# Patient Record
Sex: Female | Born: 1965 | Hispanic: No | Marital: Married | Smoking: Never smoker
Health system: Southern US, Community
[De-identification: ages and names within clinical notes are randomized; demographics above are authoritative.]

## PROBLEM LIST (undated history)

## (undated) DIAGNOSIS — B019 Varicella without complication: Secondary | ICD-10-CM

## (undated) DIAGNOSIS — T7840XA Allergy, unspecified, initial encounter: Secondary | ICD-10-CM

## (undated) DIAGNOSIS — G43909 Migraine, unspecified, not intractable, without status migrainosus: Secondary | ICD-10-CM

## (undated) DIAGNOSIS — R7303 Prediabetes: Secondary | ICD-10-CM

## (undated) DIAGNOSIS — Z9289 Personal history of other medical treatment: Secondary | ICD-10-CM

## (undated) DIAGNOSIS — E785 Hyperlipidemia, unspecified: Secondary | ICD-10-CM

## (undated) DIAGNOSIS — E039 Hypothyroidism, unspecified: Secondary | ICD-10-CM

## (undated) DIAGNOSIS — I10 Essential (primary) hypertension: Secondary | ICD-10-CM

## (undated) DIAGNOSIS — D649 Anemia, unspecified: Secondary | ICD-10-CM

## (undated) DIAGNOSIS — E079 Disorder of thyroid, unspecified: Secondary | ICD-10-CM

## (undated) HISTORY — DX: Migraine, unspecified, not intractable, without status migrainosus: G43.909

## (undated) HISTORY — DX: Hyperlipidemia, unspecified: E78.5

## (undated) HISTORY — DX: Allergy, unspecified, initial encounter: T78.40XA

## (undated) HISTORY — PX: TUBAL LIGATION: SHX77

## (undated) HISTORY — DX: Personal history of other medical treatment: Z92.89

## (undated) HISTORY — DX: Varicella without complication: B01.9

## (undated) HISTORY — PX: PORT-A-CATH REMOVAL: SHX5289

## (undated) HISTORY — DX: Disorder of thyroid, unspecified: E07.9

## (undated) HISTORY — PX: HERNIA REPAIR: SHX51

---

## 2002-01-11 ENCOUNTER — Other Ambulatory Visit: Admission: RE | Admit: 2002-01-11 | Discharge: 2002-01-11 | Payer: Self-pay | Admitting: Internal Medicine

## 2004-12-22 ENCOUNTER — Other Ambulatory Visit: Admission: RE | Admit: 2004-12-22 | Discharge: 2004-12-22 | Payer: Self-pay | Admitting: Internal Medicine

## 2010-10-01 HISTORY — PX: OTHER SURGICAL HISTORY: SHX169

## 2010-10-01 HISTORY — PX: ABDOMINOPLASTY: SUR9

## 2010-11-03 DIAGNOSIS — K429 Umbilical hernia without obstruction or gangrene: Secondary | ICD-10-CM

## 2010-11-13 ENCOUNTER — Encounter (INDEPENDENT_AMBULATORY_CARE_PROVIDER_SITE_OTHER): Payer: Self-pay | Admitting: Surgery

## 2010-11-17 ENCOUNTER — Encounter (INDEPENDENT_AMBULATORY_CARE_PROVIDER_SITE_OTHER): Payer: 59 | Admitting: Surgery

## 2010-11-19 ENCOUNTER — Ambulatory Visit (INDEPENDENT_AMBULATORY_CARE_PROVIDER_SITE_OTHER): Payer: 59 | Admitting: Surgery

## 2010-11-19 ENCOUNTER — Encounter (INDEPENDENT_AMBULATORY_CARE_PROVIDER_SITE_OTHER): Payer: Self-pay | Admitting: Surgery

## 2010-11-19 VITALS — BP 128/80 | HR 68

## 2010-11-19 DIAGNOSIS — K429 Umbilical hernia without obstruction or gangrene: Secondary | ICD-10-CM

## 2010-11-19 NOTE — Progress Notes (Signed)
Visit Diagnoses: 1. Umbilical hernia     HISTORY: Patient returns for followup having undergone primary repair of umbilical hernia at the time of abdominoplasty   EXAM: Surgical wounds appear to be healing without complication. There is mild soft tissue edema. There is no sign of cellulitis. With Valsalva in a standing and the recumbent position there is no sign of recurrent hernia.   IMPRESSION: Status post primary repair of umbilical hernia concurrent with abdominoplasty   PLAN: Patient will remain out of work for one additional week. She has a followup appointment scheduled with her plastic surgeon on November 25, 2010. At that time he will determine the date of return to work.  Patient will return to see me as needed   Velora Heckler, MD, FACS General & Endocrine Surgery [facility]

## 2010-11-19 NOTE — Patient Instructions (Signed)
Remain out of work until re-assessed by Dr. Benna Dunks on September 18th.  TMG

## 2011-01-01 ENCOUNTER — Encounter (INDEPENDENT_AMBULATORY_CARE_PROVIDER_SITE_OTHER): Payer: Self-pay

## 2011-01-01 ENCOUNTER — Telehealth (INDEPENDENT_AMBULATORY_CARE_PROVIDER_SITE_OTHER): Payer: Self-pay

## 2011-01-01 NOTE — Telephone Encounter (Signed)
Patient given RTW letter with no restrictions. Patient stated her  Plastic Surgeon has also approved RTW with  no restrictions.

## 2011-11-05 ENCOUNTER — Ambulatory Visit (INDEPENDENT_AMBULATORY_CARE_PROVIDER_SITE_OTHER): Payer: 59 | Admitting: Family Medicine

## 2011-11-05 ENCOUNTER — Encounter: Payer: Self-pay | Admitting: Family Medicine

## 2011-11-05 VITALS — BP 120/82 | HR 81 | Temp 98.1°F | Ht 59.5 in | Wt 109.2 lb

## 2011-11-05 DIAGNOSIS — F419 Anxiety disorder, unspecified: Secondary | ICD-10-CM | POA: Insufficient documentation

## 2011-11-05 DIAGNOSIS — F411 Generalized anxiety disorder: Secondary | ICD-10-CM

## 2011-11-05 DIAGNOSIS — R000 Tachycardia, unspecified: Secondary | ICD-10-CM | POA: Insufficient documentation

## 2011-11-05 DIAGNOSIS — G4700 Insomnia, unspecified: Secondary | ICD-10-CM

## 2011-11-05 DIAGNOSIS — E785 Hyperlipidemia, unspecified: Secondary | ICD-10-CM

## 2011-11-05 MED ORDER — SERTRALINE HCL 25 MG PO TABS: 25.0000 mg | ORAL_TABLET | Freq: Every day | ORAL | Status: DC

## 2011-11-05 NOTE — Progress Notes (Signed)
  Subjective:    Patient ID: Dana Kramer, female    DOB: December 23, 1965, 46 y.o.   MRN: [id number]  HPI New to establish.  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  Tachycardia- chronic problem, had normal cardiac w/u, normal lab work.  No obvious cause.  Was started on Metoprolol w/ good results.  Hyperlipidemia- pt w/ hx of elevated cholesterol readings.  Is controlling w/ diet and exercise, fish oil.  Now exercising 4x/week for 2 hrs at a time.  Insomnia- father passed away 1 yr ago from esophageal cancer.  Pt admits to increased stress levels.  When she lies down at night thoughts will start race.  'unable to turn off my brain'.  Previously took [place] but was unable to sleep soundly through the night.  Took Ambien but fears dependency on med.   Review of Systems For ROS see HPI     Objective:   Physical Exam  Vitals reviewed. Constitutional: She is oriented to person, place, and time. She appears well-developed and well-nourished. No distress.  HENT:  Head: Normocephalic and atraumatic.  Eyes: Conjunctivae and EOM are normal. Pupils are equal, round, and reactive to light.  Neck: Normal range of motion. Neck supple. No thyromegaly present.  Cardiovascular: Normal rate, regular rhythm, normal heart sounds and intact distal pulses.   No murmur heard. Pulmonary/Chest: Effort normal and breath sounds normal. No respiratory distress.  Abdominal: Soft. She exhibits no distension. There is no tenderness.  Musculoskeletal: She exhibits no edema.  Lymphadenopathy:    She has no cervical adenopathy.  Neurological: She is alert and oriented to person, place, and time.  Skin: Skin is warm and dry.  Psychiatric: She has a normal mood and affect. Her behavior is normal.          Assessment & Plan:

## 2011-11-05 NOTE — Assessment & Plan Note (Signed)
New to provider.  Pt is controlling w/ healthy diet, regular exercise, and fish oil.  Will draw labs at upcoming CPE.

## 2011-11-05 NOTE — Assessment & Plan Note (Signed)
New to provider, ongoing for pt.  Well controlled on metoprolol.  Currently asymptomatic.  No changes.

## 2011-11-05 NOTE — Assessment & Plan Note (Signed)
New.  Pt reports this is ongoing issue for her.  Fearful of taking meds but realizes she needs something due to insomnia.  Will start low dose Zoloft and follow closely.  Pt expressed understanding and is in agreement w/ plan.

## 2011-11-05 NOTE — Assessment & Plan Note (Signed)
New.  Ongoing problem for pt.  Pt did well on Ambien but fears possibility of dependence.  Will start Zoloft to tx anxiety and ideally this will improve insomnia.  Will follow closely and adjust meds prn.

## 2011-11-05 NOTE — Patient Instructions (Addendum)
Schedule your complete physical in 4-6 weeks Start the Zoloft daily at dinner Call with any questions or concerns Think of [place] as your home base Welcome!  We're glad to have you!!!

## 2011-11-25 ENCOUNTER — Telehealth: Payer: Self-pay | Admitting: Family Medicine

## 2011-11-25 NOTE — Telephone Encounter (Signed)
refills mail order [facility] x 2 --- last ov 8.29.13 to [facility]  1-Sertraline HCl (Tab) ZOLOFT 25 MG Take 1 tablet (25 mg total) by mouth daily.  2-Metoprolol Tartrate (Tab) LOPRESSOR 12.5 mg Take by mouth daily.

## 2011-11-26 MED ORDER — METOPROLOL TARTRATE 12.5 MG HALF TABLET: 12.5000 mg | ORAL_TABLET | Freq: Every day | ORAL | Status: DC

## 2011-11-26 MED ORDER — SERTRALINE HCL 25 MG PO TABS: 25.0000 mg | ORAL_TABLET | Freq: Every day | ORAL | Status: DC

## 2011-11-26 NOTE — Telephone Encounter (Signed)
rx sent to pharmacy by e-script  

## 2011-12-01 ENCOUNTER — Telehealth: Payer: Self-pay | Admitting: Family Medicine

## 2011-12-01 NOTE — Telephone Encounter (Signed)
Pt states the pharmacy told her there is some confusion about one of her prescriptions and they were unable to contact us. Pt states we should [phone] and give verification # [id number].

## 2011-12-01 NOTE — Telephone Encounter (Signed)
Called pt to advise that a form has been received and that MD Beverely Low has been out of the office today, however the form is waiting on her desk to be completed and will be faxed when completed, pt understood

## 2011-12-02 NOTE — Telephone Encounter (Signed)
Pt noted she is taking 12.5mg  of metoprolol DAILY and also needs refills on her zoloft [facility], form given back to MD Tabori with verified information, will await MD Tabori notation

## 2011-12-04 NOTE — Telephone Encounter (Signed)
Pt states she needs her rx for metoprolol to be for 25mg  1/2 tablet daily. The pharmacy will not accept it written as 12.5mg .

## 2011-12-04 NOTE — Telephone Encounter (Signed)
Called pt to advise the corrected amount has been faxed to her pharmacy for the 25mg  to be given half tab, pt understood and noted that the zoloft has already been mailed according to mail order

## 2011-12-08 ENCOUNTER — Other Ambulatory Visit (HOSPITAL_COMMUNITY)
Admission: RE | Admit: 2011-12-08 | Discharge: 2011-12-08 | Disposition: A | Discharge status: Home / Self Care | Payer: 59 | Source: Ambulatory Visit | Attending: Family Medicine | Admitting: Family Medicine

## 2011-12-08 ENCOUNTER — Ambulatory Visit (INDEPENDENT_AMBULATORY_CARE_PROVIDER_SITE_OTHER): Payer: 59 | Admitting: Family Medicine

## 2011-12-08 ENCOUNTER — Encounter: Payer: Self-pay | Admitting: Family Medicine

## 2011-12-08 VITALS — BP 104/78 | HR 75 | Temp 97.8°F | Ht 59.5 in | Wt 109.0 lb

## 2011-12-08 DIAGNOSIS — Z01419 Encounter for gynecological examination (general) (routine) without abnormal findings: Secondary | ICD-10-CM | POA: Insufficient documentation

## 2011-12-08 DIAGNOSIS — E785 Hyperlipidemia, unspecified: Secondary | ICD-10-CM

## 2011-12-08 DIAGNOSIS — Z124 Encounter for screening for malignant neoplasm of cervix: Secondary | ICD-10-CM | POA: Insufficient documentation

## 2011-12-08 LAB — CBC WITH DIFFERENTIAL/PLATELET
Basophils Relative: 0.5 % (ref 0.0–3.0)
Eosinophils Relative: 0.9 % (ref 0.0–5.0)
Hemoglobin: 12.5 g/dL (ref 12.0–15.0)
Lymphocytes Relative: 39 % (ref 12.0–46.0)
MCHC: 33.1 g/dL (ref 30.0–36.0)
MCV: 87.8 fl (ref 78.0–100.0)
Neutro Abs: 3.1 10*3/uL (ref 1.4–7.7)
Neutrophils Relative %: 56.1 % (ref 43.0–77.0)
RBC: 4.3 Mil/uL (ref 3.87–5.11)
WBC: 5.5 10*3/uL (ref 4.5–10.5)

## 2011-12-08 LAB — BASIC METABOLIC PANEL
BUN: 16 mg/dL (ref 6–23)
Chloride: 102 mEq/L (ref 96–112)
Creatinine, Ser: 0.7 mg/dL (ref 0.4–1.2)
Glucose, Bld: 74 mg/dL (ref 70–99)
Potassium: 3.7 mEq/L (ref 3.5–5.1)

## 2011-12-08 LAB — HEPATIC FUNCTION PANEL
AST: 23 U/L (ref 0–37)
Alkaline Phosphatase: 52 U/L (ref 39–117)
Bilirubin, Direct: 0.1 mg/dL (ref 0.0–0.3)
Total Bilirubin: 0.7 mg/dL (ref 0.3–1.2)

## 2011-12-08 LAB — LIPID PANEL
HDL: 51.6 mg/dL (ref >39.00)
VLDL: 15 mg/dL (ref 0.0–40.0)

## 2011-12-08 LAB — TSH: TSH: 2.91 u[IU]/mL (ref 0.35–5.50)

## 2011-12-08 NOTE — Assessment & Plan Note (Signed)
Pt's PE WNL.  UTD on mammo.  Check labs.  Anticipatory guidance provided.  

## 2011-12-08 NOTE — Assessment & Plan Note (Signed)
Check labs.  Adjust meds prn  

## 2011-12-08 NOTE — Patient Instructions (Addendum)
Follow up in 6 months to recheck cholesterol We'll notify you of your lab results and make any changes if needed Keep up the good work!  You look great! Call with any questions or concerns Happy Belated Birthday! 

## 2011-12-08 NOTE — Progress Notes (Signed)
  Subjective:    Patient ID: Dana Kramer, female    DOB: October 09, 1965, 46 y.o.   MRN: [id number]  HPI CPE- no concerns today.   Review of Systems Patient reports no vision/ hearing changes, adenopathy,fever, weight change,  persistant/recurrent hoarseness , swallowing issues, chest pain, palpitations, edema, persistant/recurrent cough, hemoptysis, dyspnea (rest/exertional/paroxysmal nocturnal), gastrointestinal bleeding (melena, rectal bleeding), abdominal pain, significant heartburn, bowel changes, GU symptoms (dysuria, hematuria, incontinence), Gyn symptoms (abnormal  bleeding, pain),  syncope, focal weakness, memory loss, numbness & tingling, skin/hair/nail changes, abnormal bruising or bleeding.  Anxiety/depression- pt admits to sleeping better, feels mood has improved.     Objective:   Physical Exam  General Appearance:    Alert, cooperative, no distress, appears stated age  Head:    Normocephalic, without obvious abnormality, atraumatic  Eyes:    PERRL, conjunctiva/corneas clear, EOM's intact, fundi    benign, both eyes  Ears:    Normal TM's and external ear canals, both ears  Nose:   Nares normal, septum midline, mucosa normal, no drainage    or sinus tenderness  Throat:   Lips, mucosa, and tongue normal; teeth and gums normal  Neck:   Supple, symmetrical, trachea midline, no adenopathy;    Thyroid: no enlargement/tenderness/nodules  Back:     Symmetric, no curvature, ROM normal, no CVA tenderness  Lungs:     Clear to auscultation bilaterally, respirations unlabored  Chest Wall:    No tenderness or deformity   Heart:    Regular rate and rhythm, S1 and S2 normal, no murmur, rub   or gallop  Breast Exam:    No tenderness, masses, or nipple abnormality  Abdomen:     Soft, non-tender, bowel sounds active all four quadrants,    no masses, no organomegaly  Genitalia:    External genitalia normal, cervix normal in appearance, no CMT, uterus in normal size and position, adnexa w/out mass  or tenderness, mucosa pink and moist, no lesions or discharge present  Rectal:    Normal external appearance  Extremities:   Extremities normal, atraumatic, no cyanosis or edema  Pulses:   2+ and symmetric all extremities  Skin:   Skin color, texture, turgor normal, no rashes or lesions  Lymph nodes:   Cervical, supraclavicular, and axillary nodes normal  Neurologic:   CNII-XII intact, normal strength, sensation and reflexes    throughout          Assessment & Plan:

## 2011-12-08 NOTE — Assessment & Plan Note (Signed)
Pap collected. 

## 2011-12-12 LAB — VITAMIN D 1,25 DIHYDROXY: Vitamin D 1, 25 (OH)2 Total: 109 pg/mL — ABNORMAL HIGH (ref 18–72)

## 2012-01-21 ENCOUNTER — Ambulatory Visit (INDEPENDENT_AMBULATORY_CARE_PROVIDER_SITE_OTHER): Payer: 59 | Admitting: Family Medicine

## 2012-01-21 VITALS — BP 120/76 | HR 81 | Temp 98.2°F | Wt 113.0 lb

## 2012-01-21 DIAGNOSIS — H10029 Other mucopurulent conjunctivitis, unspecified eye: Secondary | ICD-10-CM

## 2012-01-21 DIAGNOSIS — J209 Acute bronchitis, unspecified: Secondary | ICD-10-CM | POA: Insufficient documentation

## 2012-01-21 MED ORDER — GUAIFENESIN-CODEINE 100-10 MG/5ML PO SYRP: 10.0000 mL | ORAL_SOLUTION | Freq: Three times a day (TID) | ORAL | Status: DC | PRN

## 2012-01-21 MED ORDER — AZITHROMYCIN 250 MG PO TABS: ORAL_TABLET | ORAL | Status: DC

## 2012-01-21 MED ORDER — POLYMYXIN B-TRIMETHOPRIM 10000-0.1 UNIT/ML-% OP SOLN: OPHTHALMIC | Status: DC

## 2012-01-21 NOTE — Patient Instructions (Addendum)
This is a bronchitis and pink eye Start the Zpack as directed Use the eye drops for 1 week Cough syrup as needed Call with any questions or concerns Hang in there!

## 2012-01-21 NOTE — Progress Notes (Signed)
  Subjective:    Patient ID: Dana Kramer, female    DOB: 10/08/1965, 46 y.o.   MRN: [id number]  HPI Pink eye- R eye very red since yesterday.  Not itchy.  + AM drainage w/ crusting.  No eye pain, pressure, or gritty feeling.  No eye drainage during the day.  + coughing- coughing fits resulting in post-tussive emesis.  No fever.  + nasal congestion, R sinus pain and HA.   Review of Systems For ROS see HPI     Objective:   Physical Exam  Constitutional: She appears well-developed and well-nourished. No distress.  HENT:  Head: Normocephalic and atraumatic.       TMs normal bilaterally Mild nasal congestion Throat w/out erythema, edema, or exudate  Eyes: EOM are normal. Pupils are equal, round, and reactive to light.       R eye injected, tearing w/out evidence of foreign body.  No TTP over globe  Neck: Normal range of motion. Neck supple.  Cardiovascular: Normal rate, regular rhythm, normal heart sounds and intact distal pulses.   No murmur heard. Pulmonary/Chest: Effort normal and breath sounds normal. No respiratory distress. She has no wheezes.       + hacking cough  Lymphadenopathy:    She has no cervical adenopathy.          Assessment & Plan:

## 2012-01-31 NOTE — Assessment & Plan Note (Signed)
New.  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  Cough meds prn.  Reviewed supportive care and red flags that should prompt return.  Pt expressed understanding and is in agreement w/ plan.

## 2012-01-31 NOTE — Assessment & Plan Note (Signed)
New.  Start abx eye drops so that pt can return to work.  Note provided.  Reviewed supportive care and red flags that should prompt return.  Pt expressed understanding and is in agreement w/ plan.

## 2012-03-24 ENCOUNTER — Encounter: Payer: Self-pay | Admitting: Family Medicine

## 2012-04-23 ENCOUNTER — Other Ambulatory Visit: Payer: Self-pay

## 2012-06-07 ENCOUNTER — Encounter: Payer: Self-pay | Admitting: Family Medicine

## 2012-06-07 ENCOUNTER — Ambulatory Visit (INDEPENDENT_AMBULATORY_CARE_PROVIDER_SITE_OTHER): Payer: 59 | Admitting: Family Medicine

## 2012-06-07 VITALS — BP 110/80 | HR 65 | Temp 97.6°F | Ht 60.25 in | Wt 115.8 lb

## 2012-06-07 DIAGNOSIS — E785 Hyperlipidemia, unspecified: Secondary | ICD-10-CM

## 2012-06-07 LAB — BASIC METABOLIC PANEL
CO2: 26 mEq/L (ref 19–32)
Calcium: 8.8 mg/dL (ref 8.4–10.5)
Glucose, Bld: 79 mg/dL (ref 70–99)
Potassium: 4.2 mEq/L (ref 3.5–5.1)
Sodium: 136 mEq/L (ref 135–145)

## 2012-06-07 LAB — LIPID PANEL: Triglycerides: 149 mg/dL (ref 0.0–149.0)

## 2012-06-07 LAB — HEPATIC FUNCTION PANEL
ALT: 12 U/L (ref 0–35)
Bilirubin, Direct: 0.1 mg/dL (ref 0.0–0.3)
Total Protein: 7.1 g/dL (ref 6.0–8.3)

## 2012-06-07 NOTE — Assessment & Plan Note (Signed)
Chronic problem.  Pt has been able to control w/ diet and exercise.  Labs were fantastic at last check.  Repeat today.

## 2012-06-07 NOTE — Progress Notes (Signed)
  Subjective:    Patient ID: Dana Kramer, female    DOB: [birth year], 47 y.o.   MRN: [id number]  HPI Hyperlipidemia- reported problem at [facility], last labs showed excellent control.  Working out regularly.  On Fish oil daily.  No N/V/D, abdominal pain.  Myalgias.  No CP, SOB, HAs, visual changes.   Review of Systems For ROS see HPI     Objective:   Physical Exam  Vitals reviewed. Constitutional: She is oriented to person, place, and time. She appears well-developed and well-nourished. No distress.  HENT:  Head: Normocephalic and atraumatic.  Eyes: Conjunctivae and EOM are normal. Pupils are equal, round, and reactive to light.  Neck: Normal range of motion. Neck supple. No thyromegaly present.  Cardiovascular: Normal rate, regular rhythm, normal heart sounds and intact distal pulses.   No murmur heard. Pulmonary/Chest: Effort normal and breath sounds normal. No respiratory distress.  Abdominal: Soft. She exhibits no distension. There is no tenderness.  Musculoskeletal: She exhibits no edema.  Lymphadenopathy:    She has no cervical adenopathy.  Neurological: She is alert and oriented to person, place, and time.  Skin: Skin is warm and dry.  Psychiatric: She has a normal mood and affect. Her behavior is normal.          Assessment & Plan:

## 2012-06-07 NOTE — Patient Instructions (Addendum)
Schedule your complete physical for October You look great!  Keep up the good work! We'll notify you of your lab results Call with any questions or concerns Happy Spring!!!

## 2012-06-08 ENCOUNTER — Encounter: Payer: Self-pay | Admitting: Family Medicine

## 2012-06-09 MED ORDER — SIMVASTATIN 20 MG PO TABS: 20.0000 mg | ORAL_TABLET | Freq: Every day | ORAL | Status: DC

## 2012-06-09 NOTE — Telephone Encounter (Signed)
Please advise.//AB/CMA 

## 2012-07-30 ENCOUNTER — Other Ambulatory Visit: Payer: Self-pay | Admitting: Family Medicine

## 2012-08-02 NOTE — Telephone Encounter (Signed)
Med filled.  

## 2012-12-09 ENCOUNTER — Telehealth: Payer: Self-pay

## 2012-12-09 NOTE — Telephone Encounter (Signed)
LM for CB  HM UTD WE flu and T-dap vaccines [facility]?

## 2012-12-12 ENCOUNTER — Encounter: Payer: Self-pay | Admitting: Family Medicine

## 2012-12-12 ENCOUNTER — Ambulatory Visit (INDEPENDENT_AMBULATORY_CARE_PROVIDER_SITE_OTHER): Payer: 59 | Admitting: Family Medicine

## 2012-12-12 VITALS — BP 130/80 | HR 85 | Temp 98.2°F | Resp 16 | Ht 60.0 in | Wt 121.0 lb

## 2012-12-12 DIAGNOSIS — Z01419 Encounter for gynecological examination (general) (routine) without abnormal findings: Secondary | ICD-10-CM

## 2012-12-12 DIAGNOSIS — Z0000 Encounter for general adult medical examination without abnormal findings: Secondary | ICD-10-CM

## 2012-12-12 DIAGNOSIS — D235 Other benign neoplasm of skin of trunk: Secondary | ICD-10-CM

## 2012-12-12 DIAGNOSIS — Z23 Encounter for immunization: Secondary | ICD-10-CM

## 2012-12-12 DIAGNOSIS — D225 Melanocytic nevi of trunk: Secondary | ICD-10-CM

## 2012-12-12 DIAGNOSIS — Z1331 Encounter for screening for depression: Secondary | ICD-10-CM

## 2012-12-12 LAB — HEPATIC FUNCTION PANEL
AST: 21 U/L (ref 0–37)
Albumin: 4.1 g/dL (ref 3.5–5.2)
Total Bilirubin: 0.6 mg/dL (ref 0.3–1.2)

## 2012-12-12 LAB — LIPID PANEL
Cholesterol: 152 mg/dL (ref 0–200)
HDL: 43.3 mg/dL (ref >39.00)
Triglycerides: 119 mg/dL (ref 0.0–149.0)

## 2012-12-12 LAB — BASIC METABOLIC PANEL
BUN: 14 mg/dL (ref 6–23)
Calcium: 8.8 mg/dL (ref 8.4–10.5)
Creatinine, Ser: 0.7 mg/dL (ref 0.4–1.2)
GFR: 99.43 mL/min (ref >60.00)
Glucose, Bld: 90 mg/dL (ref 70–99)

## 2012-12-12 LAB — CBC WITH DIFFERENTIAL/PLATELET
Basophils Relative: 0.5 % (ref 0.0–3.0)
Eosinophils Absolute: 0.1 10*3/uL (ref 0.0–0.7)
Eosinophils Relative: 1.3 % (ref 0.0–5.0)
HCT: 37.1 % (ref 36.0–46.0)
Lymphs Abs: 1.8 10*3/uL (ref 0.7–4.0)
MCHC: 34.3 g/dL (ref 30.0–36.0)
MCV: 84.6 fl (ref 78.0–100.0)
Monocytes Absolute: 0.2 10*3/uL (ref 0.1–1.0)
Neutro Abs: 3 10*3/uL (ref 1.4–7.7)
RBC: 4.38 Mil/uL (ref 3.87–5.11)
WBC: 5.2 10*3/uL (ref 4.5–10.5)

## 2012-12-12 LAB — TSH: TSH: 2.4 u[IU]/mL (ref 0.35–5.50)

## 2012-12-12 NOTE — Assessment & Plan Note (Signed)
Pt's PE WNL w/ exception of atypical nevus on abdominal wall.  Referral to derm placed.  Check labs.  Encouraged pt to schedule mammo at her convenience.  Anticipatory guidance provided.

## 2012-12-12 NOTE — Patient Instructions (Addendum)
Follow up in 6 months to recheck cholesterol We'll notify you of your lab results and make any changes if needed Call and schedule your mammogram! We'll call you with your Dermatology appt Call with any questions or concerns Happy Belated Birthday!!!

## 2012-12-12 NOTE — Telephone Encounter (Signed)
Unable to reach prior to visit  

## 2012-12-12 NOTE — Progress Notes (Signed)
  Subjective:    Patient ID: Dana Kramer, female    DOB: 1965-04-30, 47 y.o.   MRN: [id number]  HPI CPE- UTD on pap, due for mammo.  Wants Tdap and flu today.   Review of Systems Patient reports no vision/ hearing changes, adenopathy,fever, weight change,  persistant/recurrent hoarseness , swallowing issues, chest pain, palpitations, edema, persistant/recurrent cough, hemoptysis, dyspnea (rest/exertional/paroxysmal nocturnal), gastrointestinal bleeding (melena, rectal bleeding), abdominal pain, significant heartburn, bowel changes, GU symptoms (dysuria, hematuria, incontinence), Gyn symptoms (abnormal  bleeding, pain),  syncope, focal weakness, memory loss, numbness & tingling, skin/hair/nail changes, abnormal bruising or bleeding, anxiety, or depression.     Objective:   Physical Exam General Appearance:    Alert, cooperative, no distress, appears stated age  Head:    Normocephalic, without obvious abnormality, atraumatic  Eyes:    PERRL, conjunctiva/corneas clear, EOM's intact, fundi    benign, both eyes  Ears:    Normal TM's and external ear canals, both ears  Nose:   Nares normal, septum midline, mucosa normal, no drainage    or sinus tenderness  Throat:   Lips, mucosa, and tongue normal; teeth and gums normal  Neck:   Supple, symmetrical, trachea midline, no adenopathy;    Thyroid: no enlargement/tenderness/nodules  Back:     Symmetric, no curvature, ROM normal, no CVA tenderness  Lungs:     Clear to auscultation bilaterally, respirations unlabored  Chest Wall:    No tenderness or deformity   Heart:    Regular rate and rhythm, S1 and S2 normal, no murmur, rub   or gallop  Breast Exam:    Deferred to mammo  Abdomen:     Soft, non-tender, bowel sounds active all four quadrants,    no masses, no organomegaly  Genitalia:    Deferred  Rectal:    Extremities:   Extremities normal, atraumatic, no cyanosis or edema  Pulses:   2+ and symmetric all extremities  Skin:   Skin color, texture,  turgor normal, no rashes or lesions.  1 cm mole on abdominal wall w/ central hyperpigmentation  Lymph nodes:   Cervical, supraclavicular, and axillary nodes normal  Neurologic:   CNII-XII intact, normal strength, sensation and reflexes    throughout          Assessment & Plan:

## 2012-12-14 LAB — VITAMIN D 1,25 DIHYDROXY: Vitamin D 1, 25 (OH)2 Total: 111 pg/mL — ABNORMAL HIGH (ref 18–72)

## 2013-01-09 ENCOUNTER — Telehealth: Payer: Self-pay | Admitting: Family Medicine

## 2013-01-09 MED ORDER — SERTRALINE HCL 25 MG PO TABS: ORAL_TABLET | ORAL | Status: DC

## 2013-01-09 MED ORDER — METOPROLOL TARTRATE 12.5 MG HALF TABLET: 12.5000 mg | ORAL_TABLET | Freq: Every day | ORAL | Status: DC

## 2013-01-09 MED ORDER — SIMVASTATIN 20 MG PO TABS: 20.0000 mg | ORAL_TABLET | Freq: Every day | ORAL | Status: DC

## 2013-01-09 NOTE — Telephone Encounter (Signed)
Med filled.  

## 2013-01-09 NOTE — Telephone Encounter (Signed)
Pt last seen on 10/6.  Zoloft filled 07-30-12 #90 with 1 Simvastatin filled  06-09-12 390 with 3 Metoprolol filled 11-26-11 #90 with 2

## 2013-01-09 NOTE — Telephone Encounter (Signed)
Ok to refill all for 90 day supply w/ 3 refills

## 2013-01-09 NOTE — Telephone Encounter (Signed)
Patient called and stated that on her last visit with dr Beverely Low in September her medications were [facility].   sertraline (ZOLOFT) 25 MG tablet  simvastatin (ZOCOR) 20 MG tablet  And metoprolol tartrate (LOPRESSOR) 12.5 mg TABS

## 2013-01-11 ENCOUNTER — Other Ambulatory Visit: Payer: Self-pay | Admitting: General Practice

## 2013-01-11 MED ORDER — METOPROLOL SUCCINATE ER 25 MG PO TB24: 12.5000 mg | ORAL_TABLET | Freq: Every day | ORAL | Status: DC

## 2013-01-14 ENCOUNTER — Other Ambulatory Visit: Payer: Self-pay | Admitting: Family Medicine

## 2013-01-16 NOTE — Telephone Encounter (Signed)
Med filled.  

## 2013-06-06 ENCOUNTER — Telehealth: Payer: Self-pay | Admitting: Family Medicine

## 2013-06-06 MED ORDER — SERTRALINE HCL 25 MG PO TABS: ORAL_TABLET | ORAL | Status: DC

## 2013-06-06 NOTE — Telephone Encounter (Signed)
Last OV 12/2012 Med filled 01/09/13 #90 with 1

## 2013-06-06 NOTE — Telephone Encounter (Signed)
Patient called requesting zoloft 25 mg 90 day supply be sent to [facility].

## 2013-06-06 NOTE — Telephone Encounter (Signed)
[place] for #90 w/ 1

## 2013-06-06 NOTE — Telephone Encounter (Signed)
Med filled.  

## 2013-12-09 ENCOUNTER — Other Ambulatory Visit: Payer: Self-pay | Admitting: Family Medicine

## 2013-12-11 NOTE — Telephone Encounter (Signed)
Med filled and letter mailed to pt to schedule appt.

## 2013-12-26 ENCOUNTER — Other Ambulatory Visit: Payer: Self-pay | Admitting: Family Medicine

## 2013-12-26 NOTE — Telephone Encounter (Signed)
Med filled.  

## 2014-01-11 ENCOUNTER — Ambulatory Visit (INDEPENDENT_AMBULATORY_CARE_PROVIDER_SITE_OTHER): Payer: 59 | Admitting: Family Medicine

## 2014-01-11 ENCOUNTER — Encounter: Payer: Self-pay | Admitting: Family Medicine

## 2014-01-11 VITALS — BP 122/78 | HR 86 | Temp 98.2°F | Resp 16 | Ht 60.5 in | Wt 117.4 lb

## 2014-01-11 DIAGNOSIS — Z01419 Encounter for gynecological examination (general) (routine) without abnormal findings: Secondary | ICD-10-CM

## 2014-01-11 DIAGNOSIS — Z1231 Encounter for screening mammogram for malignant neoplasm of breast: Secondary | ICD-10-CM

## 2014-01-11 MED ORDER — SIMVASTATIN 20 MG PO TABS: ORAL_TABLET | ORAL | Status: DC

## 2014-01-11 MED ORDER — METOPROLOL SUCCINATE ER 25 MG PO TB24: 12.5000 mg | ORAL_TABLET | Freq: Every day | ORAL | Status: DC

## 2014-01-11 MED ORDER — SERTRALINE HCL 25 MG PO TABS: ORAL_TABLET | ORAL | Status: DC

## 2014-01-11 NOTE — Patient Instructions (Signed)
Follow up in 6 months to recheck cholesterol We'll notify you of your lab results and make any changes if needed Keep up the good work on healthy diet and regular exercise Call with any questions or concerns Happy Holidays!

## 2014-01-11 NOTE — Progress Notes (Signed)
   Subjective:    Patient ID: Dana Kramer, female    DOB: [birth year], 48 y.o.   MRN: [id number]  HPI CPE- UTD on pap, due for mammo [facility]).  Too young for colonoscopy.  No concerns   Review of Systems Patient reports no vision/ hearing changes, adenopathy,fever, weight change,  persistant/recurrent hoarseness , swallowing issues, chest pain, palpitations, edema, persistant/recurrent cough, hemoptysis, dyspnea (rest/exertional/paroxysmal nocturnal), gastrointestinal bleeding (melena, rectal bleeding), abdominal pain, significant heartburn, bowel changes, GU symptoms (dysuria, hematuria, incontinence), Gyn symptoms (abnormal  bleeding, pain),  syncope, focal weakness, memory loss, numbness & tingling, skin/hair/nail changes, abnormal bruising or bleeding, anxiety, or depression.     Objective:   Physical Exam General Appearance:    Alert, cooperative, no distress, appears stated age  Head:    Normocephalic, without obvious abnormality, atraumatic  Eyes:    PERRL, conjunctiva/corneas clear, EOM's intact, fundi    benign, both eyes  Ears:    Normal TM's and external ear canals, both ears  Nose:   Nares normal, septum midline, mucosa normal, no drainage    or sinus tenderness  Throat:   Lips, mucosa, and tongue normal; teeth and gums normal  Neck:   Supple, symmetrical, trachea midline, no adenopathy;    Thyroid: no enlargement/tenderness/nodules  Back:     Symmetric, no curvature, ROM normal, no CVA tenderness  Lungs:     Clear to auscultation bilaterally, respirations unlabored  Chest Wall:    No tenderness or deformity   Heart:    Regular rate and rhythm, S1 and S2 normal, no murmur, rub   or gallop  Breast Exam:    Deferred to mammo  Abdomen:     Soft, non-tender, bowel sounds active all four quadrants,    no masses, no organomegaly  Genitalia:    Deferred  Rectal:    Extremities:   Extremities normal, atraumatic, no cyanosis or edema  Pulses:   2+ and symmetric all extremities  Skin:    Skin color, texture, turgor normal, no rashes or lesions  Lymph nodes:   Cervical, supraclavicular, and axillary nodes normal  Neurologic:   CNII-XII intact, normal strength, sensation and reflexes    throughout          Assessment & Plan:

## 2014-01-11 NOTE — Assessment & Plan Note (Addendum)
Pt's PE WNL.  Due for mammo- order entered.  UTD on pap.  Too young for colonoscopy.  Check labs.  Anticipatory guidance provided.

## 2014-01-11 NOTE — Progress Notes (Signed)
Pre visit review using our clinic review tool, if applicable. No additional management support is needed unless otherwise documented below in the visit note. 

## 2014-01-12 LAB — CBC WITH DIFFERENTIAL/PLATELET
BASOS ABS: 0 10*3/uL (ref 0.0–0.1)
BASOS PCT: 0.4 % (ref 0.0–3.0)
EOS ABS: 0.2 10*3/uL (ref 0.0–0.7)
Eosinophils Relative: 1.8 % (ref 0.0–5.0)
HEMATOCRIT: 42 % (ref 36.0–46.0)
Hemoglobin: 13.7 g/dL (ref 12.0–15.0)
LYMPHS ABS: 2.7 10*3/uL (ref 0.7–4.0)
Lymphocytes Relative: 32 % (ref 12.0–46.0)
MCHC: 32.6 g/dL (ref 30.0–36.0)
MCV: 86.3 fl (ref 78.0–100.0)
Monocytes Absolute: 0.3 10*3/uL (ref 0.1–1.0)
Monocytes Relative: 3.6 % (ref 3.0–12.0)
Neutro Abs: 5.2 10*3/uL (ref 1.4–7.7)
Neutrophils Relative %: 62.2 % (ref 43.0–77.0)
Platelets: 212 10*3/uL (ref 150.0–400.0)
RBC: 4.87 Mil/uL (ref 3.87–5.11)
RDW: 12.7 % (ref 11.5–15.5)
WBC: 8.4 10*3/uL (ref 4.0–10.5)

## 2014-01-12 LAB — HEPATIC FUNCTION PANEL
ALK PHOS: 48 U/L (ref 39–117)
ALT: 13 U/L (ref 0–35)
AST: 25 U/L (ref 0–37)
Albumin: 4 g/dL (ref 3.5–5.2)
BILIRUBIN DIRECT: 0.1 mg/dL (ref 0.0–0.3)
BILIRUBIN TOTAL: 0.7 mg/dL (ref 0.2–1.2)
Total Protein: 7.7 g/dL (ref 6.0–8.3)

## 2014-01-12 LAB — LIPID PANEL
CHOL/HDL RATIO: 3
Cholesterol: 136 mg/dL (ref 0–200)
HDL: 39.6 mg/dL (ref >39.00)
LDL CALC: 78 mg/dL (ref 0–99)
NonHDL: 96.4
TRIGLYCERIDES: 94 mg/dL (ref 0.0–149.0)
VLDL: 18.8 mg/dL (ref 0.0–40.0)

## 2014-01-12 LAB — BASIC METABOLIC PANEL
BUN: 13 mg/dL (ref 6–23)
CALCIUM: 9.3 mg/dL (ref 8.4–10.5)
CO2: 26 mEq/L (ref 19–32)
Chloride: 104 mEq/L (ref 96–112)
Creatinine, Ser: 0.8 mg/dL (ref 0.4–1.2)
GFR: 85.73 mL/min (ref >60.00)
GLUCOSE: 61 mg/dL — AB (ref 70–99)
Potassium: 4 mEq/L (ref 3.5–5.1)
Sodium: 137 mEq/L (ref 135–145)

## 2014-01-12 LAB — TSH: TSH: 2.1 u[IU]/mL (ref 0.35–4.50)

## 2014-01-12 LAB — VITAMIN D 25 HYDROXY (VIT D DEFICIENCY, FRACTURES): VITD: 24.75 ng/mL — ABNORMAL LOW (ref 30.00–100.00)

## 2014-01-16 ENCOUNTER — Encounter: Payer: 59 | Admitting: Family Medicine

## 2014-02-06 LAB — HM MAMMOGRAPHY

## 2014-02-13 ENCOUNTER — Encounter: Payer: Self-pay | Admitting: General Practice

## 2014-07-11 ENCOUNTER — Ambulatory Visit (INDEPENDENT_AMBULATORY_CARE_PROVIDER_SITE_OTHER): Payer: 59 | Admitting: Family Medicine

## 2014-07-11 ENCOUNTER — Encounter: Payer: Self-pay | Admitting: Family Medicine

## 2014-07-11 VITALS — BP 120/80 | HR 70 | Temp 97.9°F | Resp 16 | Wt 120.0 lb

## 2014-07-11 DIAGNOSIS — E785 Hyperlipidemia, unspecified: Secondary | ICD-10-CM | POA: Diagnosis not present

## 2014-07-11 LAB — LIPID PANEL
Cholesterol: 130 mg/dL (ref 0–200)
HDL: 42.1 mg/dL (ref >39.00)
LDL Cholesterol: 71 mg/dL (ref 0–99)
NONHDL: 87.9
Total CHOL/HDL Ratio: 3
Triglycerides: 83 mg/dL (ref 0.0–149.0)
VLDL: 16.6 mg/dL (ref 0.0–40.0)

## 2014-07-11 LAB — BASIC METABOLIC PANEL
BUN: 15 mg/dL (ref 6–23)
CHLORIDE: 102 meq/L (ref 96–112)
CO2: 31 mEq/L (ref 19–32)
CREATININE: 0.77 mg/dL (ref 0.40–1.20)
Calcium: 9.2 mg/dL (ref 8.4–10.5)
GFR: 85.55 mL/min (ref >60.00)
Glucose, Bld: 86 mg/dL (ref 70–99)
Potassium: 4.1 mEq/L (ref 3.5–5.1)
Sodium: 136 mEq/L (ref 135–145)

## 2014-07-11 LAB — HEPATIC FUNCTION PANEL
ALBUMIN: 4.2 g/dL (ref 3.5–5.2)
ALT: 8 U/L (ref 0–35)
AST: 20 U/L (ref 0–37)
Alkaline Phosphatase: 40 U/L (ref 39–117)
BILIRUBIN TOTAL: 0.7 mg/dL (ref 0.2–1.2)
Bilirubin, Direct: 0.1 mg/dL (ref 0.0–0.3)
Total Protein: 7.1 g/dL (ref 6.0–8.3)

## 2014-07-11 NOTE — Progress Notes (Signed)
   Subjective:    Patient ID: Dana Kramer, female    DOB: 05/14/1965, 49 y.o.   MRN: [id number]  HPI Hyperlipidemia- chronic problem, on Simvastatin.  No CP, SOB, HAs, visual changes, abd pain, N/V, edema.  Exercising regularly.  Eating healthy diet.  Very compliant w/ medication.  No family hx that pt is aware of.   Review of Systems For ROS see HPI     Objective:   Physical Exam  Constitutional: She is oriented to person, place, and time. She appears well-developed and well-nourished. No distress.  HENT:  Head: Normocephalic and atraumatic.  Eyes: Conjunctivae and EOM are normal. Pupils are equal, round, and reactive to light.  Neck: Normal range of motion. Neck supple. No thyromegaly present.  Cardiovascular: Normal rate, regular rhythm, normal heart sounds and intact distal pulses.   No murmur heard. Pulmonary/Chest: Effort normal and breath sounds normal. No respiratory distress.  Abdominal: Soft. She exhibits no distension. There is no tenderness.  Musculoskeletal: She exhibits no edema.  Lymphadenopathy:    She has no cervical adenopathy.  Neurological: She is alert and oriented to person, place, and time.  Skin: Skin is warm and dry.  Psychiatric: She has a normal mood and affect. Her behavior is normal.  Vitals reviewed.         Assessment & Plan:

## 2014-07-11 NOTE — Progress Notes (Signed)
Pre visit review using our clinic review tool, if applicable. No additional management support is needed unless otherwise documented below in the visit note. 

## 2014-07-11 NOTE — Patient Instructions (Signed)
Schedule your complete physical in 6 months We'll notify you of your lab results and make any changes if needed Keep up the good work!  You look great! Call with any questions or concerns Happy Mother's Day!!!

## 2014-07-11 NOTE — Assessment & Plan Note (Signed)
Chronic problem.  Tolerating statin w/o difficulty.  Applauded healthy food choices and regular exercise, as well as med compliance.  Check labs.  Adjust meds prn

## 2015-01-17 ENCOUNTER — Telehealth: Payer: Self-pay | Admitting: Behavioral Health

## 2015-01-17 ENCOUNTER — Encounter: Payer: Self-pay | Admitting: Behavioral Health

## 2015-01-17 NOTE — Telephone Encounter (Signed)
Pre-Visit Call completed with patient and chart updated.   Pre-Visit Info documented in Specialty Comments under [employer].    

## 2015-01-18 ENCOUNTER — Encounter: Payer: Self-pay | Admitting: Family Medicine

## 2015-01-18 ENCOUNTER — Ambulatory Visit (INDEPENDENT_AMBULATORY_CARE_PROVIDER_SITE_OTHER): Payer: 59 | Admitting: Family Medicine

## 2015-01-18 VITALS — BP 118/80 | HR 81 | Temp 98.0°F | Resp 16 | Ht 61.0 in | Wt 119.5 lb

## 2015-01-18 DIAGNOSIS — Z0000 Encounter for general adult medical examination without abnormal findings: Secondary | ICD-10-CM

## 2015-01-18 DIAGNOSIS — Z01419 Encounter for gynecological examination (general) (routine) without abnormal findings: Secondary | ICD-10-CM

## 2015-01-18 DIAGNOSIS — G43009 Migraine without aura, not intractable, without status migrainosus: Secondary | ICD-10-CM

## 2015-01-18 DIAGNOSIS — G43909 Migraine, unspecified, not intractable, without status migrainosus: Secondary | ICD-10-CM | POA: Insufficient documentation

## 2015-01-18 LAB — BASIC METABOLIC PANEL
BUN: 13 mg/dL (ref 6–23)
CO2: 31 mEq/L (ref 19–32)
CREATININE: 0.76 mg/dL (ref 0.40–1.20)
Calcium: 9.8 mg/dL (ref 8.4–10.5)
Chloride: 102 mEq/L (ref 96–112)
GFR: 86.65 mL/min (ref >60.00)
Glucose, Bld: 78 mg/dL (ref 70–99)
POTASSIUM: 4.3 meq/L (ref 3.5–5.1)
Sodium: 139 mEq/L (ref 135–145)

## 2015-01-18 LAB — CBC WITH DIFFERENTIAL/PLATELET
Basophils Absolute: 0 10*3/uL (ref 0.0–0.1)
Basophils Relative: 0.6 % (ref 0.0–3.0)
EOS PCT: 2 % (ref 0.0–5.0)
Eosinophils Absolute: 0.1 10*3/uL (ref 0.0–0.7)
HEMATOCRIT: 38.7 % (ref 36.0–46.0)
HEMOGLOBIN: 12.9 g/dL (ref 12.0–15.0)
LYMPHS PCT: 32.8 % (ref 12.0–46.0)
Lymphs Abs: 2.1 10*3/uL (ref 0.7–4.0)
MCHC: 33.2 g/dL (ref 30.0–36.0)
MCV: 85.9 fl (ref 78.0–100.0)
MONOS PCT: 5.5 % (ref 3.0–12.0)
Monocytes Absolute: 0.4 10*3/uL (ref 0.1–1.0)
Neutro Abs: 3.8 10*3/uL (ref 1.4–7.7)
Neutrophils Relative %: 59.1 % (ref 43.0–77.0)
Platelets: 212 10*3/uL (ref 150.0–400.0)
RBC: 4.5 Mil/uL (ref 3.87–5.11)
RDW: 12.5 % (ref 11.5–15.5)
WBC: 6.5 10*3/uL (ref 4.0–10.5)

## 2015-01-18 LAB — LIPID PANEL
CHOL/HDL RATIO: 3
Cholesterol: 136 mg/dL (ref 0–200)
HDL: 40.4 mg/dL (ref >39.00)
LDL CALC: 71 mg/dL (ref 0–99)
NONHDL: 95.14
TRIGLYCERIDES: 122 mg/dL (ref 0.0–149.0)
VLDL: 24.4 mg/dL (ref 0.0–40.0)

## 2015-01-18 LAB — VITAMIN D 25 HYDROXY (VIT D DEFICIENCY, FRACTURES): VITD: 60.22 ng/mL (ref 30.00–100.00)

## 2015-01-18 LAB — TSH: TSH: 2.48 u[IU]/mL (ref 0.35–4.50)

## 2015-01-18 LAB — HEPATIC FUNCTION PANEL
ALBUMIN: 4.3 g/dL (ref 3.5–5.2)
ALT: 8 U/L (ref 0–35)
AST: 19 U/L (ref 0–37)
Alkaline Phosphatase: 47 U/L (ref 39–117)
BILIRUBIN TOTAL: 0.6 mg/dL (ref 0.2–1.2)
Bilirubin, Direct: 0.1 mg/dL (ref 0.0–0.3)
Total Protein: 6.9 g/dL (ref 6.0–8.3)

## 2015-01-18 MED ORDER — SUMATRIPTAN SUCCINATE 50 MG PO TABS: 50.0000 mg | ORAL_TABLET | Freq: Once | ORAL | Status: DC

## 2015-01-18 NOTE — Progress Notes (Signed)
   Subjective:    Patient ID: Dana Kramer, female    DOB: 11-23-1965, 49 y.o.   MRN: [id number]  HPI CPE- UTD on mammo, due for pap but pt declines today.  No concerns today.   Review of Systems Patient reports no vision/ hearing changes, adenopathy,fever, weight change,  persistant/recurrent hoarseness , swallowing issues, chest pain, palpitations, edema, persistant/recurrent cough, hemoptysis, dyspnea (rest/exertional/paroxysmal nocturnal), gastrointestinal bleeding (melena, rectal bleeding), abdominal pain, significant heartburn, bowel changes, GU symptoms (dysuria, hematuria, incontinence), Gyn symptoms (abnormal  bleeding, pain),  syncope, focal weakness, memory loss, numbness & tingling, skin/hair/nail changes, abnormal bruising or bleeding, anxiety, or depression.   + migraines- pt has never tried Imitrex.  Tends to occur w/ seasonal pressure change.  HAs are unilateral, + photophobia, phonophobia.    Objective:   Physical Exam General Appearance:    Alert, cooperative, no distress, appears stated age  Head:    Normocephalic, without obvious abnormality, atraumatic  Eyes:    PERRL, conjunctiva/corneas clear, EOM's intact, fundi    benign, both eyes  Ears:    Normal TM's and external ear canals, both ears  Nose:   Nares normal, septum midline, mucosa normal, no drainage    or sinus tenderness  Throat:   Lips, mucosa, and tongue normal; teeth and gums normal  Neck:   Supple, symmetrical, trachea midline, no adenopathy;    Thyroid: no enlargement/tenderness/nodules  Back:     Symmetric, no curvature, ROM normal, no CVA tenderness  Lungs:     Clear to auscultation bilaterally, respirations unlabored  Chest Wall:    No tenderness or deformity   Heart:    Regular rate and rhythm, S1 and S2 normal, no murmur, rub   or gallop  Breast Exam:    Deferred to mammo  Abdomen:     Soft, non-tender, bowel sounds active all four quadrants,    no masses, no organomegaly  Genitalia:    Deferred at  pt's request  Rectal:    Extremities:   Extremities normal, atraumatic, no cyanosis or edema  Pulses:   2+ and symmetric all extremities  Skin:   Skin color, texture, turgor normal, no rashes or lesions  Lymph nodes:   Cervical, supraclavicular, and axillary nodes normal  Neurologic:   CNII-XII intact, normal strength, sensation and reflexes    throughout          Assessment & Plan:

## 2015-01-18 NOTE — Progress Notes (Signed)
Pre visit review using our clinic review tool, if applicable. No additional management support is needed unless otherwise documented below in the visit note. 

## 2015-01-18 NOTE — Assessment & Plan Note (Signed)
New.  Pt reports unilateral, throbbing HAs w/ photo and phonophobia.  Discussed use of Excedrin Migraine.  If no improvement, pt to use Imitrex.  Will also start daily Claritin/Zyrtec for allergy component.  Reviewed supportive care and red flags that should prompt return.  Pt expressed understanding and is in agreement w/ plan.

## 2015-01-18 NOTE — Patient Instructions (Addendum)
Follow up in 6 months to recheck cholesterol We'll notify you of your lab results and make any changes if needed Keep up the good work on healthy diet and regular exercise- you look great!!! Schedule your mammogram for next month (December) At the first sign of headache, take 2 excedrin migraine.  If no improvement in 20-30 minutes, take the Imitrex. Start Claritin or Zyrtec daily for the allergy component Call with any questions or concerns If you want to join [place] at the new [place] office, any scheduled appointments will automatically transfer and we will see you at [address] [place] [address] [place], [place] [address]  Happy Holidays!!!

## 2015-01-18 NOTE — Assessment & Plan Note (Signed)
Pt's PE WNL.  UTD on mammo, due for pap- pt declines today.  UTD on flu.  Check labs.  Anticipatory guidance provided.

## 2015-01-28 ENCOUNTER — Telehealth: Payer: Self-pay | Admitting: Family Medicine

## 2015-01-28 MED ORDER — SIMVASTATIN 20 MG PO TABS: ORAL_TABLET | ORAL | Status: DC

## 2015-01-28 MED ORDER — SERTRALINE HCL 25 MG PO TABS: ORAL_TABLET | ORAL | Status: DC

## 2015-01-28 MED ORDER — METOPROLOL SUCCINATE ER 25 MG PO TB24: 12.5000 mg | ORAL_TABLET | Freq: Every day | ORAL | Status: DC

## 2015-01-28 NOTE — Telephone Encounter (Signed)
Pharmacy: [facility]  Reason for call: Pt needing zoloft, zocor, and metoprolol. They are all expired. She said that we need to send only 1 90 day supply as her pharmacy provider is supposed to change in January and she'll have to order thru a different company.

## 2015-01-28 NOTE — Telephone Encounter (Signed)
Medication filled to pharmacy as requested.   

## 2015-01-31 ENCOUNTER — Other Ambulatory Visit: Payer: Self-pay | Admitting: Family Medicine

## 2015-02-04 MED ORDER — SIMVASTATIN 20 MG PO TABS: ORAL_TABLET | ORAL | Status: DC

## 2015-02-04 MED ORDER — METOPROLOL SUCCINATE ER 25 MG PO TB24: 12.5000 mg | ORAL_TABLET | Freq: Every day | ORAL | Status: DC

## 2015-02-04 MED ORDER — SERTRALINE HCL 25 MG PO TABS: ORAL_TABLET | ORAL | Status: DC

## 2015-02-04 NOTE — Telephone Encounter (Signed)
Medication filled to pharmacy as requested.   

## 2015-04-27 ENCOUNTER — Other Ambulatory Visit: Payer: Self-pay | Admitting: Family Medicine

## 2015-04-29 NOTE — Telephone Encounter (Signed)
Medication filled to pharmacy as requested.   

## 2015-05-04 ENCOUNTER — Other Ambulatory Visit: Payer: Self-pay | Admitting: Family Medicine

## 2015-05-06 NOTE — Telephone Encounter (Signed)
Medication filled to pharmacy as requested.   

## 2015-06-27 ENCOUNTER — Other Ambulatory Visit: Payer: Self-pay | Admitting: Family Medicine

## 2015-06-28 ENCOUNTER — Telehealth: Payer: Self-pay | Admitting: Family Medicine

## 2015-06-28 ENCOUNTER — Encounter: Payer: Self-pay | Admitting: Family Medicine

## 2015-06-28 MED ORDER — SIMVASTATIN 20 MG PO TABS: 20.0000 mg | ORAL_TABLET | Freq: Every day | ORAL | Status: DC

## 2015-06-28 MED ORDER — SERTRALINE HCL 25 MG PO TABS: 25.0000 mg | ORAL_TABLET | Freq: Every day | ORAL | Status: DC

## 2015-06-28 MED ORDER — METOPROLOL SUCCINATE ER 25 MG PO TB24: 12.5000 mg | ORAL_TABLET | Freq: Every day | ORAL | Status: DC

## 2015-06-28 NOTE — Telephone Encounter (Signed)
Medication filled to pharmacy as requested.   

## 2015-06-28 NOTE — Telephone Encounter (Signed)
Pt needs refills on metoprolol, zoloft, and zocor, pt states that she now uses [facility].

## 2015-12-18 ENCOUNTER — Encounter: Payer: Self-pay | Admitting: Family Medicine

## 2015-12-18 ENCOUNTER — Ambulatory Visit (INDEPENDENT_AMBULATORY_CARE_PROVIDER_SITE_OTHER): Payer: BLUE CROSS/BLUE SHIELD | Admitting: Family Medicine

## 2015-12-18 VITALS — BP 150/100 | HR 67 | Temp 98.5°F | Ht 60.0 in | Wt 124.0 lb

## 2015-12-18 DIAGNOSIS — Z23 Encounter for immunization: Secondary | ICD-10-CM

## 2015-12-18 DIAGNOSIS — Z87898 Personal history of other specified conditions: Secondary | ICD-10-CM | POA: Diagnosis not present

## 2015-12-18 DIAGNOSIS — E785 Hyperlipidemia, unspecified: Secondary | ICD-10-CM | POA: Diagnosis not present

## 2015-12-18 DIAGNOSIS — R030 Elevated blood-pressure reading, without diagnosis of hypertension: Secondary | ICD-10-CM

## 2015-12-18 DIAGNOSIS — F411 Generalized anxiety disorder: Secondary | ICD-10-CM

## 2015-12-18 MED ORDER — SERTRALINE HCL 50 MG PO TABS: 50.0000 mg | ORAL_TABLET | Freq: Every day | ORAL | 1 refills | Status: DC

## 2015-12-18 MED ORDER — SIMVASTATIN 20 MG PO TABS: 20.0000 mg | ORAL_TABLET | Freq: Every day | ORAL | 3 refills | Status: DC

## 2015-12-18 MED ORDER — SERTRALINE HCL 50 MG PO TABS: 50.0000 mg | ORAL_TABLET | Freq: Every day | ORAL | 3 refills | Status: DC

## 2015-12-18 NOTE — Progress Notes (Signed)
[place] at [facility] [address] [place], [place] [address] 336 [id number] [phone]  Date:  12/18/2015   Name:  Dana Kramer   DOB:  May 22, 1965   MRN:  [id number]  PCP:  Lamar Blinks, MD    Chief Complaint: Establish Care (Former pt of Dr. Birdie Riddle. Pt here to est care. Would like flu vaccine today. Will need med refills. )   History of Present Illness:  Dana Kramer is a 50 y.o. very pleasant female patient who presents with the following:  Lasts visit with Dr. Birdie Riddle about one year ago. Here today to establish care with me as Dr. Hassie Bruce as moved, and have flu shot History of hyperlipidemia.  She takes metoprolol for tachycardia.  She did have elevated BP in the past but never really dx with HTN per her report She will get heart palp with too much coffee, otherwise no CP, SOB or heart palpitation   She stopped her zoloft about 2 weeks ago because she ran out- she would like to go back on this medication. She has been taking it for several years. She notes that her "mind will race"at night and that she may have a hard time getting to sleep. She is otherwise not feeling depressed.  She had noted some increased sx even while on the zoloft and would like to try increasing her dose Last labs a year ago  Pap 2013- negative but HPV not done,  Due for pap this year  She has been feeling well overall and did not suspect that her BP would be up.  However she has noted some symptoms from stopping zoloft suddenly and wonders if this may be why her BP is high today.   She is not having any CP or SOB.  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 Migraine 01/18/2015  . Bronchitis, acute 01/21/2012  . Pink eye 01/21/2012  . Screening for malignant neoplasm of the cervix 12/08/2011  . Encounter for routine gynecological examination 12/08/2011  . Insomnia 11/05/2011  . Anxiety 11/05/2011  . Hyperlipidemia 11/05/2011  . Tachycardia 11/05/2011  . Umbilical hernia 0000000    Past Medical History:  Diagnosis Date  . Chicken pox   . History of transfusion of whole blood   . Hyperlipidemia   . Migraine     Past Surgical History:  Procedure Laterality Date  . ABDOMINOPLASTY  10/01/2010  . CESAREAN SECTION     twice  . umblica hernia repair  [id number]    Social History  Substance Use Topics  . Smoking status: Never Smoker  . Smokeless tobacco: Not on file  . Alcohol use No    Family History  Problem Relation Age of Onset  . Hypertension Mother   . Cancer Father     Allergies  Allergen Reactions  . Penicillins Rash    Mainly upper body only.    Medication list has been reviewed and updated.  Current Outpatient Prescriptions on File Prior to Visit  Medication Sig Dispense Refill  . metoprolol succinate (TOPROL-XL) 25 MG  24 hr tablet Take 0.5 tablets (12.5 mg total) by mouth daily. 90 tablet 1  . sertraline (ZOLOFT) 25 MG tablet Take 1 tablet (25 mg total) by mouth daily. 90 tablet 1  . simvastatin (ZOCOR) 20 MG tablet Take 1 tablet (20 mg total) by mouth at bedtime. 90 tablet 1   No current facility-administered medications on file prior to visit.     Review of Systems:  As per HPI- otherwise negative.   Physical Examination: Vitals:   12/18/15 1327 12/18/15 1330  BP: (!) 158/99 (!) 149/104  Pulse: 67   Temp: 98.5 F (36.9 C)    Vitals:   12/18/15 1327  Weight: 124 lb (56.2 kg)  Height: 5' (1.524 m)   Body mass index is 24.22 kg/m. Ideal Body Weight: Weight in (lb) to have BMI = 25: 127.7  GEN: WDWN, NAD, Non-toxic, A & O x 3, petite build, looks  well HEENT: Atraumatic, Normocephalic. Neck supple. No masses, No LAD. Ears and Nose: No external deformity. CV: RRR, No M/G/R. No JVD. No thrill. No extra heart sounds. PULM: CTA B, no wheezes, crackles, rhonchi. No retractions. No resp. distress. No accessory muscle use. ABD: S, NT, ND. No rebound. No HSM. EXTR: No c/c/e NEURO Normal gait.  PSYCH: Normally interactive. Conversant. Not depressed or anxious appearing.  Calm demeanor.    Assessment and Plan: Elevated blood pressure reading  Encounter for immunization - Plan: Flu Vaccine QUAD 36+ mos IM, simvastatin (ZOCOR) 20 MG tablet, sertraline (ZOLOFT) 50 MG tablet  History of tachycardia  Dyslipidemia - Plan: simvastatin (ZOCOR) 20 MG tablet  GAD (generalized anxiety disorder) - Plan: sertraline (ZOLOFT) 50 MG tablet  Immunization due - Plan: Flu Vaccine QUAD 36+ mos IM   Here today to establish care Her BP is high today- this may be due to stopping zoloft suddenly or other factors.  She will monitor her BP and will let me know if it remains high.  May increase her metoprolol to 25 mg/d while she gets back on the zoloft. Will increase her zoloft to 50 mg due to mild breakthrough sx of anxiety, more so at night.   Flu shot today Refilled her zocor Plan for a CPE/ labs/ pap in the next couple of months   Signed Lamar Blinks, MD

## 2015-12-18 NOTE — Patient Instructions (Addendum)
Start back on your zoloft- I think this may decrease your blood pressure.  We will increase your zoloft to 50 mg in hopes of better controlling your nighttime symptoms!    Please keep an eye on your blood pressure over the next several days.  If you continue to get readings higher than 140/90 please let me know.  In that case we can increase your metoprolol.  If you would like to increase your metoprolol to 25 mg for a few days while you get back ok the zoloft that would be ok

## 2015-12-18 NOTE — Progress Notes (Signed)
Pre visit review using our clinic review tool, if applicable. No additional management support is needed unless otherwise documented below in the visit note. 

## 2015-12-25 ENCOUNTER — Other Ambulatory Visit: Payer: Self-pay | Admitting: Family Medicine

## 2015-12-25 ENCOUNTER — Other Ambulatory Visit: Payer: Self-pay | Admitting: Emergency Medicine

## 2015-12-25 DIAGNOSIS — F411 Generalized anxiety disorder: Secondary | ICD-10-CM

## 2015-12-25 DIAGNOSIS — E785 Hyperlipidemia, unspecified: Secondary | ICD-10-CM

## 2015-12-25 NOTE — Telephone Encounter (Signed)
Relation to [id number] Call back [phone] Pharmacy: [place], [place] to [facility]istered [facility]  Reason for call:  Patient states mail order never received sertraline (ZOLOFT) 50 MG tablet and simvastatin (ZOCOR) 20 MG tablet prescription

## 2015-12-26 ENCOUNTER — Other Ambulatory Visit: Payer: Self-pay | Admitting: Emergency Medicine

## 2015-12-26 NOTE — Telephone Encounter (Signed)
Called pt to inform her that rx was sent on 10/11. Per pt the pharmacy told her they never received the rx. Pt provided me with a number to give a verbal order for her refills. Called [phone], gave verbal order to the pharmacist for sertraline (Zoloft) 50 MG tablet and simvastatin (Zocor) 20 MG tablet.

## 2016-01-02 ENCOUNTER — Telehealth: Payer: Self-pay | Admitting: Family Medicine

## 2016-01-02 NOTE — Telephone Encounter (Signed)
Error

## 2016-01-02 NOTE — Telephone Encounter (Signed)
Pt says that she need a 90 day supply on medication refill.

## 2016-01-03 ENCOUNTER — Encounter: Payer: Self-pay | Admitting: Family Medicine

## 2016-01-03 ENCOUNTER — Other Ambulatory Visit: Payer: Self-pay | Admitting: Family Medicine

## 2016-01-03 DIAGNOSIS — F411 Generalized anxiety disorder: Secondary | ICD-10-CM

## 2016-01-03 MED ORDER — SIMVASTATIN 20 MG PO TABS: 20.0000 mg | ORAL_TABLET | Freq: Every day | ORAL | 3 refills | Status: DC

## 2016-01-03 MED ORDER — SERTRALINE HCL 50 MG PO TABS: 50.0000 mg | ORAL_TABLET | Freq: Every day | ORAL | 0 refills | Status: DC

## 2016-01-03 NOTE — Addendum Note (Signed)
Addended by: Dorrene German on: 01/03/2016 11:00 AM   Modules accepted: Orders

## 2016-01-03 NOTE — Telephone Encounter (Addendum)
Medication resent to pharmacy as requested.  

## 2016-01-06 ENCOUNTER — Other Ambulatory Visit: Payer: Self-pay | Admitting: Family Medicine

## 2016-01-06 DIAGNOSIS — F411 Generalized anxiety disorder: Secondary | ICD-10-CM

## 2016-01-06 MED ORDER — SERTRALINE HCL 50 MG PO TABS: 50.0000 mg | ORAL_TABLET | Freq: Every day | ORAL | 3 refills | Status: DC

## 2016-02-25 ENCOUNTER — Encounter: Payer: Self-pay | Admitting: Family Medicine

## 2016-02-26 MED ORDER — METOPROLOL SUCCINATE ER 25 MG PO TB24: 12.5000 mg | ORAL_TABLET | Freq: Every day | ORAL | 1 refills | Status: DC

## 2016-04-13 ENCOUNTER — Other Ambulatory Visit (HOSPITAL_COMMUNITY)
Admission: RE | Admit: 2016-04-13 | Discharge: 2016-04-13 | Disposition: A | Discharge status: Home / Self Care | Payer: BLUE CROSS/BLUE SHIELD | Source: Ambulatory Visit | Attending: Family Medicine | Admitting: Family Medicine

## 2016-04-13 ENCOUNTER — Ambulatory Visit (INDEPENDENT_AMBULATORY_CARE_PROVIDER_SITE_OTHER): Payer: BLUE CROSS/BLUE SHIELD | Admitting: Family Medicine

## 2016-04-13 ENCOUNTER — Other Ambulatory Visit: Payer: Self-pay | Admitting: Family Medicine

## 2016-04-13 ENCOUNTER — Encounter: Payer: Self-pay | Admitting: Family Medicine

## 2016-04-13 VITALS — BP 135/92 | HR 85 | Temp 98.1°F | Ht 60.0 in | Wt 120.8 lb

## 2016-04-13 DIAGNOSIS — E782 Mixed hyperlipidemia: Secondary | ICD-10-CM

## 2016-04-13 DIAGNOSIS — Z1151 Encounter for screening for human papillomavirus (HPV): Secondary | ICD-10-CM | POA: Diagnosis present

## 2016-04-13 DIAGNOSIS — Z0000 Encounter for general adult medical examination without abnormal findings: Secondary | ICD-10-CM | POA: Diagnosis not present

## 2016-04-13 DIAGNOSIS — Z131 Encounter for screening for diabetes mellitus: Secondary | ICD-10-CM | POA: Diagnosis not present

## 2016-04-13 DIAGNOSIS — Z130 Encounter for screening for diseases of the blood and blood-forming organs and certain disorders involving the immune mechanism: Secondary | ICD-10-CM

## 2016-04-13 DIAGNOSIS — Z01419 Encounter for gynecological examination (general) (routine) without abnormal findings: Secondary | ICD-10-CM | POA: Insufficient documentation

## 2016-04-13 DIAGNOSIS — Z124 Encounter for screening for malignant neoplasm of cervix: Secondary | ICD-10-CM

## 2016-04-13 DIAGNOSIS — Z1231 Encounter for screening mammogram for malignant neoplasm of breast: Secondary | ICD-10-CM

## 2016-04-13 DIAGNOSIS — Z1329 Encounter for screening for other suspected endocrine disorder: Secondary | ICD-10-CM | POA: Diagnosis not present

## 2016-04-13 LAB — LIPID PANEL
CHOL/HDL RATIO: 3
Cholesterol: 156 mg/dL (ref 0–200)
HDL: 45.9 mg/dL (ref >39.00)
LDL CALC: 87 mg/dL (ref 0–99)
NONHDL: 109.6
Triglycerides: 112 mg/dL (ref 0.0–149.0)
VLDL: 22.4 mg/dL (ref 0.0–40.0)

## 2016-04-13 LAB — CBC
HEMATOCRIT: 38.8 % (ref 36.0–46.0)
HEMOGLOBIN: 13.2 g/dL (ref 12.0–15.0)
MCHC: 34.1 g/dL (ref 30.0–36.0)
MCV: 86 fl (ref 78.0–100.0)
Platelets: 224 10*3/uL (ref 150.0–400.0)
RBC: 4.51 Mil/uL (ref 3.87–5.11)
RDW: 12.1 % (ref 11.5–15.5)
WBC: 9.3 10*3/uL (ref 4.0–10.5)

## 2016-04-13 LAB — COMPREHENSIVE METABOLIC PANEL
ALT: 8 U/L (ref 0–35)
AST: 18 U/L (ref 0–37)
Albumin: 4.5 g/dL (ref 3.5–5.2)
Alkaline Phosphatase: 49 U/L (ref 39–117)
BUN: 11 mg/dL (ref 6–23)
CHLORIDE: 102 meq/L (ref 96–112)
CO2: 29 meq/L (ref 19–32)
Calcium: 9.2 mg/dL (ref 8.4–10.5)
Creatinine, Ser: 0.7 mg/dL (ref 0.40–1.20)
GFR: 94.78 mL/min (ref >60.00)
GLUCOSE: 76 mg/dL (ref 70–99)
POTASSIUM: 3.9 meq/L (ref 3.5–5.1)
Sodium: 136 mEq/L (ref 135–145)
Total Bilirubin: 0.7 mg/dL (ref 0.2–1.2)
Total Protein: 7.3 g/dL (ref 6.0–8.3)

## 2016-04-13 LAB — HEMOGLOBIN A1C: Hgb A1c MFr Bld: 5.7 % (ref 4.6–6.5)

## 2016-04-13 LAB — TSH: TSH: 4.8 u[IU]/mL — ABNORMAL HIGH (ref 0.35–4.50)

## 2016-04-13 NOTE — Progress Notes (Signed)
[place] at [facility] [address], [place], [place] [address] [id number] [phone]  Date:  04/13/2016   Name:  Dana Kramer   DOB:  14-Apr-1965   MRN:  [id number]  PCP:  Lamar Blinks, MD    Chief Complaint: Annual Exam (Pt here for CPE. Last PAP: 2013. Pt is fasting for labs. Flu vaccine done 11/2015.)   History of Present Illness:  Dana Kramer is a 51 y.o. very pleasant female patient who presents with the following:  Here today for a CPE History of anxiety, hyperlipidemia, insomnia, migraine HA Last seen by myself in October with the following HPI:  Lasts visit with Dr. Birdie Riddle about one year ago. Here today to establish care with me as Dr. Hassie Bruce as moved, and have flu shot History of hyperlipidemia.  She takes metoprolol for tachycardia.  She did have elevated BP in the past but never really dx with HTN per her report She will get heart palp with too much coffee, otherwise no CP, SOB or heart palpitation   She stopped her zoloft about 2 weeks ago because she ran out- she would like to go back on this medication. She has been taking it for several years. She notes that her "mind will race"at night and that she may have a hard time getting to sleep. She is otherwise not feeling depressed.  She had noted some increased sx even while on the zoloft and would like to try increasing her dose Last labs a year ago  Pap 2013- negative but HPV not done,  Due for pap this year  She has been feeling well overall and did not suspect that her BP would be up.  However she has noted some symptoms from stopping zoloft suddenly and wonders if this may be why her BP is high today.   She is not having any CP or SOB.  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  Has been on nights for the last 2 years.  Here from [place] (18 years)  Has a slight headache right now, thinks maybe from not having her usual coffee coffee.  Zoloft is doing good for her.  She doesn't have any worries, feels that her mood is good. Her migraines are stable and not current bothersome  Her menses are normal No CP, SOB, weight change, rash    Patient Active Problem List   Diagnosis Date Noted  . Migraine 01/18/2015  . Insomnia 11/05/2011  . Anxiety 11/05/2011  . Hyperlipidemia 11/05/2011  . Tachycardia 11/05/2011  . Umbilical hernia 0000000    Past Medical History:  Diagnosis Date  . Chicken pox   . History of transfusion of whole blood   . Hyperlipidemia   . Migraine     Past Surgical History:  Procedure Laterality Date  . ABDOMINOPLASTY  10/01/2010  . CESAREAN SECTION     twice  . umblica hernia repair  [id number]    Social History  Substance Use Topics  . Smoking status: Never Smoker  . Smokeless tobacco: Never Used  .  Alcohol use No    Family History  Problem Relation Age of Onset  . Hypertension Mother   . Cancer Father     Allergies  Allergen Reactions  . Penicillins Rash    Mainly upper body only.    Medication list has been reviewed and updated.  Current Outpatient Prescriptions on File Prior to Visit  Medication Sig Dispense Refill  . metoprolol succinate (TOPROL-XL) 25 MG 24 hr tablet Take 0.5 tablets (12.5 mg total) by mouth daily. 90 tablet 1  . sertraline (ZOLOFT) 50 MG tablet Take 1 tablet (50 mg total) by mouth daily. 90 tablet 3  . simvastatin (ZOCOR) 20 MG tablet Take 1 tablet (20 mg total) by mouth at bedtime. 90 tablet 3   No current facility-administered medications on file prior to visit.     Review of Systems:  As per HPI- otherwise negative.   Physical  Examination: Vitals:   04/13/16 1049  BP: (!) 135/92  Pulse: 85  Temp: 98.1 F (36.7 C)   Vitals:   04/13/16 1049  Weight: 120 lb 12.8 oz (54.8 kg)  Height: 5' (1.524 m)   Body mass index is 23.59 kg/m. Ideal Body Weight: Weight in (lb) to have BMI = 25: 127.7  GEN: WDWN, NAD, Non-toxic, A & O x 3, normal weight, looks well HEENT: Atraumatic, Normocephalic. Neck supple. No masses, No LAD.  Bilateral TM wnl, oropharynx normal.  PEERL,EOMI.   Ears and Nose: No external deformity. CV: RRR, No M/G/R. No JVD. No thrill. No extra heart sounds. PULM: CTA B, no wheezes, crackles, rhonchi. No retractions. No resp. distress. No accessory muscle use. ABD: S, NT, ND. No rebound. No HSM. EXTR: No c/c/e NEURO Normal gait.  PSYCH: Normally interactive. Conversant. Not depressed or anxious appearing.  Calm demeanor.  Breast: normal exam, no masses/ dimpling/ discharge Pelvic: normal, no vaginal lesions or discharge. Uterus normal, no CMT, no adnexal tendereness or masses   Assessment and Plan: Physical exam  Screening for cervical cancer - Plan: Cytology - PAP  Mixed hyperlipidemia - Plan: Lipid panel  Screening for diabetes mellitus - Plan: Comprehensive metabolic panel, Hemoglobin A1c  Screening for thyroid disorder - Plan: TSH  Screening for deficiency anemia - Plan: CBC  Here today for a CPE Pap and labs pending as above Continue current medication BP slightly high today-she will monitor at work and let me know if this persists Will plan further follow- up pending labs.   Signed Lamar Blinks, MD

## 2016-04-13 NOTE — Patient Instructions (Signed)
It was a pleasure to see you today- we will check your labs and I will be in touch with your results asap Your pap (assuming it is negative) will be good for 5 years!  Please do get a mammogram asap- this can be done at the imaging dept here at the [facility] if you like  Take care!

## 2016-04-14 ENCOUNTER — Other Ambulatory Visit: Payer: Self-pay | Admitting: Family Medicine

## 2016-04-14 ENCOUNTER — Encounter: Payer: Self-pay | Admitting: Family Medicine

## 2016-04-14 DIAGNOSIS — R7989 Other specified abnormal findings of blood chemistry: Secondary | ICD-10-CM

## 2016-04-14 LAB — CYTOLOGY - PAP
Adequacy: ABSENT
DIAGNOSIS: NEGATIVE
HPV: NOT DETECTED

## 2016-04-16 ENCOUNTER — Encounter: Payer: Self-pay | Admitting: Family Medicine

## 2016-04-19 ENCOUNTER — Encounter: Payer: Self-pay | Admitting: Family Medicine

## 2016-04-20 ENCOUNTER — Ambulatory Visit (HOSPITAL_BASED_OUTPATIENT_CLINIC_OR_DEPARTMENT_OTHER)
Admission: RE | Admit: 2016-04-20 | Discharge: 2016-04-20 | Disposition: A | Discharge status: Home / Self Care | Payer: BLUE CROSS/BLUE SHIELD | Source: Ambulatory Visit | Attending: Family Medicine | Admitting: Family Medicine

## 2016-04-20 ENCOUNTER — Other Ambulatory Visit (INDEPENDENT_AMBULATORY_CARE_PROVIDER_SITE_OTHER): Payer: BLUE CROSS/BLUE SHIELD

## 2016-04-20 ENCOUNTER — Encounter (HOSPITAL_BASED_OUTPATIENT_CLINIC_OR_DEPARTMENT_OTHER): Payer: Self-pay

## 2016-04-20 DIAGNOSIS — R946 Abnormal results of thyroid function studies: Secondary | ICD-10-CM | POA: Diagnosis not present

## 2016-04-20 DIAGNOSIS — R7989 Other specified abnormal findings of blood chemistry: Secondary | ICD-10-CM

## 2016-04-20 DIAGNOSIS — Z1231 Encounter for screening mammogram for malignant neoplasm of breast: Secondary | ICD-10-CM | POA: Diagnosis present

## 2016-04-20 LAB — T3: T3, Total: 100 ng/dL (ref 76–181)

## 2016-04-20 LAB — TSH: TSH: 3.84 u[IU]/mL (ref 0.35–4.50)

## 2016-04-20 NOTE — Addendum Note (Signed)
Addended by: Emi Holes on: 04/20/2016 08:22 AM   Modules accepted: Orders

## 2016-04-21 LAB — T4: T4, Total: 7.8 ug/dL (ref 4.5–12.0)

## 2016-08-24 ENCOUNTER — Telehealth: Payer: Self-pay | Admitting: Family Medicine

## 2016-08-24 NOTE — Telephone Encounter (Signed)
Caller name:Gwenetta Pall Relationship to patient: Can be reached:[phone] Pharmacy:  Reason for call:Needs refill on simvastain, zoloft and metoprolol. Please send to new pharmacy, [place] [phone]. My call first time.

## 2016-08-25 ENCOUNTER — Encounter: Payer: Self-pay | Admitting: Family Medicine

## 2016-08-25 ENCOUNTER — Other Ambulatory Visit: Payer: Self-pay

## 2016-08-25 DIAGNOSIS — E785 Hyperlipidemia, unspecified: Secondary | ICD-10-CM

## 2016-08-25 DIAGNOSIS — F411 Generalized anxiety disorder: Secondary | ICD-10-CM

## 2016-08-25 MED ORDER — METOPROLOL SUCCINATE ER 25 MG PO TB24: 12.5000 mg | ORAL_TABLET | Freq: Every day | ORAL | 1 refills | Status: DC

## 2016-08-25 MED ORDER — SIMVASTATIN 20 MG PO TABS: 20.0000 mg | ORAL_TABLET | Freq: Every day | ORAL | 3 refills | Status: DC

## 2016-08-25 MED ORDER — SERTRALINE HCL 50 MG PO TABS: 50.0000 mg | ORAL_TABLET | Freq: Every day | ORAL | 3 refills | Status: DC

## 2016-08-26 ENCOUNTER — Other Ambulatory Visit: Payer: Self-pay | Admitting: Emergency Medicine

## 2016-08-26 DIAGNOSIS — F411 Generalized anxiety disorder: Secondary | ICD-10-CM

## 2016-08-26 DIAGNOSIS — E785 Hyperlipidemia, unspecified: Secondary | ICD-10-CM

## 2016-08-26 MED ORDER — METOPROLOL SUCCINATE ER 25 MG PO TB24: 12.5000 mg | ORAL_TABLET | Freq: Every day | ORAL | 1 refills | Status: DC

## 2016-08-26 MED ORDER — SERTRALINE HCL 50 MG PO TABS: 50.0000 mg | ORAL_TABLET | Freq: Every day | ORAL | 3 refills | Status: DC

## 2016-08-26 MED ORDER — SIMVASTATIN 20 MG PO TABS: 20.0000 mg | ORAL_TABLET | Freq: Every day | ORAL | 3 refills | Status: DC

## 2016-08-26 NOTE — Telephone Encounter (Signed)
Simvastatin, Zoloft, and Metoprolol sent to new pharmacy, [place] per pt request.

## 2017-02-03 ENCOUNTER — Other Ambulatory Visit: Payer: Self-pay | Admitting: Emergency Medicine

## 2017-02-03 ENCOUNTER — Encounter: Payer: Self-pay | Admitting: Family Medicine

## 2017-02-03 MED ORDER — METOPROLOL SUCCINATE ER 25 MG PO TB24: 12.5000 mg | ORAL_TABLET | Freq: Every day | ORAL | 1 refills | Status: DC

## 2017-07-21 ENCOUNTER — Encounter: Payer: Managed Care, Other (non HMO) | Admitting: Family Medicine

## 2017-08-01 NOTE — Progress Notes (Addendum)
[place] at [facility] [place], [place], [place] [address] [phone]  Date:  08/04/2017   Name:  Dana Kramer   DOB:  12-10-1965   MRN:  [id number]  PCP:  Darreld Mclean, MD    Chief Complaint: Annual Exam (needing lab work, tsh)   History of Present Illness:  Dana Kramer is a 52 y.o. very pleasant female patient who presents with the following:  Here today for a CPE Last visit with me 04/2016:  Here today for a CPE History of anxiety, hyperlipidemia, insomnia, migraine HA Last seen by myself in October with the following HPI: History of hyperlipidemia. She takes metoprolol for tachycardia. She did have elevated BP in the past but never really dx with HTN per her report She will get heart palp with too much coffee, otherwise no CP, SOB or heart palpitation   She stopped her zoloft about 2 weeks ago because she ran out- she would like to go back on this medication. She has been taking it for several years. She notes that her "mind will race"at night and that she may have a hard time getting to sleep. She is otherwise not feeling depressed. She had noted some increased sx even while on the zoloft and would like to try increasing her dose Pap 2013- negative but HPV not done, Due for pap this year She has been feeling well overall and did not suspect that her BP would be up. However she has noted some symptoms from stopping zoloft suddenly and wonders if this may be why her BP is high today.  She is not having any CP or SOB. 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  Has been on nights for the last 2 years.  Here from [place] (18 years) Has a slight headache right now, thinks maybe  from not having her usual coffee coffee.  Zoloft is doing good for her.  She doesn't have any worries, feels that her mood is good. Her migraines are stable and not current bothersome  Labs: due, will get today Mammo: 2/18, neg Pap: 2/18- neg, HPV neg  Immun: UTD Colon screening will be due this year. 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- this is much closer to her home so she spends less time driving  She feels like the zoloft is working well for her today, she is able to sleep well  She is on the same dose of toprol xl daily She participates in regular exercise, mostly at the gym She never has any CP or SOB No family history of breast cancer Overall she is feeling well and has no concerns today  Wt Readings from Last 3 Encounters:  08/04/17 121 lb (54.9 kg)  04/13/16 120 lb 12.8 oz (54.8 kg)  12/18/15 124 lb (56.2 kg)    Lab Results  Component Value Date   HGBA1C 5.7 04/13/2016    BP Readings from Last 3 Encounters:  08/04/17 118/80  04/13/16 (!) 135/92  12/18/15 (!) 150/100     Lab Results  Component Value Date  TSH 3.84 04/20/2016    Patient Active Problem List   Diagnosis Date Noted  . Migraine 01/18/2015  . Insomnia 11/05/2011  . Anxiety 11/05/2011  . Hyperlipidemia 11/05/2011  . Tachycardia 11/05/2011  . Umbilical hernia 78/46/9629    Past Medical History:  Diagnosis Date  . Chicken pox   . History of transfusion of whole blood   . Hyperlipidemia   . Migraine     Past Surgical History:  Procedure Laterality Date  . ABDOMINOPLASTY  10/01/2010  . CESAREAN SECTION     twice  . umblica hernia repair  08/04/4130    Social History   Tobacco Use  . Smoking status: Never Smoker  . Smokeless tobacco: Never Used  Substance Use Topics  . Alcohol use: No  . Drug use: No    Family History  Problem Relation Age of Onset  . Hypertension Mother   .  Cancer Father     Allergies  Allergen Reactions  . Penicillins Rash    Mainly upper body only.    Medication list has been reviewed and updated.  Current Outpatient Medications on File Prior to Visit  Medication Sig Dispense Refill  . Calcium 200 MG TABS Take 1 tablet by mouth daily.    . Cholecalciferol (VITAMIN D PO) Take 1 tablet by mouth daily.    . metoprolol succinate (TOPROL-XL) 25 MG 24 hr tablet Take 0.5 tablets (12.5 mg total) by mouth daily. 90 tablet 1  . Multiple Vitamins-Minerals (WOMENS MULTIVITAMIN PO) Take 1 tablet by mouth daily.    . pantoprazole (PROTONIX) 20 MG tablet Take 20 mg by mouth daily.    . sertraline (ZOLOFT) 50 MG tablet Take 1 tablet (50 mg total) by mouth daily. 90 tablet 3  . simvastatin (ZOCOR) 20 MG tablet Take 1 tablet (20 mg total) by mouth at bedtime. 90 tablet 3   No current facility-administered medications on file prior to visit.     Review of Systems:  As per HPI- otherwise negative. No fever or chills No nausea, vomiting or diarrhea   Physical Examination: Vitals:   08/04/17 0845  BP: 118/80  Pulse: 78  Resp: 16  SpO2: 98%   Vitals:   08/04/17 0845  Weight: 121 lb (54.9 kg)  Height: 5' (1.524 m)   Body mass index is 23.63 kg/m. Ideal Body Weight: Weight in (lb) to have BMI = 25: 127.7  GEN: WDWN, NAD, Non-toxic, A & O x 3, looks well, fit and healthy. Appears younger than age  16: Atraumatic, Normocephalic. Neck supple. No masses, No LAD.  Bilateral TM wnl, oropharynx normal.  PEERL,EOMI.   Ears and Nose: No external deformity. CV: RRR, No M/G/R. No JVD. No thrill. No extra heart sounds. PULM: CTA B, no wheezes, crackles, rhonchi. No retractions. No resp. distress. No accessory muscle use. ABD: S, NT, ND, +BS. No rebound. No HSM. EXTR: No c/c/e NEURO Normal gait.  PSYCH: Normally interactive. Conversant. Not depressed or anxious appearing.  Calm demeanor.    Assessment and Plan: Physical exam  Elevated TSH -  Plan: TSH  Mixed hyperlipidemia - Plan: Lipid panel  Screening for diabetes mellitus - Plan: Comprehensive metabolic panel, Hemoglobin A1c  Screening for deficiency anemia - Plan: CBC  GAD (generalized anxiety disorder) - Plan: sertraline (ZOLOFT) 50 MG tablet  Dyslipidemia - Plan: simvastatin (ZOCOR) 20 MG tablet  Tachycardia - Plan: metoprolol succinate (TOPROL-XL) 25 MG 24 hr tablet  CPE today Discussed colon cancer screening- she will contact me after  her 52[address] so I can order cologuard for her Will plan further follow- up pending labs. Refills today Reminded to have mammo at least every 24 months   Signed Lamar Blinks, MD  Received her labs  Results for orders placed or performed in visit on 08/04/17  CBC  Result Value Ref Range   WBC 4.8 4.0 - 10.5 K/uL   RBC 4.56 3.87 - 5.11 Mil/uL   Platelets 208.0 150.0 - 400.0 K/uL   Hemoglobin 13.3 12.0 - 15.0 g/dL   HCT 39.1 36.0 - 46.0 %   MCV 85.8 78.0 - 100.0 fl   MCHC 34.0 30.0 - 36.0 g/dL   RDW 12.9 11.5 - 15.5 %  Comprehensive metabolic panel  Result Value Ref Range   Sodium 138 135 - 145 mEq/L   Potassium 5.0 3.5 - 5.1 mEq/L   Chloride 103 96 - 112 mEq/L   CO2 29 19 - 32 mEq/L   Glucose, Bld 87 70 - 99 mg/dL   BUN 7 6 - 23 mg/dL   Creatinine, Ser 0.72 0.40 - 1.20 mg/dL   Total Bilirubin 0.6 0.2 - 1.2 mg/dL   Alkaline Phosphatase 50 39 - 117 U/L   AST 16 0 - 37 U/L   ALT 7 0 - 35 U/L   Total Protein 6.9 6.0 - 8.3 g/dL   Albumin 4.3 3.5 - 5.2 g/dL   Calcium 9.9 8.4 - 10.5 mg/dL   GFR 91.25 >60.00 mL/min  Hemoglobin A1c  Result Value Ref Range   Hgb A1c MFr Bld 5.7 4.6 - 6.5 %  Lipid panel  Result Value Ref Range   Cholesterol 174 0 - 200 mg/dL   Triglycerides 155.0 (H) 0.0 - 149.0 mg/dL   HDL 47.00 >39.00 mg/dL   VLDL 31.0 0.0 - 40.0 mg/dL   LDL Cholesterol 96 0 - 99 mg/dL   Total CHOL/HDL Ratio 4    NonHDL 126.98   TSH  Result Value Ref Range   TSH 4.90 (H) 0.35 - 4.50 uIU/mL   Message to pt    Your A1c is stable, just at the pre-diabetes cut off. Please don't read too much into this as you are obviously not overweight.   We will continue to monitor annually  Cholesterol looks good Blood counts are normal Metabolic profile is normal Your TSH is high again. I suspect that your thyroid is mildly interactive as this has now happened twice. Would you be ok with starting on a low dose of thyroid replacement medication?   Pt responded that she would like to go on thyroid med rx for 25 mcg, repeat TSH in one month

## 2017-08-04 ENCOUNTER — Encounter: Payer: Self-pay | Admitting: Family Medicine

## 2017-08-04 ENCOUNTER — Ambulatory Visit (INDEPENDENT_AMBULATORY_CARE_PROVIDER_SITE_OTHER): Payer: Managed Care, Other (non HMO) | Admitting: Family Medicine

## 2017-08-04 VITALS — BP 118/80 | HR 78 | Resp 16 | Ht 60.0 in | Wt 121.0 lb

## 2017-08-04 DIAGNOSIS — R000 Tachycardia, unspecified: Secondary | ICD-10-CM

## 2017-08-04 DIAGNOSIS — E782 Mixed hyperlipidemia: Secondary | ICD-10-CM | POA: Diagnosis not present

## 2017-08-04 DIAGNOSIS — Z0000 Encounter for general adult medical examination without abnormal findings: Secondary | ICD-10-CM

## 2017-08-04 DIAGNOSIS — R7989 Other specified abnormal findings of blood chemistry: Secondary | ICD-10-CM

## 2017-08-04 DIAGNOSIS — Z130 Encounter for screening for diseases of the blood and blood-forming organs and certain disorders involving the immune mechanism: Secondary | ICD-10-CM

## 2017-08-04 DIAGNOSIS — E785 Hyperlipidemia, unspecified: Secondary | ICD-10-CM | POA: Diagnosis not present

## 2017-08-04 DIAGNOSIS — F411 Generalized anxiety disorder: Secondary | ICD-10-CM | POA: Diagnosis not present

## 2017-08-04 DIAGNOSIS — Z131 Encounter for screening for diabetes mellitus: Secondary | ICD-10-CM

## 2017-08-04 LAB — CBC
HEMATOCRIT: 39.1 % (ref 36.0–46.0)
HEMOGLOBIN: 13.3 g/dL (ref 12.0–15.0)
MCHC: 34 g/dL (ref 30.0–36.0)
MCV: 85.8 fl (ref 78.0–100.0)
Platelets: 208 10*3/uL (ref 150.0–400.0)
RBC: 4.56 Mil/uL (ref 3.87–5.11)
RDW: 12.9 % (ref 11.5–15.5)
WBC: 4.8 10*3/uL (ref 4.0–10.5)

## 2017-08-04 LAB — COMPREHENSIVE METABOLIC PANEL
ALT: 7 U/L (ref 0–35)
AST: 16 U/L (ref 0–37)
Albumin: 4.3 g/dL (ref 3.5–5.2)
Alkaline Phosphatase: 50 U/L (ref 39–117)
BUN: 7 mg/dL (ref 6–23)
CHLORIDE: 103 meq/L (ref 96–112)
CO2: 29 mEq/L (ref 19–32)
Calcium: 9.9 mg/dL (ref 8.4–10.5)
Creatinine, Ser: 0.72 mg/dL (ref 0.40–1.20)
GFR: 91.25 mL/min (ref >60.00)
GLUCOSE: 87 mg/dL (ref 70–99)
POTASSIUM: 5 meq/L (ref 3.5–5.1)
Sodium: 138 mEq/L (ref 135–145)
Total Bilirubin: 0.6 mg/dL (ref 0.2–1.2)
Total Protein: 6.9 g/dL (ref 6.0–8.3)

## 2017-08-04 LAB — TSH: TSH: 4.9 u[IU]/mL — ABNORMAL HIGH (ref 0.35–4.50)

## 2017-08-04 LAB — HEMOGLOBIN A1C: Hgb A1c MFr Bld: 5.7 % (ref 4.6–6.5)

## 2017-08-04 LAB — LIPID PANEL
Cholesterol: 174 mg/dL (ref 0–200)
HDL: 47 mg/dL (ref >39.00)
LDL Cholesterol: 96 mg/dL (ref 0–99)
NONHDL: 126.98
TRIGLYCERIDES: 155 mg/dL — AB (ref 0.0–149.0)
Total CHOL/HDL Ratio: 4
VLDL: 31 mg/dL (ref 0.0–40.0)

## 2017-08-04 MED ORDER — METOPROLOL SUCCINATE ER 25 MG PO TB24: 12.5000 mg | ORAL_TABLET | Freq: Every day | ORAL | 1 refills | Status: DC

## 2017-08-04 MED ORDER — LEVOTHYROXINE SODIUM 25 MCG PO TABS: 25.0000 ug | ORAL_TABLET | Freq: Every day | ORAL | 6 refills | Status: DC

## 2017-08-04 MED ORDER — SIMVASTATIN 20 MG PO TABS: 20.0000 mg | ORAL_TABLET | Freq: Every day | ORAL | 3 refills | Status: DC

## 2017-08-04 MED ORDER — SERTRALINE HCL 50 MG PO TABS: 50.0000 mg | ORAL_TABLET | Freq: Every day | ORAL | 3 refills | Status: DC

## 2017-08-04 NOTE — Addendum Note (Signed)
Addended by: Lamar Blinks C on: 08/04/2017 07:47 PM   Modules accepted: Orders

## 2017-08-04 NOTE — Patient Instructions (Signed)

## 2017-09-10 ENCOUNTER — Other Ambulatory Visit (INDEPENDENT_AMBULATORY_CARE_PROVIDER_SITE_OTHER): Payer: Managed Care, Other (non HMO)

## 2017-09-10 DIAGNOSIS — R7989 Other specified abnormal findings of blood chemistry: Secondary | ICD-10-CM | POA: Diagnosis not present

## 2017-09-10 LAB — TSH: TSH: 3.52 u[IU]/mL (ref 0.35–4.50)

## 2017-09-11 ENCOUNTER — Encounter: Payer: Self-pay | Admitting: Family Medicine

## 2017-12-30 ENCOUNTER — Other Ambulatory Visit: Payer: Self-pay | Admitting: Family Medicine

## 2018-04-04 ENCOUNTER — Encounter: Payer: Self-pay | Admitting: Family Medicine

## 2018-04-14 DIAGNOSIS — E039 Hypothyroidism, unspecified: Secondary | ICD-10-CM | POA: Insufficient documentation

## 2018-04-14 NOTE — Progress Notes (Addendum)
[place] at [facility] [address], [place], [place] [address] [phone]  Date:  04/18/2018   Name:  Dana Kramer   DOB:  11-21-1965   MRN:  [id number]  PCP:  Darreld Mclean, MD    Chief Complaint: Hypothyroidism (tsh follow up)   History of Present Illness:  Dana Kramer is a 53 y.o. very pleasant female patient who presents with the following:  Here today for follow-up visit.  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  This was the second time her TSH is been elevated, so we started her on a low dose of thyroid medication 25 mcg Repeat TSH in July was back in normal range  She feels less tired since she got on the thyroid med BP Readings from Last 3 Encounters:  04/18/18 (!) 148/98  08/04/17 118/80  04/13/16 (!) 135/92   She notes that her home BP is generally DBP in the 90s She has been taking her toprol sometimes 25 mg and tolerates this well   She does not have a family history of colon cancer or any blood in her stool- would like to do cologuard  She will do her mammo this year  Patient Active Problem List   Diagnosis Date Noted  . Acquired hypothyroidism 04/14/2018  . Migraine 01/18/2015  . Insomnia 11/05/2011  . Anxiety 11/05/2011  . Hyperlipidemia 11/05/2011  . Tachycardia 11/05/2011  . Umbilical hernia 22/29/7989    Past Medical History:  Diagnosis Date  . Chicken pox   . History of transfusion of whole blood   . Hyperlipidemia   . Migraine     Past Surgical History:  Procedure Laterality Date  . ABDOMINOPLASTY  10/01/2010  . CESAREAN SECTION     twice  . umblica  hernia repair  10/01/2010    Social History   Tobacco Use  . Smoking status: Never Smoker  . Smokeless tobacco: Never Used  Substance Use Topics  . Alcohol use: No  . Drug use: No    Family History  Problem Relation Age of Onset  . Hypertension Mother   . Cancer Father     Allergies  Allergen Reactions  . Penicillins Rash    Mainly upper body only.    Medication list has been reviewed and updated.  Current Outpatient Medications on File Prior to Visit  Medication Sig Dispense Refill  . Calcium 200 MG TABS Take 1 tablet by mouth daily.    . Cholecalciferol (VITAMIN D PO) Take 1 tablet by mouth daily.    Marland Kitchen levothyroxine (SYNTHROID, LEVOTHROID) 25 MCG tablet Take 1 tablet (25 mcg total) by mouth daily before breakfast. 30 tablet 6  . metoprolol succinate (TOPROL-XL) 25 MG 24 hr tablet Take 0.5 tablets (12.5 mg total) by mouth daily. 90 tablet 1  . Multiple Vitamins-Minerals (WOMENS MULTIVITAMIN PO) Take 1 tablet by mouth daily.    . pantoprazole (PROTONIX) 20 MG tablet Take 20 mg by mouth daily.    . sertraline (ZOLOFT) 50 MG tablet Take 1 tablet (50 mg total) by mouth daily. 90 tablet 3  .  simvastatin (ZOCOR) 20 MG tablet Take 1 tablet (20 mg total) by mouth at bedtime. 90 tablet 3   No current facility-administered medications on file prior to visit.     Review of Systems:  As per HPI- otherwise negative.  Feeling well, no chest pain or shortness of breath Physical Examination: Vitals:   04/18/18 0810  BP: (!) 148/98  Pulse: 81  Resp: 16  Temp: 98.1 F (36.7 C)  SpO2: 98%   Vitals:   04/18/18 0810  Weight: 124 lb (56.2 kg)  Height: 5' (1.524 m)   Body mass index is 24.22 kg/m. Ideal Body Weight: Weight in (lb) to have BMI = 25: 127.7  GEN: WDWN, NAD, Non-toxic, A & O x 3 HEENT: Atraumatic, Normocephalic. Neck supple. No masses, No LAD. Ears and Nose: No external deformity. CV: RRR, No M/G/R. No JVD. No thrill. No extra heart sounds. PULM: CTA B, no  wheezes, crackles, rhonchi. No retractions. No resp. distress. No accessory muscle use. EXTR: No c/c/e NEURO Normal gait.  PSYCH: Normally interactive. Conversant. Not depressed or anxious appearing.  Calm demeanor.    Assessment and Plan: Acquired hypothyroidism - Plan: TSH  Colon cancer screening  Borderline high blood pressure  Rechecking on her thyroid today.  TSH is pending Ordered a Cologuard test for her today, she is now 66 and is an average risk person. She is on metoprolol XL 12.5 mg daily for tachycardia, but has been taking 25 mg Sundays.  She notes that her diastolic blood pressure sometimes in the 90s.  She plans to monitor this, and will increase her metoprolol to 25 mg.  If this does not bring her blood pressure in range, or if her pulse is dropping, we will plan to send in a different blood pressure medication for her.  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  She will keep me posted  Signed Lamar Blinks, MD  Received her thyroid test Results for orders placed or performed in visit on 04/18/18  TSH  Result Value Ref Range   TSH 3.55 0.35 - 4.50 uIU/mL   Message to patient

## 2018-04-18 ENCOUNTER — Encounter: Payer: Self-pay | Admitting: Family Medicine

## 2018-04-18 ENCOUNTER — Ambulatory Visit: Payer: Managed Care, Other (non HMO) | Admitting: Family Medicine

## 2018-04-18 VITALS — BP 138/92 | HR 81 | Temp 98.1°F | Resp 16 | Ht 60.0 in | Wt 124.0 lb

## 2018-04-18 DIAGNOSIS — R030 Elevated blood-pressure reading, without diagnosis of hypertension: Secondary | ICD-10-CM | POA: Diagnosis not present

## 2018-04-18 DIAGNOSIS — Z1211 Encounter for screening for malignant neoplasm of colon: Secondary | ICD-10-CM | POA: Diagnosis not present

## 2018-04-18 DIAGNOSIS — E039 Hypothyroidism, unspecified: Secondary | ICD-10-CM | POA: Diagnosis not present

## 2018-04-18 LAB — TSH: TSH: 3.55 u[IU]/mL (ref 0.35–4.50)

## 2018-04-18 NOTE — Patient Instructions (Signed)
I will be in touch with your TSH asap  Please continue to monitor your BP at home, you can increase metoprolol to 25 mg If your BP is still higher than 135/85, OR if pulse under 65 or so please let me know and I will set you up with a different BP med  I ordered cologuard for you as well

## 2018-05-12 ENCOUNTER — Telehealth: Payer: Self-pay | Admitting: *Deleted

## 2018-05-12 ENCOUNTER — Encounter: Payer: Self-pay | Admitting: Family Medicine

## 2018-05-12 LAB — COLOGUARD: Cologuard: NEGATIVE

## 2018-05-12 NOTE — Telephone Encounter (Signed)
Received Cologuard Results; forwarded to provider/SLS 03/05

## 2018-05-20 ENCOUNTER — Encounter: Payer: Self-pay | Admitting: Family Medicine

## 2018-06-07 ENCOUNTER — Encounter: Payer: Self-pay | Admitting: Family Medicine

## 2018-06-07 DIAGNOSIS — R000 Tachycardia, unspecified: Secondary | ICD-10-CM

## 2018-06-07 DIAGNOSIS — E039 Hypothyroidism, unspecified: Secondary | ICD-10-CM

## 2018-06-07 MED ORDER — LEVOTHYROXINE SODIUM 25 MCG PO TABS: 25.0000 ug | ORAL_TABLET | Freq: Every day | ORAL | 3 refills | Status: DC

## 2018-06-07 MED ORDER — METOPROLOL SUCCINATE ER 25 MG PO TB24: 25.0000 mg | ORAL_TABLET | Freq: Every day | ORAL | 3 refills | Status: DC

## 2018-06-07 NOTE — Telephone Encounter (Signed)
Patient just had appt on 04/18/2018

## 2018-08-08 ENCOUNTER — Encounter: Payer: Self-pay | Admitting: Family Medicine

## 2018-08-08 DIAGNOSIS — E785 Hyperlipidemia, unspecified: Secondary | ICD-10-CM

## 2018-08-08 DIAGNOSIS — F411 Generalized anxiety disorder: Secondary | ICD-10-CM

## 2018-08-08 MED ORDER — SERTRALINE HCL 50 MG PO TABS: 50.0000 mg | ORAL_TABLET | Freq: Every day | ORAL | 3 refills | Status: DC

## 2018-08-08 MED ORDER — SIMVASTATIN 20 MG PO TABS: 20.0000 mg | ORAL_TABLET | Freq: Every day | ORAL | 3 refills | Status: DC

## 2018-08-08 MED ORDER — SIMVASTATIN 20 MG PO TABS: 20.0000 mg | ORAL_TABLET | Freq: Every day | ORAL | 1 refills | Status: DC

## 2018-08-08 MED ORDER — SERTRALINE HCL 50 MG PO TABS: 50.0000 mg | ORAL_TABLET | Freq: Every day | ORAL | 1 refills | Status: DC

## 2018-08-08 NOTE — Telephone Encounter (Signed)
Dr Lorelei Pont -- refills have been sent for above medications. Pt was last seen 04/18/18 and has no future follow ups scheduled. When is she due for follow up?

## 2018-10-23 NOTE — Patient Instructions (Addendum)
It was very nice to see you again today I will be in touch with your labs as soon as possible Please have your flu shot this fall as usual You got your first shingles vaccine today- 2nd dose due in 2-6 months  Please schedule your mammogram Your BP was a little high today- please keep an eye on this at home/ work for me and let me know if you continue to run higher than 135/85- in that case we might need to adjust your medication  Take care and be safe!    Health Maintenance, Female Adopting a healthy lifestyle and getting preventive care are important in promoting health and wellness. Ask your health care provider about:  The right schedule for you to have regular tests and exams.  Things you can do on your own to prevent diseases and keep yourself healthy. What should I know about diet, weight, and exercise? Eat a healthy diet   Eat a diet that includes plenty of vegetables, fruits, low-fat dairy products, and lean protein.  Do not eat a lot of foods that are high in solid fats, added sugars, or sodium. Maintain a healthy weight Body mass index (BMI) is used to identify weight problems. It estimates body fat based on height and weight. Your health care provider can help determine your BMI and help you achieve or maintain a healthy weight. Get regular exercise Get regular exercise. This is one of the most important things you can do for your health. Most adults should:  Exercise for at least 150 minutes each week. The exercise should increase your heart rate and make you sweat (moderate-intensity exercise).  Do strengthening exercises at least twice a week. This is in addition to the moderate-intensity exercise.  Spend less time sitting. Even light physical activity can be beneficial. Watch cholesterol and blood lipids Have your blood tested for lipids and cholesterol at 51 years of age, then have this test every 5 years. Have your cholesterol levels checked more often  if:  Your lipid or cholesterol levels are high.  You are older than 51 years of age.  You are at high risk for heart disease. What should I know about cancer screening? Depending on your health history and family history, you may need to have cancer screening at various ages. This may include screening for:  Breast cancer.  Cervical cancer.  Colorectal cancer.  Skin cancer.  Lung cancer. What should I know about heart disease, diabetes, and high blood pressure? Blood pressure and heart disease  High blood pressure causes heart disease and increases the risk of stroke. This is more likely to develop in people who have high blood pressure readings, are of African descent, or are overweight.  Have your blood pressure checked: ? Every 3-5 years if you are 32-66 years of age. ? Every year if you are 66 years old or older. Diabetes Have regular diabetes screenings. This checks your fasting blood sugar level. Have the screening done:  Once every three years after age 67 if you are at a normal weight and have a low risk for diabetes.  More often and at a younger age if you are overweight or have a high risk for diabetes. What should I know about preventing infection? Hepatitis B If you have a higher risk for hepatitis B, you should be screened for this virus. Talk with your health care provider to find out if you are at risk for hepatitis B infection. Hepatitis C Testing is recommended  for:  Everyone born from [birth year] through 1963.  Anyone with known risk factors for hepatitis C. Sexually transmitted infections (STIs)  Get screened for STIs, including gonorrhea and chlamydia, if: ? You are sexually active and are younger than 51 years of age. ? You are older than 51 years of age and your health care provider tells you that you are at risk for this type of infection. ? Your sexual activity has changed since you were last screened, and you are at increased risk for chlamydia or  gonorrhea. Ask your health care provider if you are at risk.  Ask your health care provider about whether you are at high risk for HIV. Your health care provider may recommend a prescription medicine to help prevent HIV infection. If you choose to take medicine to prevent HIV, you should first get tested for HIV. You should then be tested every 3 months for as long as you are taking the medicine. Pregnancy  If you are about to stop having your period (premenopausal) and you may become pregnant, seek counseling before you get pregnant.  Take 400 to 800 micrograms (mcg) of folic acid every day if you become pregnant.  Ask for birth control (contraception) if you want to prevent pregnancy. Osteoporosis and menopause Osteoporosis is a disease in which the bones lose minerals and strength with aging. This can result in bone fractures. If you are 12 years old or older, or if you are at risk for osteoporosis and fractures, ask your health care provider if you should:  Be screened for bone loss.  Take a calcium or vitamin D supplement to lower your risk of fractures.  Be given hormone replacement therapy (HRT) to treat symptoms of menopause. Follow these instructions at home: Lifestyle  Do not use any products that contain nicotine or tobacco, such as cigarettes, e-cigarettes, and chewing tobacco. If you need help quitting, ask your health care provider.  Do not use street drugs.  Do not share needles.  Ask your health care provider for help if you need support or information about quitting drugs. Alcohol use  Do not drink alcohol if: ? Your health care provider tells you not to drink. ? You are pregnant, may be pregnant, or are planning to become pregnant.  If you drink alcohol: ? Limit how much you use to 0-1 drink a day. ? Limit intake if you are breastfeeding.  Be aware of how much alcohol is in your drink. In the [place] one drink equals one 12 oz bottle of beer (355 mL), one 5 oz  glass of wine (148 mL), or one 1 oz glass of hard liquor (44 mL). General instructions  Schedule regular health, dental, and eye exams.  Stay current with your vaccines.  Tell your health care provider if: ? You often feel depressed. ? You have ever been abused or do not feel safe at home. Summary  Adopting a healthy lifestyle and getting preventive care are important in promoting health and wellness.  Follow your health care provider's instructions about healthy diet, exercising, and getting tested or screened for diseases.  Follow your health care provider's instructions on monitoring your cholesterol and blood pressure. This information is not intended to replace advice given to you by your health care provider. Make sure you discuss any questions you have with your health care provider. Document Released: 09/08/2010 Document Revised: 02/16/2018 Document Reviewed: 02/16/2018 Elsevier Patient Education  2020 [employer].

## 2018-10-23 NOTE — Progress Notes (Addendum)
[place] at [facility] [place], [place], [place] [address] [phone]  Date:  10/26/2018   Name:  Dana Kramer   DOB:  [birth year]   MRN:  [id number]  PCP:  Darreld Mclean, MD    Chief Complaint: Annual Exam   History of Present Illness:  Dana Kramer is a 53 y.o. 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 Acquired hypothyroidism 04/14/2018  . Migraine 01/18/2015  . Insomnia 11/05/2011  . Anxiety 11/05/2011  . Hyperlipidemia 11/05/2011  . Tachycardia 11/05/2011  . Umbilical hernia 36/64/4034    Past Medical History:  Diagnosis Date  . Chicken pox   . History of transfusion of whole blood   . Hyperlipidemia   . Migraine     Past Surgical History:  Procedure Laterality Date  . ABDOMINOPLASTY  10/01/2010  . CESAREAN SECTION     twice  . umblica hernia repair  7/42/5956    Social History   Tobacco Use  . Smoking status: Never Smoker  . Smokeless tobacco: Never Used  Substance Use Topics  . Alcohol use: No  . Drug use: No    Family History  Problem Relation Age of Onset  . Hypertension Mother   . Cancer Father     Allergies  Allergen Reactions  . Penicillins Rash    Mainly upper body only.    Medication list has been reviewed and updated.  Current Outpatient  Medications on File Prior to Visit  Medication Sig Dispense Refill  . Calcium 200 MG TABS Take 1 tablet by mouth daily.    . Cholecalciferol (VITAMIN D PO) Take 1 tablet by mouth daily.    Marland Kitchen levothyroxine (SYNTHROID, LEVOTHROID) 25 MCG tablet Take 1 tablet (25 mcg total) by mouth daily before breakfast. 90 tablet 3  . metoprolol succinate (TOPROL-XL) 25 MG 24 hr tablet Take 1 tablet (25 mg total) by mouth daily. 90 tablet 3  . Multiple Vitamins-Minerals (WOMENS MULTIVITAMIN PO) Take 1 tablet by mouth daily.    . pantoprazole (PROTONIX) 20 MG tablet Take 20 mg by mouth daily.    . sertraline (ZOLOFT) 50 MG tablet Take 1 tablet (50 mg total) by mouth daily. 90 tablet 3  . simvastatin (ZOCOR) 20 MG tablet Take 1 tablet (20 mg total) by mouth at bedtime. 90 tablet 3   No current facility-administered medications on file prior to visit.     Review of Systems:  As per HPI- otherwise negative. No CP or SOB with exertion   Physical Examination: Vitals:   10/26/18 0805  BP: (!) 144/98  Pulse: 92  Resp: 16  Temp: (!) 97.2 F (36.2 C)  SpO2: 98%   Vitals:   10/26/18 0805  Weight: 123 lb (55.8 kg)  Height: 5' (1.524 m)  Body mass index is 24.02 kg/m. Ideal Body Weight: Weight in (lb) to have BMI = 25: 127.7  GEN: WDWN, NAD, Non-toxic, A & O x 3, normal weight, fit build, looks well  HEENT: Atraumatic, Normocephalic. Neck supple. No masses, No LAD.  TM wnl  Ears and Nose: No external deformity. CV: RRR, No M/G/R. No JVD. No thrill. No extra heart sounds. PULM: CTA B, no wheezes, crackles, rhonchi. No retractions. No resp. distress. No accessory muscle use. ABD: S, NT, ND. No rebound. No HSM. EXTR: No c/c/e NEURO Normal gait.  PSYCH: Normally interactive. Conversant. Not depressed or anxious appearing.  Calm demeanor.   BP Readings from Last 3 Encounters:  10/26/18 (!) 144/98  04/18/18 (!) 138/92  08/04/17 118/80    Assessment and Plan:   ICD-10-CM   1. Physical exam   Z00.00   2. GAD (generalized anxiety disorder)  F41.1   3. Dyslipidemia  E78.5 Lipid panel  4. Acquired hypothyroidism  E03.9 TSH  5. Mixed hyperlipidemia  E78.2   6. Screening for diabetes mellitus  Z13.1 Comprehensive metabolic panel    Hemoglobin A1c  7. Screening for HIV (human immunodeficiency virus)  Z11.4 HIV Antibody (routine testing w rflx)  8. Medication monitoring encounter  Z51.81 CBC  9. Immunization due  Z23 Varicella-zoster vaccine IM (Shingrix)   CPE today Labs pending as above shingrix #1 Flu shot later this fall BP is elevated but she was up all night working She will monitor and let me know if continues to run high   Follow-up: No follow-ups on file.  No orders of the defined types were placed in this encounter.  Orders Placed This Encounter  Procedures  . Varicella-zoster vaccine IM (Shingrix)  . CBC  . Comprehensive metabolic panel  . Hemoglobin A1c  . Lipid panel  . TSH  . HIV Antibody (routine testing w rflx)    @SIGN @   Signed Lamar Blinks, MD  Received her labs, message to pt  Results for orders placed or performed in visit on 10/26/18  CBC  Result Value Ref Range   WBC 6.1 4.0 - 10.5 K/uL   RBC 4.54 3.87 - 5.11 Mil/uL   Platelets 227.0 150.0 - 400.0 K/uL   Hemoglobin 13.2 12.0 - 15.0 g/dL   HCT 38.7 36.0 - 46.0 %   MCV 85.3 78.0 - 100.0 fl   MCHC 34.1 30.0 - 36.0 g/dL   RDW 12.8 11.5 - 15.5 %  Comprehensive metabolic panel  Result Value Ref Range   Sodium 139 135 - 145 mEq/L   Potassium 4.4 3.5 - 5.1 mEq/L   Chloride 101 96 - 112 mEq/L   CO2 28 19 - 32 mEq/L   Glucose, Bld 78 70 - 99 mg/dL   BUN 9 6 - 23 mg/dL   Creatinine, Ser 0.74 0.40 - 1.20 mg/dL   Total Bilirubin 0.7 0.2 - 1.2 mg/dL   Alkaline Phosphatase 53 39 - 117 U/L   AST 20 0 - 37 U/L   ALT 9 0 - 35 U/L   Total Protein 7.3 6.0 - 8.3 g/dL   Albumin 4.8 3.5 - 5.2 g/dL   Calcium 10.0 8.4 - 10.5 mg/dL   GFR 82.77 >60.00 mL/min  Hemoglobin A1c  Result Value Ref  Range   Hgb A1c MFr Bld 5.7 4.6 - 6.5 %  Lipid panel  Result Value Ref Range   Cholesterol 190 0 - 200 mg/dL   Triglycerides 154.0 (H) 0.0 - 149.0 mg/dL  HDL 51.40 >39.00 mg/dL   VLDL 30.8 0.0 - 40.0 mg/dL   LDL Cholesterol 108 (H) 0 - 99 mg/dL   Total CHOL/HDL Ratio 4    NonHDL 138.76   TSH  Result Value Ref Range   TSH 2.65 0.35 - 4.50 uIU/mL

## 2018-10-26 ENCOUNTER — Encounter: Payer: Self-pay | Admitting: Family Medicine

## 2018-10-26 ENCOUNTER — Other Ambulatory Visit: Payer: Self-pay

## 2018-10-26 ENCOUNTER — Ambulatory Visit (INDEPENDENT_AMBULATORY_CARE_PROVIDER_SITE_OTHER): Payer: Managed Care, Other (non HMO) | Admitting: Family Medicine

## 2018-10-26 VITALS — BP 140/90 | HR 92 | Temp 97.2°F | Resp 16 | Ht 60.0 in | Wt 123.0 lb

## 2018-10-26 DIAGNOSIS — E039 Hypothyroidism, unspecified: Secondary | ICD-10-CM

## 2018-10-26 DIAGNOSIS — F411 Generalized anxiety disorder: Secondary | ICD-10-CM

## 2018-10-26 DIAGNOSIS — Z114 Encounter for screening for human immunodeficiency virus [HIV]: Secondary | ICD-10-CM

## 2018-10-26 DIAGNOSIS — E785 Hyperlipidemia, unspecified: Secondary | ICD-10-CM | POA: Diagnosis not present

## 2018-10-26 DIAGNOSIS — Z131 Encounter for screening for diabetes mellitus: Secondary | ICD-10-CM | POA: Diagnosis not present

## 2018-10-26 DIAGNOSIS — Z5181 Encounter for therapeutic drug level monitoring: Secondary | ICD-10-CM

## 2018-10-26 DIAGNOSIS — E782 Mixed hyperlipidemia: Secondary | ICD-10-CM

## 2018-10-26 DIAGNOSIS — Z23 Encounter for immunization: Secondary | ICD-10-CM

## 2018-10-26 DIAGNOSIS — Z0000 Encounter for general adult medical examination without abnormal findings: Secondary | ICD-10-CM

## 2018-10-26 LAB — COMPREHENSIVE METABOLIC PANEL
ALT: 9 U/L (ref 0–35)
AST: 20 U/L (ref 0–37)
Albumin: 4.8 g/dL (ref 3.5–5.2)
Alkaline Phosphatase: 53 U/L (ref 39–117)
BUN: 9 mg/dL (ref 6–23)
CO2: 28 mEq/L (ref 19–32)
Calcium: 10 mg/dL (ref 8.4–10.5)
Chloride: 101 mEq/L (ref 96–112)
Creatinine, Ser: 0.74 mg/dL (ref 0.40–1.20)
GFR: 82.77 mL/min (ref >60.00)
Glucose, Bld: 78 mg/dL (ref 70–99)
Potassium: 4.4 mEq/L (ref 3.5–5.1)
Sodium: 139 mEq/L (ref 135–145)
Total Bilirubin: 0.7 mg/dL (ref 0.2–1.2)
Total Protein: 7.3 g/dL (ref 6.0–8.3)

## 2018-10-26 LAB — LIPID PANEL
Cholesterol: 190 mg/dL (ref 0–200)
HDL: 51.4 mg/dL (ref >39.00)
LDL Cholesterol: 108 mg/dL — ABNORMAL HIGH (ref 0–99)
NonHDL: 138.76
Total CHOL/HDL Ratio: 4
Triglycerides: 154 mg/dL — ABNORMAL HIGH (ref 0.0–149.0)
VLDL: 30.8 mg/dL (ref 0.0–40.0)

## 2018-10-26 LAB — CBC
HCT: 38.7 % (ref 36.0–46.0)
Hemoglobin: 13.2 g/dL (ref 12.0–15.0)
MCHC: 34.1 g/dL (ref 30.0–36.0)
MCV: 85.3 fl (ref 78.0–100.0)
Platelets: 227 10*3/uL (ref 150.0–400.0)
RBC: 4.54 Mil/uL (ref 3.87–5.11)
RDW: 12.8 % (ref 11.5–15.5)
WBC: 6.1 10*3/uL (ref 4.0–10.5)

## 2018-10-26 LAB — HEMOGLOBIN A1C: Hgb A1c MFr Bld: 5.7 % (ref 4.6–6.5)

## 2018-10-26 LAB — TSH: TSH: 2.65 u[IU]/mL (ref 0.35–4.50)

## 2018-10-27 LAB — HIV ANTIBODY (ROUTINE TESTING W REFLEX): HIV 1&2 Ab, 4th Generation: NONREACTIVE

## 2019-04-10 NOTE — Progress Notes (Addendum)
[place] at [facility] [place], [place], [place] [address] [phone]  Date:  04/12/2019   Name:  Dana Kramer   DOB:  1966/02/07   MRN:  [id number]  PCP:  Darreld Mclean, MD    Chief Complaint: Hypothyroidism and Shingrix vaccine (second vaccine covid-19 given on 04/05/2019)   History of Present Illness:  Dana Kramer is a 54 y.o. 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  Reviewed her latest recommendations, will have her wait at least 2 weeks prior to her Shingrix Mammogram also appears to be due-ordered for her today Pap smear- she prefers to do at her CPE this summer   Levothyroxine Toprol XL Sertraline Simvastatin ?  Protonix; still taking sometimes  Her last A1c was slightly elevated at 5.7, this has been stable for 3 years  Lab Results  Component Value Date   TSH 2.65 10/26/2018    Patient Active Problem List   Diagnosis Date Noted  . Acquired hypothyroidism 04/14/2018  . Migraine 01/18/2015  . Insomnia 11/05/2011  . Anxiety 11/05/2011  . Hyperlipidemia 11/05/2011  . Tachycardia 11/05/2011  . Umbilical hernia 0000000    Past Medical History:  Diagnosis Date  . Chicken pox   . History of transfusion of whole blood   . Hyperlipidemia   . Migraine     Past Surgical History:  Procedure Laterality Date  . ABDOMINOPLASTY  10/01/2010  . CESAREAN SECTION     twice  . umblica hernia repair  [id number]    Social History   Tobacco Use  . Smoking status: Never  Smoker  . Smokeless tobacco: Never Used  Substance Use Topics  . Alcohol use: No  . Drug use: No    Family History  Problem Relation Age of Onset  . Hypertension Mother   . Cancer Father     Allergies  Allergen Reactions  . Penicillins Rash    Mainly upper body only.    Medication list has been reviewed and updated.  Current Outpatient Medications on File Prior to Visit  Medication Sig Dispense Refill  . Calcium 200 MG TABS Take 1 tablet by mouth daily.    . Cholecalciferol (VITAMIN D PO) Take 1 tablet by mouth daily.    Marland Kitchen levothyroxine (SYNTHROID, LEVOTHROID) 25 MCG tablet Take 1 tablet (25 mcg total) by mouth daily before breakfast. 90 tablet 3  . metoprolol succinate (TOPROL-XL) 25 MG 24 hr tablet Take 1 tablet (25 mg total) by mouth daily. 90 tablet 3  . Multiple Vitamins-Minerals (WOMENS MULTIVITAMIN PO) Take 1 tablet by mouth daily.    . pantoprazole (PROTONIX) 20 MG tablet Take 20 mg by mouth daily.    . sertraline (ZOLOFT) 50 MG tablet Take 1 tablet (50 mg total) by mouth daily. 90 tablet 3  . simvastatin (ZOCOR) 20 MG tablet Take 1 tablet (20 mg total) by mouth at bedtime. 90 tablet 3   No current facility-administered medications on file prior to visit.    Review of Systems:  As per HPI-  otherwise negative.  No fever or chills, no chest pain or shortness of breath Physical Examination: Vitals:   04/12/19 0837  BP: 128/82  Pulse: 80  Resp: 16  Temp: (!) 96.6 F (35.9 C)  SpO2: 98%   Vitals:   04/12/19 0837  Weight: 123 lb (55.8 kg)  Height: 5' (1.524 m)   Body mass index is 24.02 kg/m. Ideal Body Weight: Weight in (lb) to have BMI = 25: 127.7  GEN: WDWN, NAD, Non-toxic, A & O x 3, petite build, looks healthy and well HEENT: Atraumatic, Normocephalic. Neck supple. No masses, No LAD.  TM wnl bilaterally  Ears and Nose: No external deformity. CV: RRR, No M/G/R. No JVD. No thrill. No extra heart sounds. PULM: CTA B, no wheezes, crackles, rhonchi. No  retractions. No resp. distress. No accessory muscle use. EXTR: No c/c/e NEURO Normal gait.  PSYCH: Normally interactive. Conversant. Not depressed or anxious appearing.  Calm demeanor.    Assessment and Plan: Acquired hypothyroidism - Plan: TSH  Immunization due  Screening for diabetes mellitus - Plan: Hemoglobin A1c  Encounter for screening mammogram for breast cancer - Plan: MM 3D SCREEN BREAST BILATERAL  Here today for follow-up visit.  TSH, A1c pending.  History of barely prediabetic A1c in the past Discussed her immunization plans, scheduled her for a nurse visit for Shingrix later this month Ordered mammogram, she plans to do ASAP She prefers to do Pap at her physical this summer Plan to follow-up for complete physical in August Moderate medical decision making today .further  This visit occurred during the SARS-CoV-2 public health emergency.  Safety protocols were in place, including screening questions prior to the visit, additional usage of staff PPE, and extensive cleaning of exam room while observing appropriate contact time as indicated for disinfecting solutions.    Signed Lamar Blinks, MD  Received her labs as below, message to patient  Results for orders placed or performed in visit on 04/12/19  TSH  Result Value Ref Range   TSH 2.71 0.35 - 4.50 uIU/mL  Hemoglobin A1c  Result Value Ref Range   Hgb A1c MFr Bld 6.0 4.6 - 6.5 %

## 2019-04-10 NOTE — Patient Instructions (Addendum)
Great to see you again today, I will be in touch with your thyroid level Mammogram ordered- please do this at your earliest convenience  Please see me for physical in August- we can do your pap at that time Let me know if any concerns in the meantime and be safe!

## 2019-04-11 ENCOUNTER — Other Ambulatory Visit: Payer: Self-pay

## 2019-04-12 ENCOUNTER — Ambulatory Visit (HOSPITAL_BASED_OUTPATIENT_CLINIC_OR_DEPARTMENT_OTHER)
Admission: RE | Admit: 2019-04-12 | Discharge: 2019-04-12 | Disposition: A | Discharge status: Home / Self Care | Payer: Managed Care, Other (non HMO) | Source: Ambulatory Visit | Attending: Family Medicine | Admitting: Family Medicine

## 2019-04-12 ENCOUNTER — Ambulatory Visit: Payer: Managed Care, Other (non HMO) | Admitting: Family Medicine

## 2019-04-12 ENCOUNTER — Encounter: Payer: Self-pay | Admitting: Family Medicine

## 2019-04-12 ENCOUNTER — Other Ambulatory Visit: Payer: Self-pay | Admitting: Family Medicine

## 2019-04-12 VITALS — BP 128/82 | HR 80 | Temp 96.6°F | Resp 16 | Ht 60.0 in | Wt 123.0 lb

## 2019-04-12 DIAGNOSIS — Z1231 Encounter for screening mammogram for malignant neoplasm of breast: Secondary | ICD-10-CM | POA: Diagnosis present

## 2019-04-12 DIAGNOSIS — Z131 Encounter for screening for diabetes mellitus: Secondary | ICD-10-CM

## 2019-04-12 DIAGNOSIS — Z23 Encounter for immunization: Secondary | ICD-10-CM

## 2019-04-12 DIAGNOSIS — E039 Hypothyroidism, unspecified: Secondary | ICD-10-CM | POA: Diagnosis not present

## 2019-04-12 DIAGNOSIS — R928 Other abnormal and inconclusive findings on diagnostic imaging of breast: Secondary | ICD-10-CM

## 2019-04-12 LAB — HEMOGLOBIN A1C: Hgb A1c MFr Bld: 6 % (ref 4.6–6.5)

## 2019-04-12 LAB — TSH: TSH: 2.71 u[IU]/mL (ref 0.35–4.50)

## 2019-04-19 ENCOUNTER — Ambulatory Visit
Admission: RE | Admit: 2019-04-19 | Discharge: 2019-04-19 | Disposition: A | Discharge status: Home / Self Care | Payer: Managed Care, Other (non HMO) | Source: Ambulatory Visit | Attending: Family Medicine | Admitting: Family Medicine

## 2019-04-19 ENCOUNTER — Other Ambulatory Visit: Payer: Self-pay

## 2019-04-19 ENCOUNTER — Ambulatory Visit (INDEPENDENT_AMBULATORY_CARE_PROVIDER_SITE_OTHER): Payer: Managed Care, Other (non HMO)

## 2019-04-19 DIAGNOSIS — R928 Other abnormal and inconclusive findings on diagnostic imaging of breast: Secondary | ICD-10-CM

## 2019-04-19 DIAGNOSIS — Z23 Encounter for immunization: Secondary | ICD-10-CM

## 2019-05-26 ENCOUNTER — Encounter: Payer: Self-pay | Admitting: Family Medicine

## 2019-05-26 DIAGNOSIS — E039 Hypothyroidism, unspecified: Secondary | ICD-10-CM

## 2019-05-26 MED ORDER — LEVOTHYROXINE SODIUM 25 MCG PO TABS: 25.0000 ug | ORAL_TABLET | Freq: Every day | ORAL | 3 refills | Status: DC

## 2019-06-29 ENCOUNTER — Encounter: Payer: Self-pay | Admitting: Family Medicine

## 2019-06-29 DIAGNOSIS — R000 Tachycardia, unspecified: Secondary | ICD-10-CM

## 2019-06-29 MED ORDER — METOPROLOL SUCCINATE ER 25 MG PO TB24: 25.0000 mg | ORAL_TABLET | Freq: Every day | ORAL | 3 refills | Status: DC

## 2019-09-20 ENCOUNTER — Other Ambulatory Visit: Payer: Self-pay

## 2019-09-20 DIAGNOSIS — E785 Hyperlipidemia, unspecified: Secondary | ICD-10-CM

## 2019-09-20 MED ORDER — SIMVASTATIN 20 MG PO TABS: 20.0000 mg | ORAL_TABLET | Freq: Every day | ORAL | 3 refills | Status: DC

## 2019-10-31 NOTE — Patient Instructions (Signed)
It was great to see you again today, I will be in touch with your labs as soon as possible Keep up the good work taking care of yourself Flu and covid boosters this fall     Health Maintenance, Female Adopting a healthy lifestyle and getting preventive care are important in promoting health and wellness. Ask your health care provider about:  The right schedule for you to have regular tests and exams.  Things you can do on your own to prevent diseases and keep yourself healthy. What should I know about diet, weight, and exercise? Eat a healthy diet   Eat a diet that includes plenty of vegetables, fruits, low-fat dairy products, and lean protein.  Do not eat a lot of foods that are high in solid fats, added sugars, or sodium. Maintain a healthy weight Body mass index (BMI) is used to identify weight problems. It estimates body fat based on height and weight. Your health care provider can help determine your BMI and help you achieve or maintain a healthy weight. Get regular exercise Get regular exercise. This is one of the most important things you can do for your health. Most adults should:  Exercise for at least 150 minutes each week. The exercise should increase your heart rate and make you sweat (moderate-intensity exercise).  Do strengthening exercises at least twice a week. This is in addition to the moderate-intensity exercise.  Spend less time sitting. Even light physical activity can be beneficial. Watch cholesterol and blood lipids Have your blood tested for lipids and cholesterol at 52 years of age, then have this test every 5 years. Have your cholesterol levels checked more often if:  Your lipid or cholesterol levels are high.  You are older than 52 years of age.  You are at high risk for heart disease. What should I know about cancer screening? Depending on your health history and family history, you may need to have cancer screening at various ages. This may include  screening for:  Breast cancer.  Cervical cancer.  Colorectal cancer.  Skin cancer.  Lung cancer. What should I know about heart disease, diabetes, and high blood pressure? Blood pressure and heart disease  High blood pressure causes heart disease and increases the risk of stroke. This is more likely to develop in people who have high blood pressure readings, are of African descent, or are overweight.  Have your blood pressure checked: ? Every 3-5 years if you are 62-43 years of age. ? Every year if you are 34 years old or older. Diabetes Have regular diabetes screenings. This checks your fasting blood sugar level. Have the screening done:  Once every three years after age 71 if you are at a normal weight and have a low risk for diabetes.  More often and at a younger age if you are overweight or have a high risk for diabetes. What should I know about preventing infection? Hepatitis B If you have a higher risk for hepatitis B, you should be screened for this virus. Talk with your health care provider to find out if you are at risk for hepatitis B infection. Hepatitis C Testing is recommended for:  Everyone born from [birth year] through 1963.  Anyone with known risk factors for hepatitis C. Sexually transmitted infections (STIs)  Get screened for STIs, including gonorrhea and chlamydia, if: ? You are sexually active and are younger than 52 years of age. ? You are older than 52 years of age and your health care provider  tells you that you are at risk for this type of infection. ? Your sexual activity has changed since you were last screened, and you are at increased risk for chlamydia or gonorrhea. Ask your health care provider if you are at risk.  Ask your health care provider about whether you are at high risk for HIV. Your health care provider may recommend a prescription medicine to help prevent HIV infection. If you choose to take medicine to prevent HIV, you should first get  tested for HIV. You should then be tested every 3 months for as long as you are taking the medicine. Pregnancy  If you are about to stop having your period (premenopausal) and you may become pregnant, seek counseling before you get pregnant.  Take 400 to 800 micrograms (mcg) of folic acid every day if you become pregnant.  Ask for birth control (contraception) if you want to prevent pregnancy. Osteoporosis and menopause Osteoporosis is a disease in which the bones lose minerals and strength with aging. This can result in bone fractures. If you are 27 years old or older, or if you are at risk for osteoporosis and fractures, ask your health care provider if you should:  Be screened for bone loss.  Take a calcium or vitamin D supplement to lower your risk of fractures.  Be given hormone replacement therapy (HRT) to treat symptoms of menopause. Follow these instructions at home: Lifestyle  Do not use any products that contain nicotine or tobacco, such as cigarettes, e-cigarettes, and chewing tobacco. If you need help quitting, ask your health care provider.  Do not use street drugs.  Do not share needles.  Ask your health care provider for help if you need support or information about quitting drugs. Alcohol use  Do not drink alcohol if: ? Your health care provider tells you not to drink. ? You are pregnant, may be pregnant, or are planning to become pregnant.  If you drink alcohol: ? Limit how much you use to 0-1 drink a day. ? Limit intake if you are breastfeeding.  Be aware of how much alcohol is in your drink. In the [place] one drink equals one 12 oz bottle of beer (355 mL), one 5 oz glass of wine (148 mL), or one 1 oz glass of hard liquor (44 mL). General instructions  Schedule regular health, dental, and eye exams.  Stay current with your vaccines.  Tell your health care provider if: ? You often feel depressed. ? You have ever been abused or do not feel safe at  home. Summary  Adopting a healthy lifestyle and getting preventive care are important in promoting health and wellness.  Follow your health care provider's instructions about healthy diet, exercising, and getting tested or screened for diseases.  Follow your health care provider's instructions on monitoring your cholesterol and blood pressure. This information is not intended to replace advice given to you by your health care provider. Make sure you discuss any questions you have with your health care provider. Document Revised: 02/16/2018 Document Reviewed: 02/16/2018 Elsevier Patient Education  2020 [employer].

## 2019-10-31 NOTE — Progress Notes (Addendum)
[facility] [place], [place], [place] [address] [phone]  Date:  11/02/2019   Name:  Dana Kramer   DOB:  [birth year]   MRN:  [id number]  PCP:  Darreld Mclean, MD    Chief Complaint: Annual Exam   History of Present Illness:  Dana Kramer is a 54 y.o. 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  She feels that she is handling the stress okay.  She does need a refill of her sertraline Enjoys exercise at [employer] gets regular exercise, no chest pain or shortness of breath Flu shot given at work  Pap smear- update today  Mammogram up-to-date Cologuard up-to-date Routine labs, hep C screening due today Patient Active Problem List   Diagnosis Date Noted  . Acquired hypothyroidism 04/14/2018  . Migraine 01/18/2015  . Insomnia 11/05/2011  . Anxiety 11/05/2011  . Hyperlipidemia 11/05/2011  . Tachycardia 11/05/2011  . Umbilical hernia 83/25/4982    Past Medical History:  Diagnosis Date  . Chicken pox   . History of transfusion of whole blood   . Hyperlipidemia   . Migraine     Past Surgical History:  Procedure Laterality Date  . ABDOMINOPLASTY  10/01/2010  . CESAREAN SECTION     twice  . umblica hernia repair  6/41/5830    Social History   Tobacco Use  . Smoking status: Never Smoker  . Smokeless tobacco: Never Used  Substance Use Topics  . Alcohol use: No  . Drug use: No    Family History  Problem Relation Age of Onset  . Hypertension Mother   . Cancer  Father     Allergies  Allergen Reactions  . Penicillins Rash    Mainly upper body only.    Medication list has been reviewed and updated.  Current Outpatient Medications on File Prior to Visit  Medication Sig Dispense Refill  . Calcium 200 MG TABS Take 1 tablet by mouth daily.    . Cholecalciferol (VITAMIN D PO) Take 1 tablet by mouth daily.    Marland Kitchen levothyroxine (SYNTHROID) 25 MCG tablet Take 1 tablet (25 mcg total) by mouth daily before breakfast. 90 tablet 3  . metoprolol succinate (TOPROL-XL) 25 MG 24 hr tablet Take 1 tablet (25 mg total) by mouth daily. 90 tablet 3  . Multiple Vitamins-Minerals (WOMENS MULTIVITAMIN PO) Take 1 tablet by mouth daily.    . pantoprazole (PROTONIX) 20 MG tablet Take 20 mg by mouth daily.    . simvastatin (ZOCOR) 20 MG tablet Take 1 tablet (20 mg total) by mouth at bedtime. 90 tablet 3   No current facility-administered medications on file prior to visit.    Review of Systems:  As per HPI- otherwise negative.   Physical Examination: Vitals:   11/02/19 1103  BP: 110/72  Pulse: 86  Resp: 16  SpO2: 98%   There were no vitals filed for this visit. There is no height or weight on file to calculate BMI. Ideal Body Weight:    GEN: no acute  distress.  Normal weight, fit build  Looks well HEENT: Atraumatic, Normocephalic.  Ears and Nose: No external deformity. CV: RRR, No M/G/R. No JVD. No thrill. No extra heart sounds. PULM: CTA B, no wheezes, crackles, rhonchi. No retractions. No resp. distress. No accessory muscle use. ABD: S, NT, ND, +BS. No rebound. No HSM. EXTR: No c/c/e PSYCH: Normally interactive. Conversant.    Assessment and Plan: Physical exam  Acquired hypothyroidism - Plan: TSH  Screening for diabetes mellitus - Plan: Comprehensive metabolic panel, Hemoglobin A1c  Dyslipidemia - Plan: Lipid panel  GAD (generalized anxiety disorder) - Plan: sertraline (ZOLOFT) 50 MG tablet  Encounter for hepatitis C screening test for low  risk patient - Plan: Hepatitis C antibody  Screening for deficiency anemia - Plan: CBC  Screening for cervical cancer - Plan: Cytology - PAP  Physical exam today BP under good control  Will plan further follow- up pending labs. Encouraged continued healthy diet and exercise routine  This visit occurred during the SARS-CoV-2 public health emergency.  Safety protocols were in place, including screening questions prior to the visit, additional usage of staff PPE, and extensive cleaning of exam room while observing appropriate contact time as indicated for disinfecting solutions.     Signed Lamar Blinks, MD  Received her labs as below, 8/27 Message to patient  Results for orders placed or performed in visit on 11/02/19  CBC  Result Value Ref Range   WBC 5.7 3.8 - 10.8 Thousand/uL   RBC 4.65 3.80 - 5.10 Million/uL   Hemoglobin 13.5 11.7 - 15.5 g/dL   HCT 41.1 35 - 45 %   MCV 88.4 80.0 - 100.0 fL   MCH 29.0 27.0 - 33.0 pg   MCHC 32.8 32.0 - 36.0 g/dL   RDW 12.2 11.0 - 15.0 %   Platelets 218 140 - 400 Thousand/uL   MPV 11.1 7.5 - 12.5 fL  Comprehensive metabolic panel  Result Value Ref Range   Glucose, Bld 91 65 - 99 mg/dL   BUN 13 7 - 25 mg/dL   Creat 0.83 0.50 - 1.05 mg/dL   BUN/Creatinine Ratio NOT APPLICABLE 6 - 22 (calc)   Sodium 139 135 - 146 mmol/L   Potassium 4.5 3.5 - 5.3 mmol/L   Chloride 102 98 - 110 mmol/L   CO2 25 20 - 32 mmol/L   Calcium 9.4 8.6 - 10.4 mg/dL   Total Protein 6.9 6.1 - 8.1 g/dL   Albumin 4.5 3.6 - 5.1 g/dL   Globulin 2.4 1.9 - 3.7 g/dL (calc)   AG Ratio 1.9 1.0 - 2.5 (calc)   Total Bilirubin 0.7 0.2 - 1.2 mg/dL   Alkaline phosphatase (APISO) 54 37 - 153 U/L   AST 19 10 - 35 U/L   ALT 8 6 - 29 U/L  Hemoglobin A1c  Result Value Ref Range   Hgb A1c MFr Bld 5.6 <5.7 % of total Hgb   Mean Plasma Glucose 114 (calc)   eAG (mmol/L) 6.3 (calc)  Lipid panel  Result Value Ref Range   Cholesterol 193 <200 mg/dL   HDL 54 > OR = 50 mg/dL    Triglycerides 157 (H) <150 mg/dL   LDL Cholesterol (Calc) 112 (H) mg/dL (calc)   Total CHOL/HDL Ratio 3.6 <5.0 (calc)   Non-HDL Cholesterol (Calc) 139 (H) <130 mg/dL (calc)  TSH  Result Value Ref Range   TSH 1.03 mIU/L

## 2019-11-02 ENCOUNTER — Other Ambulatory Visit: Payer: Self-pay

## 2019-11-02 ENCOUNTER — Ambulatory Visit (INDEPENDENT_AMBULATORY_CARE_PROVIDER_SITE_OTHER): Payer: Managed Care, Other (non HMO) | Admitting: Family Medicine

## 2019-11-02 ENCOUNTER — Encounter: Payer: Self-pay | Admitting: Family Medicine

## 2019-11-02 ENCOUNTER — Other Ambulatory Visit (HOSPITAL_COMMUNITY)
Admission: RE | Admit: 2019-11-02 | Discharge: 2019-11-02 | Disposition: A | Discharge status: Home / Self Care | Payer: Managed Care, Other (non HMO) | Source: Ambulatory Visit | Attending: Family Medicine | Admitting: Family Medicine

## 2019-11-02 VITALS — BP 110/72 | HR 86 | Resp 16

## 2019-11-02 DIAGNOSIS — E785 Hyperlipidemia, unspecified: Secondary | ICD-10-CM | POA: Diagnosis not present

## 2019-11-02 DIAGNOSIS — E039 Hypothyroidism, unspecified: Secondary | ICD-10-CM

## 2019-11-02 DIAGNOSIS — Z0000 Encounter for general adult medical examination without abnormal findings: Secondary | ICD-10-CM | POA: Diagnosis not present

## 2019-11-02 DIAGNOSIS — Z124 Encounter for screening for malignant neoplasm of cervix: Secondary | ICD-10-CM | POA: Diagnosis present

## 2019-11-02 DIAGNOSIS — Z131 Encounter for screening for diabetes mellitus: Secondary | ICD-10-CM

## 2019-11-02 DIAGNOSIS — Z130 Encounter for screening for diseases of the blood and blood-forming organs and certain disorders involving the immune mechanism: Secondary | ICD-10-CM

## 2019-11-02 DIAGNOSIS — Z1159 Encounter for screening for other viral diseases: Secondary | ICD-10-CM

## 2019-11-02 DIAGNOSIS — F411 Generalized anxiety disorder: Secondary | ICD-10-CM

## 2019-11-02 MED ORDER — SERTRALINE HCL 50 MG PO TABS: 50.0000 mg | ORAL_TABLET | Freq: Every day | ORAL | 3 refills | Status: DC

## 2019-11-03 ENCOUNTER — Encounter: Payer: Self-pay | Admitting: Family Medicine

## 2019-11-03 LAB — HEMOGLOBIN A1C
Hgb A1c MFr Bld: 5.6 % of total Hgb (ref <5.7)
Mean Plasma Glucose: 114 (calc)
eAG (mmol/L): 6.3 (calc)

## 2019-11-03 LAB — COMPREHENSIVE METABOLIC PANEL
AG Ratio: 1.9 (calc) (ref 1.0–2.5)
ALT: 8 U/L (ref 6–29)
AST: 19 U/L (ref 10–35)
Albumin: 4.5 g/dL (ref 3.6–5.1)
Alkaline phosphatase (APISO): 54 U/L (ref 37–153)
BUN: 13 mg/dL (ref 7–25)
CO2: 25 mmol/L (ref 20–32)
Calcium: 9.4 mg/dL (ref 8.6–10.4)
Chloride: 102 mmol/L (ref 98–110)
Creat: 0.83 mg/dL (ref 0.50–1.05)
Globulin: 2.4 g/dL (calc) (ref 1.9–3.7)
Glucose, Bld: 91 mg/dL (ref 65–99)
Potassium: 4.5 mmol/L (ref 3.5–5.3)
Sodium: 139 mmol/L (ref 135–146)
Total Bilirubin: 0.7 mg/dL (ref 0.2–1.2)
Total Protein: 6.9 g/dL (ref 6.1–8.1)

## 2019-11-03 LAB — CBC
HCT: 41.1 % (ref 35.0–45.0)
Hemoglobin: 13.5 g/dL (ref 11.7–15.5)
MCH: 29 pg (ref 27.0–33.0)
MCHC: 32.8 g/dL (ref 32.0–36.0)
MCV: 88.4 fL (ref 80.0–100.0)
MPV: 11.1 fL (ref 7.5–12.5)
Platelets: 218 10*3/uL (ref 140–400)
RBC: 4.65 10*6/uL (ref 3.80–5.10)
RDW: 12.2 % (ref 11.0–15.0)
WBC: 5.7 10*3/uL (ref 3.8–10.8)

## 2019-11-03 LAB — LIPID PANEL
Cholesterol: 193 mg/dL (ref <200)
HDL: 54 mg/dL (ref >=50)
LDL Cholesterol (Calc): 112 mg/dL (calc) — ABNORMAL HIGH
Non-HDL Cholesterol (Calc): 139 mg/dL (calc) — ABNORMAL HIGH (ref <130)
Total CHOL/HDL Ratio: 3.6 (calc) (ref <5.0)
Triglycerides: 157 mg/dL — ABNORMAL HIGH (ref <150)

## 2019-11-03 LAB — CYTOLOGY - PAP
Adequacy: ABSENT
Comment: NEGATIVE
Diagnosis: NEGATIVE
High risk HPV: NEGATIVE

## 2019-11-03 LAB — HEPATITIS C ANTIBODY
Hepatitis C Ab: NONREACTIVE
SIGNAL TO CUT-OFF: 0.01 (ref <1.00)

## 2019-11-03 LAB — TSH: TSH: 1.03 mIU/L

## 2020-04-19 ENCOUNTER — Encounter: Payer: Self-pay | Admitting: Family Medicine

## 2020-04-19 DIAGNOSIS — E039 Hypothyroidism, unspecified: Secondary | ICD-10-CM

## 2020-04-20 NOTE — Progress Notes (Deleted)
[facility] at [facility] [address] [place], [place] [address] [phone]  Date:  04/24/2020   Name:  Dana Kramer   DOB:  20-Jun-1965   MRN:  [id number]  PCP:  Darreld Mclean, MD    Chief Complaint: No chief complaint on file.   History of Present Illness:  Dana Kramer is a 55 y.o. 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 Enjoys exercise at [facility] gets regular exercise, no chest pain or shortness of breath  Most recent labs done in August   Lab Results  Component Value Date   TSH 1.03 11/02/2019    Patient Active Problem List   Diagnosis Date Noted  . Acquired hypothyroidism 04/14/2018  . Migraine 01/18/2015  . Insomnia 11/05/2011  . Anxiety 11/05/2011  . Hyperlipidemia 11/05/2011  . Tachycardia 11/05/2011  . Umbilical hernia 54/00/8676    Past Medical History:  Diagnosis Date  . Chicken pox   . History of transfusion of whole blood   . Hyperlipidemia   . Migraine     Past Surgical History:  Procedure Laterality Date  . ABDOMINOPLASTY  10/01/2010  . CESAREAN SECTION     twice  . umblica hernia repair  1/95/0932    Social History   Tobacco Use  . Smoking status: Never Smoker  . Smokeless tobacco: Never Used  Substance Use Topics  . Alcohol use: No  . Drug use: No    Family History  Problem Relation Age of Onset  . Hypertension Mother   . Cancer Father     Allergies  Allergen Reactions  . Penicillins Rash    Mainly upper body only.    Medication list has been reviewed and updated.  Current Outpatient Medications on File Prior to Visit  Medication Sig Dispense Refill  . Calcium 200 MG TABS Take 1 tablet by mouth  daily.    . Cholecalciferol (VITAMIN D PO) Take 1 tablet by mouth daily.    Marland Kitchen levothyroxine (SYNTHROID) 25 MCG tablet Take 1 tablet (25 mcg total) by mouth daily before breakfast. 90 tablet 3  . metoprolol succinate (TOPROL-XL) 25 MG 24 hr tablet Take 1 tablet (25 mg total) by mouth daily. 90 tablet 3  . Multiple Vitamins-Minerals (WOMENS MULTIVITAMIN PO) Take 1 tablet by mouth daily.    . pantoprazole (PROTONIX) 20 MG tablet Take 20 mg by mouth daily.    . sertraline (ZOLOFT) 50 MG tablet Take 1 tablet (50 mg total) by mouth daily. 90 tablet 3  . simvastatin (ZOCOR) 20 MG tablet Take 1 tablet (20 mg total) by mouth at bedtime. 90 tablet 3   No current facility-administered medications on file prior to visit.    Review of Systems:  As per HPI- otherwise negative.   Physical Examination: There were no vitals filed for this visit. There were no vitals filed for this visit. There is no height or weight on file to calculate BMI. Ideal Body Weight:    GEN: no acute distress. HEENT: Atraumatic, Normocephalic.  Ears and Nose: No external deformity. CV: RRR, No M/G/R. No JVD. No thrill. No extra heart sounds. PULM: CTA B, no wheezes, crackles, rhonchi. No retractions. No resp. distress. No accessory  muscle use. ABD: S, NT, ND, +BS. No rebound. No HSM. EXTR: No c/c/e PSYCH: Normally interactive. Conversant.    Assessment and Plan: *** This visit occurred during the SARS-CoV-2 public health emergency.  Safety protocols were in place, including screening questions prior to the visit, additional usage of staff PPE, and extensive cleaning of exam room while observing appropriate contact time as indicated for disinfecting solutions.    Signed Lamar Blinks, MD

## 2020-04-20 NOTE — Patient Instructions (Incomplete)
Good to see you again today!  Assuming all is well we can visit in about 6 months

## 2020-04-24 ENCOUNTER — Other Ambulatory Visit (INDEPENDENT_AMBULATORY_CARE_PROVIDER_SITE_OTHER): Payer: Managed Care, Other (non HMO)

## 2020-04-24 ENCOUNTER — Other Ambulatory Visit: Payer: Self-pay

## 2020-04-24 ENCOUNTER — Ambulatory Visit: Payer: Managed Care, Other (non HMO) | Admitting: Family Medicine

## 2020-04-24 ENCOUNTER — Encounter: Payer: Self-pay | Admitting: Family Medicine

## 2020-04-24 DIAGNOSIS — E039 Hypothyroidism, unspecified: Secondary | ICD-10-CM | POA: Diagnosis not present

## 2020-04-24 DIAGNOSIS — F411 Generalized anxiety disorder: Secondary | ICD-10-CM

## 2020-04-24 DIAGNOSIS — E785 Hyperlipidemia, unspecified: Secondary | ICD-10-CM

## 2020-04-24 DIAGNOSIS — Z5181 Encounter for therapeutic drug level monitoring: Secondary | ICD-10-CM

## 2020-04-24 LAB — TSH: TSH: 4.36 u[IU]/mL (ref 0.35–4.50)

## 2020-05-14 ENCOUNTER — Other Ambulatory Visit: Payer: Self-pay | Admitting: Family Medicine

## 2020-05-14 DIAGNOSIS — E039 Hypothyroidism, unspecified: Secondary | ICD-10-CM

## 2020-06-13 ENCOUNTER — Other Ambulatory Visit: Payer: Self-pay | Admitting: Family Medicine

## 2020-06-13 DIAGNOSIS — E039 Hypothyroidism, unspecified: Secondary | ICD-10-CM

## 2020-06-14 ENCOUNTER — Encounter: Payer: Self-pay | Admitting: Family Medicine

## 2020-06-14 DIAGNOSIS — E039 Hypothyroidism, unspecified: Secondary | ICD-10-CM

## 2020-06-14 DIAGNOSIS — R000 Tachycardia, unspecified: Secondary | ICD-10-CM

## 2020-06-14 MED ORDER — METOPROLOL SUCCINATE ER 25 MG PO TB24: 25.0000 mg | ORAL_TABLET | Freq: Every day | ORAL | 3 refills | Status: DC

## 2020-06-14 MED ORDER — LEVOTHYROXINE SODIUM 25 MCG PO TABS: ORAL_TABLET | ORAL | 2 refills | Status: DC

## 2020-06-16 ENCOUNTER — Other Ambulatory Visit: Payer: Self-pay | Admitting: Family Medicine

## 2020-06-16 DIAGNOSIS — R000 Tachycardia, unspecified: Secondary | ICD-10-CM

## 2020-09-12 ENCOUNTER — Other Ambulatory Visit: Payer: Self-pay | Admitting: Family Medicine

## 2020-09-12 DIAGNOSIS — E785 Hyperlipidemia, unspecified: Secondary | ICD-10-CM

## 2020-10-20 ENCOUNTER — Other Ambulatory Visit: Payer: Self-pay | Admitting: Family Medicine

## 2020-10-20 DIAGNOSIS — F411 Generalized anxiety disorder: Secondary | ICD-10-CM

## 2020-11-04 NOTE — Progress Notes (Addendum)
[facility] at [facility] [place], [place],  [id number] [phone] [id number] [phone]  Date:  11/06/2020   Name:  Dana Kramer   DOB:  [birth year]   MRN:  [id number]  PCP:  Darreld Mclean, MD    Chief Complaint: Annual Exam   History of Present Illness:  Dana Kramer is a 55 y.o. 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   She enjoys working out at the gym-she exercises on a regular basis  Mammogram-due, ordered today COVID-19 booster Can offer flu shot-will be done at her job Pap 1 year ago, normal  Levothyroxine 25 Toprol XL 25 Zoloft Simvastatin  No chest pain or shortness of breath with exercise She notes her menstrual periods are spacing out some over the last year or so  BP Readings from Last 3 Encounters:  11/06/20 112/80  11/02/19 110/72  04/12/19 128/82    Patient Active Problem List   Diagnosis Date Noted   Acquired hypothyroidism 04/14/2018   Migraine 01/18/2015   Insomnia 11/05/2011   Anxiety 11/05/2011   Hyperlipidemia 11/05/2011   Tachycardia [id number]   Umbilical hernia 0000000    Past Medical History:  Diagnosis Date   Chicken pox    History of transfusion of whole blood    Hyperlipidemia    Migraine     Past Surgical History:  Procedure Laterality Date   ABDOMINOPLASTY  10/01/2010   CESAREAN SECTION     twice   umblica hernia repair  [id number]    Social History   Tobacco Use   Smoking status: Never   Smokeless tobacco: Never  Substance Use Topics   Alcohol use: No   Drug use: No    Family History  Problem Relation Age of Onset   Hypertension Mother    Cancer Father     Allergies  Allergen Reactions   Penicillins Rash    Mainly upper body  only.    Medication list has been reviewed and updated.  Current Outpatient Medications on File Prior to Visit  Medication Sig Dispense Refill   Calcium 200 MG TABS Take 1 tablet by mouth daily.     Cholecalciferol (VITAMIN D PO) Take 1 tablet by mouth daily.     levothyroxine (SYNTHROID) 25 MCG tablet TAKE 1 TABLET(25 MCG) BY MOUTH DAILY BEFORE BREAKFAST 90 tablet 2   metoprolol succinate (TOPROL-XL) 25 MG 24 hr tablet Take 1 tablet (25 mg total) by mouth daily. Take with or immediately following a meal. 90 tablet 1   Multiple Vitamins-Minerals (WOMENS MULTIVITAMIN PO) Take 1 tablet by mouth daily.     pantoprazole (PROTONIX) 20 MG tablet Take 20 mg by mouth daily.     sertraline (ZOLOFT) 50 MG tablet TAKE 1 TABLET(50 MG) BY MOUTH DAILY 90 tablet 3   simvastatin (ZOCOR) 20 MG tablet TAKE 1 TABLET(20 MG) BY MOUTH AT BEDTIME 90 tablet 3   No current facility-administered medications on file prior to visit.    Review of Systems:  As per HPI- otherwise negative.   Physical Examination: Vitals:   11/06/20 0948  BP: (!) 142/92  Pulse: 77  Resp: 16  Temp: (!) 97.5 F (36.4 C)  SpO2: 99%   Vitals:   11/06/20 0948  Weight:  126 lb (57.2 kg)  Height: 5' (1.524 m)   Body mass index is 24.61 kg/m. Ideal Body Weight: Weight in (lb) to have BMI = 25: 127.7  GEN: no acute distress.  Normal weight, petite build.  Looks well Bilateral TM wnl, oropharynx normal.  PEERL,EOMI.   HEENT: Atraumatic, Normocephalic.  Ears and Nose: No external deformity. CV: RRR, No M/G/R. No JVD. No thrill. No extra heart sounds. PULM: CTA B, no wheezes, crackles, rhonchi. No retractions. No resp. distress. No accessory muscle use. ABD: S, NT, ND, +BS. No rebound. No HSM. EXTR: No c/c/e PSYCH: Normally interactive. Conversant.    Assessment and Plan: Physical exam  Acquired hypothyroidism - Plan: TSH, levothyroxine (SYNTHROID) 25 MCG tablet  Screening for diabetes mellitus - Plan: Comprehensive  metabolic panel, Hemoglobin A1c  Screening for deficiency anemia - Plan: CBC  Dyslipidemia - Plan: Lipid panel  Fatigue, unspecified type - Plan: TSH, VITAMIN D 25 Hydroxy (Vit-D Deficiency, Fractures)  Tachycardia - Plan: metoprolol succinate (TOPROL-XL) 25 MG 24 hr tablet  Encounter for screening mammogram for malignant neoplasm of breast - Plan: MM 3D SCREEN BREAST BILATERAL  Patient today for physical exam.  Encouraged healthy diet and exercise routine Will plan further follow- up pending labs.  This visit occurred during the SARS-CoV-2 public health emergency.  Safety protocols were in place, including screening questions prior to the visit, additional usage of staff PPE, and extensive cleaning of exam room while observing appropriate contact time as indicated for disinfecting solutions.   Signed Lamar Blinks, MD  Received labs as below, message to pt  Results for orders placed or performed in visit on 11/06/20  CBC  Result Value Ref Range   WBC 5.7 4.0 - 10.5 K/uL   RBC 4.32 3.87 - 5.11 Mil/uL   Platelets 201.0 150.0 - 400.0 K/uL   Hemoglobin 12.4 12.0 - 15.0 g/dL   HCT 36.8 36.0 - 46.0 %   MCV 85.0 78.0 - 100.0 fl   MCHC 33.7 30.0 - 36.0 g/dL   RDW 12.3 11.5 - 15.5 %  Comprehensive metabolic panel  Result Value Ref Range   Sodium 138 135 - 145 mEq/L   Potassium 4.4 3.5 - 5.1 mEq/L   Chloride 102 96 - 112 mEq/L   CO2 30 19 - 32 mEq/L   Glucose, Bld 77 70 - 99 mg/dL   BUN 7 6 - 23 mg/dL   Creatinine, Ser 0.77 0.40 - 1.20 mg/dL   Total Bilirubin 0.7 0.2 - 1.2 mg/dL   Alkaline Phosphatase 54 39 - 117 U/L   AST 18 0 - 37 U/L   ALT 9 0 - 35 U/L   Total Protein 6.9 6.0 - 8.3 g/dL   Albumin 4.4 3.5 - 5.2 g/dL   GFR 88.37 >60.00 mL/min   Calcium 9.7 8.4 - 10.5 mg/dL  Hemoglobin A1c  Result Value Ref Range   Hgb A1c MFr Bld 6.1 4.6 - 6.5 %  Lipid panel  Result Value Ref Range   Cholesterol 195 0 - 200 mg/dL   Triglycerides 215.0 (H) 0.0 - 149.0 mg/dL   HDL  50.90 >39.00 mg/dL   VLDL 43.0 (H) 0.0 - 40.0 mg/dL   Total CHOL/HDL Ratio 4    NonHDL 144.09   TSH  Result Value Ref Range   TSH 3.53 0.35 - 5.50 uIU/mL  VITAMIN D 25 Hydroxy (Vit-D Deficiency, Fractures)  Result Value Ref Range   VITD 52.99 30.00 - 100.00 ng/mL  LDL cholesterol, direct  Result  Value Ref Range   Direct LDL 116.0 mg/dL   The 10-year ASCVD risk score Mikey Bussing DC Jr., et al., 2013) is: 1.2%   Values used to calculate the score:     Age: 88 years     Sex: Female     Is Non-Hispanic African American: No     Diabetic: No     Tobacco smoker: No     Systolic Blood Pressure: [id number] mmHg     Is BP treated: No     HDL Cholesterol: 50.9 mg/dL     Total Cholesterol: 195 mg/dL

## 2020-11-04 NOTE — Patient Instructions (Addendum)
It was good to see you again today, I will be in touch with your labs as soon as possible If you like, you can stop at the ground floor imaging dept and schedule your mammogram

## 2020-11-06 ENCOUNTER — Ambulatory Visit (INDEPENDENT_AMBULATORY_CARE_PROVIDER_SITE_OTHER): Payer: Managed Care, Other (non HMO) | Admitting: Family Medicine

## 2020-11-06 ENCOUNTER — Encounter: Payer: Self-pay | Admitting: Family Medicine

## 2020-11-06 ENCOUNTER — Other Ambulatory Visit: Payer: Self-pay

## 2020-11-06 VITALS — BP 112/80 | HR 77 | Temp 97.5°F | Resp 16 | Ht 60.0 in | Wt 126.0 lb

## 2020-11-06 DIAGNOSIS — Z0000 Encounter for general adult medical examination without abnormal findings: Secondary | ICD-10-CM

## 2020-11-06 DIAGNOSIS — E039 Hypothyroidism, unspecified: Secondary | ICD-10-CM | POA: Diagnosis not present

## 2020-11-06 DIAGNOSIS — R000 Tachycardia, unspecified: Secondary | ICD-10-CM

## 2020-11-06 DIAGNOSIS — R7303 Prediabetes: Secondary | ICD-10-CM

## 2020-11-06 DIAGNOSIS — Z130 Encounter for screening for diseases of the blood and blood-forming organs and certain disorders involving the immune mechanism: Secondary | ICD-10-CM

## 2020-11-06 DIAGNOSIS — E785 Hyperlipidemia, unspecified: Secondary | ICD-10-CM

## 2020-11-06 DIAGNOSIS — R5383 Other fatigue: Secondary | ICD-10-CM | POA: Diagnosis not present

## 2020-11-06 DIAGNOSIS — Z131 Encounter for screening for diabetes mellitus: Secondary | ICD-10-CM

## 2020-11-06 DIAGNOSIS — Z1231 Encounter for screening mammogram for malignant neoplasm of breast: Secondary | ICD-10-CM

## 2020-11-06 LAB — LIPID PANEL
Cholesterol: 195 mg/dL (ref 0–200)
HDL: 50.9 mg/dL (ref >39.00)
NonHDL: 144.09
Total CHOL/HDL Ratio: 4
Triglycerides: 215 mg/dL — ABNORMAL HIGH (ref 0.0–149.0)
VLDL: 43 mg/dL — ABNORMAL HIGH (ref 0.0–40.0)

## 2020-11-06 LAB — COMPREHENSIVE METABOLIC PANEL
ALT: 9 U/L (ref 0–35)
AST: 18 U/L (ref 0–37)
Albumin: 4.4 g/dL (ref 3.5–5.2)
Alkaline Phosphatase: 54 U/L (ref 39–117)
BUN: 7 mg/dL (ref 6–23)
CO2: 30 mEq/L (ref 19–32)
Calcium: 9.7 mg/dL (ref 8.4–10.5)
Chloride: 102 mEq/L (ref 96–112)
Creatinine, Ser: 0.77 mg/dL (ref 0.40–1.20)
GFR: 88.37 mL/min (ref >60.00)
Glucose, Bld: 77 mg/dL (ref 70–99)
Potassium: 4.4 mEq/L (ref 3.5–5.1)
Sodium: 138 mEq/L (ref 135–145)
Total Bilirubin: 0.7 mg/dL (ref 0.2–1.2)
Total Protein: 6.9 g/dL (ref 6.0–8.3)

## 2020-11-06 LAB — LDL CHOLESTEROL, DIRECT: Direct LDL: 116 mg/dL

## 2020-11-06 LAB — CBC
HCT: 36.8 % (ref 36.0–46.0)
Hemoglobin: 12.4 g/dL (ref 12.0–15.0)
MCHC: 33.7 g/dL (ref 30.0–36.0)
MCV: 85 fl (ref 78.0–100.0)
Platelets: 201 10*3/uL (ref 150.0–400.0)
RBC: 4.32 Mil/uL (ref 3.87–5.11)
RDW: 12.3 % (ref 11.5–15.5)
WBC: 5.7 10*3/uL (ref 4.0–10.5)

## 2020-11-06 LAB — VITAMIN D 25 HYDROXY (VIT D DEFICIENCY, FRACTURES): VITD: 52.99 ng/mL (ref 30.00–100.00)

## 2020-11-06 LAB — TSH: TSH: 3.53 u[IU]/mL (ref 0.35–5.50)

## 2020-11-06 LAB — HEMOGLOBIN A1C: Hgb A1c MFr Bld: 6.1 % (ref 4.6–6.5)

## 2020-11-06 MED ORDER — LEVOTHYROXINE SODIUM 25 MCG PO TABS: ORAL_TABLET | ORAL | 3 refills | Status: DC

## 2020-11-06 MED ORDER — METOPROLOL SUCCINATE ER 25 MG PO TB24: 25.0000 mg | ORAL_TABLET | Freq: Every day | ORAL | 3 refills | Status: DC

## 2020-12-10 ENCOUNTER — Other Ambulatory Visit: Payer: Self-pay

## 2020-12-10 ENCOUNTER — Ambulatory Visit (HOSPITAL_BASED_OUTPATIENT_CLINIC_OR_DEPARTMENT_OTHER)
Admission: RE | Admit: 2020-12-10 | Discharge: 2020-12-10 | Disposition: A | Discharge status: Home / Self Care | Payer: Managed Care, Other (non HMO) | Source: Ambulatory Visit | Attending: Family Medicine | Admitting: Family Medicine

## 2020-12-10 ENCOUNTER — Encounter (HOSPITAL_BASED_OUTPATIENT_CLINIC_OR_DEPARTMENT_OTHER): Payer: Self-pay

## 2020-12-10 DIAGNOSIS — Z1231 Encounter for screening mammogram for malignant neoplasm of breast: Secondary | ICD-10-CM | POA: Diagnosis not present

## 2020-12-16 ENCOUNTER — Other Ambulatory Visit: Payer: Self-pay | Admitting: Family Medicine

## 2020-12-16 DIAGNOSIS — R928 Other abnormal and inconclusive findings on diagnostic imaging of breast: Secondary | ICD-10-CM

## 2021-01-02 ENCOUNTER — Other Ambulatory Visit: Payer: Self-pay | Admitting: Family Medicine

## 2021-01-02 ENCOUNTER — Ambulatory Visit
Admission: RE | Admit: 2021-01-02 | Discharge: 2021-01-02 | Disposition: A | Discharge status: Home / Self Care | Payer: Managed Care, Other (non HMO) | Source: Ambulatory Visit | Attending: Family Medicine | Admitting: Family Medicine

## 2021-01-02 ENCOUNTER — Other Ambulatory Visit: Payer: Self-pay

## 2021-01-02 DIAGNOSIS — R928 Other abnormal and inconclusive findings on diagnostic imaging of breast: Secondary | ICD-10-CM

## 2021-01-02 DIAGNOSIS — N6310 Unspecified lump in the right breast, unspecified quadrant: Secondary | ICD-10-CM

## 2021-01-08 ENCOUNTER — Other Ambulatory Visit: Payer: Self-pay | Admitting: Family Medicine

## 2021-01-08 ENCOUNTER — Other Ambulatory Visit: Payer: Self-pay

## 2021-01-08 ENCOUNTER — Ambulatory Visit
Admission: RE | Admit: 2021-01-08 | Discharge: 2021-01-08 | Disposition: A | Discharge status: Home / Self Care | Payer: Managed Care, Other (non HMO) | Source: Ambulatory Visit | Attending: Family Medicine | Admitting: Family Medicine

## 2021-01-08 DIAGNOSIS — N6310 Unspecified lump in the right breast, unspecified quadrant: Secondary | ICD-10-CM

## 2021-01-10 ENCOUNTER — Telehealth: Payer: Self-pay | Admitting: Hematology

## 2021-01-10 NOTE — Telephone Encounter (Signed)
Spoke to patient to confirm morning clinic appointment for 11/9, packet emailed to patient

## 2021-01-13 ENCOUNTER — Encounter: Payer: Self-pay | Admitting: *Deleted

## 2021-01-13 DIAGNOSIS — Z170 Estrogen receptor positive status [ER+]: Secondary | ICD-10-CM

## 2021-01-13 DIAGNOSIS — C50411 Malignant neoplasm of upper-outer quadrant of right female breast: Secondary | ICD-10-CM

## 2021-01-13 HISTORY — DX: Estrogen receptor positive status (ER+): Z17.0

## 2021-01-14 NOTE — Progress Notes (Signed)
Radiation Oncology         [phone] ________________________________  Initial Outpatient Consultation  Name: Dana Kramer MRN: [id number]  Date: 01/15/2021  DOB: 1966/01/08  GN:[id number], Gay Filler, MD  Erroll Luna, MD   REFERRING PHYSICIAN: Erroll Luna, MD  DIAGNOSIS:    ICD-10-CM   1. Malignant neoplasm of upper-outer quadrant of right breast in female, estrogen receptor positive ([place])  C50.411    Z17.0      Cancer Staging Malignant neoplasm of upper-outer quadrant of right breast in female, estrogen receptor positive ([place]) Staging form: Breast, AJCC 8th Edition - Clinical stage from 01/08/2021: Stage IA (cT1b, cN0, cM0, G2, ER+, PR+, HER2+) - Signed by Truitt Merle, MD on 01/15/2021 Stage prefix: Initial diagnosis Nuclear grade: G2 Histologic grading system: 3 grade system    CHIEF COMPLAINT: Here to discuss management of right breast cancer  HISTORY OF PRESENT ILLNESS::Dana Kramer is a 55 y.o. female who presented with breast right abnormality on the following imaging: bilateral screening mammogram on the date of 12/10/20.  No symptoms, if any, were reported at that time.   Diagnostic right breast mammogram and right breast ultrasound on 01/02/21 further revealed a highly suspicious upper outer right breast mass with associated suspicious calcifications extending 1.5 cm anteriorly. No abnormal appearing right axillary lymph nodes were appreciated.     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 No lymph nodes were examined.  ER status: 100% positive, PR status 70% positive, both with strong staining intensity; Her2 status pending; Grade 2.  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  She loves her work and would like to continue it through treatment.  She anticipates adjuvant chemotherapy.  She is here with her daughter today.   She is also spoken with her surgeon and would like to pursue bilateral mastectomies for peace of mind.  PREVIOUS RADIATION THERAPY: No  PAST MEDICAL HISTORY:  has a past medical history of Allergy (11/1998), Chicken pox, History of transfusion of whole blood, Hyperlipidemia, Malignant neoplasm of upper-outer quadrant of right breast in female, estrogen receptor positive ([place]) (01/13/2021), Migraine, and Thyroid disease.    PAST SURGICAL HISTORY: Past Surgical History:  Procedure Laterality Date   ABDOMINOPLASTY  10/01/2010   CESAREAN SECTION     twice   HERNIA REPAIR     TUBAL LIGATION     umblica hernia repair  88/91/6945    FAMILY HISTORY: family history includes Cancer (age of onset: 35) in her father; Colon cancer in her paternal grandfather; Hypertension in her mother; Leukemia in her maternal grandfather; Lung cancer in her cousin.  SOCIAL HISTORY:  reports that she has never smoked. She has never used smokeless tobacco. She reports that she does not drink alcohol and does not use drugs.  ALLERGIES: Penicillins  MEDICATIONS:  Current Outpatient Medications  Medication Sig Dispense Refill   Calcium 200 MG TABS Take 1 tablet by mouth daily.     Cholecalciferol (VITAMIN D PO) Take 1 tablet by mouth daily.     levothyroxine (SYNTHROID) 25 MCG tablet TAKE 1 TABLET(25 MCG) BY MOUTH DAILY BEFORE BREAKFAST 90 tablet 3   metoprolol succinate (TOPROL-XL) 25 MG 24 hr tablet Take 1 tablet (25 mg total) by mouth daily. Take with or immediately following a meal. 90 tablet 3   Multiple Vitamins-Minerals (WOMENS MULTIVITAMIN PO) Take 1 tablet by mouth  daily.     pantoprazole (PROTONIX) 20 MG tablet Take 20 mg by mouth daily.     sertraline (ZOLOFT) 50 MG tablet TAKE 1 TABLET(50 MG) BY MOUTH DAILY 90 tablet 3   simvastatin (ZOCOR) 20 MG tablet TAKE 1 TABLET(20 MG) BY MOUTH AT BEDTIME 90 tablet 3   No current facility-administered medications for this encounter.    REVIEW OF SYSTEMS: As  above in HPI.   PHYSICAL EXAM:  vitals were not taken for this visit.   General: Alert and oriented, in no acute distress HEENT: Head is normocephalic. Extraocular movements are intact. Oropharynx is clear. Neck: Neck is supple, no palpable cervical or supraclavicular lymphadenopathy. Heart: Regular in rate and rhythm with no murmurs, rubs, or gallops. Chest: Clear to auscultation bilaterally, with no rhonchi, wheezes, or rales. Extremities: No cyanosis or edema. Lymphatics: see Neck Exam Skin: No concerning lesions. Musculoskeletal: symmetric strength and muscle tone throughout. Neurologic: Cranial nerves II through XII are grossly intact. No obvious focalities. Speech is fluent. Coordination is intact. Psychiatric: Judgment and insight are intact. Affect is appropriate. Breasts: There is thickening at the 9:00 region of the right breast, consistent with recent biopsy. No other palpable masses appreciated in the breasts or axillae bilaterally.    ECOG = 0  0 - Asymptomatic (Fully active, able to carry on all predisease activities without restriction)  1 - Symptomatic but completely ambulatory (Restricted in physically strenuous activity but ambulatory and able to carry out work of a light or sedentary nature. For example, light housework, office work)  2 - Symptomatic, <50% in bed during the day (Ambulatory and capable of all self care but unable to carry out any work activities. Up and about more than 50% of waking hours)  3 - Symptomatic, >50% in bed, but not bedbound (Capable of only limited self-care, confined to bed or chair 50% or more of waking hours)  4 - Bedbound (Completely disabled. Cannot carry on any self-care. Totally confined to bed or chair)  5 - Death   Eustace Pen MM, Creech RH, Tormey DC, et al. [phone]). "Toxicity and response criteria of the [facility]". [place] Oncol. 5 (6): 649-55   LABORATORY DATA:  Lab Results  Component Value Date    WBC 5.8 01/15/2021   HGB 11.8 (L) 01/15/2021   HCT 36.4 01/15/2021   MCV 88.1 01/15/2021   PLT 194 01/15/2021   CMP     Component Value Date/Time   NA 142 01/15/2021 0757   K 3.5 01/15/2021 0757   CL 108 01/15/2021 0757   CO2 24 01/15/2021 0757   GLUCOSE 105 (H) 01/15/2021 0757   BUN 11 01/15/2021 0757   CREATININE 0.83 01/15/2021 0757   CREATININE 0.83 11/02/2019 1131   CALCIUM 9.2 01/15/2021 0757   PROT 7.3 01/15/2021 0757   ALBUMIN 4.1 01/15/2021 0757   AST 19 01/15/2021 0757   ALT 9 01/15/2021 0757   ALKPHOS 59 01/15/2021 0757   BILITOT 0.3 01/15/2021 0757   GFRNONAA >60 01/15/2021 0757         RADIOGRAPHY: US BREAST LTD UNI LEFT INC AXILLA  Result Date: 01/02/2021 CLINICAL DATA:  55 year old female for further evaluation of possible RIGHT breast mass with adjacent calcifications and possible LEFT breast mass. EXAM: DIGITAL DIAGNOSTIC BILATERAL MAMMOGRAM WITH TOMOSYNTHESIS AND CAD; ULTRASOUND LEFT BREAST LIMITED; ULTRASOUND RIGHT BREAST LIMITED TECHNIQUE: Bilateral digital diagnostic mammography and breast tomosynthesis was performed. The images were evaluated with computer-aided detection.; Targeted ultrasound examination of the  left breast was performed.; Targeted ultrasound examination of the right breast was performed COMPARISON:  Previous exam(s). ACR Breast Density Category c: The breast tissue is heterogeneously dense, which may obscure small masses. FINDINGS: Spot compression views of both breasts and full field and magnification views of the RIGHT breast are performed. RIGHT breast: A persistent irregular mass within the UPPER-OUTER RIGHT breast, middle depth, with suspicious calcifications extending 1.5 cm anterior from the mass. LEFT breast: A persistent circumscribed oval mass within the posterior LOWER LEFT breast is identified. Targeted ultrasound is performed, showing the following: RIGHT: A 0.7 x 0.7 x 0.7 cm irregular hypoechoic mass at the 11 o'clock position of  the RIGHT breast 5 cm from the nipple. No abnormal RIGHT axillary lymph nodes are noted. LEFT: A 1.1 x 0.7 x 0.9 cm benign simple cyst at the 6 o'clock position of the LEFT breast 3 cm from the nipple, corresponding the mammographic finding. IMPRESSION: 1. Highly suspicious 0.7 cm UPPER-OUTER RIGHT breast mass with associated suspicious calcifications extending 1.5 cm anteriorly. Tissue sampling is recommended. 2. No abnormal appearing RIGHT axillary lymph nodes. 3. Benign LOWER LEFT breast cyst corresponding to the LEFT breast screening study finding. RECOMMENDATION: 1. Ultrasound-guided biopsy of the UPPER-OUTER RIGHT breast mass. Associated calcifications extending anteriorly are considered the a part of the same process and can be bracketed if surgical excision is necessary. I have discussed the findings and recommendations with the patient. If applicable, a reminder letter will be sent to the patient regarding the next appointment. BI-RADS CATEGORY  5: Highly suggestive of malignancy. Electronically Signed   By: Margarette Canada M.D.   On: 01/02/2021 10:54  US BREAST LTD UNI RIGHT INC AXILLA  Result Date: 01/02/2021 CLINICAL DATA:  55 year old female for further evaluation of possible RIGHT breast mass with adjacent calcifications and possible LEFT breast mass. EXAM: DIGITAL DIAGNOSTIC BILATERAL MAMMOGRAM WITH TOMOSYNTHESIS AND CAD; ULTRASOUND LEFT BREAST LIMITED; ULTRASOUND RIGHT BREAST LIMITED TECHNIQUE: Bilateral digital diagnostic mammography and breast tomosynthesis was performed. The images were evaluated with computer-aided detection.; Targeted ultrasound examination of the left breast was performed.; Targeted ultrasound examination of the right breast was performed COMPARISON:  Previous exam(s). ACR Breast Density Category c: The breast tissue is heterogeneously dense, which may obscure small masses. FINDINGS: Spot compression views of both breasts and full field and magnification views of the RIGHT  breast are performed. RIGHT breast: A persistent irregular mass within the UPPER-OUTER RIGHT breast, middle depth, with suspicious calcifications extending 1.5 cm anterior from the mass. LEFT breast: A persistent circumscribed oval mass within the posterior LOWER LEFT breast is identified. Targeted ultrasound is performed, showing the following: RIGHT: A 0.7 x 0.7 x 0.7 cm irregular hypoechoic mass at the 11 o'clock position of the RIGHT breast 5 cm from the nipple. No abnormal RIGHT axillary lymph nodes are noted. LEFT: A 1.1 x 0.7 x 0.9 cm benign simple cyst at the 6 o'clock position of the LEFT breast 3 cm from the nipple, corresponding the mammographic finding. IMPRESSION: 1. Highly suspicious 0.7 cm UPPER-OUTER RIGHT breast mass with associated suspicious calcifications extending 1.5 cm anteriorly. Tissue sampling is recommended. 2. No abnormal appearing RIGHT axillary lymph nodes. 3. Benign LOWER LEFT breast cyst corresponding to the LEFT breast screening study finding. RECOMMENDATION: 1. Ultrasound-guided biopsy of the UPPER-OUTER RIGHT breast mass. Associated calcifications extending anteriorly are considered the a part of the same process and can be bracketed if surgical excision is necessary. I have discussed the findings and recommendations with the  patient. If applicable, a reminder letter will be sent to the patient regarding the next appointment. BI-RADS CATEGORY  5: Highly suggestive of malignancy. Electronically Signed   By: Margarette Canada M.D.   On: 01/02/2021 10:54  MM DIAG BREAST TOMO BILATERAL  Result Date: 01/02/2021 CLINICAL DATA:  55 year old female for further evaluation of possible RIGHT breast mass with adjacent calcifications and possible LEFT breast mass. EXAM: DIGITAL DIAGNOSTIC BILATERAL MAMMOGRAM WITH TOMOSYNTHESIS AND CAD; ULTRASOUND LEFT BREAST LIMITED; ULTRASOUND RIGHT BREAST LIMITED TECHNIQUE: Bilateral digital diagnostic mammography and breast tomosynthesis was performed. The  images were evaluated with computer-aided detection.; Targeted ultrasound examination of the left breast was performed.; Targeted ultrasound examination of the right breast was performed COMPARISON:  Previous exam(s). ACR Breast Density Category c: The breast tissue is heterogeneously dense, which may obscure small masses. FINDINGS: Spot compression views of both breasts and full field and magnification views of the RIGHT breast are performed. RIGHT breast: A persistent irregular mass within the UPPER-OUTER RIGHT breast, middle depth, with suspicious calcifications extending 1.5 cm anterior from the mass. LEFT breast: A persistent circumscribed oval mass within the posterior LOWER LEFT breast is identified. Targeted ultrasound is performed, showing the following: RIGHT: A 0.7 x 0.7 x 0.7 cm irregular hypoechoic mass at the 11 o'clock position of the RIGHT breast 5 cm from the nipple. No abnormal RIGHT axillary lymph nodes are noted. LEFT: A 1.1 x 0.7 x 0.9 cm benign simple cyst at the 6 o'clock position of the LEFT breast 3 cm from the nipple, corresponding the mammographic finding. IMPRESSION: 1. Highly suspicious 0.7 cm UPPER-OUTER RIGHT breast mass with associated suspicious calcifications extending 1.5 cm anteriorly. Tissue sampling is recommended. 2. No abnormal appearing RIGHT axillary lymph nodes. 3. Benign LOWER LEFT breast cyst corresponding to the LEFT breast screening study finding. RECOMMENDATION: 1. Ultrasound-guided biopsy of the UPPER-OUTER RIGHT breast mass. Associated calcifications extending anteriorly are considered the a part of the same process and can be bracketed if surgical excision is necessary. I have discussed the findings and recommendations with the patient. If applicable, a reminder letter will be sent to the patient regarding the next appointment. BI-RADS CATEGORY  5: Highly suggestive of malignancy. Electronically Signed   By: Margarette Canada M.D.   On: 01/02/2021 10:54  MM CLIP  PLACEMENT RIGHT  Result Date: 01/08/2021 CLINICAL DATA:  Confirmation of clip placement after ultrasound-guided core needle biopsy of a highly suspicious 7 mm mass in the UPPER OUTER QUADRANT of the RIGHT breast. EXAM: 2D and 3D DIAGNOSTIC RIGHT MAMMOGRAM POST ULTRASOUND BIOPSY COMPARISON:  Previous exam(s). FINDINGS: Tomosynthesis and synthesized full field CC and mediolateral images were obtained following ultrasound guided biopsy of a mass involving the UPPER OUTER QUADRANT of the RIGHT breast. The ribbon shaped tissue marking clip is appropriately positioned within the biopsied mass in the [place]. Suspicious calcifications extend approximately 1.6 cm anterior to the ribbon clip. Expected post biopsy changes are present without evidence of hematoma. IMPRESSION: Appropriate positioning of the ribbon shaped tissue marking clip within the biopsied mass in the [place] of the RIGHT breast. Suspicious calcifications extend approximately 1.6 cm anterior to the ribbon clip. Final Assessment: Post Procedure Mammograms for Marker Placement Electronically Signed   By: Evangeline Dakin M.D.   On: 01/08/2021 09:02  [place] RT BREAST BX W LOC DEV 1ST LESION IMG BX SPEC US GUIDE  Addendum Date: 01/10/2021   ADDENDUM REPORT: 01/10/2021 12:26 ADDENDUM: Pathology revealed GRADE II INVASIVE DUCTAL CARCINOMA, DUCTAL  CARCINOMA IN SITU WITH CALCIFICATIONS of the RIGHT breast, upper outer quadrant, 11 o'clock, 5cmfn, (ribbon clip). This was found to be concordant by Dr. Peggye Fothergill. Pathology results were discussed with the patient by telephone. The patient reported doing well after the biopsy with tenderness at the site. Post biopsy instructions and care were reviewed and questions were answered. The patient was encouraged to call [facility] for any additional concerns. My direct phone number was provided. The patient was referred to [facility] on January 15, 2021. NOTE: Associated calcifications extending anteriorly are considered the a part of the same process and can be bracketed if breast conservation is a consideration. Pathology results reported by Terie Purser, RN on 01/10/2021. Electronically Signed   By: Evangeline Dakin M.D.   On: 01/10/2021 12:26   Result Date: 01/10/2021 CLINICAL DATA:  55 year old with a screening detected highly suspicious 7 mm mass associated with architectural distortion and suspicious calcifications in the UPPER OUTER QUADRANT of the RIGHT breast at the 11 o'clock position 5 cm from nipple at middle to posterior depth. EXAM: ULTRASOUND GUIDED RIGHT BREAST CORE NEEDLE BIOPSY COMPARISON:  Previous exam(s). PROCEDURE: I met with the patient and we discussed the procedure of ultrasound-guided biopsy, including benefits and alternatives. We discussed the high likelihood of a successful procedure. We discussed the risks of the procedure, including infection, bleeding, tissue injury, clip migration, and inadequate sampling. Informed written consent was given. The usual time-out protocol was performed immediately prior to the procedure. Lesion quadrant: UPPER OUTER QUADRANT. Using sterile technique with chlorhexidine as skin antisepsis, 1% Lidocaine as local anesthetic, under direct ultrasound visualization, a 12 gauge Bard Marquee core needle device placed through an 11 gauge introducer needle was used to perform biopsy of the mass in the UPPER OUTER QUADRANT at 11 o'clock 5 cm from the nipple using a lateral approach. At the conclusion of the procedure a ribbon shaped tissue marker clip was deployed into the biopsy cavity. Follow up 2 view mammogram was performed and dictated separately. IMPRESSION: Ultrasound guided biopsy of a highly suspicious 7 mm mass involving the UPPER OUTER QUADRANT of the RIGHT breast. No apparent complications. Electronically Signed: By: Evangeline Dakin  M.D. 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  She understands that unless she is found to have a genetic mutation this would not improve her life expectancy.  Nevertheless she is heavily leaning towards undergoing bilateral mastectomies  We discussed the indications for postmastectomy radiation such as large tumor, positive margins, or positive lymph nodes.  It is unlikely that she will need postmastectomy radiation based on her clinical staging.  I will see her back on an as-needed basis.  She is pleased with this plan.  All questions were answered to her satisfaction and her daughter's satisfaction.   On date of service, in total, I spent 45 minutes on this encounter. Patient was seen in person.   __________________________________________   Eppie Gibson, MD  This document serves as a record of services personally performed by Eppie Gibson, MD. It was created on her behalf by Roney Mans, a trained medical scribe. The creation of this record is based on the scribe's personal observations and the provider's statements  to them. This document has been checked and approved by the attending provider.  

## 2021-01-15 ENCOUNTER — Other Ambulatory Visit: Payer: Self-pay

## 2021-01-15 ENCOUNTER — Ambulatory Visit (HOSPITAL_BASED_OUTPATIENT_CLINIC_OR_DEPARTMENT_OTHER): Payer: Managed Care, Other (non HMO) | Admitting: Genetic Counselor

## 2021-01-15 ENCOUNTER — Encounter: Payer: Self-pay | Admitting: Physical Therapy

## 2021-01-15 ENCOUNTER — Inpatient Hospital Stay (HOSPITAL_BASED_OUTPATIENT_CLINIC_OR_DEPARTMENT_OTHER): Payer: Managed Care, Other (non HMO) | Admitting: Hematology

## 2021-01-15 ENCOUNTER — Encounter: Payer: Self-pay | Admitting: Hematology

## 2021-01-15 ENCOUNTER — Other Ambulatory Visit: Payer: Self-pay | Admitting: *Deleted

## 2021-01-15 ENCOUNTER — Ambulatory Visit: Payer: Managed Care, Other (non HMO) | Attending: Surgery | Admitting: Physical Therapy

## 2021-01-15 ENCOUNTER — Ambulatory Visit: Payer: Self-pay | Admitting: Surgery

## 2021-01-15 ENCOUNTER — Encounter: Payer: Self-pay | Admitting: *Deleted

## 2021-01-15 ENCOUNTER — Encounter: Payer: Self-pay | Admitting: Radiation Oncology

## 2021-01-15 ENCOUNTER — Encounter: Payer: Self-pay | Admitting: Genetic Counselor

## 2021-01-15 ENCOUNTER — Inpatient Hospital Stay: Payer: Managed Care, Other (non HMO)

## 2021-01-15 ENCOUNTER — Ambulatory Visit
Admission: RE | Admit: 2021-01-15 | Discharge: 2021-01-15 | Disposition: A | Discharge status: Home / Self Care | Payer: Managed Care, Other (non HMO) | Source: Ambulatory Visit | Attending: Radiation Oncology | Admitting: Radiation Oncology

## 2021-01-15 VITALS — BP 170/99 | HR 85 | Temp 97.7°F | Resp 18 | Ht 60.0 in | Wt 128.3 lb

## 2021-01-15 DIAGNOSIS — Z800 Family history of malignant neoplasm of digestive organs: Secondary | ICD-10-CM | POA: Insufficient documentation

## 2021-01-15 DIAGNOSIS — Z170 Estrogen receptor positive status [ER+]: Secondary | ICD-10-CM

## 2021-01-15 DIAGNOSIS — Z801 Family history of malignant neoplasm of trachea, bronchus and lung: Secondary | ICD-10-CM | POA: Insufficient documentation

## 2021-01-15 DIAGNOSIS — E039 Hypothyroidism, unspecified: Secondary | ICD-10-CM

## 2021-01-15 DIAGNOSIS — R293 Abnormal posture: Secondary | ICD-10-CM | POA: Insufficient documentation

## 2021-01-15 DIAGNOSIS — Z79899 Other long term (current) drug therapy: Secondary | ICD-10-CM | POA: Insufficient documentation

## 2021-01-15 DIAGNOSIS — C50411 Malignant neoplasm of upper-outer quadrant of right female breast: Secondary | ICD-10-CM | POA: Insufficient documentation

## 2021-01-15 LAB — CBC WITH DIFFERENTIAL (CANCER CENTER ONLY)
Abs Immature Granulocytes: 0 10*3/uL (ref 0.00–0.07)
Basophils Absolute: 0 10*3/uL (ref 0.0–0.1)
Basophils Relative: 1 %
Eosinophils Absolute: 0.2 10*3/uL (ref 0.0–0.5)
Eosinophils Relative: 4 %
HCT: 36.4 % (ref 36.0–46.0)
Hemoglobin: 11.8 g/dL — ABNORMAL LOW (ref 12.0–15.0)
Immature Granulocytes: 0 %
Lymphocytes Relative: 39 %
Lymphs Abs: 2.3 10*3/uL (ref 0.7–4.0)
MCH: 28.6 pg (ref 26.0–34.0)
MCHC: 32.4 g/dL (ref 30.0–36.0)
MCV: 88.1 fL (ref 80.0–100.0)
Monocytes Absolute: 0.4 10*3/uL (ref 0.1–1.0)
Monocytes Relative: 7 %
Neutro Abs: 2.9 10*3/uL (ref 1.7–7.7)
Neutrophils Relative %: 49 %
Platelet Count: 194 10*3/uL (ref 150–400)
RBC: 4.13 MIL/uL (ref 3.87–5.11)
RDW: 12.1 % (ref 11.5–15.5)
WBC Count: 5.8 10*3/uL (ref 4.0–10.5)
nRBC: 0 % (ref 0.0–0.2)

## 2021-01-15 LAB — CMP (CANCER CENTER ONLY)
ALT: 9 U/L (ref 0–44)
AST: 19 U/L (ref 15–41)
Albumin: 4.1 g/dL (ref 3.5–5.0)
Alkaline Phosphatase: 59 U/L (ref 38–126)
Anion gap: 10 (ref 5–15)
BUN: 11 mg/dL (ref 6–20)
CO2: 24 mmol/L (ref 22–32)
Calcium: 9.2 mg/dL (ref 8.9–10.3)
Chloride: 108 mmol/L (ref 98–111)
Creatinine: 0.83 mg/dL (ref 0.44–1.00)
GFR, Estimated: >60 mL/min (ref >60)
Glucose, Bld: 105 mg/dL — ABNORMAL HIGH (ref 70–99)
Potassium: 3.5 mmol/L (ref 3.5–5.1)
Sodium: 142 mmol/L (ref 135–145)
Total Bilirubin: 0.3 mg/dL (ref 0.3–1.2)
Total Protein: 7.3 g/dL (ref 6.5–8.1)

## 2021-01-15 LAB — GENETIC SCREENING ORDER

## 2021-01-15 NOTE — Therapy (Signed)
[facility] [place], [place], [address] Phone: [phone]   Fax:  [phone]  Physical Therapy Evaluation  Patient Details  Name: Dana Kramer MRN: [id number] Date of Birth: March 27, 1965 Referring Provider (PT): Dr. Erroll Luna   Encounter Date: 01/15/2021   PT End of Session - 01/15/21 1107     Visit Number 1    Number of Visits 2    Date for PT Re-Evaluation 03/12/21    PT Start Time 1016    PT Stop Time 1048    PT Time Calculation (min) 32 min    Activity Tolerance Patient tolerated treatment well    Behavior During Therapy [name] for tasks assessed/performed             Past Medical History:  Diagnosis Date   Allergy 11/1998   Penicillin   Chicken pox    History of transfusion of whole blood    Hyperlipidemia    Malignant neoplasm of upper-outer quadrant of right breast in female, estrogen receptor positive ([place]) 01/13/2021   Migraine    Thyroid disease    Hypothyroidism    Past Surgical History:  Procedure Laterality Date   ABDOMINOPLASTY  10/01/2010   CESAREAN SECTION     twice   HERNIA REPAIR     TUBAL LIGATION     umblica hernia repair  74/02/8785    There were no vitals filed for this visit.    Subjective Assessment - 01/15/21 1059     Subjective Patient reports she is here today to be seen by her medical team for her newly diagnosed right breast cancer.    Patient is accompained by: Family member    Pertinent History Patient was diagnosed on 12/10/2020 with right grade II invasive ductal carcinoma breast cancer. It measures 1.5 cm and is located in the upper outer quadrant. It is ER/PR positive and HER2 negative with a Ki67 of 10%.    Patient Stated Goals Reduce lymphedema risk and learn post op HEP    Currently in Pain? No/denies                [facility] PT Assessment - 01/15/21 0001       Assessment   Medical Diagnosis Right breast cancer    Referring Provider (PT) Dr.  Marcello Moores Cornett    Onset Date/Surgical Date 12/10/20    Hand Dominance Right    Prior Therapy none      Precautions   Precautions Other (comment)    Precaution Comments active cancer      Restrictions   Weight Bearing Restrictions No      Balance Screen   Has the patient fallen in the past 6 months No    Has the patient had a decrease in activity level because of a fear of falling?  No    Is the patient reluctant to leave their home because of a fear of falling?  No      Home Environment   Living Environment Private residence    Living Arrangements Spouse/significant other;Children   Husband and 2 adult kids   Available Help at Discharge Family      Prior Function   Level of Independence Independent    Vocation Full time employment    Chief Technology Officer at [employer] She runs, walks, and does the Metallurgist 5x/week for 45 min      Cognition   Overall Cognitive Status Within Functional Limits  for tasks assessed      Posture/Postural Control   Posture/Postural Control Postural limitations    Postural Limitations Rounded Shoulders;Forward head      ROM / Strength   AROM / PROM / Strength AROM;Strength      AROM   Overall AROM Comments Cervical AROM is WNL    AROM Assessment Site Shoulder    Right/Left Shoulder Right;Left    Right Shoulder Extension 37 Degrees    Right Shoulder Flexion 161 Degrees    Right Shoulder ABduction 180 Degrees    Right Shoulder Internal Rotation 75 Degrees    Right Shoulder External Rotation 82 Degrees    Left Shoulder Extension 49 Degrees    Left Shoulder Flexion 155 Degrees    Left Shoulder ABduction 180 Degrees    Left Shoulder Internal Rotation 77 Degrees    Left Shoulder External Rotation 87 Degrees      Strength   Overall Strength Within functional limits for tasks performed               LYMPHEDEMA/ONCOLOGY QUESTIONNAIRE - 01/15/21 0001       Type   Cancer Type Right breast cancer       Lymphedema Assessments   Lymphedema Assessments Upper extremities      Right Upper Extremity Lymphedema   10 cm Proximal to Olecranon Process 26.8 cm    Olecranon Process 23 cm    10 cm Proximal to Ulnar Styloid Process 21.4 cm    Just Proximal to Ulnar Styloid Process 14.4 cm    Across Hand at [employer] 18.2 cm    At [place] of 2nd Digit 6 cm      Left Upper Extremity Lymphedema   10 cm Proximal to Olecranon Process 28.5 cm    Olecranon Process 24 cm    10 cm Proximal to Ulnar Styloid Process 22 cm    Just Proximal to Ulnar Styloid Process 14.4 cm    Across Hand at [employer] 18.2 cm    At [place] of 2nd Digit 5.9 cm             L-DEX FLOWSHEETS - 01/15/21 1100       L-DEX LYMPHEDEMA SCREENING   Measurement Type Unilateral    L-DEX MEASUREMENT EXTREMITY Upper Extremity    POSITION  Standing    DOMINANT SIDE Right    At Risk Side Right    BASELINE SCORE (UNILATERAL) -4.5             The patient was assessed using the L-Dex machine today to produce a lymphedema index baseline score. The patient will be reassessed on a regular basis (typically every 3 months) to obtain new L-Dex scores. If the score is > 6.5 points away from his/her baseline score indicating onset of subclinical lymphedema, it will be recommended to wear a compression garment for 4 weeks, 12 hours per day and then be reassessed. If the score continues to be > 6.5 points from baseline at reassessment, we will initiate lymphedema treatment. Assessing in this manner has a 95% rate of preventing clinically significant lymphedema.      [name] - 01/15/21 0001     Open a tight or new jar No difficulty    Do heavy household chores (wash walls, wash floors) No difficulty    Carry a shopping bag or briefcase No difficulty    Wash your back No difficulty    Use a knife to cut food No difficulty  Recreational activities in which you take some force or impact through your arm, shoulder, or hand (golf,  hammering, tennis) No difficulty    During the past week, to what extent has your arm, shoulder or hand problem interfered with your normal social activities with family, friends, neighbors, or groups? Not at all    During the past week, to what extent has your arm, shoulder or hand problem limited your work or other regular daily activities Not at all    Arm, shoulder, or hand pain. None    Tingling (pins and needles) in your arm, shoulder, or hand None    Difficulty Sleeping No difficulty    DASH Score 0 %              Objective measurements completed on examination: See above findings.         Patient was instructed today in a home exercise program today for post op shoulder range of motion. These included active assist shoulder flexion in sitting, scapular retraction, wall walking with shoulder abduction, and hands behind head external rotation.  She was encouraged to do these twice a day, holding 3 seconds and repeating 5 times when permitted by her physician.         PT Education - 01/15/21 1104     Education Details Lymphedema risk reduction and post op HEP    Person(s) Educated Patient;Child(ren)    Methods Explanation;Demonstration;Handout    Comprehension Returned demonstration;Verbalized understanding                 PT Long Term Goals - 01/15/21 1111       PT LONG TERM GOAL #1   Title Patient will demonstrate she has regained full shoulder ROM and function post operatively compared to baselines.    Time 8    Period Weeks    Status New    Target Date 03/12/21             Breast Clinic Goals - 01/15/21 1110       Patient will be able to verbalize understanding of pertinent lymphedema risk reduction practices relevant to her diagnosis specifically related to skin care.   Time 1    Period Days    Status Achieved      Patient will be able to return demonstrate and/or verbalize understanding of the post-op home exercise program related to  regaining shoulder range of motion.   Time 1    Period Days    Status Achieved      Patient will be able to verbalize understanding of the importance of attending the postoperative After Breast Cancer Class for further lymphedema risk reduction education and therapeutic exercise.   Time 1    Period Days    Status Achieved                   Plan - 01/15/21 1108     Clinical Impression Statement Patient was diagnosed on 12/10/2020 with right grade II invasive ductal carcinoma breast cancer. It measures 1.5 cm and is located in the upper outer quadrant. It is ER/PR positive and HER2 negative with a Ki67 of 10%. Her multidisciplinary medical team met prior to her assessments to determine a recommended treatment plan. She is planning to have a bilateral mastectomy and right sentinel node biopsy followed chemotherapy, radiation, and anti-estrogen therapy. She will benefit from a post op PT reassessment to determine needs and from L-Dex screens every 3 months for 2 years to detect  subclinical lymphedema.    Stability/Clinical Decision Making Stable/Uncomplicated    Clinical Decision Making Low    Rehab Potential Excellent    PT Frequency --   eval and 1 f/u visit   PT Treatment/Interventions ADLs/Self Care Home Management;Therapeutic exercise;Patient/family education    PT Next Visit Plan Will reassess 3-4 weeks post op    PT Home Exercise Plan Post op HEP    Consulted and Agree with Plan of Care Patient;Family member/caregiver    Family Member Consulted daughter             Patient will benefit from skilled therapeutic intervention in order to improve the following deficits and impairments:  Postural dysfunction, Decreased range of motion, Decreased knowledge of precautions, Impaired UE functional use, Pain  Visit Diagnosis: Malignant neoplasm of upper-outer quadrant of right breast in female, estrogen receptor positive ([place]) - Plan: PT plan of care cert/re-cert  Abnormal  posture - Plan: PT plan of care cert/re-cert  Patient will follow up at outpatient cancer rehab 3-4 weeks following surgery.  If the patient requires physical therapy at that time, a specific plan will be dictated and sent to the referring physician for approval. The patient was educated today on appropriate basic range of motion exercises to begin post operatively and the importance of attending the After Breast Cancer class following surgery.  Patient was educated today on lymphedema risk reduction practices as it pertains to recommendations that will benefit the patient immediately following surgery.  She verbalized good understanding.      Problem List Patient Active Problem List   Diagnosis Date Noted   Malignant neoplasm of upper-outer quadrant of right breast in female, estrogen receptor positive ([place]) 01/13/2021   Pre-diabetes 11/06/2020   Acquired hypothyroidism 04/14/2018   Migraine 01/18/2015   Insomnia 11/05/2011   Anxiety 11/05/2011   Hyperlipidemia 11/05/2011   Tachycardia 73/71/0626   Umbilical hernia 94/85/4627   Annia Friendly, PT 01/15/21 11:14 AM   [facility] @ [place], [place], [address] Phone: [phone]   Fax:  [phone]  Name: Dana Kramer MRN: [id number] Date of Birth: [birth year]

## 2021-01-15 NOTE — Patient Instructions (Signed)

## 2021-01-15 NOTE — Progress Notes (Signed)
REFERRING PROVIDER: Truitt Merle, MD [address] [place], [place] [address]  PRIMARY PROVIDER:  Darreld Mclean, MD  PRIMARY REASON FOR VISIT:  1. Malignant neoplasm of upper-outer quadrant of right breast in female, estrogen receptor positive ([place])   2. Family history of colon cancer     HISTORY OF PRESENT ILLNESS:   Dana Kramer, a 55 y.o. female, was seen for a [place] cancer genetics consultation during the breast multidisciplinary clinic at the request of Dr. Burr Medico due to a personal and family history of cancer.  Dana Kramer presents to clinic today to discuss the possibility of a hereditary predisposition to cancer, to discuss genetic testing, and to further clarify her future cancer risks, as well as potential cancer risks for family members.   In November 2022, at the age of 40, Dana Kramer was diagnosed with invasive ductal carcinoma of the right breast.  CANCER HISTORY:  Oncology History Overview Note  Cancer Staging Malignant neoplasm of upper-outer quadrant of right breast in female, estrogen receptor positive ([place]) Staging form: Breast, AJCC 8th Edition - Clinical stage from 01/08/2021: Stage IA (cT1b, cN0, cM0, G2, ER+, PR+, HER2+) - Signed by Truitt Merle, MD on 01/15/2021    Malignant neoplasm of upper-outer quadrant of right breast in female, estrogen receptor positive ([place])  01/02/2021 Mammogram   EXAM: DIGITAL DIAGNOSTIC BILATERAL MAMMOGRAM WITH TOMOSYNTHESIS AND CAD; ULTRASOUND LEFT BREAST LIMITED; ULTRASOUND RIGHT BREAST LIMITED  IMPRESSION: 1. Highly suspicious 0.7 cm UPPER-OUTER RIGHT breast mass with associated suspicious calcifications extending 1.5 cm anteriorly. Tissue sampling is recommended. 2. No abnormal appearing RIGHT axillary lymph nodes. 3. Benign LOWER LEFT breast cyst corresponding to the LEFT breast screening study finding.   01/08/2021 Cancer Staging   Staging form: Breast, AJCC 8th Edition - Clinical stage from 01/08/2021: Stage IA (cT1b, cN0,  cM0, G2, ER+, PR+, HER2+) - Signed by Truitt Merle, MD on 01/15/2021 Stage prefix: Initial diagnosis Nuclear grade: G2 Histologic grading system: 3 grade system    01/08/2021 Pathology Results   Diagnosis Breast, right, needle core biopsy, upper outer quadrant, 11 o'clock, 5cmfn, ribbon clip - INVASIVE DUCTAL CARCINOMA - DUCTAL CARCINOMA IN SITU WITH CALCIFICATIONS - SEE COMMENT Microscopic Comment Based on the biopsy, the carcinoma appears Nottingham grade 2 of 3 and measures 0.6 cm in greatest linear extent.  PROGNOSTIC INDICATORS Results: The tumor cells are 2+ for Her2 (EQUIVOCAL). Her2 by FISH will be performed and results reported separately. Estrogen Receptor: 100%, POSITIVE, STRONG STAINING INTENSITY Progesterone Receptor: 70%, POSITIVE, STRONG STAINING INTENSITY Proliferation Marker Ki67: 10%  FLUORESCENCE IN-SITU HYBRIDIZATION Results: GROUP 1: HER2 **POSITIVE**   01/13/2021 Initial Diagnosis   Malignant neoplasm of upper-outer quadrant of right breast in female, estrogen receptor positive ([place])     RISK FACTORS:  Menarche was at age 74.  First live birth at age 22.  OCP use for approximately 0 years.  Ovaries intact: yes.  Uterus intact: yes. HRT use: 0 years. Colonoscopy: no Mammogram within the last year: yes.  Past Medical History:  Diagnosis Date   Allergy 11/1998   Penicillin   Chicken pox    History of transfusion of whole blood    Hyperlipidemia    Malignant neoplasm of upper-outer quadrant of right breast in female, estrogen receptor positive ([place]) 01/13/2021   Migraine    Thyroid disease    Hypothyroidism    Past Surgical History:  Procedure Laterality Date   ABDOMINOPLASTY  10/01/2010   CESAREAN SECTION     twice  HERNIA REPAIR     TUBAL LIGATION     umblica hernia repair  63/14/9702    Social History   Socioeconomic History   Marital status: Married    Spouse name: Not on file   Number of children: 2   Years of education: Not on  file   Highest education level: Not on file  Occupational History   Not on file  Tobacco Use   Smoking status: Never   Smokeless tobacco: Never  Substance and Sexual Activity   Alcohol use: Never   Drug use: Never   Sexual activity: Not Currently  Other Topics Concern   Not on file  Social History Narrative   Not on file   Social Determinants of Health   Financial Resource Strain: Not on file  Food Insecurity: Not on file  Transportation Needs: Not on file  Physical Activity: Not on file  Stress: Not on file  Social Connections: Not on file     FAMILY HISTORY:  We obtained a detailed, 4-generation family history.  Significant diagnoses are listed below: Family History  Problem Relation Age of Onset   Cancer Father 20       esophageal cancer   Leukemia Maternal Grandfather        dx. >50   Colon cancer Paternal Grandfather        dx. >50   Lung cancer Cousin        dx. 51s, did not smoke     Dana Kramer maternal grandfather was diagnosed with leukemia and is deceased. Her father was diagnosed with esophageal cancer at age 59 and died due to cancer metastasis 6 months later. A paternal cousin was diagnosed with lung cancer in his 69s and he did not smoke, he is deceased. Her paternal grandfather was diagnosed with colon cancer at an unknown age (>50) and died due to colon cancer metastasis.  Dana Kramer is unaware of previous family history of genetic testing for hereditary cancer risks. There is no reported Ashkenazi Jewish ancestry.   GENETIC COUNSELING ASSESSMENT: Dana Kramer is a 55 y.o. female with a personal and family history of cancer which is somewhat suggestive of a hereditary cancer syndrome. Therefore, we discussed and recommended the following at today's visit.   DISCUSSION: We discussed that 5 - 10% of cancer is hereditary, with most cases of hereditary breast cancer associated with mutations in BRCA1/2.  There are other genes that can be associated with hereditary  breast cancer syndromes. Type of cancer risk and level of risk are gene-specific. We discussed that testing is beneficial for several reasons including knowing how to follow individuals after completing their treatment, identifying whether potential treatment options would be beneficial, and understanding if other family members could be at risk for cancer and allowing them to undergo genetic testing.   We reviewed the characteristics, features and inheritance patterns of hereditary cancer syndromes. We also discussed genetic testing, including the appropriate family members to test, the process of testing, insurance coverage and turn-around-time for results. We discussed the implications of a negative, positive and/or variant of uncertain significant result. In order to get genetic test results in a timely manner so that Dana Kramer can use these genetic test results for surgical decisions, we recommended Dana Kramer pursue genetic testing for the BRCAplus Panel. Once complete, we recommend Dana Kramer pursue reflex genetic testing to a more comprehensive gene panel.   Dana Kramer  was offered a common hereditary cancer panel (47 genes) and an  expanded pan-cancer panel (77 genes). Dana Kramer was informed of the benefits and limitations of each panel, including that expanded pan-cancer panels contain genes that do not have clear management guidelines at this point in time.  We also discussed that as the number of genes included on a panel increases, the chances of variants of uncertain significance increases.  After considering the benefits and limitations of each gene panel, Dana Kramer elected to have [facility] CustomNext Panel+RNA (47 genes).  The CustomNext-Cancer+RNAinsight panel offered by [facility] includes sequencing and rearrangement analysis for the following 47 genes:  APC, ATM, AXIN2, BARD1, BMPR1A, BRCA1, BRCA2, BRIP1, CDH1, CDK4, CDKN2A, CHEK2, DICER1, EPCAM, GREM1, HOXB13, MEN1, MLH1, MSH2, MSH3, MSH6, MUTYH, NBN,  NF1, NF2, NTHL1, PALB2, PMS2, POLD1, POLE, PTEN, RAD51C, RAD51D, RECQL, RET, SDHA, SDHAF2, SDHB, SDHC, SDHD, SMAD4, SMARCA4, STK11, TP53, TSC1, TSC2, and VHL.  RNA data is routinely analyzed for use in variant interpretation for all genes.   Based on Dana Kramer personal and family history of cancer, she does not meet medical criteria for genetic testing. Therefore, she may have an out of pocket cost. We discussed that if her out of pocket cost for testing is over $100, the laboratory should contact them to discuss self-pay prices, patient pay assistance programs, if applicable, and other billing options.   PLAN: After considering the risks, benefits, and limitations, Dana Kramer provided informed consent to pursue genetic testing and the blood sample was sent to [facility] for analysis of the CustomNext Panel+RNA (47 genes). Results should be available within approximately 1-2 weeks' time, at which point they will be disclosed by telephone to Dana Kramer, as will any additional recommendations warranted by these results. Dana Kramer will receive a summary of her genetic counseling visit and a copy of her results once available. This information will also be available in Epic.   Dana Kramer questions were answered to her satisfaction today. Our contact information was provided should additional questions or concerns arise. Thank you for the referral and allowing [place] to share in the care of your patient.   [name], MS, [facility] Genetic Counselor [email or web address] (P) [phone]  The patient was seen for a total of 20 minutes in face-to-face genetic counseling.  The patient brought her daughter, Royston Bake. Drs. Magrinat, Lindi Adie and/or Burr Medico were available to discuss this case as needed.  _______________________________________________________________________ For Office Staff:  Number of people involved in session: 2 Was an Intern/ student involved with case: no

## 2021-01-15 NOTE — Progress Notes (Addendum)
Dana Kramer   Telephone:([phone]) [phone] Fax:([phone]) [place] Note   Patient Care Team: Copland, Gay Filler, MD as PCP - General (Family Medicine) Erroll Luna, MD as Consulting Physician (General Surgery) Truitt Merle, MD as Consulting Physician (Hematology) Eppie Gibson, MD as Attending Physician (Radiation Oncology) Mauro Kaufmann, RN as Oncology Nurse Navigator [name], RN as Oncology Nurse Navigator  Date of Service:  01/15/2021   CHIEF COMPLAINTS/PURPOSE OF CONSULTATION:  Right Breast Cancer, triple positive  REFERRING PHYSICIAN:  [facility]   ASSESSMENT & PLAN:  Dana Kramer is a 55 y.o. perimenopausal female with  1. Malignant neoplasm of upper-outer quadrant of right breast, Stage IA, c(T1bN0M0), Triple positive, Grade 2 -found on screening mammogram. B/L diagnostic MM and [place] 01/02/21 showed 0.7 cm upper-outer right breast mass with associated calcifications extending 1.5 cm. No abnormal right lymph nodes or left malignancy. 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 --We discussed her imaging findings and the biopsy results in great details. -Given her young age and anxiety surrounding mammograms, she would like to proceed with bilateral mastectomies. She was seen by Dr. Brantley Stage today and likely will proceed with surgery soon.  -In light of her Her2+ disease, I recommend adjuvant chemotherapy with weekly Taxol for 12 weeks and trastuzumab for a year.  However if she has more advanced disease on surgical pathology, especially positive lymph nodes, I may recommend more intensive adjuvant chemotherapy.  I reviewed the benefit and the potential side effects with her, she is interested. -Giving the strong ER and PR expression in her perimenopausal status, I recommend adjuvant endocrine therapy with Tamoxifen for 10 years to reduce the risk of cancer recurrence. Potential benefits and side effects were discussed with patient and she  is interested. -we also discussed ovarian suppression and aromatase inhibitor.  I recommended her to consider due to her young age and HER2 positive disease.  She is interested.  -She was also seen by radiation oncologist Dr. Isidore Moos today. If her surgical sentinel lymph nodes are negative, she would not need post mastectomy radiation.  -We also discussed the breast cancer surveillance after her surgery. She will continue annual screening mammogram, self exam, and a routine office visit with lab and exam with [place]. -I encouraged her to have healthy diet and exercise regularly.     PLAN:  -she will proceed with b/l mastectomy with R SLN biopsy with Dr. Brantley Stage soon -she will return in 3 weeks after surgery to review chemotherapy plan -echo in next month    Oncology History Overview Note  Cancer Staging Malignant neoplasm of upper-outer quadrant of right breast in female, estrogen receptor positive ([place]) Staging form: Breast, AJCC 8th Edition - Clinical stage from 01/08/2021: Stage IA (cT1b, cN0, cM0, G2, ER+, PR+, HER2+) - Signed by Truitt Merle, MD on 01/15/2021    Malignant neoplasm of upper-outer quadrant of right breast in female, estrogen receptor positive ([place])  01/02/2021 Mammogram   EXAM: DIGITAL DIAGNOSTIC BILATERAL MAMMOGRAM WITH TOMOSYNTHESIS AND CAD; ULTRASOUND LEFT BREAST LIMITED; ULTRASOUND RIGHT BREAST LIMITED  IMPRESSION: 1. Highly suspicious 0.7 cm UPPER-OUTER RIGHT breast mass with associated suspicious calcifications extending 1.5 cm anteriorly. Tissue sampling is recommended. 2. No abnormal appearing RIGHT axillary lymph nodes. 3. Benign LOWER LEFT breast cyst corresponding to the LEFT breast screening study finding.   01/08/2021 Cancer Staging   Staging form: Breast, AJCC 8th Edition - Clinical stage from 01/08/2021: Stage IA (cT1b, cN0, cM0, G2, ER+, PR+,  HER2+) - Signed by Truitt Merle, MD on 01/15/2021 Stage prefix: Initial diagnosis Nuclear grade: G2 Histologic grading  system: 3 grade system    01/08/2021 Pathology Results   Diagnosis Breast, right, needle core biopsy, upper outer quadrant, 11 o'clock, 5cmfn, ribbon clip - INVASIVE DUCTAL CARCINOMA - DUCTAL CARCINOMA IN SITU WITH CALCIFICATIONS - SEE COMMENT Microscopic Comment Based on the biopsy, the carcinoma appears Nottingham grade 2 of 3 and measures 0.6 cm in greatest linear extent.  PROGNOSTIC INDICATORS Results: The tumor cells are 2+ for Her2 (EQUIVOCAL). Her2 by FISH will be performed and results reported separately. Estrogen Receptor: 100%, POSITIVE, STRONG STAINING INTENSITY Progesterone Receptor: 70%, POSITIVE, STRONG STAINING INTENSITY Proliferation Marker Ki67: 10%  FLUORESCENCE IN-SITU HYBRIDIZATION Results: GROUP 1: HER2 **POSITIVE**   01/13/2021 Initial Diagnosis   Malignant neoplasm of upper-outer quadrant of right breast in female, estrogen receptor positive ([place])      HISTORY OF PRESENTING ILLNESS:  Dana Kramer 55 y.o. female is a here because of breast cancer. The patient was [facility] by [facility]. The patient presents to the clinic today accompanied by her daughter.   She had routine screening mammography on 12/10/20 showing a possible abnormality in the bilateral breasts. She underwent bilateral diagnostic mammography and bilateral breast ultrasonography on 01/02/21 showing: 0.7 cm upper-outer right breast mass with associated calcifications extending 1.5 cm anteriorly. Right axilla and left breast were benign.  Biopsy on 01/08/21 showed: invasive ductal carcinoma, grade 2; DCIS with calcifications. Prognostic indicators significant for: estrogen receptor, 100% positive and progesterone receptor, 70% positive. Proliferation marker Ki67 at 10%. HER2 positive by FISH.   Today the patient notes they felt/feeling prior/after... -she denies any pain or current issues.   She has a PMHx of.... -hypothyroidism, on synthroid -reports she is on metoprolol to control her  heart rate.   Socially... -she is married with two children. 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 -father with esophageal cancer around age 10. She also notes colon cancer and leukemia in her extended family. She denies breast, pancreatic, or ovarian cancers.   GYN HISTORY  Menarchal: 55 years old LMP: 11/2020, previously regular Contraceptive: s/p tubal ligation HRT: n/a GP: 2, first at age 50   REVIEW OF SYSTEMS:    Constitutional: Denies fevers, chills or abnormal night sweats Eyes: Denies blurriness of vision, double vision or watery eyes Ears, nose, mouth, throat, and face: Denies mucositis or sore throat Respiratory: Denies cough, dyspnea or wheezes Cardiovascular: Denies palpitation, chest discomfort or lower extremity swelling Gastrointestinal:  Denies nausea, heartburn or change in bowel habits Skin: Denies abnormal skin rashes Lymphatics: Denies new lymphadenopathy or easy bruising Neurological:Denies numbness, tingling or new weaknesses Behavioral/Psych: Mood is stable, no new changes  All other systems were reviewed with the patient and are negative.   MEDICAL HISTORY:  Past Medical History:  Diagnosis Date   Allergy 11/1998   Penicillin   Chicken pox    History of transfusion of whole blood    Hyperlipidemia    Malignant neoplasm of upper-outer quadrant of right breast in female, estrogen receptor positive ([place]) 01/13/2021   Migraine    Thyroid disease    Hypothyroidism    SURGICAL HISTORY: Past Surgical History:  Procedure Laterality Date   ABDOMINOPLASTY  10/01/2010   CESAREAN SECTION     twice   HERNIA REPAIR     TUBAL LIGATION     umblica hernia repair  17/40/8144    SOCIAL HISTORY: Social History   Socioeconomic  History   Marital status: Married    Spouse name: Not on file   Number of children: 2   Years of education: Not on file   Highest education level: Not on file  Occupational History   Not on file  Tobacco Use   Smoking  status: Never   Smokeless tobacco: Never  Substance and Sexual Activity   Alcohol use: Never   Drug use: Never   Sexual activity: Not Currently  Other Topics Concern   Not on file  Social History Narrative   Not on file   Social Determinants of Health   Financial Resource Strain: Not on file  Food Insecurity: Not on file  Transportation Needs: Not on file  Physical Activity: Not on file  Stress: Not on file  Social Connections: Not on file  Intimate Partner Violence: Not on file    FAMILY HISTORY: Family History  Problem Relation Age of Onset   Hypertension Mother    Cancer Father 32       esophageal cancer   Leukemia Maternal Grandfather        dx. >50   Colon cancer Paternal Grandfather        dx. >50   Lung cancer Cousin        dx. 85s, did not smoke    ALLERGIES:  is allergic to penicillins.  MEDICATIONS:  Current Outpatient Medications  Medication Sig Dispense Refill   Calcium 200 MG TABS Take 1 tablet by mouth daily.     Cholecalciferol (VITAMIN D PO) Take 1 tablet by mouth daily.     levothyroxine (SYNTHROID) 25 MCG tablet TAKE 1 TABLET(25 MCG) BY MOUTH DAILY BEFORE BREAKFAST 90 tablet 3   metoprolol succinate (TOPROL-XL) 25 MG 24 hr tablet Take 1 tablet (25 mg total) by mouth daily. Take with or immediately following a meal. 90 tablet 3   Multiple Vitamins-Minerals (WOMENS MULTIVITAMIN PO) Take 1 tablet by mouth daily.     pantoprazole (PROTONIX) 20 MG tablet Take 20 mg by mouth daily.     sertraline (ZOLOFT) 50 MG tablet TAKE 1 TABLET(50 MG) BY MOUTH DAILY 90 tablet 3   simvastatin (ZOCOR) 20 MG tablet TAKE 1 TABLET(20 MG) BY MOUTH AT BEDTIME 90 tablet 3   No current facility-administered medications for this visit.    PHYSICAL EXAMINATION: ECOG PERFORMANCE STATUS: 0 - Asymptomatic  Vitals:   01/15/21 0824  BP: (!) 170/99  Pulse: 85  Resp: 18  Temp: 97.7 F (36.5 C)  SpO2: 98%   Filed Weights   01/15/21 0824  Weight: 128 lb 4.8 oz (58.2 kg)     GENERAL:alert, no distress and comfortable SKIN: skin color, texture, turgor are normal, no rashes or significant lesions EYES: normal, Conjunctiva are pink and non-injected, sclera clear  NECK: supple, thyroid normal size, non-tender, without nodularity LYMPH:  no palpable lymphadenopathy in the cervical, axillary  LUNGS: clear to auscultation and percussion with normal breathing effort HEART: regular rate & rhythm and no murmurs and no lower extremity edema ABDOMEN:abdomen soft, non-tender and normal bowel sounds Musculoskeletal:no cyanosis of digits and no clubbing  NEURO: alert & oriented x 3 with fluent speech, no focal motor/sensory deficits BREAST: mild fullness without mass, likely post-biopsy bleeding. No palpable mass, nodules or adenopathy bilaterally.   LABORATORY DATA:  I have reviewed the data as listed CBC Latest Ref Rng & Units 01/15/2021 11/06/2020 11/02/2019  WBC 4.0 - 10.5 K/uL 5.8 5.7 5.7  Hemoglobin 12.0 - 15.0 g/dL 11.8(L) 12.4 13.5  Hematocrit 36.0 - 46.0 % 36.4 36.8 41.1  Platelets 150 - 400 K/uL 194 201.0 218    CMP Latest Ref Rng & Units 01/15/2021 11/06/2020 11/02/2019  Glucose 70 - 99 mg/dL 105(H) 77 91  BUN 6 - 20 mg/dL 11 7 13   Creatinine 0.44 - 1.00 mg/dL 0.83 0.77 0.83  Sodium 135 - 145 mmol/L 142 138 139  Potassium 3.5 - 5.1 mmol/L 3.5 4.4 4.5  Chloride 98 - 111 mmol/L 108 102 102  CO2 22 - 32 mmol/L 24 30 25   Calcium 8.9 - 10.3 mg/dL 9.2 9.7 9.4  Total Protein 6.5 - 8.1 g/dL 7.3 6.9 6.9  Total Bilirubin 0.3 - 1.2 mg/dL 0.3 0.7 0.7  Alkaline Phos 38 - 126 U/L 59 54 -  AST 15 - 41 U/L 19 18 19   ALT 0 - 44 U/L 9 9 8      RADIOGRAPHIC STUDIES: I have personally reviewed the radiological images as listed and agreed with the findings in the report. US BREAST LTD UNI LEFT INC AXILLA  Result Date: 01/02/2021 CLINICAL DATA:  55 year old female for further evaluation of possible RIGHT breast mass with adjacent calcifications and possible LEFT breast  mass. EXAM: DIGITAL DIAGNOSTIC BILATERAL MAMMOGRAM WITH TOMOSYNTHESIS AND CAD; ULTRASOUND LEFT BREAST LIMITED; ULTRASOUND RIGHT BREAST LIMITED TECHNIQUE: Bilateral digital diagnostic mammography and breast tomosynthesis was performed. The images were evaluated with computer-aided detection.; Targeted ultrasound examination of the left breast was performed.; Targeted ultrasound examination of the right breast was performed COMPARISON:  Previous exam(s). ACR Breast Density Category c: The breast tissue is heterogeneously dense, which may obscure small masses. FINDINGS: Spot compression views of both breasts and full field and magnification views of the RIGHT breast are performed. RIGHT breast: A persistent irregular mass within the UPPER-OUTER RIGHT breast, middle depth, with suspicious calcifications extending 1.5 cm anterior from the mass. LEFT breast: A persistent circumscribed oval mass within the posterior LOWER LEFT breast is identified. Targeted ultrasound is performed, showing the following: RIGHT: A 0.7 x 0.7 x 0.7 cm irregular hypoechoic mass at the 11 o'clock position of the RIGHT breast 5 cm from the nipple. No abnormal RIGHT axillary lymph nodes are noted. LEFT: A 1.1 x 0.7 x 0.9 cm benign simple cyst at the 6 o'clock position of the LEFT breast 3 cm from the nipple, corresponding the mammographic finding. IMPRESSION: 1. Highly suspicious 0.7 cm UPPER-OUTER RIGHT breast mass with associated suspicious calcifications extending 1.5 cm anteriorly. Tissue sampling is recommended. 2. No abnormal appearing RIGHT axillary lymph nodes. 3. Benign LOWER LEFT breast cyst corresponding to the LEFT breast screening study finding. RECOMMENDATION: 1. Ultrasound-guided biopsy of the UPPER-OUTER RIGHT breast mass. Associated calcifications extending anteriorly are considered the a part of the same process and can be bracketed if surgical excision is necessary. I have discussed the findings and recommendations with the  patient. If applicable, a reminder letter will be sent to the patient regarding the next appointment. BI-RADS CATEGORY  5: Highly suggestive of malignancy. Electronically Signed   By: Margarette Canada M.D.   On: 01/02/2021 10:54  US BREAST LTD UNI RIGHT INC AXILLA  Result Date: 01/02/2021 CLINICAL DATA:  55 year old female for further evaluation of possible RIGHT breast mass with adjacent calcifications and possible LEFT breast mass. EXAM: DIGITAL DIAGNOSTIC BILATERAL MAMMOGRAM WITH TOMOSYNTHESIS AND CAD; ULTRASOUND LEFT BREAST LIMITED; ULTRASOUND RIGHT BREAST LIMITED TECHNIQUE: Bilateral digital diagnostic mammography and breast tomosynthesis was performed. The images were evaluated with computer-aided detection.; Targeted ultrasound examination of the left  breast was performed.; Targeted ultrasound examination of the right breast was performed COMPARISON:  Previous exam(s). ACR Breast Density Category c: The breast tissue is heterogeneously dense, which may obscure small masses. FINDINGS: Spot compression views of both breasts and full field and magnification views of the RIGHT breast are performed. RIGHT breast: A persistent irregular mass within the UPPER-OUTER RIGHT breast, middle depth, with suspicious calcifications extending 1.5 cm anterior from the mass. LEFT breast: A persistent circumscribed oval mass within the posterior LOWER LEFT breast is identified. Targeted ultrasound is performed, showing the following: RIGHT: A 0.7 x 0.7 x 0.7 cm irregular hypoechoic mass at the 11 o'clock position of the RIGHT breast 5 cm from the nipple. No abnormal RIGHT axillary lymph nodes are noted. LEFT: A 1.1 x 0.7 x 0.9 cm benign simple cyst at the 6 o'clock position of the LEFT breast 3 cm from the nipple, corresponding the mammographic finding. IMPRESSION: 1. Highly suspicious 0.7 cm UPPER-OUTER RIGHT breast mass with associated suspicious calcifications extending 1.5 cm anteriorly. Tissue sampling is recommended. 2. No  abnormal appearing RIGHT axillary lymph nodes. 3. Benign LOWER LEFT breast cyst corresponding to the LEFT breast screening study finding. RECOMMENDATION: 1. Ultrasound-guided biopsy of the UPPER-OUTER RIGHT breast mass. Associated calcifications extending anteriorly are considered the a part of the same process and can be bracketed if surgical excision is necessary. I have discussed the findings and recommendations with the patient. If applicable, a reminder letter will be sent to the patient regarding the next appointment. BI-RADS CATEGORY  5: Highly suggestive of malignancy. Electronically Signed   By: Margarette Canada M.D.   On: 01/02/2021 10:54  MM DIAG BREAST TOMO BILATERAL  Result Date: 01/02/2021 CLINICAL DATA:  55 year old female for further evaluation of possible RIGHT breast mass with adjacent calcifications and possible LEFT breast mass. EXAM: DIGITAL DIAGNOSTIC BILATERAL MAMMOGRAM WITH TOMOSYNTHESIS AND CAD; ULTRASOUND LEFT BREAST LIMITED; ULTRASOUND RIGHT BREAST LIMITED TECHNIQUE: Bilateral digital diagnostic mammography and breast tomosynthesis was performed. The images were evaluated with computer-aided detection.; Targeted ultrasound examination of the left breast was performed.; Targeted ultrasound examination of the right breast was performed COMPARISON:  Previous exam(s). ACR Breast Density Category c: The breast tissue is heterogeneously dense, which may obscure small masses. FINDINGS: Spot compression views of both breasts and full field and magnification views of the RIGHT breast are performed. RIGHT breast: A persistent irregular mass within the UPPER-OUTER RIGHT breast, middle depth, with suspicious calcifications extending 1.5 cm anterior from the mass. LEFT breast: A persistent circumscribed oval mass within the posterior LOWER LEFT breast is identified. Targeted ultrasound is performed, showing the following: RIGHT: A 0.7 x 0.7 x 0.7 cm irregular hypoechoic mass at the 11 o'clock position  of the RIGHT breast 5 cm from the nipple. No abnormal RIGHT axillary lymph nodes are noted. LEFT: A 1.1 x 0.7 x 0.9 cm benign simple cyst at the 6 o'clock position of the LEFT breast 3 cm from the nipple, corresponding the mammographic finding. IMPRESSION: 1. Highly suspicious 0.7 cm UPPER-OUTER RIGHT breast mass with associated suspicious calcifications extending 1.5 cm anteriorly. Tissue sampling is recommended. 2. No abnormal appearing RIGHT axillary lymph nodes. 3. Benign LOWER LEFT breast cyst corresponding to the LEFT breast screening study finding. RECOMMENDATION: 1. Ultrasound-guided biopsy of the UPPER-OUTER RIGHT breast mass. Associated calcifications extending anteriorly are considered the a part of the same process and can be bracketed if surgical excision is necessary. I have discussed the findings and recommendations with the patient. If applicable,  a reminder letter will be sent to the patient regarding the next appointment. BI-RADS CATEGORY  5: Highly suggestive of malignancy. Electronically Signed   By: Margarette Canada M.D.   On: 01/02/2021 10:54  MM CLIP PLACEMENT RIGHT  Result Date: 01/08/2021 CLINICAL DATA:  Confirmation of clip placement after ultrasound-guided core needle biopsy of a highly suspicious 7 mm mass in the UPPER OUTER QUADRANT of the RIGHT breast. EXAM: 2D and 3D DIAGNOSTIC RIGHT MAMMOGRAM POST ULTRASOUND BIOPSY COMPARISON:  Previous exam(s). FINDINGS: Tomosynthesis and synthesized full field CC and mediolateral images were obtained following ultrasound guided biopsy of a mass involving the UPPER OUTER QUADRANT of the RIGHT breast. The ribbon shaped tissue marking clip is appropriately positioned within the biopsied mass in the [place]. Suspicious calcifications extend approximately 1.6 cm anterior to the ribbon clip. Expected post biopsy changes are present without evidence of hematoma. IMPRESSION: Appropriate positioning of the ribbon shaped tissue marking clip within  the biopsied mass in the [place] of the RIGHT breast. Suspicious calcifications extend approximately 1.6 cm anterior to the ribbon clip. Final Assessment: Post Procedure Mammograms for Marker Placement Electronically Signed   By: Evangeline Dakin M.D.   On: 01/08/2021 09:02  [place] RT BREAST BX W LOC DEV 1ST LESION IMG BX SPEC US GUIDE  Addendum Date: 01/10/2021   ADDENDUM REPORT: 01/10/2021 12:26 ADDENDUM: Pathology revealed GRADE II INVASIVE DUCTAL CARCINOMA, DUCTAL CARCINOMA IN SITU WITH CALCIFICATIONS of the RIGHT breast, upper outer quadrant, 11 o'clock, 5cmfn, (ribbon clip). This was found to be concordant by Dr. Peggye Fothergill. Pathology results were discussed with the patient by telephone. The patient reported doing well after the biopsy with tenderness at the site. Post biopsy instructions and care were reviewed and questions were answered. The patient was encouraged to call [facility] for any additional concerns. My direct phone number was provided. The patient was referred to [facility] on January 15, 2021. NOTE: Associated calcifications extending anteriorly are considered the a part of the same process and can be bracketed if breast conservation is a consideration. Pathology results reported by Terie Purser, RN on 01/10/2021. Electronically Signed   By: Evangeline Dakin M.D.   On: 01/10/2021 12:26   Result Date: 01/10/2021 CLINICAL DATA:  55 year old with a screening detected highly suspicious 7 mm mass associated with architectural distortion and suspicious calcifications in the UPPER OUTER QUADRANT of the RIGHT breast at the 11 o'clock position 5 cm from nipple at middle to posterior depth. EXAM: ULTRASOUND GUIDED RIGHT BREAST CORE NEEDLE BIOPSY COMPARISON:  Previous exam(s). PROCEDURE: I met with the patient and we discussed the procedure of ultrasound-guided biopsy, including  benefits and alternatives. We discussed the high likelihood of a successful procedure. We discussed the risks of the procedure, including infection, bleeding, tissue injury, clip migration, and inadequate sampling. Informed written consent was given. The usual time-out protocol was performed immediately prior to the procedure. Lesion quadrant: UPPER OUTER QUADRANT. Using sterile technique with chlorhexidine as skin antisepsis, 1% Lidocaine as local anesthetic, under direct ultrasound visualization, a 12 gauge Bard Marquee core needle device placed through an 11 gauge introducer needle was used to perform biopsy of the mass in the UPPER OUTER QUADRANT at 11 o'clock 5 cm from the nipple using a lateral approach. At the conclusion of the procedure a ribbon shaped tissue marker clip was deployed into the biopsy cavity. Follow up 2 view mammogram  was performed and dictated separately. IMPRESSION: Ultrasound guided biopsy of a highly suspicious 7 mm mass involving the UPPER OUTER QUADRANT of the RIGHT breast. No apparent complications. Electronically Signed: By: Evangeline Dakin M.D. On: 01/08/2021 09:03    No orders of the defined types were placed in this encounter.   All questions were answered. The patient knows to call the clinic with any problems, questions or concerns. The total time spent in the appointment was 60 minutes.     Truitt Merle, MD 01/15/2021   I, Wilburn Mylar, am acting as scribe for Truitt Merle, MD.   I have reviewed the above documentation for accuracy and completeness, and I agree with the above.

## 2021-01-16 ENCOUNTER — Encounter: Payer: Self-pay | Admitting: Family Medicine

## 2021-01-17 ENCOUNTER — Telehealth: Payer: Self-pay | Admitting: Hematology

## 2021-01-17 ENCOUNTER — Encounter: Payer: Self-pay | Admitting: Hematology

## 2021-01-17 NOTE — Telephone Encounter (Signed)
Scheduled per sch msg. Called and left msg  

## 2021-01-20 ENCOUNTER — Telehealth: Payer: Self-pay | Admitting: Hematology

## 2021-01-20 NOTE — Telephone Encounter (Signed)
Spoke to patient to confirm upcoming echo appointment for tomorrow at [facility]

## 2021-01-21 ENCOUNTER — Encounter: Payer: Self-pay | Admitting: *Deleted

## 2021-01-21 ENCOUNTER — Telehealth: Payer: Self-pay | Admitting: *Deleted

## 2021-01-21 ENCOUNTER — Ambulatory Visit (HOSPITAL_COMMUNITY)
Admission: RE | Admit: 2021-01-21 | Discharge: 2021-01-21 | Disposition: A | Discharge status: Home / Self Care | Payer: Managed Care, Other (non HMO) | Source: Ambulatory Visit | Attending: Hematology | Admitting: Hematology

## 2021-01-21 ENCOUNTER — Other Ambulatory Visit: Payer: Self-pay

## 2021-01-21 DIAGNOSIS — Z170 Estrogen receptor positive status [ER+]: Secondary | ICD-10-CM | POA: Insufficient documentation

## 2021-01-21 DIAGNOSIS — Z01818 Encounter for other preprocedural examination: Secondary | ICD-10-CM | POA: Diagnosis present

## 2021-01-21 DIAGNOSIS — C50411 Malignant neoplasm of upper-outer quadrant of right female breast: Secondary | ICD-10-CM | POA: Insufficient documentation

## 2021-01-21 NOTE — Telephone Encounter (Signed)
Received call back from patient. She needs a letter for the embassy for her sister to come here from [place] for 2 months to help her after sx and the start of chemo.  She states her husband is out of town a lot on business trips. I will inform Dr. Burr Medico so she can send a letter regarding this.

## 2021-01-21 NOTE — Progress Notes (Signed)
  Echocardiogram 2D Echocardiogram has been performed.  Dana Kramer 01/21/2021, 9:46 AM

## 2021-01-21 NOTE — Telephone Encounter (Signed)
Left message for a return phone call to follow up from [facility] 11/9 and assess navigation needs.

## 2021-01-21 NOTE — Progress Notes (Addendum)
Surgical Instructions    Your procedure is scheduled on Thursday November 17th.  Report to [facility] Main Entrance "A" at 12:45 P.M., then check in with the Admitting office.  Call this number if you have problems the morning of surgery:  [phone]   If you have any questions prior to your surgery date call [phone]: Open Monday-Friday 8am-4pm    Remember:  Do not eat after midnight the night before your surgery  You may drink clear liquids until 11:45 the morning of your surgery.   Clear liquids allowed are: Water, Non-Citrus Juices (without pulp), Carbonated Beverages, Clear Tea, Black Coffee ONLY (NO MILK, CREAM OR POWDERED CREAMER of any kind), and Gatorade  Enhanced Recovery after Surgery  Enhanced Recovery after Surgery is a protocol used to improve the stress on your body and your recovery after surgery.  Patient Instructions  The day of surgery (if you do NOT have diabetes):  Drink ONE (1) Pre-Surgery Clear Ensure by __10:45___ am the morning of surgery   This drink was given to you during your hospital  pre-op appointment visit. Nothing else to drink after completing the  Pre-Surgery Clear Ensure.          If you have questions, please contact your surgeon's office.    Take these medicines the morning of surgery with A SIP OF WATER levothyroxine (SYNTHROID) 25 MCG tablet metoprolol succinate (TOPROL-XL) 25 MG 24 hr tablet omeprazole (PRILOSEC OTC) 20 MG tablet   As of today, STOP taking any Aspirin (unless otherwise instructed by your surgeon) Aleve, Naproxen, Ibuprofen, Motrin, Advil, Goody's, BC's, all herbal medications, fish oil, and all vitamins.   After your COVID test   You are not required to quarantine however you are required to wear a well-fitting mask when you are out and around people not in your household.  If your mask becomes wet or soiled, replace with a new one.  Wash your hands often with soap and water for 20 seconds or clean your  hands with an alcohol-based hand sanitizer that contains at least 60% alcohol.  Do not share personal items.  Notify your provider: if you are in close contact with someone who has COVID  or if you develop a fever of 100.4 or greater, sneezing, cough, sore throat, shortness of breath or body aches.             Do not wear jewelry or makeup Do not wear lotions, powders, perfumes, or deodorant. Do not shave 48 hours prior to surgery.  . Do not bring valuables to the hospital. DO Not wear nail polish, gel polish, artificial nails, or any other type of covering on natural nails including finger and toenails. If patients have artificial nails, gel coating, etc. that need to be removed by a nail salon, please have this removed prior to surgery or surgery may need to be canceled/delayed if the surgeon/ anesthesia feels like the patient is unable to be adequately monitored.             [facility] is not responsible for any belongings or valuables.  Do NOT Smoke (Tobacco/Vaping)  24 hours prior to your procedure  If you use a CPAP at night, you may bring your mask for your overnight stay.   Contacts, glasses, hearing aids, dentures or partials may not be worn into surgery, please bring cases for these belongings   For patients admitted to the hospital, discharge time will be determined by your treatment team.   Patients  discharged the day of surgery will not be allowed to drive home, and someone needs to stay with them for 24 hours.  NO VISITORS WILL BE ALLOWED IN PRE-OP WHERE PATIENTS ARE PREPPED FOR SURGERY.  ONLY 1 SUPPORT PERSON MAY BE PRESENT IN THE WAITING ROOM WHILE YOU ARE IN SURGERY.  IF YOU ARE TO BE ADMITTED, ONCE YOU ARE IN YOUR ROOM YOU WILL BE ALLOWED TWO (2) VISITORS. 1 (ONE) VISITOR MAY STAY OVERNIGHT BUT MUST ARRIVE TO THE ROOM BY 8pm.  Minor children may have two parents present. Special consideration for safety and communication needs will be reviewed on a case by case  basis.  Special instructions:    Oral Hygiene is also important to reduce your risk of infection.  Remember - BRUSH YOUR TEETH THE MORNING OF SURGERY WITH YOUR REGULAR TOOTHPASTE   [facility]- Preparing For Surgery  Before surgery, you can play an important role. Because skin is not sterile, your skin needs to be as free of germs as possible. You can reduce the number of germs on your skin by washing with CHG (chlorahexidine gluconate) Soap before surgery.  CHG is an antiseptic cleaner which kills germs and bonds with the skin to continue killing germs even after washing.     Please do not use if you have an allergy to CHG or antibacterial soaps. If your skin becomes reddened/irritated stop using the CHG.  Do not shave (including legs and underarms) for at least 48 hours prior to first CHG shower. It is OK to shave your face.  Please follow these instructions carefully.     Shower the NIGHT BEFORE SURGERY and the MORNING OF SURGERY with CHG Soap.   If you chose to wash your hair, wash your hair first as usual with your normal shampoo. After you shampoo, rinse your hair and body thoroughly to remove the shampoo.  Then [employer] and genitals (private parts) with your normal soap and rinse thoroughly to remove soap.  After that Use CHG Soap as you would any other liquid soap. You can apply CHG directly to the skin and wash gently with a scrungie or a clean washcloth.   Apply the CHG Soap to your body ONLY FROM THE NECK DOWN.  Do not use on open wounds or open sores. Avoid contact with your eyes, ears, mouth and genitals (private parts). Wash Face and genitals (private parts)  with your normal soap.   Wash thoroughly, paying special attention to the area where your surgery will be performed.  Thoroughly rinse your body with warm water from the neck down.  DO NOT shower/wash with your normal soap after using and rinsing off the CHG Soap.  Pat yourself dry with a CLEAN TOWEL.  Wear CLEAN  PAJAMAS to bed the night before surgery  Place CLEAN SHEETS on your bed the night before your surgery  DO NOT SLEEP WITH PETS.   Day of Surgery:  Take a shower with CHG soap. Wear Clean/Comfortable clothing the morning of surgery Do not apply any deodorants/lotions.   Remember to brush your teeth WITH YOUR REGULAR TOOTHPASTE.   Please read over the following fact sheets that you were given.

## 2021-01-21 NOTE — Progress Notes (Signed)
[facility] CSW Psychosocial Distress Screening  Social Work was referred by distress screening protocol.  The patient scored a 5 on the Psychosocial Distress Thermometer which indicates moderate distress. Social Work Theatre manager contacted patient by phone to assess for distress and other psychosocial needs.   Patient reports that she is doing ok and is working to accept her diagnosis since "there's nothing I can do about it, except pray". She denied any resource/ financial needs at this time.  Intern and patient discussed common feeling and emotions when being diagnosed with cancer, and the importance of support during treatment. Intern informed patient of the support team and support services at [facility]. Intern provided contact information and encouraged patient to call with any questions or concerns.   ONCBCN DISTRESS SCREENING 01/21/2021  Screening Type Initial Screening  Distress experienced in past week (1-10) 5  Family Problem type Other (comment)    Rosary Lively, Social Work Intern Supervised by Gwinda Maine, LCSW

## 2021-01-22 ENCOUNTER — Other Ambulatory Visit: Payer: Self-pay

## 2021-01-22 ENCOUNTER — Encounter (HOSPITAL_COMMUNITY): Payer: Self-pay

## 2021-01-22 ENCOUNTER — Encounter (HOSPITAL_COMMUNITY)
Admission: RE | Admit: 2021-01-22 | Discharge: 2021-01-22 | Disposition: A | Discharge status: Home / Self Care | Payer: Managed Care, Other (non HMO) | Source: Ambulatory Visit | Attending: Surgery | Admitting: Surgery

## 2021-01-22 VITALS — BP 140/103 | HR 80 | Temp 98.1°F | Resp 17 | Ht 60.0 in | Wt 125.0 lb

## 2021-01-22 DIAGNOSIS — Z20822 Contact with and (suspected) exposure to covid-19: Secondary | ICD-10-CM | POA: Insufficient documentation

## 2021-01-22 DIAGNOSIS — Z01818 Encounter for other preprocedural examination: Secondary | ICD-10-CM | POA: Insufficient documentation

## 2021-01-22 LAB — SARS CORONAVIRUS 2 (TAT 6-24 HRS): SARS Coronavirus 2: NEGATIVE

## 2021-01-22 NOTE — Progress Notes (Signed)
PCP - Dr. Silvestre Mesi Cardiologist - denies  PPM/ICD - n/a Device Orders - n/a Rep Notified - n/a  Chest x-ray - n/a EKG - n/a Stress Test - n/a ECHO - 01/21/21. Results pending. Cardiac Cath - denies  Sleep Study - denies CPAP - n/a  Fasting Blood Sugar - n/a Checks Blood Sugar _____ times a day- n/a  Blood Thinner Instructions: n/a Aspirin Instructions: n/a  ERAS Protcol - Yes PRE-SURGERY Ensure or G2- Ensure  COVID TEST- 01/22/21. Pending.    Anesthesia review: Yes. Spoke with Lorretta Harp, PA. Patient's BP was 140/103 upon arrival to PAT and 141/104 when rechecked prior to leaving. Patient states she does not have hypertension but takes Metoprolol for tachycardia. Patient confirms that she can climb two flights of stairs without shortness of breath. Per Lorretta Harp, PA he will review echocardiogram results and no additional testing is needed. Patient had a CBC and CMP done on 01/15/21.   Patient denies shortness of breath, fever, cough and chest pain at PAT appointment   All instructions explained to the patient, with a verbal understanding of the material. Patient agrees to go over the instructions while at home for a better understanding. Patient also instructed to self quarantine after being tested for COVID-19. The opportunity to ask questions was provided.

## 2021-01-23 ENCOUNTER — Encounter (HOSPITAL_COMMUNITY)
Admission: RE | Disposition: A | Discharge status: Home / Self Care | Payer: Self-pay | Source: Ambulatory Visit | Attending: Surgery

## 2021-01-23 ENCOUNTER — Observation Stay (HOSPITAL_COMMUNITY)
Admission: RE | Admit: 2021-01-23 | Discharge: 2021-01-24 | Disposition: A | Discharge status: Home / Self Care | Payer: Managed Care, Other (non HMO) | Source: Ambulatory Visit | Attending: Surgery | Admitting: Surgery

## 2021-01-23 ENCOUNTER — Ambulatory Visit (HOSPITAL_COMMUNITY): Payer: Managed Care, Other (non HMO)

## 2021-01-23 ENCOUNTER — Observation Stay (HOSPITAL_COMMUNITY): Payer: Managed Care, Other (non HMO)

## 2021-01-23 ENCOUNTER — Other Ambulatory Visit: Payer: Self-pay

## 2021-01-23 ENCOUNTER — Ambulatory Visit (HOSPITAL_COMMUNITY): Payer: Managed Care, Other (non HMO) | Admitting: Physician Assistant

## 2021-01-23 ENCOUNTER — Encounter (HOSPITAL_COMMUNITY): Payer: Self-pay | Admitting: Surgery

## 2021-01-23 ENCOUNTER — Telehealth: Payer: Self-pay | Admitting: *Deleted

## 2021-01-23 DIAGNOSIS — Z419 Encounter for procedure for purposes other than remedying health state, unspecified: Secondary | ICD-10-CM

## 2021-01-23 DIAGNOSIS — Z79899 Other long term (current) drug therapy: Secondary | ICD-10-CM | POA: Insufficient documentation

## 2021-01-23 DIAGNOSIS — Z170 Estrogen receptor positive status [ER+]: Secondary | ICD-10-CM | POA: Diagnosis not present

## 2021-01-23 DIAGNOSIS — C50411 Malignant neoplasm of upper-outer quadrant of right female breast: Principal | ICD-10-CM | POA: Insufficient documentation

## 2021-01-23 DIAGNOSIS — Z9013 Acquired absence of bilateral breasts and nipples: Secondary | ICD-10-CM

## 2021-01-23 DIAGNOSIS — R0902 Hypoxemia: Secondary | ICD-10-CM

## 2021-01-23 DIAGNOSIS — C50911 Malignant neoplasm of unspecified site of right female breast: Secondary | ICD-10-CM

## 2021-01-23 HISTORY — PX: SIMPLE MASTECTOMY WITH AXILLARY SENTINEL NODE BIOPSY: SHX6098

## 2021-01-23 HISTORY — PX: SENTINEL NODE BIOPSY: SHX6608

## 2021-01-23 HISTORY — PX: PORTACATH PLACEMENT: SHX2246

## 2021-01-23 SURGERY — SIMPLE MASTECTOMY
Anesthesia: General | Site: Chest | Laterality: Right

## 2021-01-23 MED ORDER — ONDANSETRON 4 MG PO TBDP: 4.0000 mg | ORAL_TABLET | Freq: Four times a day (QID) | ORAL | Status: DC | PRN

## 2021-01-23 MED ORDER — ACETAMINOPHEN 10 MG/ML IV SOLN
1000.0000 mg | Freq: Once | INTRAVENOUS | Status: DC | PRN
  Administered 2021-01-23: 21:00:00 1000 mg via INTRAVENOUS

## 2021-01-23 MED ORDER — ENOXAPARIN SODIUM 40 MG/0.4ML IJ SOSY
40.0000 mg | PREFILLED_SYRINGE | INTRAMUSCULAR | Status: DC
  Administered 2021-01-24: 40 mg via SUBCUTANEOUS
  Filled 2021-01-23: qty 0.4

## 2021-01-23 MED ORDER — HEPARIN 6000 UNIT IRRIGATION SOLUTION
Status: DC | PRN
  Administered 2021-01-23: 1

## 2021-01-23 MED ORDER — ONDANSETRON HCL 4 MG/2ML IJ SOLN: 4.0000 mg | Freq: Four times a day (QID) | INTRAMUSCULAR | Status: DC | PRN

## 2021-01-23 MED ORDER — CHLORHEXIDINE GLUCONATE CLOTH 2 % EX PADS: 6.0000 | MEDICATED_PAD | Freq: Once | CUTANEOUS | Status: DC

## 2021-01-23 MED ORDER — DIPHENHYDRAMINE HCL 50 MG/ML IJ SOLN: 25.0000 mg | Freq: Four times a day (QID) | INTRAMUSCULAR | Status: DC | PRN

## 2021-01-23 MED ORDER — LEVOTHYROXINE SODIUM 25 MCG PO TABS
25.0000 ug | ORAL_TABLET | Freq: Every day | ORAL | Status: DC
  Administered 2021-01-24: 25 ug via ORAL
  Filled 2021-01-23: qty 1

## 2021-01-23 MED ORDER — HYDROMORPHONE HCL 1 MG/ML IJ SOLN: 1.0000 mg | INTRAMUSCULAR | Status: DC | PRN

## 2021-01-23 MED ORDER — AMISULPRIDE (ANTIEMETIC) 5 MG/2ML IV SOLN: 10.0000 mg | Freq: Once | INTRAVENOUS | Status: DC | PRN

## 2021-01-23 MED ORDER — HEMOSTATIC AGENTS (NO CHARGE) OPTIME
TOPICAL | Status: DC | PRN
  Administered 2021-01-23 (×4): 1 via TOPICAL

## 2021-01-23 MED ORDER — TRAMADOL HCL 50 MG PO TABS
50.0000 mg | ORAL_TABLET | Freq: Four times a day (QID) | ORAL | Status: DC | PRN
  Administered 2021-01-23: 50 mg via ORAL
  Filled 2021-01-23: qty 1

## 2021-01-23 MED ORDER — HEPARIN SOD (PORK) LOCK FLUSH 100 UNIT/ML IV SOLN
INTRAVENOUS | Status: DC | PRN
  Administered 2021-01-23: 500 [IU] via INTRAVENOUS

## 2021-01-23 MED ORDER — BUPIVACAINE-EPINEPHRINE 0.25% -1:200000 IJ SOLN
INTRAMUSCULAR | Status: DC | PRN
  Administered 2021-01-23: 10 mL

## 2021-01-23 MED ORDER — BUPIVACAINE-EPINEPHRINE (PF) 0.25% -1:200000 IJ SOLN
INTRAMUSCULAR | Status: AC
  Filled 2021-01-23: qty 30

## 2021-01-23 MED ORDER — ACETAMINOPHEN 325 MG PO TABS: 650.0000 mg | ORAL_TABLET | Freq: Four times a day (QID) | ORAL | Status: DC | PRN

## 2021-01-23 MED ORDER — LIDOCAINE 2% (20 MG/ML) 5 ML SYRINGE
INTRAMUSCULAR | Status: DC | PRN
  Administered 2021-01-23: 100 mg via INTRAVENOUS

## 2021-01-23 MED ORDER — ROCURONIUM BROMIDE 10 MG/ML (PF) SYRINGE
PREFILLED_SYRINGE | INTRAVENOUS | Status: DC | PRN
  Administered 2021-01-23: 50 mg via INTRAVENOUS
  Administered 2021-01-23: 30 mg via INTRAVENOUS
  Administered 2021-01-23: 20 mg via INTRAVENOUS

## 2021-01-23 MED ORDER — PROPOFOL 10 MG/ML IV BOLUS
INTRAVENOUS | Status: DC | PRN
  Administered 2021-01-23: 140 mg via INTRAVENOUS

## 2021-01-23 MED ORDER — BUPIVACAINE LIPOSOME 1.3 % IJ SUSP
INTRAMUSCULAR | Status: DC | PRN
  Administered 2021-01-23 (×2): 10 mL

## 2021-01-23 MED ORDER — DIPHENHYDRAMINE HCL 25 MG PO CAPS: 25.0000 mg | ORAL_CAPSULE | Freq: Four times a day (QID) | ORAL | Status: DC | PRN

## 2021-01-23 MED ORDER — MIDAZOLAM HCL 2 MG/2ML IJ SOLN
INTRAMUSCULAR | Status: AC
  Filled 2021-01-23: qty 2

## 2021-01-23 MED ORDER — CHLORHEXIDINE GLUCONATE 0.12 % MT SOLN
15.0000 mL | Freq: Once | OROMUCOSAL | Status: AC
  Administered 2021-01-23: 14:00:00 15 mL via OROMUCOSAL
  Filled 2021-01-23: qty 15

## 2021-01-23 MED ORDER — 0.9 % SODIUM CHLORIDE (POUR BTL) OPTIME
TOPICAL | Status: DC | PRN
  Administered 2021-01-23 (×2): 1000 mL

## 2021-01-23 MED ORDER — ORAL CARE MOUTH RINSE: 15.0000 mL | Freq: Once | OROMUCOSAL | Status: AC

## 2021-01-23 MED ORDER — FENTANYL CITRATE (PF) 100 MCG/2ML IJ SOLN
INTRAMUSCULAR | Status: AC
  Administered 2021-01-23: 15:00:00 100 ug via INTRAVENOUS
  Filled 2021-01-23: qty 2

## 2021-01-23 MED ORDER — 0.9 % SODIUM CHLORIDE (POUR BTL) OPTIME
TOPICAL | Status: DC | PRN
  Administered 2021-01-23: 19:00:00 1000 mL

## 2021-01-23 MED ORDER — SUGAMMADEX SODIUM 200 MG/2ML IV SOLN
INTRAVENOUS | Status: DC | PRN
  Administered 2021-01-23: 200 mg via INTRAVENOUS

## 2021-01-23 MED ORDER — FENTANYL CITRATE (PF) 250 MCG/5ML IJ SOLN
INTRAMUSCULAR | Status: DC | PRN
  Administered 2021-01-23 (×3): 50 ug via INTRAVENOUS

## 2021-01-23 MED ORDER — MIDAZOLAM HCL 2 MG/2ML IJ SOLN
INTRAMUSCULAR | Status: DC | PRN
Start: 2021-01-23 — End: 2021-01-23
  Administered 2021-01-23: 2 mg via INTRAVENOUS

## 2021-01-23 MED ORDER — SIMVASTATIN 20 MG PO TABS
20.0000 mg | ORAL_TABLET | Freq: Every day | ORAL | Status: DC
  Administered 2021-01-23: 22:00:00 20 mg via ORAL
  Filled 2021-01-23: qty 1

## 2021-01-23 MED ORDER — ACETAMINOPHEN 650 MG RE SUPP
650.0000 mg | Freq: Four times a day (QID) | RECTAL | Status: DC | PRN
  Filled 2021-01-23: qty 1

## 2021-01-23 MED ORDER — FENTANYL CITRATE (PF) 100 MCG/2ML IJ SOLN: 100.0000 ug | Freq: Once | INTRAMUSCULAR | Status: AC

## 2021-01-23 MED ORDER — LACTATED RINGERS IV SOLN: INTRAVENOUS | Status: DC

## 2021-01-23 MED ORDER — ONDANSETRON HCL 4 MG/2ML IJ SOLN: 4.0000 mg | Freq: Once | INTRAMUSCULAR | Status: DC | PRN

## 2021-01-23 MED ORDER — ACETAMINOPHEN 10 MG/ML IV SOLN
INTRAVENOUS | Status: AC
  Filled 2021-01-23: qty 100

## 2021-01-23 MED ORDER — PANTOPRAZOLE SODIUM 40 MG PO TBEC
40.0000 mg | DELAYED_RELEASE_TABLET | Freq: Every day | ORAL | Status: DC
  Administered 2021-01-24: 40 mg via ORAL
  Filled 2021-01-23: qty 1

## 2021-01-23 MED ORDER — MIDAZOLAM HCL 2 MG/2ML IJ SOLN
INTRAMUSCULAR | Status: AC
  Administered 2021-01-23: 15:00:00 2 mg via INTRAVENOUS
  Filled 2021-01-23: qty 2

## 2021-01-23 MED ORDER — OXYCODONE HCL 5 MG/5ML PO SOLN: 5.0000 mg | Freq: Once | ORAL | Status: DC | PRN

## 2021-01-23 MED ORDER — FENTANYL CITRATE (PF) 250 MCG/5ML IJ SOLN
INTRAMUSCULAR | Status: AC
  Filled 2021-01-23: qty 5

## 2021-01-23 MED ORDER — BUPIVACAINE HCL (PF) 0.25 % IJ SOLN
INTRAMUSCULAR | Status: DC | PRN
  Administered 2021-01-23 (×2): 20 mL

## 2021-01-23 MED ORDER — METOPROLOL SUCCINATE ER 25 MG PO TB24
25.0000 mg | ORAL_TABLET | Freq: Every day | ORAL | Status: DC
  Administered 2021-01-24: 25 mg via ORAL
  Filled 2021-01-23: qty 1

## 2021-01-23 MED ORDER — KETOROLAC TROMETHAMINE 15 MG/ML IJ SOLN
15.0000 mg | Freq: Four times a day (QID) | INTRAMUSCULAR | Status: AC
  Administered 2021-01-23: 15 mg via INTRAVENOUS
  Filled 2021-01-23: qty 1

## 2021-01-23 MED ORDER — MELATONIN 5 MG PO TABS
10.0000 mg | ORAL_TABLET | Freq: Every day | ORAL | Status: DC
  Administered 2021-01-23: 10 mg via ORAL
  Filled 2021-01-23: qty 2

## 2021-01-23 MED ORDER — HEPARIN 6000 UNIT IRRIGATION SOLUTION
Status: AC
  Filled 2021-01-23: qty 500

## 2021-01-23 MED ORDER — MIDAZOLAM HCL 2 MG/2ML IJ SOLN: 2.0000 mg | Freq: Once | INTRAMUSCULAR | Status: AC

## 2021-01-23 MED ORDER — OXYCODONE HCL 5 MG PO TABS: 5.0000 mg | ORAL_TABLET | Freq: Once | ORAL | Status: DC | PRN

## 2021-01-23 MED ORDER — CLINDAMYCIN PHOSPHATE 900 MG/50ML IV SOLN
900.0000 mg | INTRAVENOUS | Status: AC
  Administered 2021-01-23: 17:00:00 900 mg via INTRAVENOUS
  Filled 2021-01-23: qty 50

## 2021-01-23 MED ORDER — ONDANSETRON HCL 4 MG/2ML IJ SOLN
INTRAMUSCULAR | Status: DC | PRN
  Administered 2021-01-23: 4 mg via INTRAVENOUS

## 2021-01-23 MED ORDER — OXYCODONE HCL 5 MG PO TABS: 5.0000 mg | ORAL_TABLET | ORAL | Status: DC | PRN

## 2021-01-23 MED ORDER — DEXAMETHASONE SODIUM PHOSPHATE 10 MG/ML IJ SOLN
INTRAMUSCULAR | Status: DC | PRN
  Administered 2021-01-23: 10 mg via INTRAVENOUS

## 2021-01-23 MED ORDER — PHENYLEPHRINE 40 MCG/ML (10ML) SYRINGE FOR IV PUSH (FOR BLOOD PRESSURE SUPPORT)
PREFILLED_SYRINGE | INTRAVENOUS | Status: DC | PRN
  Administered 2021-01-23: 160 ug via INTRAVENOUS
  Administered 2021-01-23: 80 ug via INTRAVENOUS

## 2021-01-23 MED ORDER — DEXTROSE-NACL 5-0.9 % IV SOLN: INTRAVENOUS | Status: DC

## 2021-01-23 MED ORDER — PHENYLEPHRINE HCL-NACL 20-0.9 MG/250ML-% IV SOLN
INTRAVENOUS | Status: DC | PRN
  Administered 2021-01-23: 25 ug/min via INTRAVENOUS

## 2021-01-23 MED ORDER — DEXMEDETOMIDINE (PRECEDEX) IN NS 20 MCG/5ML (4 MCG/ML) IV SYRINGE
PREFILLED_SYRINGE | INTRAVENOUS | Status: DC | PRN
  Administered 2021-01-23: 8 ug via INTRAVENOUS

## 2021-01-23 MED ORDER — KETOROLAC TROMETHAMINE 15 MG/ML IJ SOLN
15.0000 mg | Freq: Four times a day (QID) | INTRAMUSCULAR | Status: DC | PRN
  Administered 2021-01-24: 15 mg via INTRAVENOUS
  Filled 2021-01-23 (×2): qty 1

## 2021-01-23 MED ORDER — MAGTRACE LYMPHATIC TRACER
INTRAMUSCULAR | Status: DC | PRN
  Administered 2021-01-23: 19:00:00 2 mL via INTRAMUSCULAR

## 2021-01-23 MED ORDER — METHOCARBAMOL 500 MG PO TABS: 500.0000 mg | ORAL_TABLET | Freq: Four times a day (QID) | ORAL | Status: DC | PRN

## 2021-01-23 MED ORDER — HYDROMORPHONE HCL 1 MG/ML IJ SOLN: 0.2500 mg | INTRAMUSCULAR | Status: DC | PRN

## 2021-01-23 MED ORDER — HEPARIN SOD (PORK) LOCK FLUSH 100 UNIT/ML IV SOLN
INTRAVENOUS | Status: AC
  Filled 2021-01-23: qty 5

## 2021-01-23 SURGICAL SUPPLY — 61 items
APPLIER CLIP 13 LRG OPEN (CLIP) ×4
APPLIER CLIP 9.375 MED OPEN (MISCELLANEOUS) ×8
BAG COUNTER SPONGE SURGICOUNT (BAG) ×8 IMPLANT
BAG DECANTER FOR FLEXI CONT (MISCELLANEOUS) ×4 IMPLANT
BINDER BREAST LRG (GAUZE/BANDAGES/DRESSINGS) ×4 IMPLANT
BINDER BREAST XLRG (GAUZE/BANDAGES/DRESSINGS) IMPLANT
BIOPATCH RED 1 DISK 7.0 (GAUZE/BANDAGES/DRESSINGS) ×16 IMPLANT
CANISTER SUCT 3000ML PPV (MISCELLANEOUS) ×4 IMPLANT
CHLORAPREP W/TINT 26 (MISCELLANEOUS) ×8 IMPLANT
CLIP APPLIE 13 LRG OPEN (CLIP) ×3 IMPLANT
CLIP APPLIE 9.375 MED OPEN (MISCELLANEOUS) ×6 IMPLANT
CNTNR URN SCR LID CUP LEK RST (MISCELLANEOUS) ×3 IMPLANT
CONT SPEC 4OZ STRL OR WHT (MISCELLANEOUS) ×4
COVER SURGICAL LIGHT HANDLE (MISCELLANEOUS) ×8 IMPLANT
COVER TRANSDUCER ULTRASND GEL (DISPOSABLE) ×4 IMPLANT
DERMABOND ADVANCED (GAUZE/BANDAGES/DRESSINGS) ×4
DERMABOND ADVANCED .7 DNX12 (GAUZE/BANDAGES/DRESSINGS) ×12 IMPLANT
DRAIN CHANNEL 19F RND (DRAIN) ×8 IMPLANT
DRAPE C-ARM 42X120 X-RAY (DRAPES) ×4 IMPLANT
DRAPE LAPAROSCOPIC ABDOMINAL (DRAPES) ×4 IMPLANT
DRSG PAD ABDOMINAL 8X10 ST (GAUZE/BANDAGES/DRESSINGS) ×8 IMPLANT
DRSG TEGADERM 4X4.75 (GAUZE/BANDAGES/DRESSINGS) ×8 IMPLANT
ELECT CAUTERY BLADE 6.4 (BLADE) ×4 IMPLANT
ELECT REM PT RETURN 9FT ADLT (ELECTROSURGICAL) ×4
ELECTRODE REM PT RTRN 9FT ADLT (ELECTROSURGICAL) ×3 IMPLANT
EVACUATOR SILICONE 100CC (DRAIN) ×8 IMPLANT
GAUZE 4X4 16PLY ~~LOC~~+RFID DBL (SPONGE) ×4 IMPLANT
GAUZE SPONGE 4X4 12PLY STRL (GAUZE/BANDAGES/DRESSINGS) IMPLANT
GEL ULTRASOUND 20GR AQUASONIC (MISCELLANEOUS) ×4 IMPLANT
GLOVE SRG 8 PF TXTR STRL LF DI (GLOVE) ×3 IMPLANT
GLOVE SURG ENC MOIS LTX SZ8 (GLOVE) ×4 IMPLANT
GLOVE SURG UNDER POLY LF SZ8 (GLOVE) ×4
GOWN STRL REUS W/ TWL LRG LVL3 (GOWN DISPOSABLE) ×9 IMPLANT
GOWN STRL REUS W/ TWL XL LVL3 (GOWN DISPOSABLE) ×3 IMPLANT
GOWN STRL REUS W/TWL LRG LVL3 (GOWN DISPOSABLE) ×12
GOWN STRL REUS W/TWL XL LVL3 (GOWN DISPOSABLE) ×4
HEMOSTAT ARISTA ABSORB 3G PWDR (HEMOSTASIS) ×24 IMPLANT
KIT BASIN OR (CUSTOM PROCEDURE TRAY) ×4 IMPLANT
KIT PORT POWER 8FR ISP CVUE (Port) ×4 IMPLANT
KIT TURNOVER KIT B (KITS) ×4 IMPLANT
NS IRRIG 1000ML POUR BTL (IV SOLUTION) ×12 IMPLANT
PACK GENERAL/GYN (CUSTOM PROCEDURE TRAY) ×4 IMPLANT
PAD ARMBOARD 7.5X6 YLW CONV (MISCELLANEOUS) ×8 IMPLANT
PENCIL SMOKE EVACUATOR (MISCELLANEOUS) ×4 IMPLANT
POSITIONER HEAD DONUT 9IN (MISCELLANEOUS) ×4 IMPLANT
SET INTRODUCER 12FR PACEMAKER (INTRODUCER) IMPLANT
SET SHEATH INTRODUCER 10FR (MISCELLANEOUS) IMPLANT
SPECIMEN JAR X LARGE (MISCELLANEOUS) IMPLANT
SPONGE T-LAP 18X18 ~~LOC~~+RFID (SPONGE) ×12 IMPLANT
SUT ETHILON 3 0 FSL (SUTURE) IMPLANT
SUT MNCRL AB 4-0 PS2 18 (SUTURE) ×12 IMPLANT
SUT PROLENE 2 0 SH 30 (SUTURE) IMPLANT
SUT VIC AB 3-0 SH 18 (SUTURE) ×8 IMPLANT
SUT VIC AB 3-0 SH 27 (SUTURE)
SUT VIC AB 3-0 SH 27X BRD (SUTURE) IMPLANT
SUT VIC AB 3-0 SH 8-18 (SUTURE) ×16 IMPLANT
TOWEL GREEN STERILE (TOWEL DISPOSABLE) ×4 IMPLANT
TOWEL GREEN STERILE FF (TOWEL DISPOSABLE) ×4 IMPLANT
TRACER MAGTRACE VIAL (MISCELLANEOUS) ×4 IMPLANT
TRAY LAPAROSCOPIC MC (CUSTOM PROCEDURE TRAY) ×4 IMPLANT
TUBE CONNECTING 12X1/4 (SUCTIONS) ×4 IMPLANT

## 2021-01-23 NOTE — Discharge Instructions (Signed)
[facility] [phone]  MASTECTOMY: POST OP INSTRUCTIONS  Always review your discharge instruction sheet given to you by the facility where your surgery was performed. IF YOU HAVE DISABILITY OR FAMILY LEAVE FORMS, YOU MUST BRING THEM TO THE OFFICE FOR PROCESSING.   DO NOT GIVE THEM TO YOUR DOCTOR. A prescription for pain medication may be given to you upon discharge.  Take your pain medication as prescribed, if needed.  If narcotic pain medicine is not needed, then you may take acetaminophen (Tylenol) or ibuprofen (Advil) as needed. Take your usually prescribed medications unless otherwise directed. If you need a refill on your pain medication, please contact your pharmacy.  They will contact our office to request authorization.  Prescriptions will not be filled after 5pm or on week-ends. You should follow a light diet the first few days after arrival home, such as soup and crackers, etc.  Resume your normal diet the day after surgery. Most patients will experience some swelling and bruising on the chest and underarm.  Ice packs will help.  Swelling and bruising can take several days to resolve.  It is common to experience some constipation if taking pain medication after surgery.  Increasing fluid intake and taking a stool softener (such as Colace) will usually help or prevent this problem from occurring.  A mild laxative (Milk of Magnesia or Miralax) should be taken according to package instructions if there are no bowel movements after 48 hours. Unless discharge instructions indicate otherwise, leave your bandage dry and in place until your next appointment in 3-5 days.  You may take a limited sponge bath.  No tube baths or showers until the drains are removed.  You may have steri-strips (small skin tapes) in place directly over the incision.  These strips should be left on the skin for 7-10 days.  If your surgeon used skin glue on the incision, you may shower in 24 hours.   The glue will flake off over the next 2-3 weeks.  Any sutures or staples will be removed at the office during your follow-up visit. DRAINS:  If you have drains in place, it is important to keep a list of the amount of drainage produced each day in your drains.  Before leaving the hospital, you should be instructed on drain care.  Call our office if you have any questions about your drains. ACTIVITIES:  You may resume regular (light) daily activities beginning the next day--such as daily self-care, walking, climbing stairs--gradually increasing activities as tolerated.  You may have sexual intercourse when it is comfortable.  Refrain from any heavy lifting or straining until approved by your doctor. You may drive when you are no longer taking prescription pain medication, you can comfortably wear a seatbelt, and you can safely maneuver your car and apply brakes. RETURN TO WORK:  __________________________________________________________ You should see your doctor in the office for a follow-up appointment approximately 3-5 days after your surgery.  Your doctor's nurse will typically make your follow-up appointment when she calls you with your pathology report.  Expect your pathology report 2-3 business days after your surgery.  You may call to check if you do not hear from us after three days.   OTHER INSTRUCTIONS: ______________________________________________________________________________________________ ____________________________________________________________________________________________ WHEN TO CALL YOUR DOCTOR: Fever over 101.0 Nausea and/or vomiting Extreme swelling or bruising Continued bleeding from incision. Increased pain, redness, or drainage from the incision. The clinic staff is available to answer your questions during regular business hours.  Please don't hesitate   to call and ask to speak to one of the nurses for clinical concerns.  If you have a medical emergency, go to the  nearest emergency room or call 911.  A surgeon from [facility] is always on call at the hospital. [address], [place], [place], [place]  [address] ? [address], [place], [place]   [address] [phone] ? [phone] ? FAX [phone] Web site: [email or web address]        PORT-A-CATH: [place]  Always review your discharge instruction sheet given to you by the facility where your surgery was performed.   A prescription for pain medication may be given to you upon discharge. Take your pain medication as prescribed, if needed. If narcotic pain medicine is not needed, then you make take acetaminophen (Tylenol) or ibuprofen (Advil) as needed.  Take your usually prescribed medications unless otherwise directed. If you need a refill on your pain medication, please contact our office. All narcotic pain medicine now requires a paper prescription.  Phoned in and fax refills are no longer allowed by law.  Prescriptions will not be filled after 5 pm or on weekends.  You should follow a light diet for the remainder of the day after your procedure. Most patients will experience some mild swelling and/or bruising in the area of the incision. It may take several days to resolve. It is common to experience some constipation if taking pain medication after surgery. Increasing fluid intake and taking a stool softener (such as Colace) will usually help or prevent this problem from occurring. A mild laxative (Milk of Magnesia or Miralax) should be taken according to package directions if there are no bowel movements after 48 hours.  Unless discharge instructions indicate otherwise, you may remove your bandages 48 hours after surgery, and you may shower at that time. You may have steri-strips (small white skin tapes) in place directly over the incision.  These strips should be left on the skin for 7-10 days.  If your surgeon used Dermabond (skin glue) on the incision, you may shower in  24 hours.  The glue will flake off over the next 2-3 weeks.  If your port is left accessed at the end of surgery (needle left in port), the dressing cannot get wet and should only by changed by a healthcare professional. When the port is no longer accessed (when the needle has been removed), follow step 7.   ACTIVITIES:  Limit activity involving your arms for the next 72 hours. Do no strenuous exercise or activity for 1 week. You may drive when you are no longer taking prescription pain medication, you can comfortably wear a seatbelt, and you can maneuver your car. 10.You may need to see your doctor in the office for a follow-up appointment.  Please       check with your doctor.  11.When you receive a new Port-a-Cath, you will get a product guide and        ID card.  Please keep them in case you need them.  WHEN TO CALL YOUR DOCTOR [phone]): Fever over 101.0 Chills Continued bleeding from incision Increased redness and tenderness at the site Shortness of breath, difficulty breathing   The clinic staff is available to answer your questions during regular business hours. Please don't hesitate to call and ask to speak to one of the nurses or medical assistants for clinical concerns. If you have a medical emergency, go to the nearest emergency room or call 911.  A surgeon from [facility] is always on call at the hospital.     For further information, please visit [email or web address]

## 2021-01-23 NOTE — Anesthesia Preprocedure Evaluation (Addendum)
Anesthesia Evaluation  Patient identified by MRN, date of birth, ID band Patient awake    Reviewed: Allergy & Precautions, NPO status , Patient's Chart, lab work & pertinent test results  Airway Mallampati: II  TM Distance: >3 FB Neck ROM: Full    Dental no notable dental hx. (+) Teeth Intact, Dental Advisory Given   Pulmonary    Pulmonary exam normal breath sounds clear to auscultation       Cardiovascular Exercise Tolerance: Good negative cardio ROS Normal cardiovascular exam Rhythm:Regular Rate:Normal     Neuro/Psych  Headaches, Anxiety    GI/Hepatic negative GI ROS, Neg liver ROS,   Endo/Other  Hypothyroidism   Renal/GU negative Renal ROSLab Results      Component                Value               Date                      CREATININE               0.83                01/15/2021                BUN                      11                  01/15/2021                NA                       142                 01/15/2021                K                        3.5                 01/15/2021                CL                       108                 01/15/2021                CO2                      24                  01/15/2021                Musculoskeletal negative musculoskeletal ROS (+)   Abdominal   Peds  Hematology Lab Results      Component                Value               Date                      WBC  5.8                 01/15/2021                HGB                      11.8 (L)            01/15/2021                HCT                      36.4                01/15/2021                MCV                      88.1                01/15/2021                PLT                      194                 01/15/2021              Anesthesia Other Findings PCN  Reproductive/Obstetrics negative OB ROS                            Anesthesia Physical Anesthesia  Plan  ASA: 3  Anesthesia Plan: General   Post-op Pain Management:    Induction: Intravenous  PONV Risk Score and Plan: Treatment may vary due to age or medical condition, Ondansetron, Dexamethasone and Midazolam  Airway Management Planned: Oral ETT  Additional Equipment: None  Intra-op Plan:   Post-operative Plan: Extubation in OR  Informed Consent: I have reviewed the patients History and Physical, chart, labs and discussed the procedure including the risks, benefits and alternatives for the proposed anesthesia with the patient or authorized representative who has indicated his/her understanding and acceptance.     Dental advisory given  Plan Discussed with: CRNA and Anesthesiologist  Anesthesia Plan Comments: (ga  W BILAT pec)       Anesthesia Quick Evaluation

## 2021-01-23 NOTE — H&P (Signed)
History of Present Illness: Dana Kramer is a 55 y.o. female who is seen today as an office consultation at the request of Dr. Burr Medico for evaluation of Breast Cancer .   Patient presents in consultation for for right breast cancer. She is seen today in the multidisciplinary clinic. Patient denies any history of breast pain, nipple discharge issues with either breast. This was picked up on screening mammogram. She was noted to have a 7 mm mass right breast upper outer quadrant with 1.6 cm area of calcifications around it. 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 There is no family history of breast cancer. She has no other complaints except for some mild soreness at the biopsy site.  Review of Systems: A complete review of systems was obtained from the patient. I have reviewed this information and discussed as appropriate with the patient. See HPI as well for other ROS.    Medical History: Past Medical History:  Diagnosis Date   Anxiety   Hyperlipidemia   Thyroid disease   Patient Active Problem List  Diagnosis   Malignant neoplasm of upper-outer quadrant of right breast in female, estrogen receptor positive (CMS-HCC)   Past Surgical History:  Procedure Laterality Date   ABDOMINOPLASTY N/A 10/01/2010   CESAREAN SECTION N/A  Date Unknown   UMBILICAL HERNIA REPAIR N/A 10/01/2010    Allergies  Allergen Reactions   Penicillins Rash and Other (See Comments)  Mainly upper body only.   Current Outpatient Medications on File Prior to Visit  Medication Sig Dispense Refill   levothyroxine (SYNTHROID) 25 MCG tablet   metoprolol succinate (TOPROL-XL) 25 MG XL tablet   simvastatin (ZOCOR) 20 MG tablet   No current facility-administered medications on file prior to visit.   Family History  Problem Relation Age of Onset   High blood pressure (Hypertension) Mother    Social History   Tobacco Use  Smoking Status Never  Smokeless  Tobacco Never    Social History   Socioeconomic History   Marital status: Unknown  Tobacco Use   Smoking status: Never   Smokeless tobacco: Never  Substance and Sexual Activity   Alcohol use: Not Currently   Drug use: Not Currently   Objective:  There were no vitals filed for this visit.  There is no height or weight on file to calculate BMI.  Physical Exam Constitutional:  Appearance: Normal appearance.  HENT:  Head: Normocephalic.  Eyes:  General: No scleral icterus. Pupils: Pupils are equal, round, and reactive to light.  Cardiovascular:  Rate and Rhythm: Normal rate.  Pulmonary:  Effort: Pulmonary effort is normal.  Breath sounds: No stridor.  Chest:  Breasts: Right: Swelling and tenderness present. No mass or nipple discharge.  Left: Normal. No swelling, mass, nipple discharge or tenderness.  Musculoskeletal:  General: Normal range of motion.  Cervical back: Normal range of motion and neck supple.  Lymphadenopathy:  Upper Body:  Right upper body: No supraclavicular or axillary adenopathy.  Left upper body: No supraclavicular or axillary adenopathy.  Skin: General: Skin is warm and dry.  Neurological:  General: No focal deficit present.  Mental Status: She is alert and oriented to person, place, and time.  Psychiatric:  Mood and Affect: Mood normal.  Behavior: Behavior normal.     Labs, Imaging and Diagnostic Testing: Mammogram and ultrasound reviewed of both breast. On the right there is a 1.6 cm area of calcification distortion upper outer quadrant with a 7 mm mass. Pathology  shows invasive ductal carcinoma ER positive PR positive HER2/neu negative.  Assessment and Plan:  Diagnoses and all orders for this visit:  Malignant neoplasm of upper-outer quadrant of right breast in female, estrogen receptor positive (CMS-HCC)    Discussed breast conserving surgery with lumpectomy and sentinel lymph node mapping. Discussed mastectomy as well and the pros  and cons of each approach as well as long-term survival, quality of life issues, local regional recurrence and other complications of each. She desires bilateral mastectomy with the left side being risk reducing. Discussed the increased complication rate and the fact that the data does not show a survival advantage although there may be an advantage by reducing risks of new cancers since her risk is 0.5 %/year she desires bilateral simple mastectomy without reconstruction. Recommend sentinel lymph node mapping on the right. She will require chemotherapy due to her HER2/neu positivity. Lan on placing Port-A-Cath as well. Risk of port placement also discussed today which include but not exclusive of bleeding, infection, collapsed lung, bleeding around the lung, death, DVT, catheter migration, catheter malfunction, the need for catheter replacement and the need for other treatment center procedures. Risks mastectomy include bleeding, infection, flap necrosis, cosmetic deformity, chronic pain, stiffness, decreased range of motion, and the need further treatments and/or procedures. She agrees to proceed.  No follow-ups on file.  Kennieth Francois, MD

## 2021-01-23 NOTE — Anesthesia Procedure Notes (Addendum)
Anesthesia Regional Block: Pectoralis block   Pre-Anesthetic Checklist: , timeout performed,  Correct Patient, Correct Site, Correct Laterality,  Correct Procedure, Correct Position, site marked,  Risks and benefits discussed,  Surgical consent,  Pre-op evaluation,  At surgeon's request and post-op pain management  Laterality: Upper and Right  Prep: chloraprep       Needles:  Injection technique: Single-shot  Needle Type: Echogenic Needle     Needle Length: 9cm  Needle Gauge: 21     Additional Needles:   Procedures:,,,, ultrasound used (permanent image in chart),,    Narrative:  Start time: 01/23/2021 2:51 PM End time: 01/23/2021 2:57 PM Injection made incrementally with aspirations every 5 mL.  Performed by: Personally  Anesthesiologist: Barnet Glasgow, MD  Additional Notes: Block assessed. Patient tolerated procedure well.

## 2021-01-23 NOTE — Anesthesia Procedure Notes (Signed)
Procedure Name: Intubation Date/Time: 01/23/2021 4:40 PM Performed by: Annamary Carolin, CRNA Pre-anesthesia Checklist: Patient identified, Emergency Drugs available, Suction available and Patient being monitored Patient Re-evaluated:Patient Re-evaluated prior to induction Oxygen Delivery Method: Circle System Utilized Preoxygenation: Pre-oxygenation with 100% oxygen Induction Type: IV induction Ventilation: Mask ventilation without difficulty Laryngoscope Size: Mac and 3 Grade View: Grade III Tube type: Oral Number of attempts: 1 Airway Equipment and Method: Stylet Placement Confirmation: ETT inserted through vocal cords under direct vision, positive ETCO2 and breath sounds checked- equal and bilateral Secured at: 20 cm Tube secured with: Tape Dental Injury: Teeth and Oropharynx as per pre-operative assessment

## 2021-01-23 NOTE — Anesthesia Procedure Notes (Signed)
Anesthesia Regional Block: Pectoralis block   Pre-Anesthetic Checklist: , timeout performed,  Correct Patient, Correct Site, Correct Laterality,  Correct Procedure, Correct Position, site marked,  Risks and benefits discussed,  Surgical consent,  Pre-op evaluation,  At surgeon's request and post-op pain management  Laterality: Left and Upper  Prep: chloraprep       Needles:  Injection technique: Single-shot  Needle Type: Echogenic Needle     Needle Length: 9cm  Needle Gauge: 21     Additional Needles:   Procedures:,,,, ultrasound used (permanent image in chart),,    Narrative:  Start time: 01/23/2021 2:43 PM End time: 01/23/2021 2:50 PM Injection made incrementally with aspirations every 5 mL.  Performed by: Personally  Anesthesiologist: Barnet Glasgow, MD  Additional Notes: Block assessed. Patient tolerated procedure well.

## 2021-01-23 NOTE — Anesthesia Postprocedure Evaluation (Signed)
Anesthesia Post Note  Patient: Dana Kramer  Procedure(s) Performed: Right simple mastectomy ;  Left risk reducing simple mastectomy (Bilateral: Breast) Right axillary sentinel lymph node mapping using Mag Trace (Right: Breast) Right internal jugular 8 [place] PowerPort with C arm and ultrasound guidance (Right: Chest)     Patient location during evaluation: PACU Anesthesia Type: General Level of consciousness: awake and alert Pain management: pain level controlled Vital Signs Assessment: post-procedure vital signs reviewed and stable Respiratory status: spontaneous breathing, nonlabored ventilation, respiratory function stable and patient connected to nasal cannula oxygen Cardiovascular status: blood pressure returned to baseline and stable Postop Assessment: no apparent nausea or vomiting Anesthetic complications: no   No notable events documented.  Last Vitals:  Vitals:   01/23/21 2100 01/23/21 2115  BP: 137/88 138/90  Pulse: 87 92  Resp: 16 17  Temp:  36.6 C  SpO2: 100% 100%    Last Pain:  Vitals:   01/23/21 2143  TempSrc:   PainSc: 4                  Tillie Viverette P Jenella Craigie

## 2021-01-23 NOTE — Op Note (Signed)
Preoperative diagnosis: Stage I right breast cancer upper outer quadrant  Postoperative diagnosis: Same  Procedure: Right simple mastectomy with right axillary sentinel lymph node mapping using Mag Trace and placement of right internal jugular 8 [place] PowerPort with C arm and ultrasound guidance and left risk reducing simple mastectomy  Surgeon: Erroll Luna, MD  Anesthesia: General with bilateral pectoral blocks  EBL: 150 cc  Drains: 19 round drain 1 to each side  Specimen: Right breast to pathology, left breast pathology, for right axillary sentinel nodes to pathology  IV fluids: Per anesthesia record  Indications for procedure: The patient is a 55 year old female seen for stage I right breast cancer in the multidisciplinary clinic.  Options of breast conservation versus mastectomy with reconstruction were discussed as well as postoperative chemotherapy need given her tumor subtype.  After discussion of her options she desired strongly bilateral mastectomy with left being risk reducing.  We had a long conversation with the pros and cons of this approach.  We discussed potential long-term survival with breast conserving surgery versus mastectomy being essentially the same and local regional recurrence being slightly higher with breast conserving surgery but not considered significant.  We discussed lymph node surgery and the risk of lymphedema and pain.  We discussed chronic pain after mastectomy.  We discussed there is no survival advantage to bilateral mastectomies in her circumstances more likely.  After discussion of all the above she was still very adamant about undergoing bilateral mastectomies understanding the above.  We discussed port placement.  She will need postoperative chemotherapy.  We discussed the risk of port placement to include but exclusive of bleeding, infection, pneumothorax, hemothorax, injury to major vascular structure, cardiac injury, death, DVT, catheter migration,  catheter fragmentation and the need further treatments and/or procedures.The surgical and non surgical options have been discussed with the patient.  Risks of surgery include bleeding,  Infection,  Flap necrosis,  Tissue loss,  Chronic pain, death, Numbness,  And the need for additional procedures.  Reconstruction options also have been discussed with the patient as well.  The patient agrees to proceed. Sentinel lymph node mapping and dissection has been discussed with the patient.  Risk of bleeding,  Infection,  Seroma formation,  Additional procedures,,  Shoulder weakness ,  Shoulder stiffness,  Nerve and blood vessel injury and reaction to the mapping dyes have been discussed.  Alternatives to surgery have been discussed with the patient.  The patient agrees to proceed.       Description of procedure: The patient was met in the holding area.  She underwent pectoral block per anesthesia protocol.  All questions were answered.  She was then taken back to the operative room.  She was placed supine upon the OR table.  General anesthesia was initiated.  Initial timeout for injection of the right breast with 2 cc of mag trace.  The port was placed first.  Her right arm was tucked  and her right upper neck and upper chest region was prepped and draped in a sterile fashion.  Timeout performed again.  Proper patient, site and procedure verified.  Ultrasound was used to identify the right internal jugular vein with the patient in Trendelenburg.  Needle was advanced under ultrasound guidance with return of dark nonpulsatile blood.  A wire was fed easily through this.  C arm images revealed the wire to go down the superior vena cava through the inferior vena cava into the subdiaphragmatic vena cava.  There is no resistance.  She was  then flattened out.  A small stab incision was made at the wire insertion site.  A small pocket was made just below her right clavicle with incision of 4 cm.  Small pocket pocket was made  with cautery.  The port was assembled and flushed.  It was tunneled for lower incision to the upper incision.  The patient was placed back in Trendelenburg.  The dilator introducer was advanced over the wire moving the wire to and fro without resistance and it was in place.  The wire and dilator were removed leaving the introducer in place.  The catheter was cut to 21 cm.  It was placed through the introducer and the peel-away sheath was peeled away without difficulty.  C-arm image revealed the tip to be in the cavoatrial junction.  There is no obvious pneumothorax.  The port was flushed and drew back easily.  5 cc of concentrated heparin saline was placed.  The port incisions were closed with 3-0 Vicryl and 4 Monocryl.  This was then broken down and the patient's upper chest region was prepped and draped in a sterile fashion bilaterally.  A second timeout was performed.  The left side was done first.  Curvilinear incision was made above and below the nipple areolar complex.  Superior skin flap was taken to the clavicle and the inferior skin flap was taken to the inframammary fold.  This was taken to the midline.  We then mobilized laterally to the latissimus muscle.  The breast was dissected in a medial to lateral fashion using cautery.  Bleeding was controlled with combination of cautery clips and 3-0 Vicryl suture.  The breast was then removed and oriented.  This was then irrigated and made hemostatic and packed with moist dressings.  The right side was then done.  In similar fashion a superior and inferior skin flaps were made.  Dissection was carried to the clavicle and down to the inframammary fold.  This was taken to the midline.  We then mobilized the breast laterally to the latissimus muscle.  The breast was then dissected off the chest wall in a medial to lateral fashion with bleeding being controlled with cautery, clips and 3-0 Vicryl sutures.  Once lateral attachments were identified it was then  divided.  We then used the probe for the tracer to identify for sentinel nodes.  These were removed.  These were level 1 nodes.  Background counts approached baseline.  This was then irrigated.  Hemostasis achieved with cautery, clips as well as suture.  The specimen was oriented and sent to pathology.  The nodes were sent separately.  A 19 round drain was placed on both sides through a small stab incision to the inferior skin flap and secured to the skin with 2-0 nylon.  Arista was applied after irrigation to both cavities with excellent hemostasis.  We then closed the skin with combination of 3-0 Vicryl and 4-0 Monocryl.  Dermabond applied to all incisions.  Drains placed to bulb suction.  All counts were found to be correct.  A chest x-ray was obtained in the operating room due to some transient hypoxemia.  Her ET tube appeared to be down the right mainstem and once we adjusted that her oxygen hypoxemia resolved.  There is no complicating feature with the tip of the port being at the cavoatrial junction or little bit below that.  On the C-arm images it was a higher and a suspected set angle of the tube.  There is no  hemothorax or evidence of ectopy.  All counts are correct.  Breast binder placed.  She was then extubated taken the PACU in stable condition.

## 2021-01-23 NOTE — Interval H&P Note (Signed)
History and Physical Interval Note:  01/23/2021 2:38 PM  Dana Kramer  has presented today for surgery, with the diagnosis of RIGHT BREAST CANCER.  The various methods of treatment have been discussed with the patient and family. After consideration of risks, benefits and other options for treatment, the patient has consented to  Procedure(s): BILATERAL SIMPLE MASTECTOMY, LEFT RISK REDUCING (Bilateral) RIGHT SENTINEL LYMPH NODE MAPPING (Right) INSERTION PORT-A-CATH (N/A) as a surgical intervention.  The patient's history has been reviewed, patient examined, no change in status, stable for surgery.  I have reviewed the patient's chart and labs.  Questions were answered to the patient's satisfaction.     Turner Daniels MD   The surgical and non surgical options have been discussed with the patient.  Risks of surgery include bleeding,  Infection,  Flap necrosis,  Tissue loss,  Chronic pain, death, Numbness,  And the need for additional procedures.  Reconstruction options also have been discussed with the patient as well.  The patient agrees to proceed.   Risk of procedure include bleeding, infection, collapse lung, bleeding around the lung/heart death, DVT, catheter malfunction/migration Alternatives discussed.

## 2021-01-23 NOTE — Transfer of Care (Signed)
Immediate Anesthesia Transfer of Care Note  Patient: Dana Kramer  Procedure(s) Performed: BILATERAL SIMPLE MASTECTOMY, LEFT RISK REDUCING (Bilateral) RIGHT SENTINEL LYMPH NODE MAPPING (Right) INSERTION PORT-A-CATH  Patient Location: PACU  Anesthesia Type:GA combined with regional for post-op pain  Level of Consciousness: drowsy, patient cooperative and responds to stimulation  Airway & Oxygen Therapy: Patient Spontanous Breathing  Post-op Assessment: Report given to RN, Post -op Vital signs reviewed and stable and Patient moving all extremities  Post vital signs: Reviewed and stable  Last Vitals:  Vitals Value Taken Time  BP    Temp    Pulse 90 01/23/21 2006  Resp 24 01/23/21 2006  SpO2 96 % 01/23/21 2006  Vitals shown include unvalidated device data.  Last Pain:  Vitals:   01/23/21 1500  TempSrc:   PainSc: 0-No pain         Complications: No notable events documented.

## 2021-01-23 NOTE — Telephone Encounter (Signed)
Spoke with patient to let her know I have a letter for her to pick up regarding her sister being able to come to the [place] to help. Will leave it at the front desk for pickup. Patient verbalized understanding.

## 2021-01-24 ENCOUNTER — Encounter (HOSPITAL_COMMUNITY): Payer: Self-pay | Admitting: Surgery

## 2021-01-24 DIAGNOSIS — C50411 Malignant neoplasm of upper-outer quadrant of right female breast: Secondary | ICD-10-CM | POA: Diagnosis not present

## 2021-01-24 MED ORDER — OXYCODONE HCL 5 MG PO TABS: 5.0000 mg | ORAL_TABLET | Freq: Four times a day (QID) | ORAL | 0 refills | Status: DC | PRN

## 2021-01-24 MED ORDER — IBUPROFEN 800 MG PO TABS: 800.0000 mg | ORAL_TABLET | Freq: Three times a day (TID) | ORAL | 0 refills | Status: DC | PRN

## 2021-01-24 NOTE — Discharge Summary (Signed)
Physician Discharge Summary  Patient ID: Dana Kramer MRN: [id number] DOB/AGE: 1965/04/29 55 y.o.  Admit date: 01/23/2021 Discharge date: 01/24/2021  Admission Diagnoses: Stage I right breast cancer  Discharge Diagnoses: Same Principal Problem:   S/P mastectomy, bilateral Active Problems:   Breast cancer, stage 1, estrogen receptor positive, right [facility])   Discharged Condition: good  Hospital Course: Patient did well after bilateral mastectomies with port placement.  Her vital signs are normal.  Her saturations were normal.  She was on room air.  She was able to ambulate had good pain control and tolerating her diet.  Her wounds are clean dry intact JP drainage was minimal.       Treatments: surgery: Bilateral simple mastectomy with right axillary sentinel lymph node mapping and placement of right IJ port  Discharge Exam: Blood pressure (!) 148/74, pulse 78, temperature 98 F (36.7 C), temperature source Oral, resp. rate 19, height 5' (1.524 m), weight 56.7 kg, SpO2 96 %. General appearance: alert and cooperative Resp: clear to auscultation bilaterally Cardio: Normal sinus rhythm. Neurologic: Grossly normal Incision/Wound: Mastectomy flaps are viable.  No hematoma.  No signs of ischemia.  JP drainage serosanguineous.  No hematoma.  Port site clean dry and intact.  Disposition: Discharge disposition: 01-Home or Self Care       Discharge Instructions     Diet - low sodium heart healthy   Complete by: As directed    Increase activity slowly   Complete by: As directed       Allergies as of 01/24/2021       Reactions   Penicillins Rash   Mainly upper body only.        Medication List     STOP taking these medications    sertraline 50 MG tablet Commonly known as: ZOLOFT       TAKE these medications    Advil PM 200-25 MG Caps Generic drug: Ibuprofen-diphenhydrAMINE HCl Take 2 tablets by mouth daily as needed (pain).   b complex vitamins capsule Take  1 capsule by mouth daily.   CALCIUM + D PO Take 1 tablet by mouth daily.   ibuprofen 800 MG tablet Commonly known as: ADVIL Take 1 tablet (800 mg total) by mouth every 8 (eight) hours as needed.   levothyroxine 25 MCG tablet Commonly known as: SYNTHROID TAKE 1 TABLET(25 MCG) BY MOUTH DAILY BEFORE BREAKFAST   Melatonin 10 MG Caps Take 10 mg by mouth at bedtime.   metoprolol succinate 25 MG 24 hr tablet Commonly known as: TOPROL-XL Take 1 tablet (25 mg total) by mouth daily. Take with or immediately following a meal.   omeprazole 20 MG tablet Commonly known as: PRILOSEC OTC Take 20 mg by mouth daily.   oxyCODONE 5 MG immediate release tablet Commonly known as: Oxy IR/ROXICODONE Take 1 tablet (5 mg total) by mouth every 6 (six) hours as needed for severe pain.   simvastatin 20 MG tablet Commonly known as: ZOCOR TAKE 1 TABLET(20 MG) BY MOUTH AT BEDTIME   VITAMIN D PO Take 1 tablet by mouth daily.   WOMENS MULTIVITAMIN PO Take 1 tablet by mouth daily.         Signed: Turner Daniels MD 01/24/2021, 8:58 AM

## 2021-01-24 NOTE — Plan of Care (Signed)
  Problem: Education: Goal: Knowledge of General Education information will improve Description: Including pain rating scale, medication(s)/side effects and non-pharmacologic comfort measures Outcome: Progressing   Problem: Activity: Goal: Risk for activity intolerance will decrease Outcome: Progressing   Problem: Nutrition: Goal: Adequate nutrition will be maintained Outcome: Progressing   Problem: Coping: Goal: Level of anxiety will decrease Outcome: Progressing   Problem: Elimination: Goal: Will not experience complications related to urinary retention Outcome: Progressing   

## 2021-01-24 NOTE — Progress Notes (Signed)
Patient discharged to home with instructions, return demonstration and supplies given, all questions answered.

## 2021-01-27 ENCOUNTER — Encounter: Payer: Self-pay | Admitting: Genetic Counselor

## 2021-01-27 ENCOUNTER — Telehealth: Payer: Self-pay | Admitting: Genetic Counselor

## 2021-01-27 DIAGNOSIS — Z1379 Encounter for other screening for genetic and chromosomal anomalies: Secondary | ICD-10-CM | POA: Insufficient documentation

## 2021-01-27 NOTE — Telephone Encounter (Signed)
I contacted [name] to discuss her genetic testing results. No pathogenic variants were identified in the 47 genes analyzed.   The test report has been scanned into EPIC and is located under the Molecular Pathology section of the Results Review tab.  A portion of the result report is included below for reference. Detailed clinic note to follow.  [name], MS, [facility] Genetic Counselor [name] (P) [phone]

## 2021-01-28 ENCOUNTER — Other Ambulatory Visit (HOSPITAL_COMMUNITY): Payer: Managed Care, Other (non HMO)

## 2021-01-28 ENCOUNTER — Encounter: Payer: Self-pay | Admitting: *Deleted

## 2021-01-28 LAB — SURGICAL PATHOLOGY

## 2021-01-29 ENCOUNTER — Encounter (HOSPITAL_COMMUNITY): Payer: Self-pay | Admitting: Surgery

## 2021-02-03 ENCOUNTER — Ambulatory Visit: Payer: Self-pay | Admitting: Genetic Counselor

## 2021-02-03 DIAGNOSIS — Z1379 Encounter for other screening for genetic and chromosomal anomalies: Secondary | ICD-10-CM

## 2021-02-03 DIAGNOSIS — C50411 Malignant neoplasm of upper-outer quadrant of right female breast: Secondary | ICD-10-CM

## 2021-02-03 NOTE — Progress Notes (Signed)
HPI:   Dana Kramer was previously seen in the [facility] clinic due to a personal and family history of cancer and concerns regarding a hereditary predisposition to cancer. Please refer to our prior cancer genetics clinic note for more information regarding our discussion, assessment and recommendations, at the time. Dana Kramer recent genetic test results were disclosed to her, as were recommendations warranted by these results. These results and recommendations are discussed in more detail below.  CANCER HISTORY:  Oncology History Overview Note  Cancer Staging Malignant neoplasm of upper-outer quadrant of right breast in female, estrogen receptor positive ([place]) Staging form: Breast, AJCC 8th Edition - Clinical stage from 01/08/2021: Stage IA (cT1b, cN0, cM0, G2, ER+, PR+, HER2+) - Signed by Truitt Merle, MD on 01/15/2021    Malignant neoplasm of upper-outer quadrant of right breast in female, estrogen receptor positive ([place])  01/02/2021 Mammogram   EXAM: DIGITAL DIAGNOSTIC BILATERAL MAMMOGRAM WITH TOMOSYNTHESIS AND CAD; ULTRASOUND LEFT BREAST LIMITED; ULTRASOUND RIGHT BREAST LIMITED  IMPRESSION: 1. Highly suspicious 0.7 cm UPPER-OUTER RIGHT breast mass with associated suspicious calcifications extending 1.5 cm anteriorly. Tissue sampling is recommended. 2. No abnormal appearing RIGHT axillary lymph nodes. 3. Benign LOWER LEFT breast cyst corresponding to the LEFT breast screening study finding.   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  PROGNOSTIC INDICATORS Results: The tumor cells are 2+ for Her2 (EQUIVOCAL). Her2 by FISH will be performed and results reported separately. Estrogen Receptor: 100%, POSITIVE, STRONG STAINING INTENSITY Progesterone Receptor: 70%, POSITIVE, STRONG STAINING INTENSITY Proliferation Marker Ki67: 10%  FLUORESCENCE IN-SITU HYBRIDIZATION Results: GROUP 1: HER2 **POSITIVE**   01/13/2021 Initial Diagnosis   Malignant neoplasm of upper-outer quadrant of right breast in female, estrogen receptor positive ([place])    Genetic Testing   [employer] CustomNext Panel was Negative. Report date is 01/26/2021.   The CustomNext-Cancer+RNAinsight panel offered by Althia Forts includes sequencing and rearrangement analysis for the following 47 genes:  APC, ATM, AXIN2, BARD1, BMPR1A, BRCA1, BRCA2, BRIP1, CDH1, CDK4, CDKN2A, CHEK2, CTNNA1, DICER1, EPCAM, GREM1, HOXB13, KIT, MEN1, MLH1, MSH2, MSH3, MSH6, MUTYH, NBN, NF1, NTHL1, PALB2, PDGFRA, PMS2, POLD1, POLE, PTEN, RAD50, RAD51C, RAD51D, SDHA, SDHB, SDHC, SDHD, SMAD4, SMARCA4, STK11, TP53, TSC1, TSC2, and VHL.  RNA data is routinely analyzed for use in variant interpretation for all genes.     FAMILY HISTORY:  We obtained a detailed, 4-generation family history.  Significant diagnoses are listed below:      Family History  Problem Relation Age of Onset   Cancer Father 58        esophageal cancer   Leukemia Maternal Grandfather          dx. >50   Colon cancer Paternal Grandfather          dx. >50   Lung cancer Cousin          dx. 59s, did not smoke       Dana Kramer maternal  grandfather was diagnosed with leukemia and is deceased. Her father was diagnosed with esophageal cancer at age 45 and died due to cancer metastasis 6 months later. A paternal cousin was diagnosed with lung cancer in his 78s and he did not  smoke, he is deceased. Her paternal grandfather was diagnosed with colon cancer at an unknown age (>50) and died due to colon cancer metastasis.   Dana Kramer is unaware of previous family history of genetic testing for hereditary cancer risks. There is no reported Ashkenazi Jewish ancestry.   GENETIC TEST RESULTS:  The [employer] CustomNext Panel found no pathogenic mutations.  The CustomNext-Cancer+RNAinsight panel offered by Althia Forts includes sequencing and rearrangement analysis for the following 47 genes:  APC, ATM, AXIN2, BARD1, BMPR1A, BRCA1, BRCA2, BRIP1, CDH1, CDK4, CDKN2A, CHEK2, CTNNA1, DICER1, EPCAM, GREM1, HOXB13, KIT, MEN1, MLH1, MSH2, MSH3, MSH6, MUTYH, NBN, NF1, NTHL1, PALB2, PDGFRA, PMS2, POLD1, POLE, PTEN, RAD50, RAD51C, RAD51D, SDHA, SDHB, SDHC, SDHD, SMAD4, SMARCA4, STK11, TP53, TSC1, TSC2, and VHL.  RNA data is routinely analyzed for use in variant interpretation for all genes.  The test report has been scanned into EPIC and is located under the Molecular Pathology section of the Results Review tab.  A portion of the result report is included below for reference. Genetic testing reported out on 01/26/2021.       Even though a pathogenic variant was not identified, possible explanations for the cancer in the family may include: There may be no hereditary risk for cancer in the family. The cancers in Dana Kramer and/or her family may be due to other genetic or environmental factors. There may be a gene mutation in one of these genes that current testing methods cannot detect, but that chance is small. There could be another gene that has not yet been discovered, or that we have not yet tested, that is responsible for the cancer diagnoses in the family.  It is also possible there is a hereditary cause for the cancer in the family that Dana Kramer did not inherit.  Therefore, it is important to remain in touch with cancer genetics in the future so that we can continue to offer Dana Kramer the  most up to date genetic testing.    ADDITIONAL GENETIC TESTING:  We discussed with Dana Kramer that her genetic testing was fairly extensive.  If there are genes identified to increase cancer risk that can be analyzed in the future, we would be happy to discuss and coordinate this testing at that time.    CANCER SCREENING RECOMMENDATIONS:  Dana Kramer test result is considered negative (normal).  This means that we have not identified a hereditary cause for her personal and family history of cancer at this time.  An individual's cancer risk and medical management are not determined by genetic test results alone. Overall cancer risk assessment incorporates additional factors, including personal medical history, family history, and any available genetic information that may result in a personalized plan for cancer prevention and surveillance. Therefore, it is recommended she continue to follow the cancer management and screening guidelines provided by her oncology and primary healthcare provider.  RECOMMENDATIONS FOR FAMILY MEMBERS:   Since she did not inherit a mutation in a cancer predisposition gene included on this panel, her children could not have inherited a mutation from her in one of these genes. Individuals in this family might be at some increased risk of developing cancer, over the general population risk, due to the family history of cancer. We  recommend women in this family have a yearly mammogram beginning at age 85, or 41 years younger than the earliest onset of cancer, an annual clinical breast exam, and perform monthly breast self-exams.  FOLLOW-UP:  Cancer genetics is a rapidly advancing field and it is possible that new genetic tests will be appropriate for her and/or her family members in the future. We encouraged her to remain in contact with cancer genetics on an annual basis so we can update her personal and family histories and let her know of advances in cancer genetics that may  benefit this family.   Our contact number was provided. Dana Kramer questions were answered to her satisfaction, and she knows she is welcome to call us at anytime with additional questions or concerns.   [name], MS, [facility] Genetic Counselor [email or web address] (P) [phone]

## 2021-02-04 ENCOUNTER — Telehealth: Payer: Self-pay

## 2021-02-04 NOTE — Telephone Encounter (Signed)
Transition Care Management Follow-up Telephone Call Date of discharge and from where: 01/24/2021  [facility] How have you been since you were released from the hospital? Doing well Any questions or concerns? No  Items Reviewed: Did the pt receive and understand the discharge instructions provided? Yes  Medications obtained and verified? Yes  Other? No  Any new allergies since your discharge? No  Dietary orders reviewed? No Do you have support at home? Yes   Home Care and Equipment/Supplies: Were home health services ordered? not applicable If so, what is the name of the agency?  Has the agency set up a time to come to the patient's home? NA Were any new equipment or medical supplies ordered?  No What is the name of the medical supply agency?  Were you able to get the supplies/equipment?  Do you have any questions related to the use of the equipment or supplies?   Functional Questionnaire: (I = Independent and D = Dependent) ADLs: I  Bathing/Dressing- I  Meal Prep- I  Eating- I  Maintaining continence- I  Transferring/Ambulation- I  Managing Meds- I  Follow up appointments reviewed:  PCP Hospital f/u appt confirmed? No  Specialist Hospital f/u appt confirmed? Yes  Scheduled to see Surgeon and Oncology  Are transportation arrangements needed? No  If their condition worsens, is the pt aware to call PCP or go to the Emergency Dept.? yesYes Was the patient provided with contact information for the PCP's office or ED? Yes Was to pt encouraged to call back with questions or concerns? Yes  Tomasa Rand, RN, BSN, CEN [facility] [employer] [phone]

## 2021-02-05 ENCOUNTER — Other Ambulatory Visit (HOSPITAL_COMMUNITY): Payer: Managed Care, Other (non HMO)

## 2021-02-06 ENCOUNTER — Telehealth: Payer: Self-pay | Admitting: Hematology

## 2021-02-06 NOTE — Telephone Encounter (Signed)
Scheduled per sch msg. Called and spoke with patient. Confirmed new date and time  

## 2021-02-12 ENCOUNTER — Other Ambulatory Visit: Payer: Self-pay

## 2021-02-13 ENCOUNTER — Other Ambulatory Visit: Payer: Self-pay | Admitting: Hematology

## 2021-02-13 ENCOUNTER — Other Ambulatory Visit: Payer: Self-pay | Admitting: Family Medicine

## 2021-02-13 ENCOUNTER — Ambulatory Visit: Payer: Managed Care, Other (non HMO) | Admitting: Hematology

## 2021-02-13 ENCOUNTER — Other Ambulatory Visit: Payer: Managed Care, Other (non HMO)

## 2021-02-13 DIAGNOSIS — E039 Hypothyroidism, unspecified: Secondary | ICD-10-CM

## 2021-02-13 MED ORDER — ONDANSETRON HCL 8 MG PO TABS: 8.0000 mg | ORAL_TABLET | Freq: Two times a day (BID) | ORAL | 1 refills | Status: DC | PRN

## 2021-02-13 MED ORDER — PROCHLORPERAZINE MALEATE 10 MG PO TABS: 10.0000 mg | ORAL_TABLET | Freq: Four times a day (QID) | ORAL | 1 refills | Status: DC | PRN

## 2021-02-13 NOTE — Progress Notes (Signed)
START ON PATHWAY REGIMEN - Breast     Cycle 1: A cycle is 7 days:     Trastuzumab-xxxx      Paclitaxel    Cycles 2 through 12: A cycle is every 7 days:     Trastuzumab-xxxx      Paclitaxel    Cycles 13 through 25: A cycle is every 21 days:     Trastuzumab-xxxx   **Always confirm dose/schedule in your pharmacy ordering system**  Patient Characteristics: Postoperative without Neoadjuvant Therapy (Pathologic Staging), Invasive Disease, Adjuvant Therapy, HER2 Positive, ER Positive, Node Negative, pT1c, pN0/N1mi Therapeutic Status: Postoperative without Neoadjuvant Therapy (Pathologic Staging) AJCC Grade: G2 AJCC N Category: pN0 AJCC M Category: cM0 ER Status: Positive (+) AJCC 8 Stage Grouping: IA HER2 Status: Positive (+) Oncotype Dx Recurrence Score: Not Appropriate AJCC T Category: pT1c PR Status: Positive (+) Adjuvant Therapy Status: No Adjuvant Therapy Received Yet or Changing Initial Adjuvant Regimen due to Tolerance Intent of Therapy: Curative Intent, Discussed with Patient 

## 2021-02-14 ENCOUNTER — Inpatient Hospital Stay: Payer: Managed Care, Other (non HMO) | Attending: Hematology | Admitting: Hematology

## 2021-02-14 ENCOUNTER — Encounter: Payer: Self-pay | Admitting: *Deleted

## 2021-02-14 ENCOUNTER — Other Ambulatory Visit: Payer: Self-pay

## 2021-02-14 ENCOUNTER — Other Ambulatory Visit: Payer: Managed Care, Other (non HMO)

## 2021-02-14 ENCOUNTER — Encounter: Payer: Self-pay | Admitting: Hematology

## 2021-02-14 VITALS — BP 153/101 | HR 93 | Temp 98.1°F | Resp 16 | Ht 60.0 in | Wt 127.0 lb

## 2021-02-14 DIAGNOSIS — Z5111 Encounter for antineoplastic chemotherapy: Secondary | ICD-10-CM | POA: Diagnosis present

## 2021-02-14 DIAGNOSIS — Z79899 Other long term (current) drug therapy: Secondary | ICD-10-CM | POA: Diagnosis not present

## 2021-02-14 DIAGNOSIS — C50411 Malignant neoplasm of upper-outer quadrant of right female breast: Secondary | ICD-10-CM | POA: Insufficient documentation

## 2021-02-14 DIAGNOSIS — Z5112 Encounter for antineoplastic immunotherapy: Secondary | ICD-10-CM | POA: Diagnosis not present

## 2021-02-14 DIAGNOSIS — Z170 Estrogen receptor positive status [ER+]: Secondary | ICD-10-CM | POA: Diagnosis not present

## 2021-02-14 NOTE — Progress Notes (Signed)
[place]   Telephone:([phone]) [phone] Fax:(336) [phone]   Clinic Follow up Note   Patient Care Team: Copland, Gay Filler, MD as PCP - General (Family Medicine) Erroll Luna, MD as Consulting Physician (General Surgery) Truitt Merle, MD as Consulting Physician (Hematology) Eppie Gibson, MD as Attending Physician (Radiation Oncology) Mauro Kaufmann, RN as Oncology Nurse Navigator [name], RN as Oncology Nurse Navigator  Date of Service:  02/14/2021  CHIEF COMPLAINT: f/u of right breast cancer  CURRENT THERAPY:  To start weekly TC 02/24/21  ASSESSMENT & PLAN:  Dana Kramer is a 55 y.o. female with   1. Malignant neoplasm of upper-outer quadrant of right breast, Stage IA, c(T1bN0M0), Triple positive, Grade 2 -found on screening mammogram. B/L diagnostic MM and [place] 01/02/21 showed 0.7 cm upper-outer right breast mass with associated calcifications extending 1.5 cm. No abnormal right lymph nodes or left malignancy. 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 -she opted for b/l mastectomies on 01/23/21 with Dr. Brantley Stage. 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  -In light of her Her2+ disease, I recommend adjuvant chemotherapy with weekly Taxol for 12 weeks and trastuzumab for a year.  --Chemotherapy consent: Side effects including but does not not limited to, fatigue, nausea, vomiting, diarrhea, hair loss, neuropathy, fluid retention, renal and kidney dysfunction, neutropenic fever, needed for blood transfusion, bleeding, were discussed with patient in great detail. She agrees to proceed. -She is scheduled for chemo education on 12/13 and to begin chemo on 12/19. Echo has not yet been completed, we will request this to be scheduled before she begins treatment.     PLAN:  -echo next week -lab, flush, and weekly taxol/herceptin x6 starting 12/19 -f/u on week 1, 2, 4, and 6   No problem-specific Assessment & Plan  notes found for this encounter.   SUMMARY OF ONCOLOGIC HISTORY: Oncology History Overview Note   Cancer Staging  Malignant neoplasm of upper-outer quadrant of right breast in female, estrogen receptor positive ([place]) Staging form: Breast, AJCC 8th Edition - Clinical stage from 01/08/2021: Stage IA (cT1b, cN0, cM0, G2, ER+, PR+, HER2+) - Signed by Truitt Merle, MD on 01/15/2021 - Pathologic stage from 01/23/2021: Stage IA (pT1c, pN0, cM0, G2, ER+, PR+, HER2+) - Signed by Truitt Merle, MD on 02/13/2021     Malignant neoplasm of upper-outer quadrant of right breast in female, estrogen receptor positive ([place])  01/02/2021 Mammogram   EXAM: DIGITAL DIAGNOSTIC BILATERAL MAMMOGRAM WITH TOMOSYNTHESIS AND CAD; ULTRASOUND LEFT BREAST LIMITED; ULTRASOUND RIGHT BREAST LIMITED  IMPRESSION: 1. Highly suspicious 0.7 cm UPPER-OUTER RIGHT breast mass with associated suspicious calcifications extending 1.5 cm anteriorly. Tissue sampling is recommended. 2. No abnormal appearing RIGHT axillary lymph nodes. 3. Benign LOWER LEFT breast cyst corresponding to the LEFT breast screening study finding.   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  PROGNOSTIC INDICATORS Results: The tumor cells are 2+  for Her2 (EQUIVOCAL). Her2 by FISH will be performed and results reported separately. Estrogen Receptor: 100%, POSITIVE, STRONG  STAINING INTENSITY Progesterone Receptor: 70%, POSITIVE, STRONG STAINING INTENSITY Proliferation Marker Ki67: 10%  FLUORESCENCE IN-SITU HYBRIDIZATION Results: GROUP 1: HER2 **POSITIVE**   01/13/2021 Initial Diagnosis   Malignant neoplasm of upper-outer quadrant of right breast in female, estrogen receptor positive ([place])   01/15/2021 Genetic Testing   [facility] CustomNext Panel was Negative. Report date is 01/26/2021.  The CustomNext-Cancer+RNAinsight panel offered by Althia Forts includes sequencing and rearrangement analysis for the following 47 genes:  APC, ATM, AXIN2, BARD1, BMPR1A, BRCA1, BRCA2, BRIP1, CDH1, CDK4, CDKN2A, CHEK2, CTNNA1, DICER1, EPCAM, GREM1, HOXB13, KIT, MEN1, MLH1, MSH2, MSH3, MSH6, MUTYH, NBN, NF1, NTHL1, PALB2, PDGFRA, PMS2, POLD1, POLE, PTEN, RAD50, RAD51C, RAD51D, SDHA, SDHB, SDHC, SDHD, SMAD4, SMARCA4, STK11, TP53, TSC1, TSC2, and VHL.  RNA data is routinely analyzed for use in variant interpretation for all genes.   01/23/2021 Cancer Staging   Staging form: Breast, AJCC 8th Edition - Pathologic stage from 01/23/2021: Stage IA (pT1c, pN0, cM0, G2, ER+, PR+, HER2+) - Signed by Truitt Merle, MD on 02/13/2021 Histologic grading system: 3 grade system Residual tumor (R): R0 - None    01/23/2021 Definitive Surgery   FINAL MICROSCOPIC DIAGNOSIS:   A. BREAST, LEFT, MASTECTOMY:  - Fibrocystic changes with sclerosing adenosis and calcifications.  - Fibroadenoma (0.6 cm).  - No evidence of malignancy.   B. BREAST, RIGHT, MASTECTOMY:  - Invasive and in situ ductal carcinoma, 1.7 cm.  - Margins negative for carcinoma.  - Biopsy site and biopsy clip.  - See oncology table.   C. LYMPH NODE, RIGHT AXILLARY, SENTINEL, EXCISION:  - One lymph node negative for metastatic carcinoma (0/1).   D. LYMPH NODE, RIGHT AXILLARY, SENTINEL, EXCISION:  - One lymph node negative for metastatic carcinoma (0/1).   E. LYMPH NODE, RIGHT AXILLARY, SENTINEL, EXCISION:  - One lymph node  negative for metastatic carcinoma (0/1).   F. LYMPH NODE, RIGHT AXILLARY, SENTINEL, EXCISION:  - One lymph node negative for metastatic carcinoma (0/1).    02/25/2021 -  Chemotherapy   Patient is on Treatment Plan : BREAST Paclitaxel + Trastuzumab q7d / Trastuzumab q21d        INTERVAL HISTORY:  [facility] is here for a follow up of breast cancer. She was last seen by me on 01/15/21 in consultation. She presents to the clinic accompanied by her daughter Estill Bamberg.   All other systems were reviewed with the patient and are negative.  MEDICAL HISTORY:  Past Medical History:  Diagnosis Date   Allergy 11/1998   Penicillin   Chicken pox    History of transfusion of whole blood    Hyperlipidemia    Malignant neoplasm of upper-outer quadrant of right breast in female, estrogen receptor positive ([place]) 01/13/2021   Migraine    Thyroid disease    Hypothyroidism    SURGICAL HISTORY: Past Surgical History:  Procedure Laterality Date   ABDOMINOPLASTY  10/01/2010   CESAREAN SECTION     twice   HERNIA REPAIR     PORTACATH PLACEMENT Right 01/23/2021   Procedure: Right internal jugular 8 [place] PowerPort with C arm and ultrasound guidance;  Surgeon: Erroll Luna, MD;  Location: [facility];  Service: General;  Laterality: Right;   SENTINEL NODE BIOPSY Right 01/23/2021   Procedure: Right axillary sentinel lymph node mapping using Mag Trace;  Surgeon: Erroll Luna, MD;  Location: [facility];  Service: General;  Laterality: Right;   SIMPLE  MASTECTOMY WITH AXILLARY SENTINEL NODE BIOPSY Bilateral 01/23/2021   Procedure: Right simple mastectomy ;  Left risk reducing simple mastectomy;  Surgeon: Erroll Luna, MD;  Location: [facility];  Service: General;  Laterality: Bilateral;   TUBAL LIGATION     umblica hernia repair  97/67/3419    I have reviewed the social history and family history with the patient and they are unchanged from previous note.  ALLERGIES:  is allergic to penicillins.  MEDICATIONS:   Current Outpatient Medications  Medication Sig Dispense Refill   b complex vitamins capsule Take 1 capsule by mouth daily.     Calcium Citrate-Vitamin D (CALCIUM + D PO) Take 1 tablet by mouth daily.     Cholecalciferol (VITAMIN D PO) Take 1 tablet by mouth daily.     ibuprofen (ADVIL) 800 MG tablet Take 1 tablet (800 mg total) by mouth every 8 (eight) hours as needed. 30 tablet 0   Ibuprofen-diphenhydrAMINE HCl (ADVIL PM) 200-25 MG CAPS Take 2 tablets by mouth daily as needed (pain).     levothyroxine (SYNTHROID) 25 MCG tablet TAKE 1 TABLET(25 MCG) BY MOUTH DAILY BEFORE BREAKFAST 90 tablet 3   Melatonin 10 MG CAPS Take 10 mg by mouth at bedtime.     metoprolol succinate (TOPROL-XL) 25 MG 24 hr tablet Take 1 tablet (25 mg total) by mouth daily. Take with or immediately following a meal. 90 tablet 3   Multiple Vitamins-Minerals (WOMENS MULTIVITAMIN PO) Take 1 tablet by mouth daily.     omeprazole (PRILOSEC OTC) 20 MG tablet Take 20 mg by mouth daily.     ondansetron (ZOFRAN) 8 MG tablet Take 1 tablet (8 mg total) by mouth 2 (two) times daily as needed (Nausea or vomiting). 30 tablet 1   oxyCODONE (OXY IR/ROXICODONE) 5 MG immediate release tablet Take 1 tablet (5 mg total) by mouth every 6 (six) hours as needed for severe pain. 15 tablet 0   prochlorperazine (COMPAZINE) 10 MG tablet Take 1 tablet (10 mg total) by mouth every 6 (six) hours as needed (Nausea or vomiting). 30 tablet 1   simvastatin (ZOCOR) 20 MG tablet TAKE 1 TABLET(20 MG) BY MOUTH AT BEDTIME 90 tablet 3   No current facility-administered medications for this visit.    PHYSICAL EXAMINATION: ECOG PERFORMANCE STATUS: 1 - Symptomatic but completely ambulatory  Vitals:   02/14/21 1129  BP: (!) 153/101  Pulse: 93  Resp: 16  Temp: 98.1 F (36.7 C)  SpO2: 100%   Wt Readings from Last 3 Encounters:  02/14/21 127 lb (57.6 kg)  01/23/21 125 lb (56.7 kg)  01/22/21 125 lb (56.7 kg)     GENERAL:alert, no distress and  comfortable SKIN: skin color normal, no rashes or significant lesions EYES: normal, Conjunctiva are pink and non-injected, sclera clear  NEURO: alert & oriented x 3 with fluent speech BREAST: s/p bilateral mastectomy.  Incisions are healing well, no discharge or skin erythema.  Previous draining sites are healing well also.  No palpable mass, nodules or adenopathy bilaterally.   LABORATORY DATA:  I have reviewed the data as listed CBC Latest Ref Rng & Units 01/15/2021 11/06/2020 11/02/2019  WBC 4.0 - 10.5 K/uL 5.8 5.7 5.7  Hemoglobin 12.0 - 15.0 g/dL 11.8(L) 12.4 13.5  Hematocrit 36.0 - 46.0 % 36.4 36.8 41.1  Platelets 150 - 400 K/uL 194 201.0 218     CMP Latest Ref Rng & Units 01/15/2021 11/06/2020 11/02/2019  Glucose 70 - 99 mg/dL 105(H) 77 91  BUN 6 -  20 mg/dL 11 7 13   Creatinine 0.44 - 1.00 mg/dL 0.83 0.77 0.83  Sodium 135 - 145 mmol/L 142 138 139  Potassium 3.5 - 5.1 mmol/L 3.5 4.4 4.5  Chloride 98 - 111 mmol/L 108 102 102  CO2 22 - 32 mmol/L 24 30 25   Calcium 8.9 - 10.3 mg/dL 9.2 9.7 9.4  Total Protein 6.5 - 8.1 g/dL 7.3 6.9 6.9  Total Bilirubin 0.3 - 1.2 mg/dL 0.3 0.7 0.7  Alkaline Phos 38 - 126 U/L 59 54 -  AST 15 - 41 U/L 19 18 19   ALT 0 - 44 U/L 9 9 8       RADIOGRAPHIC STUDIES: I have personally reviewed the radiological images as listed and agreed with the findings in the report. No results found.    Orders Placed This Encounter  Procedures   CBC with Differential ([place] Only)    Standing Status:   Standing    Number of Occurrences:   20    Standing Expiration Date:   02/13/2022   CMP ([place] only)    Standing Status:   Standing    Number of Occurrences:   20    Standing Expiration Date:   02/13/2022   All questions were answered. The patient knows to call the clinic with any problems, questions or concerns. No barriers to learning was detected. The total time spent in the appointment was 40 minutes.     Truitt Merle, MD 02/14/2021   I, Wilburn Mylar, am acting as scribe for Truitt Merle, MD.   I have reviewed the above documentation for accuracy and completeness, and I agree with the above.

## 2021-02-18 ENCOUNTER — Other Ambulatory Visit: Payer: Self-pay

## 2021-02-18 ENCOUNTER — Other Ambulatory Visit: Payer: Self-pay | Admitting: Hematology

## 2021-02-18 ENCOUNTER — Inpatient Hospital Stay: Payer: Managed Care, Other (non HMO)

## 2021-02-18 MED ORDER — LIDOCAINE-PRILOCAINE 2.5-2.5 % EX CREA: 1.0000 "application " | TOPICAL_CREAM | CUTANEOUS | 2 refills | Status: DC | PRN

## 2021-02-18 NOTE — Progress Notes (Signed)
Pharmacist Chemotherapy Monitoring - Initial Assessment    Anticipated start date: 02/25/21   The following has been reviewed per standard work regarding the patient's treatment regimen: The patient's diagnosis, treatment plan and drug doses, and organ/hematologic function Lab orders and baseline tests specific to treatment regimen  The treatment plan start date, drug sequencing, and pre-medications Prior authorization status  Patient's documented medication list, including drug-drug interaction screen and prescriptions for anti-emetics and supportive care specific to the treatment regimen The drug concentrations, fluid compatibility, administration routes, and timing of the medications to be used The patient's access for treatment and lifetime cumulative dose history, if applicable  The patient's medication allergies and previous infusion related reactions, if applicable   Changes made to treatment plan:  drug sequencing  Follow up needed:  signing treatment plan.  Therapeutic class duplication.  Message sent to Dr. Burr Medico to review.    Kennith Center, Pharm.D., CPP 02/18/2021@12 :04 PM

## 2021-02-19 ENCOUNTER — Encounter: Payer: Self-pay | Admitting: *Deleted

## 2021-02-20 ENCOUNTER — Ambulatory Visit: Payer: Managed Care, Other (non HMO) | Attending: Surgery | Admitting: Physical Therapy

## 2021-02-20 ENCOUNTER — Other Ambulatory Visit: Payer: Self-pay

## 2021-02-20 ENCOUNTER — Encounter: Payer: Self-pay | Admitting: Physical Therapy

## 2021-02-20 ENCOUNTER — Telehealth: Payer: Self-pay | Admitting: Hematology

## 2021-02-20 DIAGNOSIS — M25612 Stiffness of left shoulder, not elsewhere classified: Secondary | ICD-10-CM | POA: Diagnosis present

## 2021-02-20 DIAGNOSIS — Z483 Aftercare following surgery for neoplasm: Secondary | ICD-10-CM

## 2021-02-20 DIAGNOSIS — R293 Abnormal posture: Secondary | ICD-10-CM

## 2021-02-20 DIAGNOSIS — R600 Localized edema: Secondary | ICD-10-CM

## 2021-02-20 DIAGNOSIS — M25611 Stiffness of right shoulder, not elsewhere classified: Secondary | ICD-10-CM

## 2021-02-20 DIAGNOSIS — Z170 Estrogen receptor positive status [ER+]: Secondary | ICD-10-CM | POA: Insufficient documentation

## 2021-02-20 DIAGNOSIS — C50411 Malignant neoplasm of upper-outer quadrant of right female breast: Secondary | ICD-10-CM | POA: Diagnosis not present

## 2021-02-20 NOTE — Patient Instructions (Addendum)
° ° °   [facility]  [address]  [place] [address]  [phone]  After Breast Cancer Class It is recommended you attend the ABC class to be educated on lymphedema risk reduction. This class is free of charge and lasts for 1 hour. It is a 1-time class. You will need to download the Webex app either on your phone or computer. We will send you a link the night before or the morning of the class. You should be able to click on that link to join the class. This is not a confidential class. You don't have to turn your camera on, but other participants may be able to see your email address. You are scheduled for December 19th at 29.  Scar massage You can begin gentle scar massage to you incision sites. Gently place one hand on the incision and move the skin (without sliding on the skin) in various directions. Do this for a few minutes and then you can gently massage either coconut oil or vitamin E cream into the scars. You are not yet ready for this - incisions need to finish healing first.  Compression garment You should continue wearing your compression bra until you feel like you no longer have swelling.  Home exercise Program Continue doing the exercises you were given until you feel like you can do them without feeling any tightness at the end.   Walking Program Studies show that 30 minutes of walking per day (fast enough to elevate your heart rate) can significantly reduce the risk of a cancer recurrence. If you can't walk due to other medical reasons, we encourage you to find another activity you could do (like a stationary bike or water exercise).  Posture After breast cancer surgery, people frequently sit with rounded shoulders posture because it puts their incisions on slack and feels better. If you sit like this and scar tissue forms in that position, you can become very tight and have pain sitting or standing with good posture. Try to be aware of your  posture and sit and stand up tall to heal properly.  Follow up PT: It is recommended you return every 3 months for the first 3 years following surgery to be assessed on the SOZO machine for an L-Dex score. This helps prevent clinically significant lymphedema in 95% of patients. These follow up screens are 10 minute appointments that you are not billed for.

## 2021-02-20 NOTE — Therapy (Signed)
[facility] [place], [place], [address] Phone: [phone]   Fax:  [phone]  Physical Therapy Treatment  Patient Details  Name: Dana Kramer MRN: [id number] Date of Birth: 1965/05/18 Referring Provider (PT): Dr. Erroll Luna   Encounter Date: 02/20/2021   PT End of Session - 02/20/21 1404     Visit Number 2    Number of Visits 10    Date for PT Re-Evaluation 03/20/21    PT Start Time 5701    PT Stop Time 1108    PT Time Calculation (min) 53 min    Activity Tolerance Patient tolerated treatment well    Behavior During Therapy [facility] for tasks assessed/performed             Past Medical History:  Diagnosis Date   Allergy 11/1998   Penicillin   Chicken pox    History of transfusion of whole blood    Hyperlipidemia    Malignant neoplasm of upper-outer quadrant of right breast in female, estrogen receptor positive ([place]) 01/13/2021   Migraine    Thyroid disease    Hypothyroidism    Past Surgical History:  Procedure Laterality Date   ABDOMINOPLASTY  10/01/2010   CESAREAN SECTION     twice   HERNIA REPAIR     PORTACATH PLACEMENT Right 01/23/2021   Procedure: Right internal jugular 8 [place] PowerPort with C arm and ultrasound guidance;  Surgeon: Erroll Luna, MD;  Location: [facility];  Service: General;  Laterality: Right;   SENTINEL NODE BIOPSY Right 01/23/2021   Procedure: Right axillary sentinel lymph node mapping using Mag Trace;  Surgeon: Erroll Luna, MD;  Location: [facility];  Service: General;  Laterality: Right;   SIMPLE MASTECTOMY WITH AXILLARY SENTINEL NODE BIOPSY Bilateral 01/23/2021   Procedure: Right simple mastectomy ;  Left risk reducing simple mastectomy;  Surgeon: Erroll Luna, MD;  Location: [facility];  Service: General;  Laterality: Bilateral;   TUBAL LIGATION     umblica hernia repair  77/93/9030    There were no vitals filed for this visit.   Subjective Assessment - 02/20/21  1019     Subjective Patient reports she underwent a bilateral mastectomy with a right sentinel node biopsy (4 negative nodes) on 01/23/2021. She will begin chemotherapy on 02/25/2021 followed by anti-estrogen therapy. Drains were removed on 02/10/2021. No reconstruction planned.    Pertinent History Patient was diagnosed on 12/10/2020 with right grade II invasive ductal carcinoma breast cancer. She underwent a bilateral mastectomy with a right sentinel node biopsy (4 negative nodes) on 01/23/2021. It is ER/PR positive and HER2 negative with a Ki67 of 10%.    Patient Stated Goals Regain shoulder function    Currently in Pain? Yes    Pain Score 4     Pain Location Axilla    Pain Orientation Right    Pain Descriptors / Indicators Tightness    Pain Type Surgical pain    Pain Onset 1 to 4 weeks ago    Pain Frequency Intermittent    Aggravating Factors  Reaching up    Pain Relieving Factors Rest                [facility] PT Assessment - 02/20/21 0001       Assessment   Medical Diagnosis s/p bil mastectomy and right SLNB    Referring Provider (PT) Dr. Marcello Moores [place]    Onset Date/Surgical Date 01/23/21    Hand Dominance Right    Prior  Therapy Baselines      Precautions   Precautions Other (comment)    Precaution Comments recent surgery and right arm lymphedema risk      Restrictions   Weight Bearing Restrictions No      Balance Screen   Has the patient fallen in the past 6 months No    Has the patient had a decrease in activity level because of a fear of falling?  No    Is the patient reluctant to leave their home because of a fear of falling?  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 Significant edema present left chest into her axilla. Applied compression foam and bilateral knitted knockers to assist with swelling.      Posture/Postural Control   Posture/Postural Control Postural limitations    Postural Limitations Rounded Shoulders;Forward head      ROM / Strength   AROM / PROM / Strength AROM      AROM   AROM Assessment Site Shoulder    Right/Left Shoulder Right;Left    Right Shoulder Extension 46 Degrees    Right Shoulder Flexion 110 Degrees    Right Shoulder ABduction 110 Degrees    Right Shoulder Internal Rotation 54 Degrees    Right Shoulder External Rotation 83 Degrees    Left Shoulder Extension 33 Degrees    Left Shoulder Flexion 67 Degrees    Left Shoulder ABduction 43 Degrees               LYMPHEDEMA/ONCOLOGY QUESTIONNAIRE - 02/20/21 0001       Type   Cancer Type Right breast cancer      Surgeries   Mastectomy Date 01/23/21    Sentinel Lymph Node Biopsy Date 01/23/21    Number Lymph Nodes Removed 4      Treatment   Active Chemotherapy Treatment No    Past Chemotherapy Treatment No    Active Radiation Treatment No    Past Radiation Treatment No    Current Hormone Treatment No    Past Hormone Therapy No      What other symptoms do you have   Are you Having Heaviness or Tightness Yes    Are you having Pain Yes    Are you having pitting edema No    Is it Hard or Difficult finding clothes that fit No    Do you have infections No    Is there Decreased scar mobility Yes    Stemmer Sign No      Lymphedema Assessments   Lymphedema Assessments Upper extremities      Right Upper Extremity Lymphedema   10 cm Proximal to Olecranon Process 26.3 cm    Olecranon Process 23 cm    10 cm Proximal to  Ulnar Styloid Process 21.5 cm    Just Proximal to Ulnar Styloid Process 14.2 cm    Across Hand at [employer]  18.5 cm    At [facility] of 2nd Digit 5.6 cm      Left Upper Extremity Lymphedema   10 cm Proximal to Olecranon Process 27.9 cm    Olecranon Process 24.3 cm    10 cm Proximal to Ulnar Styloid Process 22.3 cm    Just Proximal to Ulnar Styloid Process 14.3 cm    Across Hand at [employer] 18.2 cm    At [place] of 2nd Digit 5.6 cm                [name] - 02/20/21 0001     Open a tight or new jar Mild difficulty    Do heavy household chores (wash walls, wash floors) Moderate difficulty    Carry a shopping bag or briefcase Mild difficulty    Wash your back Mild difficulty    Use a knife to cut food No difficulty    Recreational activities in which you take some force or impact through your arm, shoulder, or hand (golf, hammering, tennis) Unable    During the past week, to what extent has your arm, shoulder or hand problem interfered with your normal social activities with family, friends, neighbors, or groups? Slightly    During the past week, to what extent has your arm, shoulder or hand problem limited your work or other regular daily activities Slightly    Arm, shoulder, or hand pain. Mild    Tingling (pins and needles) in your arm, shoulder, or hand None    Difficulty Sleeping Mild difficulty    DASH Score 29.55 %                             PT Education - 02/20/21 1404     Education Details Aftercare; lymphedema education; HEP    Person(s) Educated Patient;Child(ren)    Methods Demonstration;Explanation;Handout    Comprehension Returned demonstration;Verbalized understanding                 PT Long Term Goals - 02/20/21 1409       PT LONG TERM GOAL #1   Title Patient will demonstrate she has regained full shoulder ROM and function post operatively compared to baselines.    Time 4    Period Weeks    Status On-going    Target  Date 03/20/21      PT LONG TERM GOAL #2   Title Patient will increase bilateral shoulder flexion to >/= 150 degrees for increased ease reaching.    Time 4    Period Weeks    Status New    Target Date 03/20/21      PT LONG TERM GOAL #3   Title Patient will increase bil shoulder abduction to >/= 160 degrees for increased ease with normal daily tasks and work tasks.    Time 4    Period Weeks    Status New    Target Date 03/20/21      PT LONG TERM GOAL #4   Title Patient will be independent with her HEP for shoulder ROM.    Time 4    Period Weeks    Status New    Target Date 03/20/21      PT LONG TERM GOAL #5   Title Patient will verbalize good understanding of lymphedema risk reduction practices after attending the After Breast Cancer Class.    Time 4    Period Weeks    Status New  Target Date 03/20/21                   Plan - 02/20/21 1404     Clinical Impression Statement Patient is healing well s/p bil mastectomy and right sentinel node biopsy (4 negative nodes) on 01/23/2021. She begins chemotherapy next week for HER2 + breast cancer. She is very limited with bil shoulder ROM and function and has significant edema in her left chest. She will benefit from PT to regain shoulder function. Edema may dissipate with use of compression foam and when she begins using her arms more but she may require manual lymph drainage.    PT Frequency 2x / week    PT Duration 4 weeks    PT Treatment/Interventions ADLs/Self Care Home Management;Therapeutic exercise;Patient/family education;Manual techniques;Manual lymph drainage;Passive range of motion;Scar mobilization;Therapeutic activities    PT Next Visit Plan PROM bil shoulders and AAROM exercises - within pt tolerance (will be limited by pain and is fearful of moving her arms)    PT Home Exercise Plan Post op HEP    Consulted and Agree with Plan of Care Patient;Family member/caregiver    Family Member Consulted daughter              Patient will benefit from skilled therapeutic intervention in order to improve the following deficits and impairments:  Postural dysfunction, Decreased range of motion, Decreased knowledge of precautions, Impaired UE functional use, Pain, Increased edema, Decreased scar mobility, Increased fascial restricitons  Visit Diagnosis: Malignant neoplasm of upper-outer quadrant of right breast in female, estrogen receptor positive ([place]) - Plan: PT plan of care cert/re-cert  Abnormal posture - Plan: PT plan of care cert/re-cert  Stiffness of left shoulder, not elsewhere classified - Plan: PT plan of care cert/re-cert  Stiffness of right shoulder, not elsewhere classified - Plan: PT plan of care cert/re-cert  Localized edema - Plan: PT plan of care cert/re-cert  Aftercare following surgery for neoplasm - Plan: PT plan of care cert/re-cert     Problem List Patient Active Problem List   Diagnosis Date Noted   Genetic testing 01/27/2021   S/P mastectomy, bilateral 01/23/2021   Family history of colon cancer 01/15/2021   Malignant neoplasm of upper-outer quadrant of right breast in female, estrogen receptor positive ([place]) 01/13/2021   Pre-diabetes 11/06/2020   Acquired hypothyroidism 04/14/2018   Migraine 01/18/2015   Insomnia 11/05/2011   Anxiety 11/05/2011   Hyperlipidemia 11/05/2011   Tachycardia 39/68/8648   Umbilical hernia 47/20/7218    Annia Friendly, PT 02/20/21 2:13 PM   [facility] [place], [place], [address] Phone: [phone]   Fax:  [phone]  Name: Dana Kramer MRN: [id number] Date of Birth: 09-04-1965

## 2021-02-20 NOTE — Telephone Encounter (Signed)
Left message with follow-up appointments per 12/9 los.

## 2021-02-23 NOTE — Progress Notes (Signed)
[place]   [phone] [phone]   Clinic Follow up Note   Patient Care Team: Copland, Gay Filler, MD as PCP - General (Family Medicine) Erroll Luna, MD as Consulting Physician (General Surgery) Truitt Merle, MD as Consulting Physician (Hematology) Eppie Gibson, MD as Attending Physician (Radiation Oncology) Mauro Kaufmann, RN as Oncology Nurse Navigator [name], RN as Oncology Nurse Navigator 02/25/2021  CHIEF COMPLAINT: Follow up right breast cancer   SUMMARY OF ONCOLOGIC HISTORY: Oncology History Overview Note   Cancer Staging  Malignant neoplasm of upper-outer quadrant of right breast in female, estrogen receptor positive ([place]) Staging form: Breast, AJCC 8th Edition - Clinical stage from 01/08/2021: Stage IA (cT1b, cN0, cM0, G2, ER+, PR+, HER2+) - Signed by Truitt Merle, MD on 01/15/2021 - Pathologic stage from 01/23/2021: Stage IA (pT1c, pN0, cM0, G2, ER+, PR+, HER2+) - Signed by Truitt Merle, MD on 02/13/2021     Malignant neoplasm of upper-outer quadrant of right breast in female, estrogen receptor positive ([place])  01/02/2021 Mammogram   EXAM: DIGITAL DIAGNOSTIC BILATERAL MAMMOGRAM WITH TOMOSYNTHESIS AND CAD; ULTRASOUND LEFT BREAST LIMITED; ULTRASOUND RIGHT BREAST LIMITED  IMPRESSION: 1. Highly suspicious 0.7 cm UPPER-OUTER RIGHT breast mass with associated suspicious calcifications extending 1.5 cm anteriorly. Tissue sampling is recommended. 2. No abnormal appearing RIGHT axillary lymph nodes. 3. Benign LOWER LEFT breast cyst corresponding to the LEFT breast screening study finding.   01/08/2021 Cancer Staging   Staging form: Breast, AJCC 8th Edition - Clinical stage from 01/08/2021: Stage IA (cT1b, cN0, cM0, G2, ER+, PR+, HER2+) - Signed by Truitt Merle, MD on 01/15/2021 Stage prefix: Initial diagnosis Nuclear grade: G2 Histologic grading system: 3 grade system    01/08/2021 Pathology Results   Diagnosis Breast, right, needle  core biopsy, upper outer quadrant, 11 o'clock, 5cmfn, ribbon clip - INVASIVE DUCTAL CARCINOMA - DUCTAL CARCINOMA IN SITU WITH CALCIFICATIONS - SEE COMMENT Microscopic Comment Based on the biopsy, the carcinoma appears Nottingham grade 2 of 3 and measures 0.6 cm in greatest linear extent.  PROGNOSTIC INDICATORS Results: The tumor cells are 2+ for Her2 (EQUIVOCAL). Her2 by FISH will be performed and results reported separately. Estrogen Receptor: 100%, POSITIVE, STRONG STAINING INTENSITY Progesterone Receptor: 70%, POSITIVE, STRONG STAINING INTENSITY Proliferation Marker Ki67: 10%  FLUORESCENCE IN-SITU HYBRIDIZATION Results: GROUP 1: HER2 **POSITIVE**   01/13/2021 Initial Diagnosis   Malignant neoplasm of upper-outer quadrant of right breast in female, estrogen receptor positive ([place])   01/15/2021 Genetic Testing   [facility] CustomNext Panel was Negative. Report date is 01/26/2021.  The CustomNext-Cancer+RNAinsight panel offered by [facility] includes sequencing and rearrangement analysis for the following 47 genes:  APC, ATM, AXIN2, BARD1, BMPR1A, BRCA1, BRCA2, BRIP1, CDH1, CDK4, CDKN2A, CHEK2, CTNNA1, DICER1, EPCAM, GREM1, HOXB13, KIT, MEN1, MLH1, MSH2, MSH3, MSH6, MUTYH, NBN, NF1, NTHL1, PALB2, PDGFRA, PMS2, POLD1, POLE, PTEN, RAD50, RAD51C, RAD51D, SDHA, SDHB, SDHC, SDHD, SMAD4, SMARCA4, STK11, TP53, TSC1, TSC2, and VHL.  RNA data is routinely analyzed for use in variant interpretation for all genes.   01/23/2021 Cancer Staging   Staging form: Breast, AJCC 8th Edition - Pathologic stage from 01/23/2021: Stage IA (pT1c, pN0, cM0, G2, ER+, PR+, HER2+) - Signed by Truitt Merle, MD on 02/13/2021 Histologic grading system: 3 grade system Residual tumor (R): R0 - None    01/23/2021 Definitive Surgery   FINAL MICROSCOPIC DIAGNOSIS:   A. BREAST, LEFT, MASTECTOMY:  - Fibrocystic changes with sclerosing adenosis and calcifications.  - Fibroadenoma (0.6 cm).  - No evidence of  malignancy.    B. BREAST, RIGHT, MASTECTOMY:  - Invasive and in situ ductal carcinoma, 1.7 cm.  - Margins negative for carcinoma.  - Biopsy site and biopsy clip.  - See oncology table.   C. LYMPH NODE, RIGHT AXILLARY, SENTINEL, EXCISION:  - One lymph node negative for metastatic carcinoma (0/1).   D. LYMPH NODE, RIGHT AXILLARY, SENTINEL, EXCISION:  - One lymph node negative for metastatic carcinoma (0/1).   E. LYMPH NODE, RIGHT AXILLARY, SENTINEL, EXCISION:  - One lymph node negative for metastatic carcinoma (0/1).   F. LYMPH NODE, RIGHT AXILLARY, SENTINEL, EXCISION:  - One lymph node negative for metastatic carcinoma (0/1).    02/25/2021 -  Chemotherapy   Patient is on Treatment Plan : BREAST Paclitaxel + Trastuzumab q7d / Trastuzumab q21d       CURRENT THERAPY: PENDING adjuvant chemotherapy paclitaxel/trastuzumab weekly x12, followed by maintenance trastuzumab q3 weeks to complete 1 year  INTERVAL HISTORY: Ms. Mosco returns for follow up and treatment as scheduled. Last seen 02/14/21.  She has recovered well from surgery, still with some pectoral tightness and limited range of motion in the left shoulder.  Incisions closed, denies pain.  Using Tylenol as needed.  Denies fever or chills since surgery.  She attended chemo class and had echo done prior to surgery.  She is anxious about chemo.  She took 50 mg of metoprolol this morning rather than her normal 25 mg.  She has never been on medication for hypertension.  Denies baseline neuropathy.   MEDICAL HISTORY:  Past Medical History:  Diagnosis Date   Allergy 11/1998   Penicillin   Chicken pox    History of transfusion of whole blood    Hyperlipidemia    Malignant neoplasm of upper-outer quadrant of right breast in female, estrogen receptor positive ([place]) 01/13/2021   Migraine    Thyroid disease    Hypothyroidism    SURGICAL HISTORY: Past Surgical History:  Procedure Laterality Date   ABDOMINOPLASTY  10/01/2010   CESAREAN SECTION      twice   HERNIA REPAIR     PORTACATH PLACEMENT Right 01/23/2021   Procedure: Right internal jugular 8 [place] PowerPort with C arm and ultrasound guidance;  Surgeon: Erroll Luna, MD;  Location: [place];  Service: General;  Laterality: Right;   SENTINEL NODE BIOPSY Right 01/23/2021   Procedure: Right axillary sentinel lymph node mapping using Mag Trace;  Surgeon: Erroll Luna, MD;  Location: [place];  Service: General;  Laterality: Right;   SIMPLE MASTECTOMY WITH AXILLARY SENTINEL NODE BIOPSY Bilateral 01/23/2021   Procedure: Right simple mastectomy ;  Left risk reducing simple mastectomy;  Surgeon: Erroll Luna, MD;  Location: [place];  Service: General;  Laterality: Bilateral;   TUBAL LIGATION     umblica hernia repair  91/79/1505    I have reviewed the social history and family history with the patient and they are unchanged from previous note.  ALLERGIES:  is allergic to penicillins.  MEDICATIONS:  Current Outpatient Medications  Medication Sig Dispense Refill   b complex vitamins capsule Take 1 capsule by mouth daily.     Calcium Citrate-Vitamin D (CALCIUM + D PO) Take 1 tablet by mouth daily.     Cholecalciferol (VITAMIN D PO) Take 1 tablet by mouth daily.     ibuprofen (ADVIL) 800 MG tablet Take 1 tablet (800 mg total) by mouth every 8 (eight) hours as needed. 30 tablet 0   Ibuprofen-diphenhydrAMINE HCl (ADVIL PM) 200-25 MG CAPS Take 2 tablets  by mouth daily as needed (pain).     levothyroxine (SYNTHROID) 25 MCG tablet TAKE 1 TABLET(25 MCG) BY MOUTH DAILY BEFORE BREAKFAST 90 tablet 3   lidocaine-prilocaine (EMLA) cream Apply 1 application topically as needed (To be applied to portacath 30 to 60 mins prior to portacath access). 30 g 2   Melatonin 10 MG CAPS Take 10 mg by mouth at bedtime.     metoprolol succinate (TOPROL-XL) 25 MG 24 hr tablet Take 1 tablet (25 mg total) by mouth daily. Take with or immediately following a meal. 90 tablet 3   Multiple Vitamins-Minerals (WOMENS  MULTIVITAMIN PO) Take 1 tablet by mouth daily.     omeprazole (PRILOSEC OTC) 20 MG tablet Take 20 mg by mouth daily.     ondansetron (ZOFRAN) 8 MG tablet Take 1 tablet (8 mg total) by mouth 2 (two) times daily as needed (Nausea or vomiting). 30 tablet 1   oxyCODONE (OXY IR/ROXICODONE) 5 MG immediate release tablet Take 1 tablet (5 mg total) by mouth every 6 (six) hours as needed for severe pain. 15 tablet 0   prochlorperazine (COMPAZINE) 10 MG tablet Take 1 tablet (10 mg total) by mouth every 6 (six) hours as needed (Nausea or vomiting). 30 tablet 1   simvastatin (ZOCOR) 20 MG tablet TAKE 1 TABLET(20 MG) BY MOUTH AT BEDTIME 90 tablet 3   No current facility-administered medications for this visit.    PHYSICAL EXAMINATION: ECOG PERFORMANCE STATUS: 1 - Symptomatic but completely ambulatory  Vitals:   02/25/21 0842  BP: (!) 151/108  Pulse: (!) 102  Resp: 20  Temp: (!) 97.3 F (36.3 C)  SpO2: 100%   Filed Weights   02/25/21 0842  Weight: 124 lb 12.8 oz (56.6 kg)    GENERAL:alert, no distress and comfortable SKIN: Without rash EYES: sclera clear LUNGS: clear with normal breathing effort HEART: regular rate & rhythm, no lower extremity edema Musculoskeletal: Limited ROM left shoulder NEURO: alert & oriented x 3 with fluent speech, no focal motor/sensory deficits PAC site with mild edema, no erythema. Incision closed Breast exam s/p bilateral mastectomies, incisions closed healing well without erythema or drainage    LABORATORY DATA:  I have reviewed the data as listed CBC Latest Ref Rng & Units 02/25/2021 01/15/2021 11/06/2020  WBC 4.0 - 10.5 K/uL 6.0 5.8 5.7  Hemoglobin 12.0 - 15.0 g/dL 12.0 11.8(L) 12.4  Hematocrit 36.0 - 46.0 % 35.4(L) 36.4 36.8  Platelets 150 - 400 K/uL 289 194 201.0     CMP Latest Ref Rng & Units 02/25/2021 01/15/2021 11/06/2020  Glucose 70 - 99 mg/dL 137(H) 105(H) 77  BUN 6 - 20 mg/dL 13 11 7   Creatinine 0.44 - 1.00 mg/dL 0.76 0.83 0.77  Sodium 135 -  145 mmol/L 141 142 138  Potassium 3.5 - 5.1 mmol/L 3.7 3.5 4.4  Chloride 98 - 111 mmol/L 104 108 102  CO2 22 - 32 mmol/L 26 24 30   Calcium 8.9 - 10.3 mg/dL 10.1 9.2 9.7  Total Protein 6.5 - 8.1 g/dL 7.5 7.3 6.9  Total Bilirubin 0.3 - 1.2 mg/dL 0.4 0.3 0.7  Alkaline Phos 38 - 126 U/L 65 59 54  AST 15 - 41 U/L 16 19 18   ALT 0 - 44 U/L 7 9 9       RADIOGRAPHIC STUDIES: I have personally reviewed the radiological images as listed and agreed with the findings in the report. No results found.   ASSESSMENT & PLAN: Marishka Rentfrow is a 55 y.o. female with  1. Malignant neoplasm of upper-outer quadrant of right breast, Stage IA, c(T1bN0M0), Triple positive, Grade 2 -screening detected 0.7 cm upper-outer right breast mass with associated calcifications extending 1.5 cm. No abnormal right lymph nodes or left malignancy. 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 -S/p b/l mastectomies on 01/23/21 with Dr. Brantley Stage. 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  Port was placed during surgery -Due to Her2+ disease adjuvant chemotherapy was recommended, she will proceed with weekly Abraxane/Herceptin x12 weeks followed by maintenance Herceptin every 3 weeks to total a year.  -Treatment plan includes adjuvant antiestrogen therapy to follow.  She would not need postmastectomy radiation -Ms. Lundin appears stable, she has recovered well from bilateral mastectomies.  Incisions closed and healing well.  She has pectoral tightness and limited range of motion in the left shoulder, I recommend PT. -Labs reviewed. Baseline EF 60-65% with impaired relaxation (grade I diastolic dysfunction). Will monitor q3 months on herceptin -We discussed the potential side effects, symptom management, and curative goal of adjuvant chemo with Abraxane/Herceptin.  We reviewed possibility she may near She is ready to proceed. -Follow-up next week with cycle 2  2.  Elevated blood pressure  and anxiety -She has been very anxious since her diagnosis, BP elevated in clinic last few visits -no previous HTN diagnosis or treatment.  She is on metoprolol for tachycardia. -She will check BP at home and let us know if it remains elevated   PLAN: -Labs, echo reviewed -Proceed with cycle 1 weekly abraxane/herceptin -Cryotherapy with abraxane -Reviewed symptom management  -Check BP at home -F/up next week with cycle 2   All questions were answered. The patient knows to call the clinic with any problems, questions or concerns. No barriers to learning was detected.     Alla Feeling, NP 02/25/21

## 2021-02-24 ENCOUNTER — Encounter: Payer: Self-pay | Admitting: *Deleted

## 2021-02-25 ENCOUNTER — Encounter: Payer: Self-pay | Admitting: Nurse Practitioner

## 2021-02-25 ENCOUNTER — Encounter: Payer: Self-pay | Admitting: Family Medicine

## 2021-02-25 ENCOUNTER — Inpatient Hospital Stay (HOSPITAL_BASED_OUTPATIENT_CLINIC_OR_DEPARTMENT_OTHER): Payer: Managed Care, Other (non HMO)

## 2021-02-25 ENCOUNTER — Other Ambulatory Visit: Payer: Self-pay

## 2021-02-25 ENCOUNTER — Inpatient Hospital Stay: Payer: Managed Care, Other (non HMO) | Admitting: Nurse Practitioner

## 2021-02-25 ENCOUNTER — Inpatient Hospital Stay: Payer: Managed Care, Other (non HMO)

## 2021-02-25 VITALS — BP 151/108 | HR 102 | Temp 97.3°F | Resp 20 | Wt 124.8 lb

## 2021-02-25 VITALS — BP 138/90 | HR 92

## 2021-02-25 DIAGNOSIS — C50411 Malignant neoplasm of upper-outer quadrant of right female breast: Secondary | ICD-10-CM

## 2021-02-25 DIAGNOSIS — Z5112 Encounter for antineoplastic immunotherapy: Secondary | ICD-10-CM | POA: Diagnosis not present

## 2021-02-25 DIAGNOSIS — Z170 Estrogen receptor positive status [ER+]: Secondary | ICD-10-CM | POA: Diagnosis not present

## 2021-02-25 DIAGNOSIS — R000 Tachycardia, unspecified: Secondary | ICD-10-CM

## 2021-02-25 DIAGNOSIS — I10 Essential (primary) hypertension: Secondary | ICD-10-CM

## 2021-02-25 DIAGNOSIS — Z95828 Presence of other vascular implants and grafts: Secondary | ICD-10-CM

## 2021-02-25 LAB — CMP (CANCER CENTER ONLY)
ALT: 7 U/L (ref 0–44)
AST: 16 U/L (ref 15–41)
Albumin: 4.4 g/dL (ref 3.5–5.0)
Alkaline Phosphatase: 65 U/L (ref 38–126)
Anion gap: 11 (ref 5–15)
BUN: 13 mg/dL (ref 6–20)
CO2: 26 mmol/L (ref 22–32)
Calcium: 10.1 mg/dL (ref 8.9–10.3)
Chloride: 104 mmol/L (ref 98–111)
Creatinine: 0.76 mg/dL (ref 0.44–1.00)
GFR, Estimated: >60 mL/min (ref >60)
Glucose, Bld: 137 mg/dL — ABNORMAL HIGH (ref 70–99)
Potassium: 3.7 mmol/L (ref 3.5–5.1)
Sodium: 141 mmol/L (ref 135–145)
Total Bilirubin: 0.4 mg/dL (ref 0.3–1.2)
Total Protein: 7.5 g/dL (ref 6.5–8.1)

## 2021-02-25 LAB — CBC WITH DIFFERENTIAL (CANCER CENTER ONLY)
Abs Immature Granulocytes: 0.01 10*3/uL (ref 0.00–0.07)
Basophils Absolute: 0 10*3/uL (ref 0.0–0.1)
Basophils Relative: 1 %
Eosinophils Absolute: 0.2 10*3/uL (ref 0.0–0.5)
Eosinophils Relative: 3 %
HCT: 35.4 % — ABNORMAL LOW (ref 36.0–46.0)
Hemoglobin: 12 g/dL (ref 12.0–15.0)
Immature Granulocytes: 0 %
Lymphocytes Relative: 34 %
Lymphs Abs: 2.1 10*3/uL (ref 0.7–4.0)
MCH: 28.4 pg (ref 26.0–34.0)
MCHC: 33.9 g/dL (ref 30.0–36.0)
MCV: 83.9 fL (ref 80.0–100.0)
Monocytes Absolute: 0.3 10*3/uL (ref 0.1–1.0)
Monocytes Relative: 5 %
Neutro Abs: 3.4 10*3/uL (ref 1.7–7.7)
Neutrophils Relative %: 57 %
Platelet Count: 289 10*3/uL (ref 150–400)
RBC: 4.22 MIL/uL (ref 3.87–5.11)
RDW: 11.5 % (ref 11.5–15.5)
WBC Count: 6 10*3/uL (ref 4.0–10.5)
nRBC: 0 % (ref 0.0–0.2)

## 2021-02-25 MED ORDER — TRASTUZUMAB-ANNS CHEMO 150 MG IV SOLR
4.0000 mg/kg | Freq: Once | INTRAVENOUS | Status: AC
  Administered 2021-02-25: 12:00:00 231 mg via INTRAVENOUS
  Filled 2021-02-25: qty 11

## 2021-02-25 MED ORDER — SODIUM CHLORIDE 0.9% FLUSH
10.0000 mL | INTRAVENOUS | Status: DC | PRN
  Administered 2021-02-25: 15:00:00 10 mL

## 2021-02-25 MED ORDER — CLONIDINE HCL 0.1 MG PO TABS: 0.1000 mg | ORAL_TABLET | Freq: Once | ORAL | Status: DC

## 2021-02-25 MED ORDER — HEPARIN SOD (PORK) LOCK FLUSH 100 UNIT/ML IV SOLN
500.0000 [IU] | Freq: Once | INTRAVENOUS | Status: AC | PRN
  Administered 2021-02-25: 15:00:00 500 [IU]

## 2021-02-25 MED ORDER — ACETAMINOPHEN 325 MG PO TABS
650.0000 mg | ORAL_TABLET | Freq: Once | ORAL | Status: AC
  Administered 2021-02-25: 12:00:00 650 mg via ORAL

## 2021-02-25 MED ORDER — PACLITAXEL PROTEIN-BOUND CHEMO INJECTION 100 MG
100.0000 mg/m2 | Freq: Once | INTRAVENOUS | Status: AC
  Administered 2021-02-25: 14:00:00 150 mg via INTRAVENOUS
  Filled 2021-02-25: qty 30

## 2021-02-25 MED ORDER — SODIUM CHLORIDE 0.9 % IV SOLN: Freq: Once | INTRAVENOUS | Status: AC

## 2021-02-25 MED ORDER — DIPHENHYDRAMINE HCL 50 MG/ML IJ SOLN
25.0000 mg | Freq: Once | INTRAMUSCULAR | Status: AC
  Administered 2021-02-25: 12:00:00 25 mg via INTRAVENOUS

## 2021-02-25 MED ORDER — SODIUM CHLORIDE 0.9% FLUSH
10.0000 mL | INTRAVENOUS | Status: DC | PRN
  Administered 2021-02-25: 08:00:00 10 mL via INTRAVENOUS

## 2021-02-25 MED ORDER — CLONIDINE HCL 0.1 MG PO TABS
0.1000 mg | ORAL_TABLET | Freq: Once | ORAL | Status: AC
  Administered 2021-02-25: 11:00:00 0.1 mg via ORAL

## 2021-02-25 NOTE — Progress Notes (Signed)
Pt's BP at 1000 was 155/105, RN notified MD & NP.  At 1027 RN rechecked BP manually and BP was 152/98. RN made MD and NP aware.  RN received verbal order from Dr. Truitt Merle for 0.1mg  of clonidine to be given prior to treatment in infusion. OK to proceed with treatment per Dr. Burr Medico. At 1051 RN administered PO 0.1 mg of clonidine.  At 1121 RN rechecked pt's BP manually. BP 146/92

## 2021-02-25 NOTE — Patient Instructions (Signed)
[facility] ONCOLOGY  Discharge Instructions: Thank you for choosing [facility] to provide your oncology and hematology care.   If you have a lab appointment with the [facility], please go directly to the [facility] and check in at the registration area.   Wear comfortable clothing and clothing appropriate for easy access to any Portacath or PICC line.   We strive to give you quality time with your provider. You may need to reschedule your appointment if you arrive late (15 or more minutes).  Arriving late affects you and other patients whose appointments are after yours.  Also, if you miss three or more appointments without notifying the office, you may be dismissed from the clinic at the providers discretion.      For prescription refill requests, have your pharmacy contact our office and allow 72 hours for refills to be completed.    Today you received the following chemotherapy and/or immunotherapy agents: Kanjinti & Abraxane    To help prevent nausea and vomiting after your treatment, we encourage you to take your nausea medication as directed.  BELOW ARE SYMPTOMS THAT SHOULD BE REPORTED IMMEDIATELY: *FEVER GREATER THAN 100.4 F (38 C) OR HIGHER *CHILLS OR SWEATING *NAUSEA AND VOMITING THAT IS NOT CONTROLLED WITH YOUR NAUSEA MEDICATION *UNUSUAL SHORTNESS OF BREATH *UNUSUAL BRUISING OR BLEEDING *URINARY PROBLEMS (pain or burning when urinating, or frequent urination) *BOWEL PROBLEMS (unusual diarrhea, constipation, pain near the anus) TENDERNESS IN MOUTH AND THROAT WITH OR WITHOUT PRESENCE OF ULCERS (sore throat, sores in mouth, or a toothache) UNUSUAL RASH, SWELLING OR PAIN  UNUSUAL VAGINAL DISCHARGE OR ITCHING   Items with * indicate a potential emergency and should be followed up as soon as possible or go to the Emergency Department if any problems should occur.  Please show the CHEMOTHERAPY ALERT CARD or IMMUNOTHERAPY ALERT CARD at  check-in to the Emergency Department and triage nurse.  Should you have questions after your visit or need to cancel or reschedule your appointment, please contact [facility]  Dept: [phone]  and follow the prompts.  Office hours are 8:00 a.m. to 4:30 p.m. Monday - Friday. Please note that voicemails left after 4:00 p.m. may not be returned until the following business day.  We are closed weekends and major holidays. You have access to a nurse at all times for urgent questions. Please call the main number to the clinic Dept: [phone] and follow the prompts.   For any non-urgent questions, you may also contact your provider using MyChart. We now offer e-Visits for anyone 75 and older to request care online for non-urgent symptoms. For details visit [email or web address].   Also download the MyChart app! Go to the app store, search "MyChart", open the app, select [facility], and log in with your MyChart username and password.  Due to Covid, a mask is required upon entering the hospital/clinic. If you do not have a mask, one will be given to you upon arrival. For doctor visits, patients may have 1 support person aged 42 or older with them. For treatment visits, patients cannot have anyone with them due to current Covid guidelines and our immunocompromised population.   Trastuzumab injection for infusion What is this medication? TRASTUZUMAB (tras TOO zoo mab) is a monoclonal antibody. It is used to treat breast cancer and stomach cancer. This medicine may be used for other purposes; ask your health care provider or pharmacist if you have questions. COMMON BRAND NAME(S):  Herceptin, Donnald Garre What should I tell my care team before I take this medication? They need to know if you have any of these conditions: heart disease heart failure lung or breathing disease, like asthma an unusual or allergic reaction to trastuzumab,  benzyl alcohol, or other medications, foods, dyes, or preservatives pregnant or trying to get pregnant breast-feeding How should I use this medication? This drug is given as an infusion into a vein. It is administered in a hospital or clinic by a specially trained health care professional. Talk to your pediatrician regarding the use of this medicine in children. This medicine is not approved for use in children. Overdosage: If you think you have taken too much of this medicine contact a poison control center or emergency room at once. NOTE: This medicine is only for you. Do not share this medicine with others. What if I miss a dose? It is important not to miss a dose. Call your doctor or health care professional if you are unable to keep an appointment. What may interact with this medication? This medicine may interact with the following medications: certain types of chemotherapy, such as daunorubicin, doxorubicin, epirubicin, and idarubicin This list may not describe all possible interactions. Give your health care provider a list of all the medicines, herbs, non-prescription drugs, or dietary supplements you use. Also tell them if you smoke, drink alcohol, or use illegal drugs. Some items may interact with your medicine. What should I watch for while using this medication? Visit your doctor for checks on your progress. Report any side effects. Continue your course of treatment even though you feel ill unless your doctor tells you to stop. Call your doctor or health care professional for advice if you get a fever, chills or sore throat, or other symptoms of a cold or flu. Do not treat yourself. Try to avoid being around people who are sick. You may experience fever, chills and shaking during your first infusion. These effects are usually mild and can be treated with other medicines. Report any side effects during the infusion to your health care professional. Fever and chills usually do not happen  with later infusions. Do not become pregnant while taking this medicine or for 7 months after stopping it. Women should inform their doctor if they wish to become pregnant or think they might be pregnant. Women of child-bearing potential will need to have a negative pregnancy test before starting this medicine. There is a potential for serious side effects to an unborn child. Talk to your health care professional or pharmacist for more information. Do not breast-feed an infant while taking this medicine or for 7 months after stopping it. Women must use effective birth control with this medicine. What side effects may I notice from receiving this medication? Side effects that you should report to your doctor or health care professional as soon as possible: allergic reactions like skin rash, itching or hives, swelling of the face, lips, or tongue chest pain or palpitations cough dizziness feeling faint or lightheaded, falls fever general ill feeling or flu-like symptoms signs of worsening heart failure like breathing problems; swelling in your legs and feet unusually weak or tired Side effects that usually do not require medical attention (report to your doctor or health care professional if they continue or are bothersome): bone pain changes in taste diarrhea joint pain nausea/vomiting weight loss This list may not describe all possible side effects. Call your doctor for medical advice about side effects.  You may report side effects to FDA at 1-800-FDA-1088. Where should I keep my medication? This drug is given in a hospital or clinic and will not be stored at home. NOTE: This sheet is a summary. It may not cover all possible information. If you have questions about this medicine, talk to your doctor, pharmacist, or health care provider.  2022 Elsevier/Gold Standard (2016-03-10 00:00:00)  Nanoparticle Albumin-Bound Paclitaxel injection What is this medication? NANOPARTICLE ALBUMIN-BOUND  PACLITAXEL (Na no PAHR ti kuhl  al BYOO muhn-bound  PAK li TAX el) is a chemotherapy drug. It targets fast dividing cells, like cancer cells, and causes these cells to die. This medicine is used to treat advanced breast cancer, lung cancer, and pancreatic cancer. This medicine may be used for other purposes; ask your health care provider or pharmacist if you have questions. COMMON BRAND NAME(S): Abraxane What should I tell my care team before I take this medication? They need to know if you have any of these conditions: kidney disease liver disease low blood counts, like low white cell, platelet, or red cell counts lung or breathing disease, like asthma tingling of the fingers or toes, or other nerve disorder an unusual or allergic reaction to paclitaxel, albumin, other chemotherapy, other medicines, foods, dyes, or preservatives pregnant or trying to get pregnant breast-feeding How should I use this medication? This drug is given as an infusion into a vein. It is administered in a hospital or clinic by a specially trained health care professional. Talk to your pediatrician regarding the use of this medicine in children. Special care may be needed. Overdosage: If you think you have taken too much of this medicine contact a poison control center or emergency room at once. NOTE: This medicine is only for you. Do not share this medicine with others. What if I miss a dose? It is important not to miss your dose. Call your doctor or health care professional if you are unable to keep an appointment. What may interact with this medication? This medicine may interact with the following medications: antiviral medicines for hepatitis, HIV or AIDS certain antibiotics like erythromycin and clarithromycin certain medicines for fungal infections like ketoconazole and itraconazole certain medicines for seizures like carbamazepine, phenobarbital, phenytoin gemfibrozil nefazodone rifampin [facility]  wort This list may not describe all possible interactions. Give your health care provider a list of all the medicines, herbs, non-prescription drugs, or dietary supplements you use. Also tell them if you smoke, drink alcohol, or use illegal drugs. Some items may interact with your medicine. What should I watch for while using this medication? Your condition will be monitored carefully while you are receiving this medicine. You will need important blood work done while you are taking this medicine. This medicine can cause serious allergic reactions. If you experience allergic reactions like skin rash, itching or hives, swelling of the face, lips, or tongue, tell your doctor or health care professional right away. In some cases, you may be given additional medicines to help with side effects. Follow all directions for their use. This drug may make you feel generally unwell. This is not uncommon, as chemotherapy can affect healthy cells as well as cancer cells. Report any side effects. Continue your course of treatment even though you feel ill unless your doctor tells you to stop. Call your doctor or health care professional for advice if you get a fever, chills or sore throat, or other symptoms of a cold or flu. Do not treat yourself. This drug  decreases your body's ability to fight infections. Try to avoid being around people who are sick. This medicine may increase your risk to bruise or bleed. Call your doctor or health care professional if you notice any unusual bleeding. Be careful brushing and flossing your teeth or using a toothpick because you may get an infection or bleed more easily. If you have any dental work done, tell your dentist you are receiving this medicine. Avoid taking products that contain aspirin, acetaminophen, ibuprofen, naproxen, or ketoprofen unless instructed by your doctor. These medicines may hide a fever. Do not become pregnant while taking this medicine or for 6 months after  stopping it. Women should inform their doctor if they wish to become pregnant or think they might be pregnant. Men should not father a child while taking this medicine or for 3 months after stopping it. There is a potential for serious side effects to an unborn child. Talk to your health care professional or pharmacist for more information. Do not breast-feed an infant while taking this medicine or for 2 weeks after stopping it. This medicine may interfere with the ability to get pregnant or to father a child. You should talk to your doctor or health care professional if you are concerned about your fertility. What side effects may I notice from receiving this medication? Side effects that you should report to your doctor or health care professional as soon as possible: allergic reactions like skin rash, itching or hives, swelling of the face, lips, or tongue breathing problems changes in vision fast, irregular heartbeat low blood pressure mouth sores pain, tingling, numbness in the hands or feet signs of decreased platelets or bleeding - bruising, pinpoint red spots on the skin, black, tarry stools, blood in the urine signs of decreased red blood cells - unusually weak or tired, feeling faint or lightheaded, falls signs of infection - fever or chills, cough, sore throat, pain or difficulty passing urine signs and symptoms of liver injury like dark yellow or brown urine; general ill feeling or flu-like symptoms; light-colored stools; loss of appetite; nausea; right upper belly pain; unusually weak or tired; yellowing of the eyes or skin swelling of the ankles, feet, hands unusually slow heartbeat Side effects that usually do not require medical attention (report to your doctor or health care professional if they continue or are bothersome): diarrhea hair loss loss of appetite nausea, vomiting tiredness This list may not describe all possible side effects. Call your doctor for medical advice  about side effects. You may report side effects to FDA at 1-800-FDA-1088. Where should I keep my medication? This drug is given in a hospital or clinic and will not be stored at home. NOTE: This sheet is a summary. It may not cover all possible information. If you have questions about this medicine, talk to your doctor, pharmacist, or health care provider.  2022 Elsevier/Gold Standard (2016-10-27 00:00:00)

## 2021-02-27 ENCOUNTER — Other Ambulatory Visit: Payer: Self-pay

## 2021-02-27 ENCOUNTER — Encounter: Payer: Self-pay | Admitting: Rehabilitation

## 2021-02-27 ENCOUNTER — Encounter: Payer: Self-pay | Admitting: Hematology

## 2021-02-27 ENCOUNTER — Ambulatory Visit: Payer: Managed Care, Other (non HMO) | Admitting: Rehabilitation

## 2021-02-27 DIAGNOSIS — Z483 Aftercare following surgery for neoplasm: Secondary | ICD-10-CM

## 2021-02-27 DIAGNOSIS — R293 Abnormal posture: Secondary | ICD-10-CM

## 2021-02-27 DIAGNOSIS — M25611 Stiffness of right shoulder, not elsewhere classified: Secondary | ICD-10-CM

## 2021-02-27 DIAGNOSIS — C50411 Malignant neoplasm of upper-outer quadrant of right female breast: Secondary | ICD-10-CM

## 2021-02-27 DIAGNOSIS — R600 Localized edema: Secondary | ICD-10-CM

## 2021-02-27 DIAGNOSIS — M25612 Stiffness of left shoulder, not elsewhere classified: Secondary | ICD-10-CM

## 2021-02-27 NOTE — Therapy (Signed)
[facility] @ [facility] [place], [place], [address] Phone: [phone]   Fax:  [phone]  Physical Therapy Treatment  Patient Details  Name: Dana Kramer MRN: [id number] Date of Birth: May 31, 1965 Referring Provider (PT): Dr. Erroll Luna   Encounter Date: 02/27/2021   PT End of Session - 02/27/21 1539     Visit Number 3    Number of Visits 10    Date for PT Re-Evaluation 03/20/21    PT Start Time 1310    PT Stop Time 3428    PT Time Calculation (min) 45 min    Activity Tolerance Patient tolerated treatment well    Behavior During Therapy [facility] for tasks assessed/performed             Past Medical History:  Diagnosis Date   Allergy 11/1998   Penicillin   Chicken pox    History of transfusion of whole blood    Hyperlipidemia    Malignant neoplasm of upper-outer quadrant of right breast in female, estrogen receptor positive ([facility]) 01/13/2021   Migraine    Thyroid disease    Hypothyroidism    Past Surgical History:  Procedure Laterality Date   ABDOMINOPLASTY  10/01/2010   CESAREAN SECTION     twice   HERNIA REPAIR     PORTACATH PLACEMENT Right 01/23/2021   Procedure: Right internal jugular 8 [place] PowerPort with C arm and ultrasound guidance;  Surgeon: Erroll Luna, MD;  Location: [facility];  Service: General;  Laterality: Right;   SENTINEL NODE BIOPSY Right 01/23/2021   Procedure: Right axillary sentinel lymph node mapping using Mag Trace;  Surgeon: Erroll Luna, MD;  Location: [facility];  Service: General;  Laterality: Right;   SIMPLE MASTECTOMY WITH AXILLARY SENTINEL NODE BIOPSY Bilateral 01/23/2021   Procedure: Right simple mastectomy ;  Left risk reducing simple mastectomy;  Surgeon: Erroll Luna, MD;  Location: [facility];  Service: General;  Laterality: Bilateral;   TUBAL LIGATION     umblica hernia repair  76/81/1572    There were no vitals filed for this visit.   Subjective Assessment - 02/27/21  1312     Subjective I had my first chemotherapy on Tuesday.  Feeling okay today.  My arms are starting to move better.    Pertinent History Patient was diagnosed on 12/10/2020 with right grade II invasive ductal carcinoma breast cancer. She underwent a bilateral mastectomy with a right sentinel node biopsy (4 negative nodes) on 01/23/2021. It is ER/PR positive and HER2 negative with a Ki67 of 10%.    Currently in Pain? No/denies                               [facility] Adult PT Treatment/Exercise - 02/27/21 0001       Exercises   Exercises Other Exercises    Other Exercises  supine dowel flexion x 5, palms up supine pectoralis stretch by just laying with breathing x 60", seated bil ER x 10      Manual Therapy   Manual Therapy Passive ROM;Manual Lymphatic Drainage (MLD)    Manual Lymphatic Drainage (MLD) performed short neck, Lt axillary nodes, sternal nodes, Then focus on Lt anterior chest and pectoralis region to promote relaxation and work on seroma.  No moveable fluid today but more puffness above site of ace wrap    Passive ROM to bil shoulders to tolerance with vcs to relax throughout.  Lt UE  wanting to spasm in the pectoralis so this limited PROM today                          PT Long Term Goals - 02/20/21 1409       PT LONG TERM GOAL #1   Title Patient will demonstrate she has regained full shoulder ROM and function post operatively compared to baselines.    Time 4    Period Weeks    Status On-going    Target Date 03/20/21      PT LONG TERM GOAL #2   Title Patient will increase bilateral shoulder flexion to >/= 150 degrees for increased ease reaching.    Time 4    Period Weeks    Status New    Target Date 03/20/21      PT LONG TERM GOAL #3   Title Patient will increase bil shoulder abduction to >/= 160 degrees for increased ease with normal daily tasks and work tasks.    Time 4    Period Weeks    Status New    Target Date 03/20/21       PT LONG TERM GOAL #4   Title Patient will be independent with her HEP for shoulder ROM.    Time 4    Period Weeks    Status New    Target Date 03/20/21      PT LONG TERM GOAL #5   Title Patient will verbalize good understanding of lymphedema risk reduction practices after attending the After Breast Cancer Class.    Time 4    Period Weeks    Status New    Target Date 03/20/21                   Plan - 02/27/21 1539     Clinical Impression Statement Pt has been wearing ace wrap with 2 foam pads in knee braces over the anterior chest.  Left side with puffiness up over the bandage line so wrapped pt with less tension upon leaving.  Started POCM, AAROM which was tolerated well except for on the Lt side with attempted pectoralis spasm with PROM and activites.  Pt had initially said she was not going to use mm relaxer but may reconsider after discussion.    PT Frequency 2x / week    PT Duration 4 weeks    PT Treatment/Interventions ADLs/Self Care Home Management;Therapeutic exercise;Patient/family education;Manual techniques;Manual lymph drainage;Passive range of motion;Scar mobilization;Therapeutic activities    PT Next Visit Plan PROM bil shoulders and AAROM exercises may be ready for pulleys, MLD as needed    Consulted and Agree with Plan of Care Patient             Patient will benefit from skilled therapeutic intervention in order to improve the following deficits and impairments:  Postural dysfunction, Decreased range of motion, Decreased knowledge of precautions, Impaired UE functional use, Pain, Increased edema, Decreased scar mobility, Increased fascial restricitons  Visit Diagnosis: Malignant neoplasm of upper-outer quadrant of right breast in female, estrogen receptor positive ([facility])  Abnormal posture  Stiffness of left shoulder, not elsewhere classified  Stiffness of right shoulder, not elsewhere classified  Localized edema  Aftercare following surgery for  neoplasm     Problem List Patient Active Problem List   Diagnosis Date Noted   Genetic testing 01/27/2021   S/P mastectomy, bilateral 01/23/2021   Family history of colon cancer 01/15/2021   Malignant neoplasm of upper-outer  quadrant of right breast in female, estrogen receptor positive ([facility]) 01/13/2021   Pre-diabetes 11/06/2020   Acquired hypothyroidism 04/14/2018   Migraine 01/18/2015   Insomnia 11/05/2011   Anxiety 11/05/2011   Hyperlipidemia 11/05/2011   Tachycardia 51/58/2658   Umbilical hernia 71/84/1085    Stark Bray, PT 02/27/2021, 3:43 PM  [facility] @ [facility] [place], [place], [address] Phone: [phone]   Fax:  [phone]  Name: Dana Kramer MRN: [id number] Date of Birth: [birth year]

## 2021-03-03 ENCOUNTER — Encounter: Payer: Self-pay | Admitting: Family Medicine

## 2021-03-03 DIAGNOSIS — R000 Tachycardia, unspecified: Secondary | ICD-10-CM

## 2021-03-03 DIAGNOSIS — I10 Essential (primary) hypertension: Secondary | ICD-10-CM

## 2021-03-03 MED ORDER — AMLODIPINE BESYLATE 5 MG PO TABS: 5.0000 mg | ORAL_TABLET | Freq: Every day | ORAL | 3 refills | Status: DC

## 2021-03-03 MED ORDER — METOPROLOL SUCCINATE ER 50 MG PO TB24: 50.0000 mg | ORAL_TABLET | Freq: Every day | ORAL | 3 refills | Status: DC

## 2021-03-04 ENCOUNTER — Other Ambulatory Visit: Payer: Self-pay

## 2021-03-04 ENCOUNTER — Other Ambulatory Visit: Payer: Self-pay | Admitting: Hematology and Oncology

## 2021-03-04 ENCOUNTER — Inpatient Hospital Stay (HOSPITAL_BASED_OUTPATIENT_CLINIC_OR_DEPARTMENT_OTHER): Payer: Managed Care, Other (non HMO) | Admitting: Physician Assistant

## 2021-03-04 ENCOUNTER — Encounter: Payer: Self-pay | Admitting: Hematology

## 2021-03-04 ENCOUNTER — Inpatient Hospital Stay: Payer: Managed Care, Other (non HMO)

## 2021-03-04 VITALS — BP 158/99 | HR 91 | Temp 97.7°F | Resp 20 | Wt 125.4 lb

## 2021-03-04 DIAGNOSIS — Z170 Estrogen receptor positive status [ER+]: Secondary | ICD-10-CM | POA: Diagnosis not present

## 2021-03-04 DIAGNOSIS — Z95828 Presence of other vascular implants and grafts: Secondary | ICD-10-CM | POA: Insufficient documentation

## 2021-03-04 DIAGNOSIS — C50411 Malignant neoplasm of upper-outer quadrant of right female breast: Secondary | ICD-10-CM

## 2021-03-04 DIAGNOSIS — Z5112 Encounter for antineoplastic immunotherapy: Secondary | ICD-10-CM | POA: Diagnosis not present

## 2021-03-04 LAB — CMP (CANCER CENTER ONLY)
ALT: 9 U/L (ref 0–44)
AST: 19 U/L (ref 15–41)
Albumin: 4.4 g/dL (ref 3.5–5.0)
Alkaline Phosphatase: 55 U/L (ref 38–126)
Anion gap: 8 (ref 5–15)
BUN: 12 mg/dL (ref 6–20)
CO2: 26 mmol/L (ref 22–32)
Calcium: 9.7 mg/dL (ref 8.9–10.3)
Chloride: 104 mmol/L (ref 98–111)
Creatinine: 0.7 mg/dL (ref 0.44–1.00)
GFR, Estimated: >60 mL/min (ref >60)
Glucose, Bld: 106 mg/dL — ABNORMAL HIGH (ref 70–99)
Potassium: 4.3 mmol/L (ref 3.5–5.1)
Sodium: 138 mmol/L (ref 135–145)
Total Bilirubin: 0.3 mg/dL (ref 0.3–1.2)
Total Protein: 7.3 g/dL (ref 6.5–8.1)

## 2021-03-04 LAB — CBC WITH DIFFERENTIAL (CANCER CENTER ONLY)
Abs Immature Granulocytes: 0.03 10*3/uL (ref 0.00–0.07)
Basophils Absolute: 0 10*3/uL (ref 0.0–0.1)
Basophils Relative: 1 %
Eosinophils Absolute: 0.2 10*3/uL (ref 0.0–0.5)
Eosinophils Relative: 3 %
HCT: 33.1 % — ABNORMAL LOW (ref 36.0–46.0)
Hemoglobin: 11.5 g/dL — ABNORMAL LOW (ref 12.0–15.0)
Immature Granulocytes: 1 %
Lymphocytes Relative: 48 %
Lymphs Abs: 2.6 10*3/uL (ref 0.7–4.0)
MCH: 28.9 pg (ref 26.0–34.0)
MCHC: 34.7 g/dL (ref 30.0–36.0)
MCV: 83.2 fL (ref 80.0–100.0)
Monocytes Absolute: 0.2 10*3/uL (ref 0.1–1.0)
Monocytes Relative: 3 %
Neutro Abs: 2.3 10*3/uL (ref 1.7–7.7)
Neutrophils Relative %: 44 %
Platelet Count: 308 10*3/uL (ref 150–400)
RBC: 3.98 MIL/uL (ref 3.87–5.11)
RDW: 11.7 % (ref 11.5–15.5)
WBC Count: 5.3 10*3/uL (ref 4.0–10.5)
nRBC: 0 % (ref 0.0–0.2)

## 2021-03-04 MED ORDER — CYCLOBENZAPRINE HCL 5 MG PO TABS: 5.0000 mg | ORAL_TABLET | Freq: Three times a day (TID) | ORAL | 0 refills | Status: DC | PRN

## 2021-03-04 MED ORDER — SODIUM CHLORIDE 0.9% FLUSH
10.0000 mL | Freq: Once | INTRAVENOUS | Status: AC
  Administered 2021-03-04: 14:00:00 10 mL

## 2021-03-04 MED ORDER — HEPARIN SOD (PORK) LOCK FLUSH 100 UNIT/ML IV SOLN: 500.0000 [IU] | Freq: Once | INTRAVENOUS | Status: DC

## 2021-03-04 NOTE — Progress Notes (Signed)
Met with patient at registration to introduce myself as Arboriculturist and to offer available resources.  Discussed one-time $1000 Radio broadcast assistant to assist with personal expenses while going through treatment.  Also, mentioned available copay assistance may be available for specific treatment drugs should her insurance leave her with a balance. Advised I would reach out to her to advise what is needed to apply when and if needed. She verbalized understanding.  She has my card for any additional financial questions or concerns.

## 2021-03-04 NOTE — Progress Notes (Signed)
[place]   Telephone:([phone] [phone]   Clinic Follow up Note   Patient Care Team: Copland, Gay Filler, MD as PCP - General (Family Medicine) Erroll Luna, MD as Consulting Physician (General Surgery) Truitt Merle, MD as Consulting Physician (Hematology) Eppie Gibson, MD as Attending Physician (Radiation Oncology) Mauro Kaufmann, RN as Oncology Nurse Navigator [name], RN as Oncology Nurse Navigator 03/04/2021  CHIEF COMPLAINT: Follow up right breast cancer   SUMMARY OF ONCOLOGIC HISTORY: Oncology History Overview Note   Cancer Staging  Malignant neoplasm of upper-outer quadrant of right breast in female, estrogen receptor positive ([place]) Staging form: Breast, AJCC 8th Edition - Clinical stage from 01/08/2021: Stage IA (cT1b, cN0, cM0, G2, ER+, PR+, HER2+) - Signed by Truitt Merle, MD on 01/15/2021 - Pathologic stage from 01/23/2021: Stage IA (pT1c, pN0, cM0, G2, ER+, PR+, HER2+) - Signed by Truitt Merle, MD on 02/13/2021     Malignant neoplasm of upper-outer quadrant of right breast in female, estrogen receptor positive ([place])  01/02/2021 Mammogram   EXAM: DIGITAL DIAGNOSTIC BILATERAL MAMMOGRAM WITH TOMOSYNTHESIS AND CAD; ULTRASOUND LEFT BREAST LIMITED; ULTRASOUND RIGHT BREAST LIMITED  IMPRESSION: 1. Highly suspicious 0.7 cm UPPER-OUTER RIGHT breast mass with associated suspicious calcifications extending 1.5 cm anteriorly. Tissue sampling is recommended. 2. No abnormal appearing RIGHT axillary lymph nodes. 3. Benign LOWER LEFT breast cyst corresponding to the LEFT breast screening study finding.   01/08/2021 Cancer Staging   Staging form: Breast, AJCC 8th Edition - Clinical stage from 01/08/2021: Stage IA (cT1b, cN0, cM0, G2, ER+, PR+, HER2+) - Signed by Truitt Merle, MD on 01/15/2021 Stage prefix: Initial diagnosis Nuclear grade: G2 Histologic grading system: 3 grade system    01/08/2021 Pathology Results   Diagnosis Breast, right, needle  core biopsy, upper outer quadrant, 11 o'clock, 5cmfn, ribbon clip - INVASIVE DUCTAL CARCINOMA - DUCTAL CARCINOMA IN SITU WITH CALCIFICATIONS - SEE COMMENT Microscopic Comment Based on the biopsy, the carcinoma appears Nottingham grade 2 of 3 and measures 0.6 cm in greatest linear extent.  PROGNOSTIC INDICATORS Results: The tumor cells are 2+ for Her2 (EQUIVOCAL). Her2 by FISH will be performed and results reported separately. Estrogen Receptor: 100%, POSITIVE, STRONG STAINING INTENSITY Progesterone Receptor: 70%, POSITIVE, STRONG STAINING INTENSITY Proliferation Marker Ki67: 10%  FLUORESCENCE IN-SITU HYBRIDIZATION Results: GROUP 1: HER2 **POSITIVE**   01/13/2021 Initial Diagnosis   Malignant neoplasm of upper-outer quadrant of right breast in female, estrogen receptor positive ([place])   01/15/2021 Genetic Testing   [facility] CustomNext Panel was Negative. Report date is 01/26/2021.  The CustomNext-Cancer+RNAinsight panel offered by [facility] includes sequencing and rearrangement analysis for the following 47 genes:  APC, ATM, AXIN2, BARD1, BMPR1A, BRCA1, BRCA2, BRIP1, CDH1, CDK4, CDKN2A, CHEK2, CTNNA1, DICER1, EPCAM, GREM1, HOXB13, KIT, MEN1, MLH1, MSH2, MSH3, MSH6, MUTYH, NBN, NF1, NTHL1, PALB2, PDGFRA, PMS2, POLD1, POLE, PTEN, RAD50, RAD51C, RAD51D, SDHA, SDHB, SDHC, SDHD, SMAD4, SMARCA4, STK11, TP53, TSC1, TSC2, and VHL.  RNA data is routinely analyzed for use in variant interpretation for all genes.   01/23/2021 Cancer Staging   Staging form: Breast, AJCC 8th Edition - Pathologic stage from 01/23/2021: Stage IA (pT1c, pN0, cM0, G2, ER+, PR+, HER2+) - Signed by Truitt Merle, MD on 02/13/2021 Histologic grading system: 3 grade system Residual tumor (R): R0 - None    01/23/2021 Definitive Surgery   FINAL MICROSCOPIC DIAGNOSIS:   A. BREAST, LEFT, MASTECTOMY:  - Fibrocystic changes with sclerosing adenosis and calcifications.  - Fibroadenoma (0.6 cm).  - No evidence of  malignancy.    B. BREAST, RIGHT, MASTECTOMY:  - Invasive and in situ ductal carcinoma, 1.7 cm.  - Margins negative for carcinoma.  - Biopsy site and biopsy clip.  - See oncology table.   C. LYMPH NODE, RIGHT AXILLARY, SENTINEL, EXCISION:  - One lymph node negative for metastatic carcinoma (0/1).   D. LYMPH NODE, RIGHT AXILLARY, SENTINEL, EXCISION:  - One lymph node negative for metastatic carcinoma (0/1).   E. LYMPH NODE, RIGHT AXILLARY, SENTINEL, EXCISION:  - One lymph node negative for metastatic carcinoma (0/1).   F. LYMPH NODE, RIGHT AXILLARY, SENTINEL, EXCISION:  - One lymph node negative for metastatic carcinoma (0/1).    02/25/2021 -  Chemotherapy   Patient is on Treatment Plan : BREAST Paclitaxel + Trastuzumab q7d / Trastuzumab q21d       CURRENT THERAPY: Adjuvant chemotherapy paclitaxel/trastuzumab weekly x12, followed by maintenance trastuzumab q3 weeks to complete 1 year  INTERVAL HISTORY: Ms. [name] returns for follow up after initiating adjuvant chemotherapy last week. She is due for next chemotherapy infusion tomorrow, 03/05/2021.   She continues to recover from her surgery. She reports persistent pectoral tightness which limits her range of motion. This has worsened with physical therapy. Incisional sites continue to heal with some swelling noted. She experienced some fatigue for two days following chemotherapy. She continues to complete her daily ADLs on her own. She reports appetite loss but tries to eat and supplements her diet with Ensure shakes.  She reports intermittent episodes of nausea without any vomiting.  She reports her nausea improves with antiemetics.  Patient reports that her bowel habits are unchanged without any diarrhea or constipation.  She denies easy bruising or signs of eating.  She was prescribed amlodipine 5 mg to take with metoprolol 50 mg due to her blood pressure readings of 160s/100s. She develops headaches with her elevated blood pressures.  She denies any  fevers, chills, night sweats, shortness of breath, chest pain or cough.  She has no other complaints.  Rest of the 10 point ROS is below.   MEDICAL HISTORY:  Past Medical History:  Diagnosis Date   Allergy 11/1998   Penicillin   Chicken pox    History of transfusion of whole blood    Hyperlipidemia    Malignant neoplasm of upper-outer quadrant of right breast in female, estrogen receptor positive ([place]) 01/13/2021   Migraine    Thyroid disease    Hypothyroidism    SURGICAL HISTORY: Past Surgical History:  Procedure Laterality Date   ABDOMINOPLASTY  10/01/2010   CESAREAN SECTION     twice   HERNIA REPAIR     PORTACATH PLACEMENT Right 01/23/2021   Procedure: Right internal jugular 8 [place] PowerPort with C arm and ultrasound guidance;  Surgeon: Erroll Luna, MD;  Location: [facility];  Service: General;  Laterality: Right;   SENTINEL NODE BIOPSY Right 01/23/2021   Procedure: Right axillary sentinel lymph node mapping using Mag Trace;  Surgeon: Erroll Luna, MD;  Location: [facility];  Service: General;  Laterality: Right;   SIMPLE MASTECTOMY WITH AXILLARY SENTINEL NODE BIOPSY Bilateral 01/23/2021   Procedure: Right simple mastectomy ;  Left risk reducing simple mastectomy;  Surgeon: Erroll Luna, MD;  Location: [facility];  Service: General;  Laterality: Bilateral;   TUBAL LIGATION     umblica hernia repair  81/44/8185    I have reviewed the social history and family history with the patient and they are unchanged from previous note.  ALLERGIES:  is allergic to penicillins.  MEDICATIONS:  Current Outpatient Medications  Medication Sig Dispense Refill   amLODipine (NORVASC) 5 MG tablet Take 1 tablet (5 mg total) by mouth daily. 90 tablet 3   b complex vitamins capsule Take 1 capsule by mouth daily.     Calcium Citrate-Vitamin D (CALCIUM + D PO) Take 1 tablet by mouth daily.     Cholecalciferol (VITAMIN D PO) Take 1 tablet by mouth daily.     cyclobenzaprine (FLEXERIL) 5 MG tablet  Take 1 tablet (5 mg total) by mouth 3 (three) times daily as needed for muscle spasms. 10 tablet 0   ibuprofen (ADVIL) 800 MG tablet Take 1 tablet (800 mg total) by mouth every 8 (eight) hours as needed. 30 tablet 0   Ibuprofen-diphenhydrAMINE HCl (ADVIL PM) 200-25 MG CAPS Take 2 tablets by mouth daily as needed (pain).     levothyroxine (SYNTHROID) 25 MCG tablet TAKE 1 TABLET(25 MCG) BY MOUTH DAILY BEFORE BREAKFAST 90 tablet 3   lidocaine-prilocaine (EMLA) cream Apply 1 application topically as needed (To be applied to portacath 30 to 60 mins prior to portacath access). 30 g 2   Melatonin 10 MG CAPS Take 10 mg by mouth at bedtime.     metoprolol succinate (TOPROL-XL) 50 MG 24 hr tablet Take 1 tablet (50 mg total) by mouth daily. Take with or immediately following a meal. 90 tablet 3   Multiple Vitamins-Minerals (WOMENS MULTIVITAMIN PO) Take 1 tablet by mouth daily.     omeprazole (PRILOSEC OTC) 20 MG tablet Take 20 mg by mouth daily.     ondansetron (ZOFRAN) 8 MG tablet Take 1 tablet (8 mg total) by mouth 2 (two) times daily as needed (Nausea or vomiting). 30 tablet 1   oxyCODONE (OXY IR/ROXICODONE) 5 MG immediate release tablet Take 1 tablet (5 mg total) by mouth every 6 (six) hours as needed for severe pain. 15 tablet 0   prochlorperazine (COMPAZINE) 10 MG tablet Take 1 tablet (10 mg total) by mouth every 6 (six) hours as needed (Nausea or vomiting). 30 tablet 1   simvastatin (ZOCOR) 20 MG tablet TAKE 1 TABLET(20 MG) BY MOUTH AT BEDTIME 90 tablet 3   No current facility-administered medications for this visit.    PHYSICAL EXAMINATION: ECOG PERFORMANCE STATUS: 1 - Symptomatic but completely ambulatory  Vitals:   03/04/21 1428  BP: (!) 158/99  Pulse: 91  Resp: 20  Temp: 97.7 F (36.5 C)  SpO2: 99%   Filed Weights   03/04/21 1428  Weight: 125 lb 6.4 oz (56.9 kg)    GENERAL:alert, no distress and comfortable SKIN: Without rash EYES: sclera clear LUNGS: clear with normal breathing  effort HEART: regular rate & rhythm, no lower extremity edema Musculoskeletal: Limited ROM left shoulder NEURO: alert & oriented x 3 with fluent speech, no focal motor/sensory deficits PAC site with mild edema, no erythema. Incision closed Breast exam deffered.    LABORATORY DATA:  I have reviewed the data as listed CBC Latest Ref Rng & Units 03/04/2021 02/25/2021 01/15/2021  WBC 4.0 - 10.5 K/uL 5.3 6.0 5.8  Hemoglobin 12.0 - 15.0 g/dL 11.5(L) 12.0 11.8(L)  Hematocrit 36.0 - 46.0 % 33.1(L) 35.4(L) 36.4  Platelets 150 - 400 K/uL 308 289 194     CMP Latest Ref Rng & Units 03/04/2021 02/25/2021 01/15/2021  Glucose 70 - 99 mg/dL 106(H) 137(H) 105(H)  BUN 6 - 20 mg/dL 12 13 11   Creatinine 0.44 - 1.00 mg/dL 0.70 0.76 0.83  Sodium 135 - 145 mmol/L 138 141 142  Potassium 3.5 - 5.1 mmol/L 4.3 3.7 3.5  Chloride 98 - 111 mmol/L 104 104 108  CO2 22 - 32 mmol/L 26 26 24   Calcium 8.9 - 10.3 mg/dL 9.7 10.1 9.2  Total Protein 6.5 - 8.1 g/dL 7.3 7.5 7.3  Total Bilirubin 0.3 - 1.2 mg/dL 0.3 0.4 0.3  Alkaline Phos 38 - 126 U/L 55 65 59  AST 15 - 41 U/L 19 16 19   ALT 0 - 44 U/L 9 7 9       RADIOGRAPHIC STUDIES: I have personally reviewed the radiological images as listed and agreed with the findings in the report. No results found.   ASSESSMENT & PLAN: Dana Kramer is a 54 y.o. female with    1. Malignant neoplasm of upper-outer quadrant of right breast, Stage IA, c(T1bN0M0), Triple positive, Grade 2 -screening detected 0.7 cm upper-outer right breast mass with associated calcifications extending 1.5 cm. No abnormal right lymph nodes or left malignancy. 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 -S/p b/l mastectomies on 01/23/21 with Dr. Brantley Stage. 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  Port was placed during surgery -Due to Her2+ disease adjuvant chemotherapy was recommended, she will proceed with weekly Abraxane/Herceptin x12 weeks followed  by maintenance Herceptin every 3 weeks to total a year.  -Treatment plan includes adjuvant antiestrogen therapy to follow.  She would not need postmastectomy radiation -Baseline EF 60-65% with impaired relaxation (grade I diastolic dysfunction). Will monitor q3 months on herceptin -Started adjuvant chemotherapy with weekly Abraxane/Herceptin on 02/25/2021.   2.  Elevated blood pressure and anxiety -Due to elevated BP readings at home (160/100s), patient's PCP prescribed amlodipine 5 mg once daily and continue with metoprolol 50 mg once daily.  -Gave 0.1 mg clonidine last week with her treatment  -BP today was 158/99.  -Monitor closely and advised to check BP at home.   3. Left pectoral tightness/limited range of motion -Currently undergoing PT. -Dr. Chryl Heck prescribed Flexeril 32m PO TID as needed   PLAN: -Labs from today were reviewed. There is mild anemia with Hgb 11.5, otherwise remaining labs are unremarkable and adequate for treatment tomorrow.  -Proceed with weekly abraxane/herceptin, next one scheduled for tomorrow 03/05/21 -Continue PT -Next follow up visit with LCira Rue NP on 03/18/2021.    All questions were answered. The patient knows to call the clinic with any problems, questions or concerns.  I have spent a total of 30 minutes minutes of face-to-face and non-face-to-face time, preparing to see the patient, performing a medically appropriate examination, counseling and educating the patient, documenting clinical information in the electronic health record, and care coordination.   ILincoln Brigham PA-C Hematology and [place]: [phone]

## 2021-03-04 NOTE — Progress Notes (Signed)
Pt requested to stay accessed overnight due to having infusion tomorrow (12/28) Sorbaview dressing and biopatch applied.

## 2021-03-05 ENCOUNTER — Inpatient Hospital Stay: Payer: Managed Care, Other (non HMO)

## 2021-03-05 ENCOUNTER — Encounter: Payer: Self-pay | Admitting: *Deleted

## 2021-03-05 VITALS — BP 149/99 | HR 95 | Temp 97.7°F | Resp 18

## 2021-03-05 DIAGNOSIS — Z5112 Encounter for antineoplastic immunotherapy: Secondary | ICD-10-CM | POA: Diagnosis not present

## 2021-03-05 DIAGNOSIS — Z170 Estrogen receptor positive status [ER+]: Secondary | ICD-10-CM

## 2021-03-05 MED ORDER — HEPARIN SOD (PORK) LOCK FLUSH 100 UNIT/ML IV SOLN
500.0000 [IU] | Freq: Once | INTRAVENOUS | Status: AC | PRN
  Administered 2021-03-05: 10:00:00 500 [IU]

## 2021-03-05 MED ORDER — ACETAMINOPHEN 325 MG PO TABS: 650.0000 mg | ORAL_TABLET | Freq: Once | ORAL | Status: DC

## 2021-03-05 MED ORDER — TRASTUZUMAB-ANNS CHEMO 150 MG IV SOLR
2.0000 mg/kg | Freq: Once | INTRAVENOUS | Status: AC
  Administered 2021-03-05: 08:00:00 105 mg via INTRAVENOUS
  Filled 2021-03-05: qty 5

## 2021-03-05 MED ORDER — DIPHENHYDRAMINE HCL 50 MG/ML IJ SOLN
25.0000 mg | Freq: Once | INTRAMUSCULAR | Status: AC
  Administered 2021-03-05: 08:00:00 25 mg via INTRAVENOUS
  Filled 2021-03-05: qty 1

## 2021-03-05 MED ORDER — SODIUM CHLORIDE 0.9% FLUSH
10.0000 mL | INTRAVENOUS | Status: DC | PRN
  Administered 2021-03-05: 10:00:00 10 mL

## 2021-03-05 MED ORDER — SODIUM CHLORIDE 0.9 % IV SOLN: Freq: Once | INTRAVENOUS | Status: AC

## 2021-03-05 MED ORDER — PACLITAXEL PROTEIN-BOUND CHEMO INJECTION 100 MG
100.0000 mg/m2 | Freq: Once | INTRAVENOUS | Status: AC
  Administered 2021-03-05: 09:00:00 150 mg via INTRAVENOUS
  Filled 2021-03-05: qty 30

## 2021-03-05 NOTE — Patient Instructions (Signed)
[facility]  Discharge Instructions: Thank you for choosing [facility] to provide your oncology and hematology care.   If you have a lab appointment with the [facility], please go directly to the [facility] and check in at the registration area.   Wear comfortable clothing and clothing appropriate for easy access to any Portacath or PICC line.   We strive to give you quality time with your provider. You may need to reschedule your appointment if you arrive late (15 or more minutes).  Arriving late affects you and other patients whose appointments are after yours.  Also, if you miss three or more appointments without notifying the office, you may be dismissed from the clinic at the providers discretion.      For prescription refill requests, have your pharmacy contact our office and allow 72 hours for refills to be completed.    Today you received the following chemotherapy and/or immunotherapy agents Trastuzumab, Paclitaxel      To help prevent nausea and vomiting after your treatment, we encourage you to take your nausea medication as directed.  BELOW ARE SYMPTOMS THAT SHOULD BE REPORTED IMMEDIATELY: *FEVER GREATER THAN 100.4 F (38 C) OR HIGHER *CHILLS OR SWEATING *NAUSEA AND VOMITING THAT IS NOT CONTROLLED WITH YOUR NAUSEA MEDICATION *UNUSUAL SHORTNESS OF BREATH *UNUSUAL BRUISING OR BLEEDING *URINARY PROBLEMS (pain or burning when urinating, or frequent urination) *BOWEL PROBLEMS (unusual diarrhea, constipation, pain near the anus) TENDERNESS IN MOUTH AND THROAT WITH OR WITHOUT PRESENCE OF ULCERS (sore throat, sores in mouth, or a toothache) UNUSUAL RASH, SWELLING OR PAIN  UNUSUAL VAGINAL DISCHARGE OR ITCHING   Items with * indicate a potential emergency and should be followed up as soon as possible or go to the Emergency Department if any problems should occur.  Please show the CHEMOTHERAPY ALERT CARD or IMMUNOTHERAPY ALERT CARD at  check-in to the Emergency Department and triage nurse.  Should you have questions after your visit or need to cancel or reschedule your appointment, please contact [facility]  Dept: [phone]  and follow the prompts.  Office hours are 8:00 a.m. to 4:30 p.m. Monday - Friday. Please note that voicemails left after 4:00 p.m. may not be returned until the following business day.  We are closed weekends and major holidays. You have access to a nurse at all times for urgent questions. Please call the main number to the clinic Dept: [phone] and follow the prompts.   For any non-urgent questions, you may also contact your provider using MyChart. We now offer e-Visits for anyone 26 and older to request care online for non-urgent symptoms. For details visit [email or web address].   Also download the MyChart app! Go to the app store, search "MyChart", open the app, select [facility], and log in with your MyChart username and password.  Due to Covid, a mask is required upon entering the hospital/clinic. If you do not have a mask, one will be given to you upon arrival. For doctor visits, patients may have 1 support person aged 29 or older with them. For treatment visits, patients cannot have anyone with them due to current Covid guidelines and our immunocompromised population.

## 2021-03-06 ENCOUNTER — Ambulatory Visit: Payer: Managed Care, Other (non HMO)

## 2021-03-06 ENCOUNTER — Other Ambulatory Visit: Payer: Self-pay

## 2021-03-06 ENCOUNTER — Encounter: Payer: Self-pay | Admitting: Hematology

## 2021-03-06 DIAGNOSIS — C50411 Malignant neoplasm of upper-outer quadrant of right female breast: Secondary | ICD-10-CM | POA: Diagnosis not present

## 2021-03-06 DIAGNOSIS — M25612 Stiffness of left shoulder, not elsewhere classified: Secondary | ICD-10-CM

## 2021-03-06 DIAGNOSIS — M25611 Stiffness of right shoulder, not elsewhere classified: Secondary | ICD-10-CM

## 2021-03-06 DIAGNOSIS — Z170 Estrogen receptor positive status [ER+]: Secondary | ICD-10-CM

## 2021-03-06 DIAGNOSIS — R293 Abnormal posture: Secondary | ICD-10-CM

## 2021-03-06 DIAGNOSIS — R600 Localized edema: Secondary | ICD-10-CM

## 2021-03-06 DIAGNOSIS — Z483 Aftercare following surgery for neoplasm: Secondary | ICD-10-CM

## 2021-03-06 NOTE — Therapy (Signed)
[facility] [place], [place], [address] Phone: [phone]   Fax:  [phone]  Physical Therapy Treatment  Patient Details  Name: Dana Kramer MRN: [id number] Date of Birth: 1965-03-27 Referring Provider (PT): Dr. Erroll Luna   Encounter Date: 03/06/2021   PT End of Session - 03/06/21 1002     Visit Number 4    Number of Visits 10    Date for PT Re-Evaluation 03/20/21    PT Start Time 0903    PT Stop Time 0956    PT Time Calculation (min) 53 min    Activity Tolerance Patient tolerated treatment well    Behavior During Therapy [facility] for tasks assessed/performed             Past Medical History:  Diagnosis Date   Allergy 11/1998   Penicillin   Chicken pox    History of transfusion of whole blood    Hyperlipidemia    Malignant neoplasm of upper-outer quadrant of right breast in female, estrogen receptor positive ([place]) 01/13/2021   Migraine    Thyroid disease    Hypothyroidism    Past Surgical History:  Procedure Laterality Date   ABDOMINOPLASTY  10/01/2010   CESAREAN SECTION     twice   HERNIA REPAIR     PORTACATH PLACEMENT Right 01/23/2021   Procedure: Right internal jugular 8 [place] PowerPort with C arm and ultrasound guidance;  Surgeon: Erroll Luna, MD;  Location: [facility];  Service: General;  Laterality: Right;   SENTINEL NODE BIOPSY Right 01/23/2021   Procedure: Right axillary sentinel lymph node mapping using Mag Trace;  Surgeon: Erroll Luna, MD;  Location: [facility];  Service: General;  Laterality: Right;   SIMPLE MASTECTOMY WITH AXILLARY SENTINEL NODE BIOPSY Bilateral 01/23/2021   Procedure: Right simple mastectomy ;  Left risk reducing simple mastectomy;  Surgeon: Erroll Luna, MD;  Location: [facility];  Service: General;  Laterality: Bilateral;   TUBAL LIGATION     umblica hernia repair  41/74/0814    There were no vitals filed for this visit.   Subjective Assessment - 03/06/21  0903     Subjective Had chemo yesterday and I am doing OK.  My right arm feels funny at the medial arm and it just started today. Getting dressed better, folding clothes better, making bed. I had some spasm in my left chest so I took a flexeril today before coming.    Pertinent History Patient was diagnosed on 12/10/2020 with right grade II invasive ductal carcinoma breast cancer. She underwent a bilateral mastectomy with a right sentinel node biopsy (4 negative nodes) on 01/23/2021. It is ER/PR positive and HER2 negative with a Ki67 of 10%.    Patient Stated Goals Regain shoulder function    Currently in Pain? No/denies    Pain Score 0-No pain                               [facility] Adult PT Treatment/Exercise - 03/06/21 0001       Manual Therapy   Soft tissue mobilization Bilateral pecs and lats    Myofascial Release Right axillary/upper arm area of cording    Passive ROM PROM to bilateral shoulders with VC's to relax                     PT Education - 03/06/21 1001     Education Details supine wand  flexion and scaption, stargazer    Person(s) Educated Patient    Methods Explanation;Handout    Comprehension Returned demonstration                 PT Long Term Goals - 02/20/21 1409       PT LONG TERM GOAL #1   Title Patient will demonstrate she has regained full shoulder ROM and function post operatively compared to baselines.    Time 4    Period Weeks    Status On-going    Target Date 03/20/21      PT LONG TERM GOAL #2   Title Patient will increase bilateral shoulder flexion to >/= 150 degrees for increased ease reaching.    Time 4    Period Weeks    Status New    Target Date 03/20/21      PT LONG TERM GOAL #3   Title Patient will increase bil shoulder abduction to >/= 160 degrees for increased ease with normal daily tasks and work tasks.    Time 4    Period Weeks    Status New    Target Date 03/20/21      PT LONG TERM GOAL #4    Title Patient will be independent with her HEP for shoulder ROM.    Time 4    Period Weeks    Status New    Target Date 03/20/21      PT LONG TERM GOAL #5   Title Patient will verbalize good understanding of lymphedema risk reduction practices after attending the After Breast Cancer Class.    Time 4    Period Weeks    Status New    Target Date 03/20/21                   Plan - 03/06/21 1003     Clinical Impression Statement Therapy consisted of soft tissue mobilization, AAROM, MFR to right axillary areas of cording and PROM.  Pt was very guarded initially on the left but with talking to her while performing she relaxed and had full PROM for abd, and near full for flexion.  Good tolerance for ER at 50 degrees abd.  nodules noted in axillary border of pecs with tightness, but lats felt good.  updated HEp with supine wand for flexion and scaption, and emphasized improtance of stargazer stretch.  Pt did very well over. Near full PROM on right.    Personal Factors and Comorbidities Age    Stability/Clinical Decision Making Stable/Uncomplicated    Clinical Decision Making Low    Rehab Potential Excellent    PT Frequency 2x / week    PT Duration 4 weeks    PT Treatment/Interventions ADLs/Self Care Home Management;Therapeutic exercise;Patient/family education;Manual techniques;Manual lymph drainage;Passive range of motion;Scar mobilization;Therapeutic activities    PT Next Visit Plan PROM bil shoulders and AAROM exercises may be ready for pulleys, MLD as needed, STM prn    PT Home Exercise Plan Post op HEP, supine wand flexion and scaption    Consulted and Agree with Plan of Care Patient    Family Member Consulted daughter             Patient will benefit from skilled therapeutic intervention in order to improve the following deficits and impairments:  Postural dysfunction, Decreased range of motion, Decreased knowledge of precautions, Impaired UE functional use, Pain, Increased  edema, Decreased scar mobility, Increased fascial restricitons  Visit Diagnosis: Malignant neoplasm of upper-outer quadrant of right breast in  female, estrogen receptor positive ([place])  Abnormal posture  Stiffness of left shoulder, not elsewhere classified  Stiffness of right shoulder, not elsewhere classified  Localized edema  Aftercare following surgery for neoplasm     Problem List Patient Active Problem List   Diagnosis Date Noted   Port-A-Cath in place 03/04/2021   Genetic testing 01/27/2021   S/P mastectomy, bilateral 01/23/2021   Family history of colon cancer 01/15/2021   Malignant neoplasm of upper-outer quadrant of right breast in female, estrogen receptor positive ([place]) 01/13/2021   Pre-diabetes 11/06/2020   Acquired hypothyroidism 04/14/2018   Migraine 01/18/2015   Insomnia 11/05/2011   Anxiety 11/05/2011   Hyperlipidemia 11/05/2011   Tachycardia 78/97/8478   Umbilical hernia 41/28/2081    Claris Pong, PT 03/06/2021, 10:07 AM  [facility] @ [place], [place], [address] Phone: [phone]   Fax:  [phone]  Name: Dana Kramer MRN: [id number] Date of Birth: [birth year]

## 2021-03-06 NOTE — Patient Instructions (Signed)
SHOULDER: Flexion - Supine (Cane)        Cancer Rehab [phone]     Hands shoulder width apart; 5 times 5 seconds, 2x/day  Hands slightly wider than shoulder width; V position   Shoulder Blade Stretch    Clasp fingers behind head with elbows touching in front of face. Pull elbows back while pressing shoulder blades together. Relax and hold as tolerated, can place pillow under elbow here for comfort as needed and to allow for prolonged stretch.  Repeat __5__ times. Do __1-2__ sessions per day.

## 2021-03-07 ENCOUNTER — Ambulatory Visit: Payer: Managed Care, Other (non HMO) | Admitting: Physical Therapy

## 2021-03-07 ENCOUNTER — Encounter: Payer: Self-pay | Admitting: Physical Therapy

## 2021-03-07 DIAGNOSIS — M25611 Stiffness of right shoulder, not elsewhere classified: Secondary | ICD-10-CM

## 2021-03-07 DIAGNOSIS — Z170 Estrogen receptor positive status [ER+]: Secondary | ICD-10-CM

## 2021-03-07 DIAGNOSIS — C50411 Malignant neoplasm of upper-outer quadrant of right female breast: Secondary | ICD-10-CM | POA: Diagnosis not present

## 2021-03-07 DIAGNOSIS — R293 Abnormal posture: Secondary | ICD-10-CM

## 2021-03-07 NOTE — Therapy (Signed)
[facility] @ [place], [place], [address] Phone: [phone]   Fax:  [phone]  Physical Therapy Treatment  Patient Details  Name: Dana Kramer MRN: [id number] Date of Birth: April 28, 1965 Referring Provider (PT): Dr. Erroll Luna   Encounter Date: 03/07/2021   PT End of Session - 03/07/21 0850     Visit Number 5    Number of Visits 10    Date for PT Re-Evaluation 03/20/21    PT Start Time 0845    PT Stop Time 0928    PT Time Calculation (min) 43 min    Activity Tolerance Patient tolerated treatment well    Behavior During Therapy [facility] for tasks assessed/performed             Past Medical History:  Diagnosis Date   Allergy 11/1998   Penicillin   Chicken pox    History of transfusion of whole blood    Hyperlipidemia    Malignant neoplasm of upper-outer quadrant of right breast in female, estrogen receptor positive ([place]) 01/13/2021   Migraine    Thyroid disease    Hypothyroidism    Past Surgical History:  Procedure Laterality Date   ABDOMINOPLASTY  10/01/2010   CESAREAN SECTION     twice   HERNIA REPAIR     PORTACATH PLACEMENT Right 01/23/2021   Procedure: Right internal jugular 8 [place] PowerPort with C arm and ultrasound guidance;  Surgeon: Erroll Luna, MD;  Location: [facility];  Service: General;  Laterality: Right;   SENTINEL NODE BIOPSY Right 01/23/2021   Procedure: Right axillary sentinel lymph node mapping using Mag Trace;  Surgeon: Erroll Luna, MD;  Location: [facility];  Service: General;  Laterality: Right;   SIMPLE MASTECTOMY WITH AXILLARY SENTINEL NODE BIOPSY Bilateral 01/23/2021   Procedure: Right simple mastectomy ;  Left risk reducing simple mastectomy;  Surgeon: Erroll Luna, MD;  Location: [facility];  Service: General;  Laterality: Bilateral;   TUBAL LIGATION     umblica hernia repair  00/76/2263    There were no vitals filed for this visit.   Subjective Assessment - 03/07/21  0932     Subjective The massage really loosened me up! I can do things better.    Patient is accompained by: Family member    Pertinent History Patient was diagnosed on 12/10/2020 with right grade II invasive ductal carcinoma breast cancer. She underwent a bilateral mastectomy with a right sentinel node biopsy (4 negative nodes) on 01/23/2021. It is ER/PR positive and HER2 negative with a Ki67 of 10%.    Currently in Pain? No/denies    Multiple Pain Sites No                [facility] PT Assessment - 03/07/21 0001       AROM   Right Shoulder Flexion 150 Degrees    Right Shoulder ABduction 147 Degrees    Left Shoulder Flexion 144 Degrees    Left Shoulder ABduction 140 Degrees                           [facility] Adult PT Treatment/Exercise - 03/07/21 0001       Manual Therapy   Manual therapy comments TC to lateral and post ribs: had pt take deep breaths into my hands 6x in each position.    Myofascial Release LT pectoral, Bil lateral trunk    Passive ROM PROM to bilateral shoulders with VC's to relax  Lt > RT                         PT Long Term Goals - 02/20/21 1409       PT LONG TERM GOAL #1   Title Patient will demonstrate she has regained full shoulder ROM and function post operatively compared to baselines.    Time 4    Period Weeks    Status On-going    Target Date 03/20/21      PT LONG TERM GOAL #2   Title Patient will increase bilateral shoulder flexion to >/= 150 degrees for increased ease reaching.    Time 4    Period Weeks    Status New    Target Date 03/20/21      PT LONG TERM GOAL #3   Title Patient will increase bil shoulder abduction to >/= 160 degrees for increased ease with normal daily tasks and work tasks.    Time 4    Period Weeks    Status New    Target Date 03/20/21      PT LONG TERM GOAL #4   Title Patient will be independent with her HEP for shoulder ROM.    Time 4    Period Weeks    Status New    Target  Date 03/20/21      PT LONG TERM GOAL #5   Title Patient will verbalize good understanding of lymphedema risk reduction practices after attending the After Breast Cancer Class.    Time 4    Period Weeks    Status New    Target Date 03/20/21                   Plan - 03/07/21 8887     Clinical Impression Statement Pt arrives reporting her motion and "arm" feel so much better "less tight" after the massage from her previous appt. Pt tolerated PROm excellent on Bil shoulders: LT > RT and some soft tissue to lateral ribs and pectoral insertion release. All AROm measurements improved significantly since eval.    Personal Factors and Comorbidities Age    Stability/Clinical Decision Making Stable/Uncomplicated    Rehab Potential Excellent    PT Frequency 2x / week    PT Duration 4 weeks    PT Treatment/Interventions ADLs/Self Care Home Management;Therapeutic exercise;Patient/family education;Manual techniques;Manual lymph drainage;Passive range of motion;Scar mobilization;Therapeutic activities    PT Next Visit Plan PROM bil shoulders and AAROM exercises may be ready for pulleys, MLD as needed, STM prn    Consulted and Agree with Plan of Care Patient    Family Member Consulted daughter             Patient will benefit from skilled therapeutic intervention in order to improve the following deficits and impairments:  Postural dysfunction, Decreased range of motion, Decreased knowledge of precautions, Impaired UE functional use, Pain, Increased edema, Decreased scar mobility, Increased fascial restricitons  Visit Diagnosis: Malignant neoplasm of upper-outer quadrant of right breast in female, estrogen receptor positive ([place])  Abnormal posture  Stiffness of right shoulder, not elsewhere classified     Problem List Patient Active Problem List   Diagnosis Date Noted   Port-A-Cath in place 03/04/2021   Genetic testing 01/27/2021   S/P mastectomy, bilateral 01/23/2021   Family  history of colon cancer 01/15/2021   Malignant neoplasm of upper-outer quadrant of right breast in female, estrogen receptor positive ([place]) 01/13/2021   Pre-diabetes 11/06/2020  Acquired hypothyroidism 04/14/2018   Migraine 01/18/2015   Insomnia 11/05/2011   Anxiety 11/05/2011   Hyperlipidemia 11/05/2011   Tachycardia 56/15/3794   Umbilical hernia 32/76/1470    Evanee Lubrano, PTA 03/07/2021, 9:34 AM  [facility] @ [place], [place], [address] Phone: [phone]   Fax:  [phone]  Name: Shakeia Krus MRN: [id number] Date of Birth: [birth year]

## 2021-03-11 ENCOUNTER — Inpatient Hospital Stay: Payer: Managed Care, Other (non HMO)

## 2021-03-11 ENCOUNTER — Inpatient Hospital Stay: Payer: Managed Care, Other (non HMO) | Attending: Hematology

## 2021-03-11 ENCOUNTER — Other Ambulatory Visit: Payer: Self-pay

## 2021-03-11 VITALS — BP 139/96 | HR 93 | Temp 98.0°F | Resp 17 | Wt 127.5 lb

## 2021-03-11 DIAGNOSIS — Z5111 Encounter for antineoplastic chemotherapy: Secondary | ICD-10-CM | POA: Insufficient documentation

## 2021-03-11 DIAGNOSIS — Z170 Estrogen receptor positive status [ER+]: Secondary | ICD-10-CM

## 2021-03-11 DIAGNOSIS — C50411 Malignant neoplasm of upper-outer quadrant of right female breast: Secondary | ICD-10-CM | POA: Insufficient documentation

## 2021-03-11 DIAGNOSIS — Z79899 Other long term (current) drug therapy: Secondary | ICD-10-CM | POA: Diagnosis not present

## 2021-03-11 DIAGNOSIS — Z95828 Presence of other vascular implants and grafts: Secondary | ICD-10-CM

## 2021-03-11 DIAGNOSIS — Z5112 Encounter for antineoplastic immunotherapy: Secondary | ICD-10-CM | POA: Insufficient documentation

## 2021-03-11 LAB — CBC WITH DIFFERENTIAL (CANCER CENTER ONLY)
Abs Immature Granulocytes: 0.01 10*3/uL (ref 0.00–0.07)
Basophils Absolute: 0 10*3/uL (ref 0.0–0.1)
Basophils Relative: 1 %
Eosinophils Absolute: 0.1 10*3/uL (ref 0.0–0.5)
Eosinophils Relative: 2 %
HCT: 34.4 % — ABNORMAL LOW (ref 36.0–46.0)
Hemoglobin: 11.3 g/dL — ABNORMAL LOW (ref 12.0–15.0)
Immature Granulocytes: 0 %
Lymphocytes Relative: 46 %
Lymphs Abs: 2.1 10*3/uL (ref 0.7–4.0)
MCH: 27.9 pg (ref 26.0–34.0)
MCHC: 32.8 g/dL (ref 30.0–36.0)
MCV: 84.9 fL (ref 80.0–100.0)
Monocytes Absolute: 0.2 10*3/uL (ref 0.1–1.0)
Monocytes Relative: 5 %
Neutro Abs: 2.1 10*3/uL (ref 1.7–7.7)
Neutrophils Relative %: 46 %
Platelet Count: 226 10*3/uL (ref 150–400)
RBC: 4.05 MIL/uL (ref 3.87–5.11)
RDW: 11.8 % (ref 11.5–15.5)
WBC Count: 4.6 10*3/uL (ref 4.0–10.5)
nRBC: 0 % (ref 0.0–0.2)

## 2021-03-11 LAB — CMP (CANCER CENTER ONLY)
ALT: 13 U/L (ref 0–44)
AST: 26 U/L (ref 15–41)
Albumin: 4.3 g/dL (ref 3.5–5.0)
Alkaline Phosphatase: 57 U/L (ref 38–126)
Anion gap: 10 (ref 5–15)
BUN: 11 mg/dL (ref 6–20)
CO2: 23 mmol/L (ref 22–32)
Calcium: 9.1 mg/dL (ref 8.9–10.3)
Chloride: 107 mmol/L (ref 98–111)
Creatinine: 0.74 mg/dL (ref 0.44–1.00)
GFR, Estimated: >60 mL/min (ref >60)
Glucose, Bld: 126 mg/dL — ABNORMAL HIGH (ref 70–99)
Potassium: 4 mmol/L (ref 3.5–5.1)
Sodium: 140 mmol/L (ref 135–145)
Total Bilirubin: 0.3 mg/dL (ref 0.3–1.2)
Total Protein: 7.1 g/dL (ref 6.5–8.1)

## 2021-03-11 MED ORDER — SODIUM CHLORIDE 0.9% FLUSH
10.0000 mL | INTRAVENOUS | Status: DC | PRN
  Administered 2021-03-11: 10 mL

## 2021-03-11 MED ORDER — ACETAMINOPHEN 325 MG PO TABS
650.0000 mg | ORAL_TABLET | Freq: Once | ORAL | Status: AC
  Administered 2021-03-11: 650 mg via ORAL
  Filled 2021-03-11: qty 2

## 2021-03-11 MED ORDER — PACLITAXEL PROTEIN-BOUND CHEMO INJECTION 100 MG
100.0000 mg/m2 | Freq: Once | INTRAVENOUS | Status: AC
  Administered 2021-03-11: 150 mg via INTRAVENOUS
  Filled 2021-03-11: qty 30

## 2021-03-11 MED ORDER — TRASTUZUMAB-ANNS CHEMO 150 MG IV SOLR
2.0000 mg/kg | Freq: Once | INTRAVENOUS | Status: AC
  Administered 2021-03-11: 105 mg via INTRAVENOUS
  Filled 2021-03-11: qty 5

## 2021-03-11 MED ORDER — SODIUM CHLORIDE 0.9% FLUSH
10.0000 mL | Freq: Once | INTRAVENOUS | Status: AC
  Administered 2021-03-11: 10 mL

## 2021-03-11 MED ORDER — SODIUM CHLORIDE 0.9 % IV SOLN: Freq: Once | INTRAVENOUS | Status: AC

## 2021-03-11 MED ORDER — HEPARIN SOD (PORK) LOCK FLUSH 100 UNIT/ML IV SOLN
500.0000 [IU] | Freq: Once | INTRAVENOUS | Status: AC | PRN
  Administered 2021-03-11: 500 [IU]

## 2021-03-11 MED ORDER — DIPHENHYDRAMINE HCL 50 MG/ML IJ SOLN
25.0000 mg | Freq: Once | INTRAMUSCULAR | Status: AC
  Administered 2021-03-11: 25 mg via INTRAVENOUS
  Filled 2021-03-11: qty 1

## 2021-03-11 NOTE — Patient Instructions (Signed)
[facility] ONCOL[facility]  Discharge Instructions: Thank you for choosing [facility] to provide your oncology and hematology care.   If you have a lab appointment with the [facility], please go directly to the [facility] and check in at the registration area.   Wear comfortable clothing and clothing appropriate for easy access to any Portacath or PICC line.   We strive to give you quality time with your provider. You may need to reschedule your appointment if you arrive late (15 or more minutes).  Arriving late affects you and other patients whose appointments are after yours.  Also, if you miss three or more appointments without notifying the office, you may be dismissed from the clinic at the providers discretion.      For prescription refill requests, have your pharmacy contact our office and allow 72 hours for refills to be completed.    Today you received the following chemotherapy and/or immunotherapy agents: Trastuzumab and Abraxane      To help prevent nausea and vomiting after your treatment, we encourage you to take your nausea medication as directed.  BELOW ARE SYMPTOMS THAT SHOULD BE REPORTED IMMEDIATELY: *FEVER GREATER THAN 100.4 F (38 C) OR HIGHER *CHILLS OR SWEATING *NAUSEA AND VOMITING THAT IS NOT CONTROLLED WITH YOUR NAUSEA MEDICATION *UNUSUAL SHORTNESS OF BREATH *UNUSUAL BRUISING OR BLEEDING *URINARY PROBLEMS (pain or burning when urinating, or frequent urination) *BOWEL PROBLEMS (unusual diarrhea, constipation, pain near the anus) TENDERNESS IN MOUTH AND THROAT WITH OR WITHOUT PRESENCE OF ULCERS (sore throat, sores in mouth, or a toothache) UNUSUAL RASH, SWELLING OR PAIN  UNUSUAL VAGINAL DISCHARGE OR ITCHING   Items with * indicate a potential emergency and should be followed up as soon as possible or go to the Emergency Department if any problems should occur.  Please show the CHEMOTHERAPY ALERT CARD or IMMUNOTHERAPY ALERT CARD  at check-in to the Emergency Department and triage nurse.  Should you have questions after your visit or need to cancel or reschedule your appointment, please contact [facility]  Dept: [phone]  and follow the prompts.  Office hours are 8:00 a.m. to 4:30 p.m. Monday - Friday. Please note that voicemails left after 4:00 p.m. may not be returned until the following business day.  We are closed weekends and major holidays. You have access to a nurse at all times for urgent questions. Please call the main number to the clinic Dept: [phone] and follow the prompts.   For any non-urgent questions, you may also contact your provider using MyChart. We now offer e-Visits for anyone 14 and older to request care online for non-urgent symptoms. For details visit [email or web address].   Also download the MyChart app! Go to the app store, search "MyChart", open the app, select [facility], and log in with your MyChart username and password.  Due to Covid, a mask is required upon entering the hospital/clinic. If you do not have a mask, one will be given to you upon arrival. For doctor visits, patients may have 1 support person aged 57 or older with them. For treatment visits, patients cannot have anyone with them due to current Covid guidelines and our immunocompromised population.

## 2021-03-12 ENCOUNTER — Ambulatory Visit: Payer: Managed Care, Other (non HMO) | Attending: Surgery

## 2021-03-12 DIAGNOSIS — Z483 Aftercare following surgery for neoplasm: Secondary | ICD-10-CM | POA: Diagnosis present

## 2021-03-12 DIAGNOSIS — Z170 Estrogen receptor positive status [ER+]: Secondary | ICD-10-CM | POA: Diagnosis present

## 2021-03-12 DIAGNOSIS — M25612 Stiffness of left shoulder, not elsewhere classified: Secondary | ICD-10-CM | POA: Diagnosis present

## 2021-03-12 DIAGNOSIS — M25611 Stiffness of right shoulder, not elsewhere classified: Secondary | ICD-10-CM | POA: Diagnosis present

## 2021-03-12 DIAGNOSIS — R600 Localized edema: Secondary | ICD-10-CM | POA: Diagnosis present

## 2021-03-12 DIAGNOSIS — R293 Abnormal posture: Secondary | ICD-10-CM | POA: Diagnosis present

## 2021-03-12 DIAGNOSIS — C50411 Malignant neoplasm of upper-outer quadrant of right female breast: Secondary | ICD-10-CM | POA: Diagnosis present

## 2021-03-12 NOTE — Therapy (Signed)
[facility] @ [place], [place], [address] Phone: [phone]   Fax:  [phone]  Physical Therapy Treatment  Patient Details  Name: Dana Kramer MRN: [id number] Date of Birth: 02-19-1966 Referring Provider (PT): Dr. Erroll Luna   Encounter Date: 03/12/2021   PT End of Session - 03/12/21 0913     Visit Number 6    Number of Visits 10    Date for PT Re-Evaluation 03/20/21    PT Start Time 0904    PT Stop Time 1003    PT Time Calculation (min) 59 min    Activity Tolerance Patient tolerated treatment well    Behavior During Therapy [facility] for tasks assessed/performed             Past Medical History:  Diagnosis Date   Allergy 11/1998   Penicillin   Chicken pox    History of transfusion of whole blood    Hyperlipidemia    Malignant neoplasm of upper-outer quadrant of right breast in female, estrogen receptor positive ([place]) 01/13/2021   Migraine    Thyroid disease    Hypothyroidism    Past Surgical History:  Procedure Laterality Date   ABDOMINOPLASTY  10/01/2010   CESAREAN SECTION     twice   HERNIA REPAIR     PORTACATH PLACEMENT Right 01/23/2021   Procedure: Right internal jugular 8 [place] PowerPort with C arm and ultrasound guidance;  Surgeon: Erroll Luna, MD;  Location: [facility];  Service: General;  Laterality: Right;   SENTINEL NODE BIOPSY Right 01/23/2021   Procedure: Right axillary sentinel lymph node mapping using Mag Trace;  Surgeon: Erroll Luna, MD;  Location: [facility];  Service: General;  Laterality: Right;   SIMPLE MASTECTOMY WITH AXILLARY SENTINEL NODE BIOPSY Bilateral 01/23/2021   Procedure: Right simple mastectomy ;  Left risk reducing simple mastectomy;  Surgeon: Erroll Luna, MD;  Location: [facility];  Service: General;  Laterality: Bilateral;   TUBAL LIGATION     umblica hernia repair  67/59/1638    There were no vitals filed for this visit.   Subjective Assessment - 03/12/21 0908      Subjective I took my muscle relaxer this morning so I'm feeling good. I do feel like overall my shoulders are getting better.    Pertinent History Patient was diagnosed on 12/10/2020 with right grade II invasive ductal carcinoma breast cancer. She underwent a bilateral mastectomy with a right sentinel node biopsy (4 negative nodes) on 01/23/2021. It is ER/PR positive and HER2 negative with a Ki67 of 10%.    Patient Stated Goals Regain shoulder function    Currently in Pain? No/denies                               [facility] Adult PT Treatment/Exercise - 03/12/21 0001       Shoulder Exercises: Pulleys   Flexion 2 minutes    Flexion Limitations Pt able to return good demo and reduction of Lt scapular compensation after cued    ABduction 2 minutes    ABduction Limitations Same as flexion      Shoulder Exercises: Therapy Ball   Flexion Both;10 reps   Forward lean into end of stretch   Flexion Limitations Pt returned therapist demo    ABduction Right;Left;5 reps   same side lean into end of stretch     Manual Therapy   Myofascial Release In Supine to Lt pectoralis  and at insertion where palpably very tight; also along lateral trunk    Passive ROM In Supine to Rt>Lt shoulder into flexion, abduction and D2 with svcapular depression throughout to decrease muscle guarding                          PT Long Term Goals - 02/20/21 1409       PT LONG TERM GOAL #1   Title Patient will demonstrate she has regained full shoulder ROM and function post operatively compared to baselines.    Time 4    Period Weeks    Status On-going    Target Date 03/20/21      PT LONG TERM GOAL #2   Title Patient will increase bilateral shoulder flexion to >/= 150 degrees for increased ease reaching.    Time 4    Period Weeks    Status New    Target Date 03/20/21      PT LONG TERM GOAL #3   Title Patient will increase bil shoulder abduction to >/= 160 degrees for increased  ease with normal daily tasks and work tasks.    Time 4    Period Weeks    Status New    Target Date 03/20/21      PT LONG TERM GOAL #4   Title Patient will be independent with her HEP for shoulder ROM.    Time 4    Period Weeks    Status New    Target Date 03/20/21      PT LONG TERM GOAL #5   Title Patient will verbalize good understanding of lymphedema risk reduction practices after attending the After Breast Cancer Class.    Time 4    Period Weeks    Status New    Target Date 03/20/21                   Plan - 03/12/21 0913     Clinical Impression Statement Progressed pt to include AA/ROM stretches today which she did very well with reporting feeling good stretches with each. She was able to return correct demo as well after instruction for how to decrease Lt scapular compensation. Then continued with manual therapy working to improve her Lt UE ROM and decreasing fascial restrictions at Lt upper quadrant, all to pts tolerance.    Personal Factors and Comorbidities Age    Stability/Clinical Decision Making Stable/Uncomplicated    Rehab Potential Excellent    PT Frequency 2x / week    PT Duration 4 weeks    PT Treatment/Interventions ADLs/Self Care Home Management;Therapeutic exercise;Patient/family education;Manual techniques;Manual lymph drainage;Passive range of motion;Scar mobilization;Therapeutic activities    PT Next Visit Plan PROM bil shoulders and AAROM exercises, cont pulleys and ball roll up wall, MLD as needed, STM prn    PT Home Exercise Plan Post op HEP, supine wand flexion and scaption    Consulted and Agree with Plan of Care Patient             Patient will benefit from skilled therapeutic intervention in order to improve the following deficits and impairments:  Postural dysfunction, Decreased range of motion, Decreased knowledge of precautions, Impaired UE functional use, Pain, Increased edema, Decreased scar mobility, Increased fascial  restricitons  Visit Diagnosis: Malignant neoplasm of upper-outer quadrant of right breast in female, estrogen receptor positive ([facility])  Abnormal posture  Stiffness of right shoulder, not elsewhere classified  Stiffness of left shoulder, not elsewhere classified  Localized edema  Aftercare following surgery for neoplasm     Problem List Patient Active Problem List   Diagnosis Date Noted   Port-A-Cath in place 03/04/2021   Genetic testing 01/27/2021   S/P mastectomy, bilateral 01/23/2021   Family history of colon cancer 01/15/2021   Malignant neoplasm of upper-outer quadrant of right breast in female, estrogen receptor positive ([facility]) 01/13/2021   Pre-diabetes 11/06/2020   Acquired hypothyroidism 04/14/2018   Migraine 01/18/2015   Insomnia 11/05/2011   Anxiety 11/05/2011   Hyperlipidemia 11/05/2011   Tachycardia 36/14/4315   Umbilical hernia 40/10/6759    Otelia Limes, PTA 03/12/2021, 10:07 AM  [facility] @ [place], [place], [address] Phone: [phone]   Fax:  [phone]  Name: Dana Kramer MRN: [id number] Date of Birth: 06-14-1965

## 2021-03-14 NOTE — Telephone Encounter (Signed)
Hi [facility] I spoke with her about this and we have the form. I will get to it as soon as possible. Thanks!

## 2021-03-15 NOTE — Progress Notes (Signed)
[facility] at [facility] [place], [place], [place] [address] [phone]  Date:  03/17/2021   Name:  Dana Kramer   DOB:  February 09, 1966   MRN:  [id number]  PCP:  Darreld Mclean, MD    Chief Complaint: Follow-up (BP meds/Concerns/ questions: none)   History of Present Illness:  Dana Kramer is a 56 y.o. 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  She gets so tired after the treatment and is feeling more emotional, but overall she thinks she is doing okay.  She did have to stop taking her SSRI due to chemotherapy, she is aware this may increase emotional feelings-however she is safe, certainly no suicidal ideation She will need to do oophorectomy as well next year  She plans to not have a reconstruction at least at this time   She contacted me over Christmas with concern of her blood pressure running 160/100 We had increased her dose of metoprolol from 25-50, but blood pressures continue to run high. She was currently taking metoprolol 50, I added amlodipine 5 which does seem to have helped  She notes that her BP is much better, but she has mild tachycardia still No swelling in her ankles   Patient Active Problem List   Diagnosis Date Noted   Port-A-Cath in place 03/04/2021   Genetic testing 01/27/2021   S/P mastectomy,  bilateral 01/23/2021   Family history of colon cancer 01/15/2021   Malignant neoplasm of upper-outer quadrant of right breast in female, estrogen receptor positive ([facility]) 01/13/2021   Pre-diabetes 11/06/2020   Acquired hypothyroidism 04/14/2018   Migraine 01/18/2015   Insomnia 11/05/2011   Anxiety 11/05/2011   Hyperlipidemia 11/05/2011   Tachycardia 35/00/9381   Umbilical hernia 82/99/3716    Past Medical History:  Diagnosis Date   Allergy 11/1998   Penicillin   Chicken pox    History of transfusion of whole blood    Hyperlipidemia    Malignant neoplasm of upper-outer quadrant of right breast in female, estrogen receptor positive ([facility]) 01/13/2021   Migraine    Thyroid disease    Hypothyroidism    Past Surgical History:  Procedure Laterality Date   ABDOMINOPLASTY  10/01/2010   CESAREAN SECTION     twice   HERNIA REPAIR     PORTACATH PLACEMENT Right 01/23/2021   Procedure: Right internal jugular 8 [place] PowerPort with C arm and ultrasound guidance;  Surgeon: Erroll Luna, MD;  Location: [facility];  Service: General;  Laterality: Right;   SENTINEL NODE BIOPSY Right 01/23/2021   Procedure: Right axillary sentinel lymph node mapping using Mag Trace;  Surgeon: Erroll Luna, MD;  Location: [facility];  Service: General;  Laterality: Right;   SIMPLE MASTECTOMY WITH AXILLARY SENTINEL NODE BIOPSY Bilateral 01/23/2021   Procedure: Right simple mastectomy ;  Left risk reducing simple mastectomy;  Surgeon: Erroll Luna,  MD;  Location: [facility];  Service: General;  Laterality: Bilateral;   TUBAL LIGATION     umblica hernia repair  09/32/3557    Social History   Tobacco Use   Smoking status: Never   Smokeless tobacco: Never  Vaping Use   Vaping Use: Never used  Substance Use Topics   Alcohol use: Never   Drug use: Never    Family History  Problem Relation Age of Onset   Hypertension Mother    Cancer Father 21       esophageal cancer   Leukemia Maternal Grandfather        dx.  >50   Colon cancer Paternal Grandfather        dx. >50   Lung cancer Cousin        dx. 7s, did not smoke    Allergies  Allergen Reactions   Penicillins Rash    Mainly upper body only.    Medication list has been reviewed and updated.  Current Outpatient Medications on File Prior to Visit  Medication Sig Dispense Refill   amLODipine (NORVASC) 5 MG tablet Take 1 tablet (5 mg total) by mouth daily. 90 tablet 3   b complex vitamins capsule Take 1 capsule by mouth daily.     Calcium Citrate-Vitamin D (CALCIUM + D PO) Take 1 tablet by mouth daily.     Cholecalciferol (VITAMIN D PO) Take 1 tablet by mouth daily.     cyclobenzaprine (FLEXERIL) 5 MG tablet Take 1 tablet (5 mg total) by mouth 3 (three) times daily as needed for muscle spasms. 10 tablet 0   ibuprofen (ADVIL) 800 MG tablet Take 1 tablet (800 mg total) by mouth every 8 (eight) hours as needed. 30 tablet 0   Ibuprofen-diphenhydrAMINE HCl (ADVIL PM) 200-25 MG CAPS Take 2 tablets by mouth daily as needed (pain).     levothyroxine (SYNTHROID) 25 MCG tablet TAKE 1 TABLET(25 MCG) BY MOUTH DAILY BEFORE BREAKFAST 90 tablet 3   lidocaine-prilocaine (EMLA) cream Apply 1 application topically as needed (To be applied to portacath 30 to 60 mins prior to portacath access). 30 g 2   Melatonin 10 MG CAPS Take 10 mg by mouth at bedtime.     metoprolol succinate (TOPROL-XL) 50 MG 24 hr tablet Take 1 tablet (50 mg total) by mouth daily. Take with or immediately following a meal. 90 tablet 3   Multiple Vitamins-Minerals (WOMENS MULTIVITAMIN PO) Take 1 tablet by mouth daily.     omeprazole (PRILOSEC OTC) 20 MG tablet Take 20 mg by mouth daily.     ondansetron (ZOFRAN) 8 MG tablet Take 1 tablet (8 mg total) by mouth 2 (two) times daily as needed (Nausea or vomiting). 30 tablet 1   oxyCODONE (OXY IR/ROXICODONE) 5 MG immediate release tablet Take 1 tablet (5 mg total) by mouth every 6 (six) hours as needed for severe pain. 15 tablet 0    prochlorperazine (COMPAZINE) 10 MG tablet Take 1 tablet (10 mg total) by mouth every 6 (six) hours as needed (Nausea or vomiting). 30 tablet 1   simvastatin (ZOCOR) 20 MG tablet TAKE 1 TABLET(20 MG) BY MOUTH AT BEDTIME 90 tablet 3   No current facility-administered medications on file prior to visit.    Review of Systems:  As per HPI- otherwise negative.   Physical Examination: Vitals:   03/17/21 0906  BP: 120/80  Pulse: (!) 104  Resp: 18  Temp: 97.7 F (36.5 C)  SpO2: 100%   Vitals:   03/17/21  5701  Weight: 124 lb 6.4 oz (56.4 kg)  Height: 5' (1.524 m)   Body mass index is 24.3 kg/m. Ideal Body Weight: Weight in (lb) to have BMI = 25: 127.7  GEN: no acute distress.  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  Ears and Nose: No external deformity. CV: RRR-minimal tachycardia, No M/G/R. No JVD. No thrill. No extra heart sounds. PULM: CTA B, no wheezes, crackles, rhonchi. No retractions. No resp. distress. No accessory muscle use. ABD: S, NT, ND. No rebound. No HSM. EXTR: No c/c/e PSYCH: Normally interactive. Conversant.   EKG: Normal sinus rhythm with heart rate 100 Assessment and Plan: Essential hypertension - Plan: EKG 12-Lead  Tachycardia - Plan: EKG 12-Lead  Seen today for follow-up of elevated blood pressure and tachycardia.  We increased her blood pressure control regimen to metoprolol XL 50 and amlodipine 5; this does seem to be working okay No particular side effects that she has noted We are not certain why her heart rate has increased during her cancer treatment.  Patient notes no palpitations or chest pain.  She did have an echocardiogram which was okay Presumably her heart rate will decrease when she completes treatment, we will continue to monitor  Signed Lamar Blinks, MD

## 2021-03-17 ENCOUNTER — Ambulatory Visit: Payer: Managed Care, Other (non HMO) | Admitting: Family Medicine

## 2021-03-17 ENCOUNTER — Other Ambulatory Visit: Payer: Self-pay

## 2021-03-17 ENCOUNTER — Ambulatory Visit: Payer: Managed Care, Other (non HMO)

## 2021-03-17 VITALS — BP 120/80 | HR 104 | Temp 97.7°F | Resp 18 | Ht 60.0 in | Wt 124.4 lb

## 2021-03-17 DIAGNOSIS — I10 Essential (primary) hypertension: Secondary | ICD-10-CM | POA: Diagnosis not present

## 2021-03-17 DIAGNOSIS — Z483 Aftercare following surgery for neoplasm: Secondary | ICD-10-CM

## 2021-03-17 DIAGNOSIS — Z170 Estrogen receptor positive status [ER+]: Secondary | ICD-10-CM

## 2021-03-17 DIAGNOSIS — R293 Abnormal posture: Secondary | ICD-10-CM

## 2021-03-17 DIAGNOSIS — R000 Tachycardia, unspecified: Secondary | ICD-10-CM

## 2021-03-17 DIAGNOSIS — R600 Localized edema: Secondary | ICD-10-CM

## 2021-03-17 DIAGNOSIS — M25611 Stiffness of right shoulder, not elsewhere classified: Secondary | ICD-10-CM

## 2021-03-17 DIAGNOSIS — C50411 Malignant neoplasm of upper-outer quadrant of right female breast: Secondary | ICD-10-CM | POA: Diagnosis not present

## 2021-03-17 DIAGNOSIS — M25612 Stiffness of left shoulder, not elsewhere classified: Secondary | ICD-10-CM

## 2021-03-17 NOTE — Patient Instructions (Addendum)
Good to see you today- I am sorry you are going through this.  Please let me know if I can help you  We can increase amlodipine to 10 if needed; just keep me posted

## 2021-03-17 NOTE — Therapy (Signed)
[facility] @ [place], [place], [address] Phone: [phone]   Fax:  [phone]  Physical Therapy Treatment  Patient Details  Name: Dana Kramer MRN: [id number] Date of Birth: 03-29-1965 Referring Provider (PT): Dr. Erroll Luna   Encounter Date: 03/17/2021   PT End of Session - 03/17/21 1408     Visit Number 7    Number of Visits 10    Date for PT Re-Evaluation 03/20/21    PT Start Time 6378    PT Stop Time 5885    PT Time Calculation (min) 51 min    Activity Tolerance Patient tolerated treatment well    Behavior During Therapy [facility] for tasks assessed/performed             Past Medical History:  Diagnosis Date   Allergy 11/1998   Penicillin   Chicken pox    History of transfusion of whole blood    Hyperlipidemia    Malignant neoplasm of upper-outer quadrant of right breast in female, estrogen receptor positive ([place]) 01/13/2021   Migraine    Thyroid disease    Hypothyroidism    Past Surgical History:  Procedure Laterality Date   ABDOMINOPLASTY  10/01/2010   CESAREAN SECTION     twice   HERNIA REPAIR     PORTACATH PLACEMENT Right 01/23/2021   Procedure: Right internal jugular 8 [place] PowerPort with C arm and ultrasound guidance;  Surgeon: Erroll Luna, MD;  Location: [facility];  Service: General;  Laterality: Right;   SENTINEL NODE BIOPSY Right 01/23/2021   Procedure: Right axillary sentinel lymph node mapping using Mag Trace;  Surgeon: Erroll Luna, MD;  Location: [facility];  Service: General;  Laterality: Right;   SIMPLE MASTECTOMY WITH AXILLARY SENTINEL NODE BIOPSY Bilateral 01/23/2021   Procedure: Right simple mastectomy ;  Left risk reducing simple mastectomy;  Surgeon: Erroll Luna, MD;  Location: [facility];  Service: General;  Laterality: Bilateral;   TUBAL LIGATION     umblica hernia repair  02/77/4128    There were no vitals filed for this visit.   Subjective Assessment - 03/17/21 1404      Subjective Since I saw you last time my left arm has been so good.  It still feels tight in the axilla but pain is much better   Pertinent History Patient was diagnosed on 12/10/2020 with right grade II invasive ductal carcinoma breast cancer. She underwent a bilateral mastectomy with a right sentinel node biopsy (4 negative nodes) on 01/23/2021. It is ER/PR positive and HER2 negative with a Ki67 of 10%.    Patient Stated Goals Regain shoulder function    Currently in Pain? No/denies    Pain Score 0-No pain                               [facility] Adult PT Treatment/Exercise - 03/17/21 0001       Shoulder Exercises: Pulleys   Flexion 2 minutes    ABduction 2 minutes      Shoulder Exercises: Therapy Ball   Flexion Both;10 reps   Forward lean into end of stretch   ABduction Right;Left;5 reps   same side lean into end of stretch/done without the ball     Manual Therapy   Soft tissue mobilization Left pecs, lats/UT to decrease tissue tension    Myofascial Release In Supine to Lt pectoralis and at insertion where palpably very tight; also along  lateral trunk, and at right axillary region and upper arm area of cording    Manual Lymphatic Drainage (MLD) --    Passive ROM In Supine to bilateral shoulders into flexion, abduction, ER and D2 flex with occasional VC's to relax                          PT Long Term Goals - 02/20/21 1409       PT LONG TERM GOAL #1   Title Patient will demonstrate she has regained full shoulder ROM and function post operatively compared to baselines.    Time 4    Period Weeks    Status On-going    Target Date 03/20/21      PT LONG TERM GOAL #2   Title Patient will increase bilateral shoulder flexion to >/= 150 degrees for increased ease reaching.    Time 4    Period Weeks    Status New    Target Date 03/20/21      PT LONG TERM GOAL #3   Title Patient will increase bil shoulder abduction to >/= 160 degrees for  increased ease with normal daily tasks and work tasks.    Time 4    Period Weeks    Status New    Target Date 03/20/21      PT LONG TERM GOAL #4   Title Patient will be independent with her HEP for shoulder ROM.    Time 4    Period Weeks    Status New    Target Date 03/20/21      PT LONG TERM GOAL #5   Title Patient will verbalize good understanding of lymphedema risk reduction practices after attending the After Breast Cancer Class.    Time 4    Period Weeks    Status New    Target Date 03/20/21                   Plan - 03/17/21 1409     Clinical Impression Statement Continued soft tissue mobilization to left UE/pectoralis and lats, and PROM to bilateral shoulders, as well as MFR techniques to right UE cording.  Pt demonstrates excellent improvement in ROM.  Cording is still present but only mildly limiting with Right shoulder ROM. She was given a script today to get fit for sleeve at [facility]. She will set up ABC class for Jan. 16 as she missed the one she was signed up for.    Personal Factors and Comorbidities --    Stability/Clinical Decision Making Stable/Uncomplicated    Rehab Potential Excellent    PT Frequency 2x / week    PT Duration 4 weeks    PT Treatment/Interventions ADLs/Self Care Home Management;Therapeutic exercise;Patient/family education;Manual techniques;Manual lymph drainage;Passive range of motion;Scar mobilization;Therapeutic activities    PT Next Visit Plan Recert soon,PROM bil shoulders and AAROM exercises, cont pulleys and ball roll up wall, MLD as needed, STM prn    PT Home Exercise Plan Post op HEP, supine wand flexion and scaption    Consulted and Agree with Plan of Care Patient             Patient will benefit from skilled therapeutic intervention in order to improve the following deficits and impairments:  Postural dysfunction, Decreased range of motion, Decreased knowledge of precautions, Impaired UE functional use, Pain,  Increased edema, Decreased scar mobility, Increased fascial restricitons  Visit Diagnosis: Malignant neoplasm of upper-outer quadrant of right  breast in female, estrogen receptor positive ([place])  Abnormal posture  Stiffness of right shoulder, not elsewhere classified  Stiffness of left shoulder, not elsewhere classified  Localized edema  Aftercare following surgery for neoplasm     Problem List Patient Active Problem List   Diagnosis Date Noted   Port-A-Cath in place 03/04/2021   Genetic testing 01/27/2021   S/P mastectomy, bilateral 01/23/2021   Family history of colon cancer 01/15/2021   Malignant neoplasm of upper-outer quadrant of right breast in female, estrogen receptor positive ([place]) 01/13/2021   Pre-diabetes 11/06/2020   Acquired hypothyroidism 04/14/2018   Migraine 01/18/2015   Insomnia 11/05/2011   Anxiety 11/05/2011   Hyperlipidemia 11/05/2011   Tachycardia 72/09/2180   Umbilical hernia 88/33/7445    Claris Pong, PT 03/17/2021, 4:00 PM  [facility] @ [place], [place], [address] Phone: [phone]   Fax:  [phone]  Name: Laporchia Nakajima MRN: [id number] Date of Birth: 07-30-1965

## 2021-03-18 ENCOUNTER — Inpatient Hospital Stay: Payer: Managed Care, Other (non HMO) | Admitting: Hematology

## 2021-03-18 ENCOUNTER — Inpatient Hospital Stay: Payer: Managed Care, Other (non HMO)

## 2021-03-18 ENCOUNTER — Other Ambulatory Visit: Payer: Self-pay

## 2021-03-18 ENCOUNTER — Encounter: Payer: Self-pay | Admitting: Hematology

## 2021-03-18 VITALS — BP 143/95 | HR 94 | Temp 97.2°F | Resp 18 | Ht 60.0 in | Wt 127.5 lb

## 2021-03-18 DIAGNOSIS — Z170 Estrogen receptor positive status [ER+]: Secondary | ICD-10-CM

## 2021-03-18 DIAGNOSIS — Z5112 Encounter for antineoplastic immunotherapy: Secondary | ICD-10-CM | POA: Diagnosis not present

## 2021-03-18 DIAGNOSIS — C50411 Malignant neoplasm of upper-outer quadrant of right female breast: Secondary | ICD-10-CM

## 2021-03-18 DIAGNOSIS — Z95828 Presence of other vascular implants and grafts: Secondary | ICD-10-CM

## 2021-03-18 LAB — CMP (CANCER CENTER ONLY)
ALT: 18 U/L (ref 0–44)
AST: 24 U/L (ref 15–41)
Albumin: 4.3 g/dL (ref 3.5–5.0)
Alkaline Phosphatase: 49 U/L (ref 38–126)
Anion gap: 9 (ref 5–15)
BUN: 14 mg/dL (ref 6–20)
CO2: 25 mmol/L (ref 22–32)
Calcium: 9.3 mg/dL (ref 8.9–10.3)
Chloride: 106 mmol/L (ref 98–111)
Creatinine: 0.69 mg/dL (ref 0.44–1.00)
GFR, Estimated: >60 mL/min (ref >60)
Glucose, Bld: 129 mg/dL — ABNORMAL HIGH (ref 70–99)
Potassium: 4 mmol/L (ref 3.5–5.1)
Sodium: 140 mmol/L (ref 135–145)
Total Bilirubin: 0.3 mg/dL (ref 0.3–1.2)
Total Protein: 6.9 g/dL (ref 6.5–8.1)

## 2021-03-18 LAB — CBC WITH DIFFERENTIAL (CANCER CENTER ONLY)
Abs Immature Granulocytes: 0.02 10*3/uL (ref 0.00–0.07)
Basophils Absolute: 0 10*3/uL (ref 0.0–0.1)
Basophils Relative: 1 %
Eosinophils Absolute: 0.1 10*3/uL (ref 0.0–0.5)
Eosinophils Relative: 2 %
HCT: 32.3 % — ABNORMAL LOW (ref 36.0–46.0)
Hemoglobin: 10.8 g/dL — ABNORMAL LOW (ref 12.0–15.0)
Immature Granulocytes: 1 %
Lymphocytes Relative: 56 %
Lymphs Abs: 2.2 10*3/uL (ref 0.7–4.0)
MCH: 28.4 pg (ref 26.0–34.0)
MCHC: 33.4 g/dL (ref 30.0–36.0)
MCV: 85 fL (ref 80.0–100.0)
Monocytes Absolute: 0.2 10*3/uL (ref 0.1–1.0)
Monocytes Relative: 5 %
Neutro Abs: 1.4 10*3/uL — ABNORMAL LOW (ref 1.7–7.7)
Neutrophils Relative %: 35 %
Platelet Count: 242 10*3/uL (ref 150–400)
RBC: 3.8 MIL/uL — ABNORMAL LOW (ref 3.87–5.11)
RDW: 11.9 % (ref 11.5–15.5)
WBC Count: 3.9 10*3/uL — ABNORMAL LOW (ref 4.0–10.5)
nRBC: 0 % (ref 0.0–0.2)

## 2021-03-18 MED ORDER — PACLITAXEL PROTEIN-BOUND CHEMO INJECTION 100 MG
100.0000 mg/m2 | Freq: Once | INTRAVENOUS | Status: AC
  Administered 2021-03-18: 150 mg via INTRAVENOUS
  Filled 2021-03-18: qty 30

## 2021-03-18 MED ORDER — SODIUM CHLORIDE 0.9 % IV SOLN: Freq: Once | INTRAVENOUS | Status: AC

## 2021-03-18 MED ORDER — ACETAMINOPHEN 325 MG PO TABS
650.0000 mg | ORAL_TABLET | Freq: Once | ORAL | Status: AC
  Administered 2021-03-18: 650 mg via ORAL
  Filled 2021-03-18: qty 2

## 2021-03-18 MED ORDER — SODIUM CHLORIDE 0.9% FLUSH
10.0000 mL | Freq: Once | INTRAVENOUS | Status: AC
  Administered 2021-03-18: 10 mL

## 2021-03-18 MED ORDER — SODIUM CHLORIDE 0.9% FLUSH
10.0000 mL | INTRAVENOUS | Status: DC | PRN
  Administered 2021-03-18: 10 mL

## 2021-03-18 MED ORDER — HEPARIN SOD (PORK) LOCK FLUSH 100 UNIT/ML IV SOLN
500.0000 [IU] | Freq: Once | INTRAVENOUS | Status: AC | PRN
  Administered 2021-03-18: 500 [IU]

## 2021-03-18 MED ORDER — DIPHENHYDRAMINE HCL 50 MG/ML IJ SOLN
25.0000 mg | Freq: Once | INTRAMUSCULAR | Status: AC
  Administered 2021-03-18: 25 mg via INTRAVENOUS
  Filled 2021-03-18: qty 1

## 2021-03-18 MED ORDER — TRASTUZUMAB-ANNS CHEMO 150 MG IV SOLR
2.0000 mg/kg | Freq: Once | INTRAVENOUS | Status: AC
  Administered 2021-03-18: 105 mg via INTRAVENOUS
  Filled 2021-03-18: qty 5

## 2021-03-18 NOTE — Progress Notes (Signed)
[facility]   [phone] [phone]   Clinic Follow up Note   Patient Care Team: Copland, Gay Filler, MD as PCP - General (Family Medicine) Erroll Luna, MD as Consulting Physician (General Surgery) Truitt Merle, MD as Consulting Physician (Hematology) Eppie Gibson, MD as Attending Physician (Radiation Oncology) Mauro Kaufmann, RN as Oncology Nurse Navigator [name], RN as Oncology Nurse Navigator  Date of Service:  03/18/2021  CHIEF COMPLAINT: f/u of right breast cancer  CURRENT THERAPY:  Adjuvant paclitaxel/trastuzumab weekly, started 02/25/21  ASSESSMENT & PLAN:  Dana Kramer is a 56 y.o. female with   1. Malignant neoplasm of upper-outer quadrant of right breast, Stage IA, c(T1bN0M0), Triple positive, Grade 2 -screening detected 0.7 cm upper-outer right breast mass with associated calcifications extending 1.5 cm. No abnormal right lymph nodes or left malignancy. 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 -S/p b/l mastectomies on 01/23/21 with Dr. Brantley Stage. 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  Port was placed during surgery -Due to Her2+ disease adjuvant chemotherapy was recommended, she will proceed with weekly Abraxane/Herceptin x12 weeks followed by maintenance Herceptin every 3 weeks to total a year.  -Treatment plan includes adjuvant antiestrogen therapy to follow.  She would not need postmastectomy radiation -Baseline echo on 01/21/21 showed EF 60-65% with impaired relaxation (grade I diastolic dysfunction). Will monitor q3 months on herceptin -Started adjuvant chemotherapy with weekly Abraxane/Herceptin on 02/25/21. She is tolerating well with only some nausea, managed with medication.   2.  Elevated blood pressure and anxiety -Due to elevated BP readings at home (160/100s), patient's PCP prescribed amlodipine 5 mg once daily and continue with metoprolol 50 mg once daily.   -previously gave 0.1 mg clonidine with her treatment  -BP improved to 143/95 today (03/18/21).  -Monitor closely and advised to check BP at home.    3. Left pectoral tightness/limited range of motion -Currently undergoing PT. -Dr. Chryl Heck prescribed Flexeril 49m PO TID as needed, which she reports is helpful     PLAN: -Proceed with weekly abraxane/herceptin today at same full dose  -will get insurance approval for [employer] approval if ANC<=1.0 -If her sensation loss gets worse, will decrease Abraxane dose in future  -Continue PT -lab and abraxane/herceptin weekly x8 -f/u in 2, 4, 6, and 8 weeks   No problem-specific Assessment & Plan notes found for this encounter.   SUMMARY OF ONCOLOGIC HISTORY: Oncology History Overview Note   Cancer Staging  Malignant neoplasm of upper-outer quadrant of right breast in female, estrogen receptor positive ([place] Staging form: Breast, AJCC 8th Edition - Clinical stage from 01/08/2021: Stage IA (cT1b, cN0, cM0, G2, ER+, PR+, HER2+) - Signed by FTruitt Merle MD on 01/15/2021 - Pathologic stage from 01/23/2021: Stage IA (pT1c, pN0, cM0, G2, ER+, PR+, HER2+) - Signed by FTruitt Merle MD on 02/13/2021     Malignant neoplasm of upper-outer quadrant of right breast in female, estrogen receptor positive ([facility]  01/02/2021 Mammogram   EXAM: DIGITAL DIAGNOSTIC BILATERAL MAMMOGRAM WITH TOMOSYNTHESIS AND CAD; ULTRASOUND LEFT BREAST LIMITED; ULTRASOUND RIGHT BREAST LIMITED  IMPRESSION: 1. Highly suspicious 0.7 cm UPPER-OUTER RIGHT breast mass with associated suspicious calcifications extending 1.5 cm anteriorly. Tissue sampling is recommended. 2. No abnormal appearing RIGHT axillary lymph nodes. 3. Benign LOWER LEFT breast cyst corresponding to the LEFT breast screening study finding.   01/08/2021 Cancer Staging   Staging form: Breast, AJCC 8th Edition - Clinical stage from 01/08/2021: Stage IA (cT1b,  cN0, cM0, G2, ER+, PR+, HER2+) - Signed by Truitt Merle, MD on  01/15/2021 Stage prefix: Initial diagnosis Nuclear grade: G2 Histologic grading system: 3 grade system    01/08/2021 Pathology Results   Diagnosis Breast, right, needle core biopsy, upper outer quadrant, 11 o'clock, 5cmfn, ribbon clip - INVASIVE DUCTAL CARCINOMA - DUCTAL CARCINOMA IN SITU WITH CALCIFICATIONS - SEE COMMENT Microscopic Comment Based on the biopsy, the carcinoma appears Nottingham grade 2 of 3 and measures 0.6 cm in greatest linear extent.  PROGNOSTIC INDICATORS Results: The tumor cells are 2+ for Her2 (EQUIVOCAL). Her2 by FISH will be performed and results reported separately. Estrogen Receptor: 100%, POSITIVE, STRONG STAINING INTENSITY Progesterone Receptor: 70%, POSITIVE, STRONG STAINING INTENSITY Proliferation Marker Ki67: 10%  FLUORESCENCE IN-SITU HYBRIDIZATION Results: GROUP 1: HER2 **POSITIVE**   01/13/2021 Initial Diagnosis   Malignant neoplasm of upper-outer quadrant of right breast in female, estrogen receptor positive ([place])   01/15/2021 Genetic Testing   [facility] CustomNext Panel was Negative. Report date is 01/26/2021.  The CustomNext-Cancer+RNAinsight panel offered by [facility] includes sequencing and rearrangement analysis for the following 47 genes:  APC, ATM, AXIN2, BARD1, BMPR1A, BRCA1, BRCA2, BRIP1, CDH1, CDK4, CDKN2A, CHEK2, CTNNA1, DICER1, EPCAM, GREM1, HOXB13, KIT, MEN1, MLH1, MSH2, MSH3, MSH6, MUTYH, NBN, NF1, NTHL1, PALB2, PDGFRA, PMS2, POLD1, POLE, PTEN, RAD50, RAD51C, RAD51D, SDHA, SDHB, SDHC, SDHD, SMAD4, SMARCA4, STK11, TP53, TSC1, TSC2, and VHL.  RNA data is routinely analyzed for use in variant interpretation for all genes.   01/23/2021 Cancer Staging   Staging form: Breast, AJCC 8th Edition - Pathologic stage from 01/23/2021: Stage IA (pT1c, pN0, cM0, G2, ER+, PR+, HER2+) - Signed by Truitt Merle, MD on 02/13/2021 Histologic grading system: 3 grade system Residual tumor (R): R0 - None    01/23/2021 Definitive Surgery   FINAL MICROSCOPIC  DIAGNOSIS:   A. BREAST, LEFT, MASTECTOMY:  - Fibrocystic changes with sclerosing adenosis and calcifications.  - Fibroadenoma (0.6 cm).  - No evidence of malignancy.   B. BREAST, RIGHT, MASTECTOMY:  - Invasive and in situ ductal carcinoma, 1.7 cm.  - Margins negative for carcinoma.  - Biopsy site and biopsy clip.  - See oncology table.   C. LYMPH NODE, RIGHT AXILLARY, SENTINEL, EXCISION:  - One lymph node negative for metastatic carcinoma (0/1).   D. LYMPH NODE, RIGHT AXILLARY, SENTINEL, EXCISION:  - One lymph node negative for metastatic carcinoma (0/1).   E. LYMPH NODE, RIGHT AXILLARY, SENTINEL, EXCISION:  - One lymph node negative for metastatic carcinoma (0/1).   F. LYMPH NODE, RIGHT AXILLARY, SENTINEL, EXCISION:  - One lymph node negative for metastatic carcinoma (0/1).    02/25/2021 -  Chemotherapy   Patient is on Treatment Plan : BREAST Paclitaxel + Trastuzumab q7d / Trastuzumab q21d        INTERVAL HISTORY:  Dana Kramer is here for a follow up of breast cancer. She was last seen by PA Murray Hodgkins on 03/04/21. She presents to the clinic accompanied by her daughter. She reports she has been tolerating chemotherapy well. She reports nausea, mostly on the second or third day and managed well with medication. Her weight is overall stable. She has some mildly decreased sensation on tuning fork exam, but she is asymptomatic. We will monitor.   All other systems were reviewed with the patient and are negative.  MEDICAL HISTORY:  Past Medical History:  Diagnosis Date   Allergy 11/1998   Penicillin   Chicken pox    History of transfusion of whole blood  Hyperlipidemia    Malignant neoplasm of upper-outer quadrant of right breast in female, estrogen receptor positive ([place]) 01/13/2021   Migraine    Thyroid disease    Hypothyroidism    SURGICAL HISTORY: Past Surgical History:  Procedure Laterality Date   ABDOMINOPLASTY  10/01/2010   CESAREAN SECTION     twice   HERNIA  REPAIR     PORTACATH PLACEMENT Right 01/23/2021   Procedure: Right internal jugular 8 [place] PowerPort with C arm and ultrasound guidance;  Surgeon: Erroll Luna, MD;  Location: [facility];  Service: General;  Laterality: Right;   SENTINEL NODE BIOPSY Right 01/23/2021   Procedure: Right axillary sentinel lymph node mapping using Mag Trace;  Surgeon: Erroll Luna, MD;  Location: [facility];  Service: General;  Laterality: Right;   SIMPLE MASTECTOMY WITH AXILLARY SENTINEL NODE BIOPSY Bilateral 01/23/2021   Procedure: Right simple mastectomy ;  Left risk reducing simple mastectomy;  Surgeon: Erroll Luna, MD;  Location: [facility];  Service: General;  Laterality: Bilateral;   TUBAL LIGATION     umblica hernia repair  78/47/8412    I have reviewed the social history and family history with the patient and they are unchanged from previous note.  ALLERGIES:  is allergic to penicillins.  MEDICATIONS:  Current Outpatient Medications  Medication Sig Dispense Refill   amLODipine (NORVASC) 5 MG tablet Take 1 tablet (5 mg total) by mouth daily. 90 tablet 3   b complex vitamins capsule Take 1 capsule by mouth daily.     Calcium Citrate-Vitamin D (CALCIUM + D PO) Take 1 tablet by mouth daily.     Cholecalciferol (VITAMIN D PO) Take 1 tablet by mouth daily.     cyclobenzaprine (FLEXERIL) 5 MG tablet Take 1 tablet (5 mg total) by mouth 3 (three) times daily as needed for muscle spasms. 10 tablet 0   ibuprofen (ADVIL) 800 MG tablet Take 1 tablet (800 mg total) by mouth every 8 (eight) hours as needed. 30 tablet 0   Ibuprofen-diphenhydrAMINE HCl (ADVIL PM) 200-25 MG CAPS Take 2 tablets by mouth daily as needed (pain).     levothyroxine (SYNTHROID) 25 MCG tablet TAKE 1 TABLET(25 MCG) BY MOUTH DAILY BEFORE BREAKFAST 90 tablet 3   lidocaine-prilocaine (EMLA) cream Apply 1 application topically as needed (To be applied to portacath 30 to 60 mins prior to portacath access). 30 g 2   Melatonin 10 MG CAPS Take 10 mg by  mouth at bedtime.     metoprolol succinate (TOPROL-XL) 50 MG 24 hr tablet Take 1 tablet (50 mg total) by mouth daily. Take with or immediately following a meal. 90 tablet 3   Multiple Vitamins-Minerals (WOMENS MULTIVITAMIN PO) Take 1 tablet by mouth daily.     omeprazole (PRILOSEC OTC) 20 MG tablet Take 20 mg by mouth daily.     ondansetron (ZOFRAN) 8 MG tablet Take 1 tablet (8 mg total) by mouth 2 (two) times daily as needed (Nausea or vomiting). 30 tablet 1   oxyCODONE (OXY IR/ROXICODONE) 5 MG immediate release tablet Take 1 tablet (5 mg total) by mouth every 6 (six) hours as needed for severe pain. 15 tablet 0   prochlorperazine (COMPAZINE) 10 MG tablet Take 1 tablet (10 mg total) by mouth every 6 (six) hours as needed (Nausea or vomiting). 30 tablet 1   simvastatin (ZOCOR) 20 MG tablet TAKE 1 TABLET(20 MG) BY MOUTH AT BEDTIME 90 tablet 3   No current facility-administered medications for this visit.   Facility-Administered Medications Ordered in  Other Visits  Medication Dose Route Frequency Provider Last Rate Last Admin   acetaminophen (TYLENOL) tablet 650 mg  650 mg Oral Once Truitt Merle, MD       diphenhydrAMINE (BENADRYL) injection 25 mg  25 mg Intravenous Once Truitt Merle, MD       heparin lock flush 100 unit/mL  500 Units Intracatheter Once PRN Truitt Merle, MD       PACLitaxel-protein bound (ABRAXANE) chemo infusion 150 mg  100 mg/m2 (Order-Specific) Intravenous Once Truitt Merle, MD       sodium chloride flush (NS) 0.9 % injection 10 mL  10 mL Intracatheter PRN Truitt Merle, MD       trastuzumab-[facility]s [facility]) 105 mg in sodium chloride 0.9 % 250 mL chemo infusion  2 mg/kg (Order-Specific) Intravenous Once Truitt Merle, MD        PHYSICAL EXAMINATION: ECOG PERFORMANCE STATUS: 1 - Symptomatic but completely ambulatory  Vitals:   03/18/21 1022  BP: (!) 143/95  Pulse: 94  Resp: 18  Temp: (!) 97.2 F (36.2 C)  SpO2: 100%   Wt Readings from Last 3 Encounters:  03/18/21 127 lb 8 oz (57.8 kg)   03/17/21 124 lb 6.4 oz (56.4 kg)  03/11/21 127 lb 8 oz (57.8 kg)     GENERAL:alert, no distress and comfortable SKIN: skin color normal, no rashes or significant lesions EYES: normal, Conjunctiva are pink and non-injected, sclera clear  NEURO: alert & oriented x 3 with fluent speech  LABORATORY DATA:  I have reviewed the data as listed CBC Latest Ref Rng & Units 03/18/2021 03/11/2021 03/04/2021  WBC 4.0 - 10.5 K/uL 3.9(L) 4.6 5.3  Hemoglobin 12.0 - 15.0 g/dL 10.8(L) 11.3(L) 11.5(L)  Hematocrit 36.0 - 46.0 % 32.3(L) 34.4(L) 33.1(L)  Platelets 150 - 400 K/uL 242 226 308     CMP Latest Ref Rng & Units 03/18/2021 03/11/2021 03/04/2021  Glucose 70 - 99 mg/dL 129(H) 126(H) 106(H)  BUN 6 - 20 mg/dL 14 11 12   Creatinine 0.44 - 1.00 mg/dL 0.69 0.74 0.70  Sodium 135 - 145 mmol/L 140 140 138  Potassium 3.5 - 5.1 mmol/L 4.0 4.0 4.3  Chloride 98 - 111 mmol/L 106 107 104  CO2 22 - 32 mmol/L 25 23 26   Calcium 8.9 - 10.3 mg/dL 9.3 9.1 9.7  Total Protein 6.5 - 8.1 g/dL 6.9 7.1 7.3  Total Bilirubin 0.3 - 1.2 mg/dL 0.3 0.3 0.3  Alkaline Phos 38 - 126 U/L 49 57 55  AST 15 - 41 U/L 24 26 19   ALT 0 - 44 U/L 18 13 9       RADIOGRAPHIC STUDIES: I have personally reviewed the radiological images as listed and agreed with the findings in the report. No results found.    Orders Placed This Encounter  Procedures   ECHOCARDIOGRAM COMPLETE    Standing Status:   Future    Standing Expiration Date:   03/18/2022    Order Specific Question:   Where should this test be performed    Answer:   [place]    Order Specific Question:   Perflutren DEFINITY (image enhancing agent) should be administered unless hypersensitivity or allergy exist    Answer:   Administer Perflutren    Order Specific Question:   Is a special reader required? (athlete or structural heart)    Answer:   No    Order Specific Question:   Does this study need to be read by the Structural team/Level 3 readers?    Answer:  No    Order  Specific Question:   Reason for exam-Echo    Answer:   Chemo  Z09   All questions were answered. The patient knows to call the clinic with any problems, questions or concerns. No barriers to learning was detected. The total time spent in the appointment was 30 minutes.     Truitt Merle, MD 03/18/2021   I, Wilburn Mylar, am acting as scribe for Truitt Merle, MD.   I have reviewed the above documentation for accuracy and completeness, and I agree with the above.

## 2021-03-18 NOTE — Progress Notes (Signed)
Ok to treat with ANC 1.4 per Dr Burr Medico

## 2021-03-18 NOTE — Progress Notes (Signed)
Sent FMLA extension paperwork to [employer] today.

## 2021-03-19 ENCOUNTER — Ambulatory Visit: Payer: Managed Care, Other (non HMO)

## 2021-03-19 ENCOUNTER — Other Ambulatory Visit: Payer: Self-pay

## 2021-03-19 DIAGNOSIS — Z483 Aftercare following surgery for neoplasm: Secondary | ICD-10-CM

## 2021-03-19 DIAGNOSIS — R600 Localized edema: Secondary | ICD-10-CM

## 2021-03-19 DIAGNOSIS — C50411 Malignant neoplasm of upper-outer quadrant of right female breast: Secondary | ICD-10-CM | POA: Diagnosis not present

## 2021-03-19 DIAGNOSIS — M25611 Stiffness of right shoulder, not elsewhere classified: Secondary | ICD-10-CM

## 2021-03-19 DIAGNOSIS — Z170 Estrogen receptor positive status [ER+]: Secondary | ICD-10-CM

## 2021-03-19 DIAGNOSIS — R293 Abnormal posture: Secondary | ICD-10-CM

## 2021-03-19 DIAGNOSIS — M25612 Stiffness of left shoulder, not elsewhere classified: Secondary | ICD-10-CM

## 2021-03-19 NOTE — Therapy (Signed)
[facility] @ [place], [place], [address] Phone: [phone]   Fax:  [phone]  Physical Therapy Treatment  Patient Details  Name: Dana Kramer MRN: [id number] Date of Birth: 03/28/1965 Referring Provider (PT): Dr. Erroll Luna   Encounter Date: 03/19/2021   PT End of Session - 03/19/21 1005     Visit Number 8    Number of Visits 10    Date for PT Re-Evaluation 03/20/21    PT Start Time 0907    PT Stop Time 1004    PT Time Calculation (min) 57 min    Activity Tolerance Patient tolerated treatment well    Behavior During Therapy [facility] for tasks assessed/performed             Past Medical History:  Diagnosis Date   Allergy 11/1998   Penicillin   Chicken pox    History of transfusion of whole blood    Hyperlipidemia    Malignant neoplasm of upper-outer quadrant of right breast in female, estrogen receptor positive ([facility]) 01/13/2021   Migraine    Thyroid disease    Hypothyroidism    Past Surgical History:  Procedure Laterality Date   ABDOMINOPLASTY  10/01/2010   CESAREAN SECTION     twice   HERNIA REPAIR     PORTACATH PLACEMENT Right 01/23/2021   Procedure: Right internal jugular 8 [place] PowerPort with C arm and ultrasound guidance;  Surgeon: Erroll Luna, MD;  Location: [facility];  Service: General;  Laterality: Right;   SENTINEL NODE BIOPSY Right 01/23/2021   Procedure: Right axillary sentinel lymph node mapping using Mag Trace;  Surgeon: Erroll Luna, MD;  Location: [facility];  Service: General;  Laterality: Right;   SIMPLE MASTECTOMY WITH AXILLARY SENTINEL NODE BIOPSY Bilateral 01/23/2021   Procedure: Right simple mastectomy ;  Left risk reducing simple mastectomy;  Surgeon: Erroll Luna, MD;  Location: [facility];  Service: General;  Laterality: Bilateral;   TUBAL LIGATION     umblica hernia repair  34/91/7915    There were no vitals filed for this visit.   Subjective Assessment - 03/19/21  0909     Subjective The axilla is feeling better, now just have some occassional pulling in my Rt medial upper arm and at Lt pectoralis insertion area that sometimes feels like a "hold".    Pertinent History Patient was diagnosed on 12/10/2020 with right grade II invasive ductal carcinoma breast cancer. She underwent a bilateral mastectomy with a right sentinel node biopsy (4 negative nodes) on 01/23/2021. It is ER/PR positive and HER2 negative with a Ki67 of 10%.    Patient Stated Goals Regain shoulder function    Currently in Pain? No/denies                               [facility] Adult PT Treatment/Exercise - 03/19/21 0001       Shoulder Exercises: Pulleys   Flexion 2 minutes    ABduction 2 minutes      Shoulder Exercises: Therapy Ball   Flexion Both;10 reps   forward lean into end of stretch   ABduction Right;Left;5 reps   same side lean into end of stretch   ABduction Limitations Pt reports still feeling tightness at Lt pectoralis and this ex stretches it very well here      Shoulder Exercises: Stretch   Other Shoulder Stretches Modified downward dog on wall returning therapist demo 5x, 5-10  sec holds      Manual Therapy   Soft tissue mobilization Left pectoralis insertion to decrease tissue tension; then at Rt upper arm where cirding present    Myofascial Release In Supine to Lt pectoralis and at insertion where palpably very tight; and at right axillary region and upper arm area of cording    Passive ROM In Supine to bilateral shoulders into flexion, abduction, and D2 flex with occasional VC's to relax                          PT Long Term Goals - 02/20/21 1409       PT LONG TERM GOAL #1   Title Patient will demonstrate she has regained full shoulder ROM and function post operatively compared to baselines.    Time 4    Period Weeks    Status On-going    Target Date 03/20/21      PT LONG TERM GOAL #2   Title Patient will increase  bilateral shoulder flexion to >/= 150 degrees for increased ease reaching.    Time 4    Period Weeks    Status New    Target Date 03/20/21      PT LONG TERM GOAL #3   Title Patient will increase bil shoulder abduction to >/= 160 degrees for increased ease with normal daily tasks and work tasks.    Time 4    Period Weeks    Status New    Target Date 03/20/21      PT LONG TERM GOAL #4   Title Patient will be independent with her HEP for shoulder ROM.    Time 4    Period Weeks    Status New    Target Date 03/20/21      PT LONG TERM GOAL #5   Title Patient will verbalize good understanding of lymphedema risk reduction practices after attending the After Breast Cancer Class.    Time 4    Period Weeks    Status New    Target Date 03/20/21                   Plan - 03/19/21 1005     Clinical Impression Statement Continued with AA/ROM and manaul therapy working to decrease limiting fascial tightness at bil upper quadrants. Pt reports noting excellent progress over past 2 visits with tightness decreasing and A/ROM improving. She will benefit from a few more visit s to progress her HEP with postural strength.    Personal Factors and Comorbidities Age    Stability/Clinical Decision Making Stable/Uncomplicated    Rehab Potential Excellent    PT Frequency 2x / week    PT Duration 4 weeks    PT Treatment/Interventions ADLs/Self Care Home Management;Therapeutic exercise;Patient/family education;Manual techniques;Manual lymph drainage;Passive range of motion;Scar mobilization;Therapeutic activities    PT Next Visit Plan Renewal next decr freq to 1x/wk; add supine scapular series with yellow theraband; PROM bil shoulders and AAROM exercises, cont pulleys and ball roll up wall, MLD as needed, STM prn    PT Home Exercise Plan Post op HEP, supine wand flexion and scaption    Consulted and Agree with Plan of Care Patient;Family member/caregiver    Family Member Consulted daughter              Patient will benefit from skilled therapeutic intervention in order to improve the following deficits and impairments:  Postural dysfunction, Decreased range of motion, Decreased knowledge  of precautions, Impaired UE functional use, Pain, Increased edema, Decreased scar mobility, Increased fascial restricitons  Visit Diagnosis: Malignant neoplasm of upper-outer quadrant of right breast in female, estrogen receptor positive ([facility])  Abnormal posture  Stiffness of right shoulder, not elsewhere classified  Stiffness of left shoulder, not elsewhere classified  Localized edema  Aftercare following surgery for neoplasm     Problem List Patient Active Problem List   Diagnosis Date Noted   Port-A-Cath in place 03/04/2021   Genetic testing 01/27/2021   S/P mastectomy, bilateral 01/23/2021   Family history of colon cancer 01/15/2021   Malignant neoplasm of upper-outer quadrant of right breast in female, estrogen receptor positive ([facility]) 01/13/2021   Pre-diabetes 11/06/2020   Acquired hypothyroidism 04/14/2018   Migraine 01/18/2015   Insomnia 11/05/2011   Anxiety 11/05/2011   Hyperlipidemia 11/05/2011   Tachycardia 21/62/4469   Umbilical hernia 50/72/2575    Otelia Limes, PTA 03/19/2021, 10:11 AM  [facility] @ [place], [place], [address] Phone: [phone]   Fax:  [phone]  Name: Dana Kramer MRN: [id number] Date of Birth: Oct 24, 1965

## 2021-03-20 ENCOUNTER — Ambulatory Visit: Payer: Managed Care, Other (non HMO) | Admitting: Family Medicine

## 2021-03-25 ENCOUNTER — Other Ambulatory Visit: Payer: Self-pay

## 2021-03-25 ENCOUNTER — Inpatient Hospital Stay: Payer: Managed Care, Other (non HMO)

## 2021-03-25 VITALS — BP 127/90 | HR 98 | Temp 98.6°F | Resp 17 | Wt 125.2 lb

## 2021-03-25 DIAGNOSIS — Z170 Estrogen receptor positive status [ER+]: Secondary | ICD-10-CM

## 2021-03-25 DIAGNOSIS — Z5112 Encounter for antineoplastic immunotherapy: Secondary | ICD-10-CM | POA: Diagnosis not present

## 2021-03-25 DIAGNOSIS — C50411 Malignant neoplasm of upper-outer quadrant of right female breast: Secondary | ICD-10-CM

## 2021-03-25 DIAGNOSIS — Z95828 Presence of other vascular implants and grafts: Secondary | ICD-10-CM

## 2021-03-25 LAB — CMP (CANCER CENTER ONLY)
ALT: 13 U/L (ref 0–44)
AST: 21 U/L (ref 15–41)
Albumin: 4.4 g/dL (ref 3.5–5.0)
Alkaline Phosphatase: 54 U/L (ref 38–126)
Anion gap: 8 (ref 5–15)
BUN: 10 mg/dL (ref 6–20)
CO2: 27 mmol/L (ref 22–32)
Calcium: 9.7 mg/dL (ref 8.9–10.3)
Chloride: 105 mmol/L (ref 98–111)
Creatinine: 0.68 mg/dL (ref 0.44–1.00)
GFR, Estimated: >60 mL/min (ref >60)
Glucose, Bld: 113 mg/dL — ABNORMAL HIGH (ref 70–99)
Potassium: 4.4 mmol/L (ref 3.5–5.1)
Sodium: 140 mmol/L (ref 135–145)
Total Bilirubin: 0.3 mg/dL (ref 0.3–1.2)
Total Protein: 7.2 g/dL (ref 6.5–8.1)

## 2021-03-25 LAB — CBC WITH DIFFERENTIAL (CANCER CENTER ONLY)
Abs Immature Granulocytes: 0.03 10*3/uL (ref 0.00–0.07)
Basophils Absolute: 0 10*3/uL (ref 0.0–0.1)
Basophils Relative: 1 %
Eosinophils Absolute: 0.1 10*3/uL (ref 0.0–0.5)
Eosinophils Relative: 2 %
HCT: 32.5 % — ABNORMAL LOW (ref 36.0–46.0)
Hemoglobin: 11.3 g/dL — ABNORMAL LOW (ref 12.0–15.0)
Immature Granulocytes: 1 %
Lymphocytes Relative: 50 %
Lymphs Abs: 2.1 10*3/uL (ref 0.7–4.0)
MCH: 28.8 pg (ref 26.0–34.0)
MCHC: 34.8 g/dL (ref 30.0–36.0)
MCV: 82.7 fL (ref 80.0–100.0)
Monocytes Absolute: 0.2 10*3/uL (ref 0.1–1.0)
Monocytes Relative: 6 %
Neutro Abs: 1.7 10*3/uL (ref 1.7–7.7)
Neutrophils Relative %: 40 %
Platelet Count: 256 10*3/uL (ref 150–400)
RBC: 3.93 MIL/uL (ref 3.87–5.11)
RDW: 12.2 % (ref 11.5–15.5)
WBC Count: 4.2 10*3/uL (ref 4.0–10.5)
nRBC: 0 % (ref 0.0–0.2)

## 2021-03-25 MED ORDER — TRASTUZUMAB-ANNS CHEMO 150 MG IV SOLR
2.0000 mg/kg | Freq: Once | INTRAVENOUS | Status: AC
  Administered 2021-03-25: 105 mg via INTRAVENOUS
  Filled 2021-03-25: qty 5

## 2021-03-25 MED ORDER — SODIUM CHLORIDE 0.9% FLUSH
10.0000 mL | Freq: Once | INTRAVENOUS | Status: AC
  Administered 2021-03-25: 10 mL

## 2021-03-25 MED ORDER — DIPHENHYDRAMINE HCL 50 MG/ML IJ SOLN
25.0000 mg | Freq: Once | INTRAMUSCULAR | Status: AC
  Administered 2021-03-25: 25 mg via INTRAVENOUS
  Filled 2021-03-25: qty 1

## 2021-03-25 MED ORDER — PACLITAXEL PROTEIN-BOUND CHEMO INJECTION 100 MG
100.0000 mg/m2 | Freq: Once | INTRAVENOUS | Status: AC
  Administered 2021-03-25: 150 mg via INTRAVENOUS
  Filled 2021-03-25: qty 30

## 2021-03-25 MED ORDER — SODIUM CHLORIDE 0.9 % IV SOLN: Freq: Once | INTRAVENOUS | Status: AC

## 2021-03-25 MED ORDER — ACETAMINOPHEN 325 MG PO TABS
650.0000 mg | ORAL_TABLET | Freq: Once | ORAL | Status: AC
  Administered 2021-03-25: 650 mg via ORAL
  Filled 2021-03-25: qty 2

## 2021-03-25 NOTE — Patient Instructions (Signed)
[facility]  Discharge Instructions: Thank you for choosing [facility] to provide your oncology and hematology care.   If you have a lab appointment with the [facility], please go directly to the [facility] and check in at the registration area.   Wear comfortable clothing and clothing appropriate for easy access to any Portacath or PICC line.   We strive to give you quality time with your provider. You may need to reschedule your appointment if you arrive late (15 or more minutes).  Arriving late affects you and other patients whose appointments are after yours.  Also, if you miss three or more appointments without notifying the office, you may be dismissed from the clinic at the providers discretion.      For prescription refill requests, have your pharmacy contact our office and allow 72 hours for refills to be completed.    Today you received the following chemotherapy and/or immunotherapy agents: Trastuzumab and Abraxane      To help prevent nausea and vomiting after your treatment, we encourage you to take your nausea medication as directed.  BELOW ARE SYMPTOMS THAT SHOULD BE REPORTED IMMEDIATELY: *FEVER GREATER THAN 100.4 F (38 C) OR HIGHER *CHILLS OR SWEATING *NAUSEA AND VOMITING THAT IS NOT CONTROLLED WITH YOUR NAUSEA MEDICATION *UNUSUAL SHORTNESS OF BREATH *UNUSUAL BRUISING OR BLEEDING *URINARY PROBLEMS (pain or burning when urinating, or frequent urination) *BOWEL PROBLEMS (unusual diarrhea, constipation, pain near the anus) TENDERNESS IN MOUTH AND THROAT WITH OR WITHOUT PRESENCE OF ULCERS (sore throat, sores in mouth, or a toothache) UNUSUAL RASH, SWELLING OR PAIN  UNUSUAL VAGINAL DISCHARGE OR ITCHING   Items with * indicate a potential emergency and should be followed up as soon as possible or go to the Emergency Department if any problems should occur.  Please show the CHEMOTHERAPY ALERT CARD or IMMUNOTHERAPY ALERT CARD  at check-in to the Emergency Department and triage nurse.  Should you have questions after your visit or need to cancel or reschedule your appointment, please contact [facility]  Dept: [phone]  and follow the prompts.  Office hours are 8:00 a.m. to 4:30 p.m. Monday - Friday. Please note that voicemails left after 4:00 p.m. may not be returned until the following business day.  We are closed weekends and major holidays. You have access to a nurse at all times for urgent questions. Please call the main number to the clinic Dept: [phone] and follow the prompts.   For any non-urgent questions, you may also contact your provider using MyChart. We now offer e-Visits for anyone 91 and older to request care online for non-urgent symptoms. For details visit [email or web address].   Also download the MyChart app! Go to the app store, search "MyChart", open the app, select [facility], and log in with your MyChart username and password.  Due to Covid, a mask is required upon entering the hospital/clinic. If you do not have a mask, one will be given to you upon arrival. For doctor visits, patients may have 1 support person aged 84 or older with them. For treatment visits, patients cannot have anyone with them due to current Covid guidelines and our immunocompromised population.

## 2021-03-26 ENCOUNTER — Ambulatory Visit: Payer: Managed Care, Other (non HMO) | Admitting: Rehabilitation

## 2021-03-26 ENCOUNTER — Encounter: Payer: Self-pay | Admitting: Rehabilitation

## 2021-03-26 DIAGNOSIS — Z170 Estrogen receptor positive status [ER+]: Secondary | ICD-10-CM

## 2021-03-26 DIAGNOSIS — M25612 Stiffness of left shoulder, not elsewhere classified: Secondary | ICD-10-CM

## 2021-03-26 DIAGNOSIS — R293 Abnormal posture: Secondary | ICD-10-CM

## 2021-03-26 DIAGNOSIS — C50411 Malignant neoplasm of upper-outer quadrant of right female breast: Secondary | ICD-10-CM

## 2021-03-26 DIAGNOSIS — M25611 Stiffness of right shoulder, not elsewhere classified: Secondary | ICD-10-CM

## 2021-03-26 DIAGNOSIS — Z483 Aftercare following surgery for neoplasm: Secondary | ICD-10-CM

## 2021-03-26 DIAGNOSIS — R600 Localized edema: Secondary | ICD-10-CM

## 2021-03-26 NOTE — Patient Instructions (Signed)

## 2021-03-26 NOTE — Therapy (Signed)
[facility] @ [place], [place], [address] Phone: [phone]   Fax:  [phone]  Physical Therapy Treatment  Patient Details  Name: Dana Kramer MRN: [id number] Date of Birth: 02/10/1966 Referring Provider (PT): Dr. Erroll Luna   Encounter Date: 03/26/2021   PT End of Session - 03/26/21 0953     Visit Number 9    Number of Visits 13    Date for PT Re-Evaluation 05/07/21    PT Start Time 0906    PT Stop Time 0954    PT Time Calculation (min) 48 min    Activity Tolerance Patient tolerated treatment well    Behavior During Therapy [facility] for tasks assessed/performed             Past Medical History:  Diagnosis Date   Allergy 11/1998   Penicillin   Chicken pox    History of transfusion of whole blood    Hyperlipidemia    Malignant neoplasm of upper-outer quadrant of right breast in female, estrogen receptor positive ([facility]) 01/13/2021   Migraine    Thyroid disease    Hypothyroidism    Past Surgical History:  Procedure Laterality Date   ABDOMINOPLASTY  10/01/2010   CESAREAN SECTION     twice   HERNIA REPAIR     PORTACATH PLACEMENT Right 01/23/2021   Procedure: Right internal jugular 8 [place] PowerPort with C arm and ultrasound guidance;  Surgeon: Erroll Luna, MD;  Location: [facility];  Service: General;  Laterality: Right;   SENTINEL NODE BIOPSY Right 01/23/2021   Procedure: Right axillary sentinel lymph node mapping using Mag Trace;  Surgeon: Erroll Luna, MD;  Location: [facility];  Service: General;  Laterality: Right;   SIMPLE MASTECTOMY WITH AXILLARY SENTINEL NODE BIOPSY Bilateral 01/23/2021   Procedure: Right simple mastectomy ;  Left risk reducing simple mastectomy;  Surgeon: Erroll Luna, MD;  Location: [facility];  Service: General;  Laterality: Bilateral;   TUBAL LIGATION     umblica hernia repair  39/76/7341    There were no vitals filed for this visit.   Subjective Assessment - 03/26/21  0905     Subjective I am doing well    Pertinent History Patient was diagnosed on 12/10/2020 with right grade II invasive ductal carcinoma breast cancer. She underwent a bilateral mastectomy with a right sentinel node biopsy (4 negative nodes) on 01/23/2021. It is ER/PR positive and HER2 negative with a Ki67 of 10%.    Currently in Pain? No/denies                [facility] PT Assessment - 03/26/21 0001       AROM   Right Shoulder Flexion 153 Degrees   pull   Right Shoulder ABduction 160 Degrees   pull   Left Shoulder Flexion 145 Degrees   tight   Left Shoulder ABduction 170 Degrees   pull                          [facility] Adult PT Treatment/Exercise - 03/26/21 0001       Exercises   Exercises Shoulder      Shoulder Exercises: Supine   Horizontal ABduction Both;5 reps    Theraband Level (Shoulder Horizontal ABduction) Level 1 (Yellow)    Horizontal ABduction Limitations with initial instruction    External Rotation Both;5 reps    Theraband Level (Shoulder External Rotation) Level 1 (Yellow)    Flexion Both;5 reps  Theraband Level (Shoulder Flexion) Level 1 (Yellow)    Diagonals Both;5 reps    Theraband Level (Shoulder Diagonals) Level 1 (Yellow)      Shoulder Exercises: Standing   Row Both;15 reps    Theraband Level (Shoulder Row) Level 1 (Yellow)    Row Limitations with initial instruction      Shoulder Exercises: Pulleys   Flexion 2 minutes    ABduction 2 minutes      Shoulder Exercises: Therapy Ball   Flexion Both;10 reps    ABduction Right;Left;5 reps      Shoulder Exercises: Stretch   Other Shoulder Stretches Modified downward dog on wall returning therapist demo 5x, 5-10 sec holds      Manual Therapy   Myofascial Release to Rt cording along axill and upper arm    Passive ROM In Supine to Rt shoulder into flexion, abduction, and D2 flex                          PT Long Term Goals - 03/26/21 0910       PT LONG TERM GOAL #1    Title Patient will demonstrate she has regained full shoulder ROM and function post operatively compared to baselines.    Baseline Rt Flex: 160, abd: 180,    Lt Flex: 155, Abd: 180    Status Partially Met      PT LONG TERM GOAL #2   Title Patient will increase bilateral shoulder flexion to >/= 150 degrees for increased ease reaching.    Baseline 5 deg lacking on Lt    Status Partially Met      PT LONG TERM GOAL #3   Title Patient will increase bil shoulder abduction to >/= 160 degrees for increased ease with normal daily tasks and work tasks.    Status Partially Met      PT LONG TERM GOAL #4   Title Patient will be independent with her HEP for shoulder ROM.    Status On-going      PT LONG TERM GOAL #5   Title Patient will verbalize good understanding of lymphedema risk reduction practices after attending the After Breast Cancer Class.    Status Achieved                   Plan - 03/26/21 0955     Clinical Impression Statement Pt is making excellent progress compared to last visit wiht this PT.  Continues with Rt upper arm and axilla cording as the main limiting factor.  Extended POC x 6 weeks 1x per week.  Pt is hoping to go back to work after the end of Feb.    PT Frequency 2x / week    PT Duration 4 weeks    PT Treatment/Interventions ADLs/Self Care Home Management;Therapeutic exercise;Patient/family education;Manual techniques;Manual lymph drainage;Passive range of motion;Scar mobilization;Therapeutic activities    PT Next Visit Plan PROM bil shoulders and AAROM exercises, Rt cording release, educated on advancing supine scap and not doing every day.    PT Home Exercise Plan Post op HEP, supine wand flexion and scaption, sup scap    Consulted and Agree with Plan of Care Patient             Patient will benefit from skilled therapeutic intervention in order to improve the following deficits and impairments:     Visit Diagnosis: Malignant neoplasm of upper-outer  quadrant of right breast in female, estrogen receptor positive ([facility])  Abnormal  posture  Stiffness of right shoulder, not elsewhere classified  Stiffness of left shoulder, not elsewhere classified  Localized edema  Aftercare following surgery for neoplasm     Problem List Patient Active Problem List   Diagnosis Date Noted   Port-A-Cath in place 03/04/2021   Genetic testing 01/27/2021   S/P mastectomy, bilateral 01/23/2021   Family history of colon cancer 01/15/2021   Malignant neoplasm of upper-outer quadrant of right breast in female, estrogen receptor positive ([facility]) 01/13/2021   Pre-diabetes 11/06/2020   Acquired hypothyroidism 04/14/2018   Migraine 01/18/2015   Insomnia 11/05/2011   Anxiety 11/05/2011   Hyperlipidemia 11/05/2011   Tachycardia 68/01/5725   Umbilical hernia 20/35/5974    Stark Bray, PT 03/26/2021, 9:57 AM  [facility] @ [place], [place], [address] Phone: [phone]   Fax:  [phone]  Name: Trenton Verne MRN: [id number] Date of Birth: 1965-10-09

## 2021-03-26 NOTE — Addendum Note (Signed)
Addended by: Stark Bray on: 03/26/2021 09:59 AM   Modules accepted: Orders

## 2021-03-27 ENCOUNTER — Telehealth: Payer: Self-pay | Admitting: *Deleted

## 2021-03-27 NOTE — Telephone Encounter (Addendum)
Attending physician statement successfully faxed to [facility] at [phone].  Original copy to alphabetical file folder behind appointment registration area one for patient pickup on next scheduled appointment. Copy to "Record Release" bin in front office area before Managed Care for [facility] staff to forward [facility] request for "Office Notes with Physical Examination Findings" to [facility] ([place]) Information Management Office, Phone: [phone], Fax: [phone] to complete this process.

## 2021-04-01 ENCOUNTER — Encounter: Payer: Self-pay | Admitting: *Deleted

## 2021-04-01 ENCOUNTER — Inpatient Hospital Stay (HOSPITAL_BASED_OUTPATIENT_CLINIC_OR_DEPARTMENT_OTHER): Payer: Managed Care, Other (non HMO)

## 2021-04-01 ENCOUNTER — Other Ambulatory Visit: Payer: Self-pay

## 2021-04-01 ENCOUNTER — Inpatient Hospital Stay: Payer: Managed Care, Other (non HMO)

## 2021-04-01 ENCOUNTER — Encounter: Payer: Self-pay | Admitting: Nurse Practitioner

## 2021-04-01 ENCOUNTER — Inpatient Hospital Stay: Payer: Managed Care, Other (non HMO) | Admitting: Nurse Practitioner

## 2021-04-01 VITALS — BP 137/99 | HR 95 | Temp 98.2°F | Resp 17 | Ht 60.0 in | Wt 126.4 lb

## 2021-04-01 DIAGNOSIS — Z95828 Presence of other vascular implants and grafts: Secondary | ICD-10-CM

## 2021-04-01 DIAGNOSIS — Z170 Estrogen receptor positive status [ER+]: Secondary | ICD-10-CM

## 2021-04-01 DIAGNOSIS — Z5112 Encounter for antineoplastic immunotherapy: Secondary | ICD-10-CM | POA: Diagnosis not present

## 2021-04-01 DIAGNOSIS — C50411 Malignant neoplasm of upper-outer quadrant of right female breast: Secondary | ICD-10-CM | POA: Diagnosis not present

## 2021-04-01 LAB — CMP (CANCER CENTER ONLY)
ALT: 17 U/L (ref 0–44)
AST: 25 U/L (ref 15–41)
Albumin: 4.3 g/dL (ref 3.5–5.0)
Alkaline Phosphatase: 52 U/L (ref 38–126)
Anion gap: 7 (ref 5–15)
BUN: 11 mg/dL (ref 6–20)
CO2: 25 mmol/L (ref 22–32)
Calcium: 9.3 mg/dL (ref 8.9–10.3)
Chloride: 109 mmol/L (ref 98–111)
Creatinine: 0.67 mg/dL (ref 0.44–1.00)
GFR, Estimated: >60 mL/min (ref >60)
Glucose, Bld: 125 mg/dL — ABNORMAL HIGH (ref 70–99)
Potassium: 4.1 mmol/L (ref 3.5–5.1)
Sodium: 141 mmol/L (ref 135–145)
Total Bilirubin: 0.4 mg/dL (ref 0.3–1.2)
Total Protein: 6.9 g/dL (ref 6.5–8.1)

## 2021-04-01 LAB — CBC WITH DIFFERENTIAL (CANCER CENTER ONLY)
Abs Immature Granulocytes: 0.02 10*3/uL (ref 0.00–0.07)
Basophils Absolute: 0 10*3/uL (ref 0.0–0.1)
Basophils Relative: 1 %
Eosinophils Absolute: 0.1 10*3/uL (ref 0.0–0.5)
Eosinophils Relative: 2 %
HCT: 30.7 % — ABNORMAL LOW (ref 36.0–46.0)
Hemoglobin: 10.6 g/dL — ABNORMAL LOW (ref 12.0–15.0)
Immature Granulocytes: 1 %
Lymphocytes Relative: 54 %
Lymphs Abs: 2.1 10*3/uL (ref 0.7–4.0)
MCH: 28.9 pg (ref 26.0–34.0)
MCHC: 34.5 g/dL (ref 30.0–36.0)
MCV: 83.7 fL (ref 80.0–100.0)
Monocytes Absolute: 0.2 10*3/uL (ref 0.1–1.0)
Monocytes Relative: 6 %
Neutro Abs: 1.4 10*3/uL — ABNORMAL LOW (ref 1.7–7.7)
Neutrophils Relative %: 36 %
Platelet Count: 232 10*3/uL (ref 150–400)
RBC: 3.67 MIL/uL — ABNORMAL LOW (ref 3.87–5.11)
RDW: 12.4 % (ref 11.5–15.5)
WBC Count: 3.9 10*3/uL — ABNORMAL LOW (ref 4.0–10.5)
nRBC: 0 % (ref 0.0–0.2)

## 2021-04-01 MED ORDER — SODIUM CHLORIDE 0.9 % IV SOLN: Freq: Once | INTRAVENOUS | Status: AC

## 2021-04-01 MED ORDER — SODIUM CHLORIDE 0.9% FLUSH
10.0000 mL | Freq: Once | INTRAVENOUS | Status: AC
  Administered 2021-04-01: 10:00:00 10 mL

## 2021-04-01 MED ORDER — TRASTUZUMAB-ANNS CHEMO 150 MG IV SOLR
2.0000 mg/kg | Freq: Once | INTRAVENOUS | Status: AC
  Administered 2021-04-01: 12:00:00 105 mg via INTRAVENOUS
  Filled 2021-04-01: qty 5

## 2021-04-01 MED ORDER — HEPARIN SOD (PORK) LOCK FLUSH 100 UNIT/ML IV SOLN
500.0000 [IU] | Freq: Once | INTRAVENOUS | Status: AC | PRN
  Administered 2021-04-01: 14:00:00 500 [IU]

## 2021-04-01 MED ORDER — ACETAMINOPHEN 325 MG PO TABS
650.0000 mg | ORAL_TABLET | Freq: Once | ORAL | Status: AC
  Administered 2021-04-01: 11:00:00 650 mg via ORAL
  Filled 2021-04-01: qty 2

## 2021-04-01 MED ORDER — SODIUM CHLORIDE 0.9% FLUSH
10.0000 mL | INTRAVENOUS | Status: DC | PRN
  Administered 2021-04-01: 14:00:00 10 mL

## 2021-04-01 MED ORDER — DIPHENHYDRAMINE HCL 50 MG/ML IJ SOLN
25.0000 mg | Freq: Once | INTRAMUSCULAR | Status: AC
  Administered 2021-04-01: 11:00:00 25 mg via INTRAVENOUS
  Filled 2021-04-01: qty 1

## 2021-04-01 MED ORDER — PACLITAXEL PROTEIN-BOUND CHEMO INJECTION 100 MG
100.0000 mg/m2 | Freq: Once | INTRAVENOUS | Status: AC
  Administered 2021-04-01: 13:00:00 150 mg via INTRAVENOUS
  Filled 2021-04-01: qty 30

## 2021-04-01 NOTE — Progress Notes (Signed)
OK to treat with ANC of 1.4 per Lanora Manis NP

## 2021-04-01 NOTE — Progress Notes (Signed)
[facility]   Telephone:([phone]) [phone] Fax:(336) [phone]   Clinic Follow up Note   Patient Care Team: Copland, Gay Filler, MD as PCP - General (Family Medicine) Erroll Luna, MD as Consulting Physician (General Surgery) Truitt Merle, MD as Consulting Physician (Hematology) Eppie Gibson, MD as Attending Physician (Radiation Oncology) Mauro Kaufmann, RN as Oncology Nurse Navigator [name], RN as Oncology Nurse Navigator 04/01/2021  CHIEF COMPLAINT: Follow-up right breast cancer  SUMMARY OF ONCOLOGIC HISTORY: Oncology History Overview Note   Cancer Staging  Malignant neoplasm of upper-outer quadrant of right breast in female, estrogen receptor positive ([place]) Staging form: Breast, AJCC 8th Edition - Clinical stage from 01/08/2021: Stage IA (cT1b, cN0, cM0, G2, ER+, PR+, HER2+) - Signed by Truitt Merle, MD on 01/15/2021 - Pathologic stage from 01/23/2021: Stage IA (pT1c, pN0, cM0, G2, ER+, PR+, HER2+) - Signed by Truitt Merle, MD on 02/13/2021     Malignant neoplasm of upper-outer quadrant of right breast in female, estrogen receptor positive ([place])  01/02/2021 Mammogram   EXAM: DIGITAL DIAGNOSTIC BILATERAL MAMMOGRAM WITH TOMOSYNTHESIS AND CAD; ULTRASOUND LEFT BREAST LIMITED; ULTRASOUND RIGHT BREAST LIMITED  IMPRESSION: 1. Highly suspicious 0.7 cm UPPER-OUTER RIGHT breast mass with associated suspicious calcifications extending 1.5 cm anteriorly. Tissue sampling is recommended. 2. No abnormal appearing RIGHT axillary lymph nodes. 3. Benign LOWER LEFT breast cyst corresponding to the LEFT breast screening study finding.   01/08/2021 Cancer Staging   Staging form: Breast, AJCC 8th Edition - Clinical stage from 01/08/2021: Stage IA (cT1b, cN0, cM0, G2, ER+, PR+, HER2+) - Signed by Truitt Merle, MD on 01/15/2021 Stage prefix: Initial diagnosis Nuclear grade: G2 Histologic grading system: 3 grade system    01/08/2021 Pathology Results   Diagnosis Breast, right, needle  core biopsy, upper outer quadrant, 11 o'clock, 5cmfn, ribbon clip - INVASIVE DUCTAL CARCINOMA - DUCTAL CARCINOMA IN SITU WITH CALCIFICATIONS - SEE COMMENT Microscopic Comment Based on the biopsy, the carcinoma appears Nottingham grade 2 of 3 and measures 0.6 cm in greatest linear extent.  PROGNOSTIC INDICATORS Results: The tumor cells are 2+ for Her2 (EQUIVOCAL). Her2 by FISH will be performed and results reported separately. Estrogen Receptor: 100%, POSITIVE, STRONG STAINING INTENSITY Progesterone Receptor: 70%, POSITIVE, STRONG STAINING INTENSITY Proliferation Marker Ki67: 10%  FLUORESCENCE IN-SITU HYBRIDIZATION Results: GROUP 1: HER2 **POSITIVE**   01/13/2021 Initial Diagnosis   Malignant neoplasm of upper-outer quadrant of right breast in female, estrogen receptor positive ([place])   01/15/2021 Genetic Testing   [facility] CustomNext Panel was Negative. Report date is 01/26/2021.  The CustomNext-Cancer+RNAinsight panel offered by [facility] includes sequencing and rearrangement analysis for the following 47 genes:  APC, ATM, AXIN2, BARD1, BMPR1A, BRCA1, BRCA2, BRIP1, CDH1, CDK4, CDKN2A, CHEK2, CTNNA1, DICER1, EPCAM, GREM1, HOXB13, KIT, MEN1, MLH1, MSH2, MSH3, MSH6, MUTYH, NBN, NF1, NTHL1, PALB2, PDGFRA, PMS2, POLD1, POLE, PTEN, RAD50, RAD51C, RAD51D, SDHA, SDHB, SDHC, SDHD, SMAD4, SMARCA4, STK11, TP53, TSC1, TSC2, and VHL.  RNA data is routinely analyzed for use in variant interpretation for all genes.   01/23/2021 Cancer Staging   Staging form: Breast, AJCC 8th Edition - Pathologic stage from 01/23/2021: Stage IA (pT1c, pN0, cM0, G2, ER+, PR+, HER2+) - Signed by Truitt Merle, MD on 02/13/2021 Histologic grading system: 3 grade system Residual tumor (R): R0 - None    01/23/2021 Definitive Surgery   FINAL MICROSCOPIC DIAGNOSIS:   A. BREAST, LEFT, MASTECTOMY:  - Fibrocystic changes with sclerosing adenosis and calcifications.  - Fibroadenoma (0.6 cm).  - No evidence of malignancy.  B. BREAST, RIGHT, MASTECTOMY:  - Invasive and in situ ductal carcinoma, 1.7 cm.  - Margins negative for carcinoma.  - Biopsy site and biopsy clip.  - See oncology table.   C. LYMPH NODE, RIGHT AXILLARY, SENTINEL, EXCISION:  - One lymph node negative for metastatic carcinoma (0/1).   D. LYMPH NODE, RIGHT AXILLARY, SENTINEL, EXCISION:  - One lymph node negative for metastatic carcinoma (0/1).   E. LYMPH NODE, RIGHT AXILLARY, SENTINEL, EXCISION:  - One lymph node negative for metastatic carcinoma (0/1).   F. LYMPH NODE, RIGHT AXILLARY, SENTINEL, EXCISION:  - One lymph node negative for metastatic carcinoma (0/1).    02/25/2021 -  Chemotherapy   Patient is on Treatment Plan : BREAST Paclitaxel + Trastuzumab q7d / Trastuzumab q21d       CURRENT THERAPY: Adjuvant Abraxane/Herceptin weekly x12, starting 02/25/2021, followed by maintenance Herceptin every 3 weeks to total a year  INTERVAL HISTORY: Dana Kramer returns for follow-up and treatment as scheduled, last seen by Dr. Burr Medico 03/18/2021.  She has completed 5 weeks of Abraxane/Herceptin.  She feels well today, energy and appetite are adequate.  She is bothered by the metallic taste especially when drinking coffee, denies mucositis.  On the third day after treatment she has fatigue and nausea without vomiting.  She recovers well.  Bowels moving normally.  She is motivated to get back to work after she completes treatment.  Mild lymphedema at the mastectomy sites, applying Ace wrap and undergoing PT.  She also massages the chest wall.  Denies new lump/mass or any concerns.  Otherwise denies fever, chills, cough, chest pain, dyspnea, leg edema, rash, neuropathy.  She notes Dr. Ernestina Penna tuning fork exam was decreased but patient denies any numbness or tingling.   MEDICAL HISTORY:  Past Medical History:  Diagnosis Date   Allergy 11/1998   Penicillin   Chicken pox    History of transfusion of whole blood    Hyperlipidemia    Malignant neoplasm  of upper-outer quadrant of right breast in female, estrogen receptor positive ([place]) 01/13/2021   Migraine    Thyroid disease    Hypothyroidism    SURGICAL HISTORY: Past Surgical History:  Procedure Laterality Date   ABDOMINOPLASTY  10/01/2010   CESAREAN SECTION     twice   HERNIA REPAIR     PORTACATH PLACEMENT Right 01/23/2021   Procedure: Right internal jugular 8 [place] PowerPort with C arm and ultrasound guidance;  Surgeon: Erroll Luna, MD;  Location: [facility];  Service: General;  Laterality: Right;   SENTINEL NODE BIOPSY Right 01/23/2021   Procedure: Right axillary sentinel lymph node mapping using Mag Trace;  Surgeon: Erroll Luna, MD;  Location: [facility];  Service: General;  Laterality: Right;   SIMPLE MASTECTOMY WITH AXILLARY SENTINEL NODE BIOPSY Bilateral 01/23/2021   Procedure: Right simple mastectomy ;  Left risk reducing simple mastectomy;  Surgeon: Erroll Luna, MD;  Location: [facility];  Service: General;  Laterality: Bilateral;   TUBAL LIGATION     umblica hernia repair  01/09/1593    I have reviewed the social history and family history with the patient and they are unchanged from previous note.  ALLERGIES:  is allergic to penicillins.  MEDICATIONS:  Current Outpatient Medications  Medication Sig Dispense Refill   amLODipine (NORVASC) 5 MG tablet Take 1 tablet (5 mg total) by mouth daily. 90 tablet 3   b complex vitamins capsule Take 1 capsule by mouth daily.     Calcium Citrate-Vitamin D (CALCIUM + D PO)  Take 1 tablet by mouth daily.     Cholecalciferol (VITAMIN D PO) Take 1 tablet by mouth daily.     cyclobenzaprine (FLEXERIL) 5 MG tablet Take 1 tablet (5 mg total) by mouth 3 (three) times daily as needed for muscle spasms. 10 tablet 0   ibuprofen (ADVIL) 800 MG tablet Take 1 tablet (800 mg total) by mouth every 8 (eight) hours as needed. 30 tablet 0   Ibuprofen-diphenhydrAMINE HCl (ADVIL PM) 200-25 MG CAPS Take 2 tablets by mouth daily as needed (pain).      levothyroxine (SYNTHROID) 25 MCG tablet TAKE 1 TABLET(25 MCG) BY MOUTH DAILY BEFORE BREAKFAST 90 tablet 3   lidocaine-prilocaine (EMLA) cream Apply 1 application topically as needed (To be applied to portacath 30 to 60 mins prior to portacath access). 30 g 2   Melatonin 10 MG CAPS Take 10 mg by mouth at bedtime.     metoprolol succinate (TOPROL-XL) 50 MG 24 hr tablet Take 1 tablet (50 mg total) by mouth daily. Take with or immediately following a meal. 90 tablet 3   Multiple Vitamins-Minerals (WOMENS MULTIVITAMIN PO) Take 1 tablet by mouth daily.     omeprazole (PRILOSEC OTC) 20 MG tablet Take 20 mg by mouth daily.     ondansetron (ZOFRAN) 8 MG tablet Take 1 tablet (8 mg total) by mouth 2 (two) times daily as needed (Nausea or vomiting). 30 tablet 1   oxyCODONE (OXY IR/ROXICODONE) 5 MG immediate release tablet Take 1 tablet (5 mg total) by mouth every 6 (six) hours as needed for severe pain. 15 tablet 0   prochlorperazine (COMPAZINE) 10 MG tablet Take 1 tablet (10 mg total) by mouth every 6 (six) hours as needed (Nausea or vomiting). 30 tablet 1   simvastatin (ZOCOR) 20 MG tablet TAKE 1 TABLET(20 MG) BY MOUTH AT BEDTIME 90 tablet 3   No current facility-administered medications for this visit.    PHYSICAL EXAMINATION: ECOG PERFORMANCE STATUS: 1 - Symptomatic but completely ambulatory  Vitals:   04/01/21 1000  BP: (!) 137/99  Pulse: 95  Resp: 17  Temp: 98.2 F (36.8 C)  SpO2: 100%   Filed Weights   04/01/21 1000  Weight: 126 lb 6.4 oz (57.3 kg)    GENERAL:alert, no distress and comfortable SKIN: No rash EYES: sclera clear LUNGS:  normal breathing effort HEART: no lower extremity edema NEURO: alert & oriented x 3 with fluent speech, no focal motor deficits Breast exam: Limited to visualization which reveals healed bilateral mastectomy incisions PAC without erythema  LABORATORY DATA:  I have reviewed the data as listed CBC Latest Ref Rng & Units 04/01/2021 03/25/2021 03/18/2021   WBC 4.0 - 10.5 K/uL 3.9(L) 4.2 3.9(L)  Hemoglobin 12.0 - 15.0 g/dL 10.6(L) 11.3(L) 10.8(L)  Hematocrit 36.0 - 46.0 % 30.7(L) 32.5(L) 32.3(L)  Platelets 150 - 400 K/uL 232 256 242     CMP Latest Ref Rng & Units 04/01/2021 03/25/2021 03/18/2021  Glucose 70 - 99 mg/dL 125(H) 113(H) 129(H)  BUN 6 - 20 mg/dL 11 10 14   Creatinine 0.44 - 1.00 mg/dL 0.67 0.68 0.69  Sodium 135 - 145 mmol/L 141 140 140  Potassium 3.5 - 5.1 mmol/L 4.1 4.4 4.0  Chloride 98 - 111 mmol/L 109 105 106  CO2 22 - 32 mmol/L 25 27 25   Calcium 8.9 - 10.3 mg/dL 9.3 9.7 9.3  Total Protein 6.5 - 8.1 g/dL 6.9 7.2 6.9  Total Bilirubin 0.3 - 1.2 mg/dL 0.4 0.3 0.3  Alkaline Phos 38 - 126  U/L 52 54 49  AST 15 - 41 U/L 25 21 24   ALT 0 - 44 U/L 17 13 18       RADIOGRAPHIC STUDIES: I have personally reviewed the radiological images as listed and agreed with the findings in the report. No results found.   ASSESSMENT & PLAN: Dana Kramer is a 56 y.o. female with    1. Malignant neoplasm of upper-outer quadrant of right breast, Stage IA, c(T1bN0M0), Triple positive, Grade 2 -screening detected 0.7 cm upper-outer right breast mass with associated calcifications extending 1.5 cm. No abnormal right lymph nodes or left malignancy. 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 -S/p b/l mastectomies on 01/23/21 with Dr. Brantley Stage. 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  Port was placed during surgery -Due to Her2+ disease adjuvant chemotherapy was recommended, she began weekly Abraxane/Herceptin x12 weeks on 02/25/21, to be followed by maintenance Herceptin every 3 weeks to total a year.  -Treatment plan includes adjuvant antiestrogen therapy to follow.  She would not need postmastectomy radiation -Baseline EF 60-65% with impaired relaxation (grade I diastolic dysfunction). Will monitor q3 months on herceptin   2.  Elevated blood pressure and anxiety -She has been very anxious since her  diagnosis, BP elevated in clinic last few visits -no previous HTN diagnosis or treatment.  She is on metoprolol for tachycardia. -She will check BP at home and let us know if it remains elevated    Disposition: Dana Kramer appears stable.  S/p 5 cycles of weekly Abraxane/Herceptin, tolerating well with mild fatigue and nausea on day 3.  Side effects are well managed with supportive care at home.  She is able to recover and function well.  Labs reviewed, mild anemia is stable, ANC 1.4, CMP unremarkable.  Adequate to proceed with cycle 6 weekly Abraxane/Herceptin today as planned, no dose adjustments. Will add filgrastim when ANC </= 1.0.   She will return for lab and cycle 7 next week, then follow-up in 2 weeks with cycle 8.  We discussed if she has progressive neuropathy we will recommend to dose reduce Abraxane or stop it early.  All questions were answered. The patient knows to call the clinic with any problems, questions or concerns. No barriers to learning was detected.      Alla Feeling, NP 04/01/21

## 2021-04-02 ENCOUNTER — Ambulatory Visit: Payer: Managed Care, Other (non HMO)

## 2021-04-02 DIAGNOSIS — M25612 Stiffness of left shoulder, not elsewhere classified: Secondary | ICD-10-CM

## 2021-04-02 DIAGNOSIS — R293 Abnormal posture: Secondary | ICD-10-CM

## 2021-04-02 DIAGNOSIS — R600 Localized edema: Secondary | ICD-10-CM

## 2021-04-02 DIAGNOSIS — Z483 Aftercare following surgery for neoplasm: Secondary | ICD-10-CM

## 2021-04-02 DIAGNOSIS — M25611 Stiffness of right shoulder, not elsewhere classified: Secondary | ICD-10-CM

## 2021-04-02 DIAGNOSIS — Z170 Estrogen receptor positive status [ER+]: Secondary | ICD-10-CM

## 2021-04-02 DIAGNOSIS — C50411 Malignant neoplasm of upper-outer quadrant of right female breast: Secondary | ICD-10-CM | POA: Diagnosis not present

## 2021-04-02 NOTE — Therapy (Signed)
[facility] @ [facility] [place], [place], [address] Phone: [phone]   Fax:  [phone]  Physical Therapy Treatment  Patient Details  Name: Dana Kramer MRN: [id number] Date of Birth: 11-Oct-1965 Referring Provider (PT): Dr. Erroll Luna   Encounter Date: 04/02/2021   PT End of Session - 04/02/21 0924     Visit Number 10    Number of Visits 16    Date for PT Re-Evaluation 05/07/21    PT Start Time 0902    PT Stop Time 8527    PT Time Calculation (min) 60 min    Activity Tolerance Patient tolerated treatment well    Behavior During Therapy [facility] for tasks assessed/performed             Past Medical History:  Diagnosis Date   Allergy 11/1998   Penicillin   Chicken pox    History of transfusion of whole blood    Hyperlipidemia    Malignant neoplasm of upper-outer quadrant of right breast in female, estrogen receptor positive ([place]) 01/13/2021   Migraine    Thyroid disease    Hypothyroidism    Past Surgical History:  Procedure Laterality Date   ABDOMINOPLASTY  10/01/2010   CESAREAN SECTION     twice   HERNIA REPAIR     PORTACATH PLACEMENT Right 01/23/2021   Procedure: Right internal jugular 8 [place] PowerPort with C arm and ultrasound guidance;  Surgeon: Erroll Luna, MD;  Location: [facility];  Service: General;  Laterality: Right;   SENTINEL NODE BIOPSY Right 01/23/2021   Procedure: Right axillary sentinel lymph node mapping using Mag Trace;  Surgeon: Erroll Luna, MD;  Location: [facility];  Service: General;  Laterality: Right;   SIMPLE MASTECTOMY WITH AXILLARY SENTINEL NODE BIOPSY Bilateral 01/23/2021   Procedure: Right simple mastectomy ;  Left risk reducing simple mastectomy;  Surgeon: Erroll Luna, MD;  Location: [facility];  Service: General;  Laterality: Bilateral;   TUBAL LIGATION     umblica hernia repair  78/24/2353    There were no vitals filed for this visit.   Subjective Assessment - 04/02/21  0907     Subjective I have been doing well since I was here. The new exercises with the theraband are going really well. I'm still waiting for the compression sleeve to arrive at [facility]. I call every time I leave here. And I am scheduled to return to work at this time on Mar. 13    Pertinent History Patient was diagnosed on 12/10/2020 with right grade II invasive ductal carcinoma breast cancer. She underwent a bilateral mastectomy with a right sentinel node biopsy (4 negative nodes) on 01/23/2021. It is ER/PR positive and HER2 negative with a Ki67 of 10%.    Patient Stated Goals Regain shoulder function    Currently in Pain? No/denies                               [facility] Adult PT Treatment/Exercise - 04/02/21 0001       Shoulder Exercises: Supine   Horizontal ABduction Strengthening;Both;10 reps;Theraband    Theraband Level (Shoulder Horizontal ABduction) Level 2 (Red)    External Rotation Strengthening;Both;10 reps;Theraband    Theraband Level (Shoulder External Rotation) Level 2 (Red)    External Rotation Limitations Demo and tactile cues to keep elbows at side and work towards full ROM    Flexion Strengthening;Both;10 reps;Theraband   Narrow and Wide  Grip, x10 each   Theraband Level (Shoulder Flexion) Level 2 (Red)    Diagonals Strengthening;Right;Left;10 reps;Theraband    Theraband Level (Shoulder Diagonals) Level 2 (Red)      Shoulder Exercises: Pulleys   Flexion 2 minutes    ABduction 2 minutes      Shoulder Exercises: Therapy Ball   Flexion Both;10 reps   1# added to each wrist an dforward lean into end of stretch   ABduction Right;10 reps   1# added to wrist and same side lean into end of stretch     Shoulder Exercises: Stretch   Other Shoulder Stretches Modified downward dog on wall returning therapist demo 5x, 5-10 sec holds      Manual Therapy   Myofascial Release to Rt cording along axill and upper arm    Passive ROM In Supine to Rt shoulder  into flexion, abduction, and D2 flex                          PT Long Term Goals - 03/26/21 0910       PT LONG TERM GOAL #1   Title Patient will demonstrate she has regained full shoulder ROM and function post operatively compared to baselines.    Baseline Rt Flex: 160, abd: 180,    Lt Flex: 155, Abd: 180    Status Partially Met      PT LONG TERM GOAL #2   Title Patient will increase bilateral shoulder flexion to >/= 150 degrees for increased ease reaching.    Baseline 5 deg lacking on Lt    Status Partially Met      PT LONG TERM GOAL #3   Title Patient will increase bil shoulder abduction to >/= 160 degrees for increased ease with normal daily tasks and work tasks.    Status Partially Met      PT LONG TERM GOAL #4   Title Patient will be independent with her HEP for shoulder ROM.    Status On-going      PT LONG TERM GOAL #5   Title Patient will verbalize good understanding of lymphedema risk reduction practices after attending the After Breast Cancer Class.    Status Achieved                   Plan - 04/02/21 0925     Clinical Impression Statement Continued with progressing ROM and postural strengthening exercises. Pt did well wtih progression of these today and reports feeling challenged as well. Also continued with manaul therapy working to decrease cording at end Rt shoulder motions.    Stability/Clinical Decision Making Stable/Uncomplicated    Rehab Potential Excellent    PT Frequency 1x / week    PT Duration 6 weeks    PT Treatment/Interventions ADLs/Self Care Home Management;Therapeutic exercise;Patient/family education;Manual techniques;Manual lymph drainage;Passive range of motion;Scar mobilization;Therapeutic activities    PT Next Visit Plan PROM bil shoulders and AAROM exercises, Rt cording release, educated on advancing supine scap and not doing every day.    PT Home Exercise Plan Post op HEP, supine wand flexion and scaption, sup scap     Consulted and Agree with Plan of Care Patient             Patient will benefit from skilled therapeutic intervention in order to improve the following deficits and impairments:  Postural dysfunction, Decreased range of motion, Decreased knowledge of precautions, Impaired UE functional use, Pain, Increased edema, Decreased scar mobility, Increased  fascial restricitons  Visit Diagnosis: Malignant neoplasm of upper-outer quadrant of right breast in female, estrogen receptor positive ([place])  Abnormal posture  Stiffness of right shoulder, not elsewhere classified  Stiffness of left shoulder, not elsewhere classified  Localized edema  Aftercare following surgery for neoplasm     Problem List Patient Active Problem List   Diagnosis Date Noted   Port-A-Cath in place 03/04/2021   Genetic testing 01/27/2021   S/P mastectomy, bilateral 01/23/2021   Family history of colon cancer 01/15/2021   Malignant neoplasm of upper-outer quadrant of right breast in female, estrogen receptor positive ([facility]) 01/13/2021   Pre-diabetes 11/06/2020   Acquired hypothyroidism 04/14/2018   Migraine 01/18/2015   Insomnia 11/05/2011   Anxiety 11/05/2011   Hyperlipidemia 11/05/2011   Tachycardia 83/23/4688   Umbilical hernia 73/73/0816    Otelia Limes, PTA 04/02/2021, 10:03 AM  [facility] @ [facility] [place], [place], [address] Phone: [phone]   Fax:  [phone]  Name: Dana Kramer MRN: [id number] Date of Birth: Jul 24, 1965

## 2021-04-08 ENCOUNTER — Inpatient Hospital Stay: Payer: Managed Care, Other (non HMO)

## 2021-04-08 ENCOUNTER — Other Ambulatory Visit: Payer: Self-pay

## 2021-04-08 VITALS — BP 160/96 | HR 91 | Temp 98.0°F | Resp 18 | Ht 60.0 in | Wt 124.2 lb

## 2021-04-08 DIAGNOSIS — Z5112 Encounter for antineoplastic immunotherapy: Secondary | ICD-10-CM | POA: Diagnosis not present

## 2021-04-08 DIAGNOSIS — Z170 Estrogen receptor positive status [ER+]: Secondary | ICD-10-CM

## 2021-04-08 DIAGNOSIS — Z95828 Presence of other vascular implants and grafts: Secondary | ICD-10-CM

## 2021-04-08 DIAGNOSIS — C50411 Malignant neoplasm of upper-outer quadrant of right female breast: Secondary | ICD-10-CM

## 2021-04-08 LAB — CMP (CANCER CENTER ONLY)
ALT: 12 U/L (ref 0–44)
AST: 20 U/L (ref 15–41)
Albumin: 4.4 g/dL (ref 3.5–5.0)
Alkaline Phosphatase: 48 U/L (ref 38–126)
Anion gap: 7 (ref 5–15)
BUN: 14 mg/dL (ref 6–20)
CO2: 26 mmol/L (ref 22–32)
Calcium: 9.5 mg/dL (ref 8.9–10.3)
Chloride: 106 mmol/L (ref 98–111)
Creatinine: 0.73 mg/dL (ref 0.44–1.00)
GFR, Estimated: >60 mL/min (ref >60)
Glucose, Bld: 101 mg/dL — ABNORMAL HIGH (ref 70–99)
Potassium: 4.1 mmol/L (ref 3.5–5.1)
Sodium: 139 mmol/L (ref 135–145)
Total Bilirubin: 0.4 mg/dL (ref 0.3–1.2)
Total Protein: 7 g/dL (ref 6.5–8.1)

## 2021-04-08 LAB — CBC WITH DIFFERENTIAL (CANCER CENTER ONLY)
Abs Immature Granulocytes: 0.03 10*3/uL (ref 0.00–0.07)
Basophils Absolute: 0 10*3/uL (ref 0.0–0.1)
Basophils Relative: 1 %
Eosinophils Absolute: 0.1 10*3/uL (ref 0.0–0.5)
Eosinophils Relative: 2 %
HCT: 31.8 % — ABNORMAL LOW (ref 36.0–46.0)
Hemoglobin: 10.9 g/dL — ABNORMAL LOW (ref 12.0–15.0)
Immature Granulocytes: 1 %
Lymphocytes Relative: 53 %
Lymphs Abs: 2.4 10*3/uL (ref 0.7–4.0)
MCH: 28.8 pg (ref 26.0–34.0)
MCHC: 34.3 g/dL (ref 30.0–36.0)
MCV: 83.9 fL (ref 80.0–100.0)
Monocytes Absolute: 0.3 10*3/uL (ref 0.1–1.0)
Monocytes Relative: 7 %
Neutro Abs: 1.6 10*3/uL — ABNORMAL LOW (ref 1.7–7.7)
Neutrophils Relative %: 36 %
Platelet Count: 241 10*3/uL (ref 150–400)
RBC: 3.79 MIL/uL — ABNORMAL LOW (ref 3.87–5.11)
RDW: 12.6 % (ref 11.5–15.5)
WBC Count: 4.5 10*3/uL (ref 4.0–10.5)
nRBC: 0 % (ref 0.0–0.2)

## 2021-04-08 MED ORDER — PACLITAXEL PROTEIN-BOUND CHEMO INJECTION 100 MG
100.0000 mg/m2 | Freq: Once | INTRAVENOUS | Status: AC
  Administered 2021-04-08: 150 mg via INTRAVENOUS
  Filled 2021-04-08: qty 30

## 2021-04-08 MED ORDER — SODIUM CHLORIDE 0.9% FLUSH
10.0000 mL | Freq: Once | INTRAVENOUS | Status: AC
  Administered 2021-04-08: 10 mL

## 2021-04-08 MED ORDER — HEPARIN SOD (PORK) LOCK FLUSH 100 UNIT/ML IV SOLN
500.0000 [IU] | Freq: Once | INTRAVENOUS | Status: AC | PRN
  Administered 2021-04-08: 500 [IU]

## 2021-04-08 MED ORDER — DIPHENHYDRAMINE HCL 50 MG/ML IJ SOLN
25.0000 mg | Freq: Once | INTRAMUSCULAR | Status: AC
  Administered 2021-04-08: 25 mg via INTRAVENOUS
  Filled 2021-04-08: qty 1

## 2021-04-08 MED ORDER — SODIUM CHLORIDE 0.9 % IV SOLN: Freq: Once | INTRAVENOUS | Status: AC

## 2021-04-08 MED ORDER — SODIUM CHLORIDE 0.9% FLUSH
10.0000 mL | INTRAVENOUS | Status: DC | PRN
  Administered 2021-04-08: 10 mL

## 2021-04-08 MED ORDER — ACETAMINOPHEN 325 MG PO TABS
650.0000 mg | ORAL_TABLET | Freq: Once | ORAL | Status: AC
  Administered 2021-04-08: 650 mg via ORAL

## 2021-04-08 MED ORDER — TRASTUZUMAB-ANNS CHEMO 150 MG IV SOLR
2.0000 mg/kg | Freq: Once | INTRAVENOUS | Status: AC
  Administered 2021-04-08: 105 mg via INTRAVENOUS
  Filled 2021-04-08: qty 5

## 2021-04-08 NOTE — Patient Instructions (Signed)
[facility] ONCOLOGY  Discharge Instructions: Thank you for choosing [facility] to provide your oncology and hematology care.   If you have a lab appointment with the [facility], please go directly to the [facility] and check in at the registration area.   Wear comfortable clothing and clothing appropriate for easy access to any Portacath or PICC line.   We strive to give you quality time with your provider. You may need to reschedule your appointment if you arrive late (15 or more minutes).  Arriving late affects you and other patients whose appointments are after yours.  Also, if you miss three or more appointments without notifying the office, you may be dismissed from the clinic at the providers discretion.      For prescription refill requests, have your pharmacy contact our office and allow 72 hours for refills to be completed.    Today you received the following chemotherapy and/or immunotherapy agents: Trastuzumab and Abraxane      To help prevent nausea and vomiting after your treatment, we encourage you to take your nausea medication as directed.  BELOW ARE SYMPTOMS THAT SHOULD BE REPORTED IMMEDIATELY: *FEVER GREATER THAN 100.4 F (38 C) OR HIGHER *CHILLS OR SWEATING *NAUSEA AND VOMITING THAT IS NOT CONTROLLED WITH YOUR NAUSEA MEDICATION *UNUSUAL SHORTNESS OF BREATH *UNUSUAL BRUISING OR BLEEDING *URINARY PROBLEMS (pain or burning when urinating, or frequent urination) *BOWEL PROBLEMS (unusual diarrhea, constipation, pain near the anus) TENDERNESS IN MOUTH AND THROAT WITH OR WITHOUT PRESENCE OF ULCERS (sore throat, sores in mouth, or a toothache) UNUSUAL RASH, SWELLING OR PAIN  UNUSUAL VAGINAL DISCHARGE OR ITCHING   Items with * indicate a potential emergency and should be followed up as soon as possible or go to the Emergency Department if any problems should occur.  Please show the CHEMOTHERAPY ALERT CARD or IMMUNOTHERAPY ALERT CARD  at check-in to the Emergency Department and triage nurse.  Should you have questions after your visit or need to cancel or reschedule your appointment, please contact [facility]  Dept: [phone]  and follow the prompts.  Office hours are 8:00 a.m. to 4:30 p.m. Monday - Friday. Please note that voicemails left after 4:00 p.m. may not be returned until the following business day.  We are closed weekends and major holidays. You have access to a nurse at all times for urgent questions. Please call the main number to the clinic Dept: [phone] and follow the prompts.   For any non-urgent questions, you may also contact your provider using MyChart. We now offer e-Visits for anyone 75 and older to request care online for non-urgent symptoms. For details visit [email or web address].   Also download the MyChart app! Go to the app store, search "MyChart", open the app, select [facility], and log in with your MyChart username and password.  Due to Covid, a mask is required upon entering the hospital/clinic. If you do not have a mask, one will be given to you upon arrival. For doctor visits, patients may have 1 support person aged 52 or older with them. For treatment visits, patients cannot have anyone with them due to current Covid guidelines and our immunocompromised population.

## 2021-04-09 ENCOUNTER — Ambulatory Visit: Payer: Managed Care, Other (non HMO) | Attending: Surgery

## 2021-04-09 DIAGNOSIS — M25612 Stiffness of left shoulder, not elsewhere classified: Secondary | ICD-10-CM

## 2021-04-09 DIAGNOSIS — C50411 Malignant neoplasm of upper-outer quadrant of right female breast: Secondary | ICD-10-CM

## 2021-04-09 DIAGNOSIS — R600 Localized edema: Secondary | ICD-10-CM | POA: Diagnosis present

## 2021-04-09 DIAGNOSIS — R293 Abnormal posture: Secondary | ICD-10-CM

## 2021-04-09 DIAGNOSIS — M25611 Stiffness of right shoulder, not elsewhere classified: Secondary | ICD-10-CM

## 2021-04-09 DIAGNOSIS — Z170 Estrogen receptor positive status [ER+]: Secondary | ICD-10-CM | POA: Insufficient documentation

## 2021-04-09 DIAGNOSIS — Z483 Aftercare following surgery for neoplasm: Secondary | ICD-10-CM | POA: Diagnosis present

## 2021-04-09 NOTE — Therapy (Signed)
[facility] @ [address] [place], [place], [address] Phone: [phone]   Fax:  [phone]  Physical Therapy Treatment  Patient Details  Name: Dana Kramer MRN: [id number] Date of Birth: 12-22-1965 Referring Provider (PT): Dr. Erroll Luna   Encounter Date: 04/09/2021   PT End of Session - 04/09/21 1000     Visit Number 11    Number of Visits 16    Date for PT Re-Evaluation 05/07/21    PT Start Time 8127    PT Stop Time 0957    PT Time Calculation (min) 62 min    Activity Tolerance Patient tolerated treatment well    Behavior During Therapy [facility] for tasks assessed/performed             Past Medical History:  Diagnosis Date   Allergy 11/1998   Penicillin   Chicken pox    History of transfusion of whole blood    Hyperlipidemia    Malignant neoplasm of upper-outer quadrant of right breast in female, estrogen receptor positive ([place]) 01/13/2021   Migraine    Thyroid disease    Hypothyroidism    Past Surgical History:  Procedure Laterality Date   ABDOMINOPLASTY  10/01/2010   CESAREAN SECTION     twice   HERNIA REPAIR     PORTACATH PLACEMENT Right 01/23/2021   Procedure: Right internal jugular 8 [place] PowerPort with C arm and ultrasound guidance;  Surgeon: Erroll Luna, MD;  Location: [place];  Service: General;  Laterality: Right;   SENTINEL NODE BIOPSY Right 01/23/2021   Procedure: Right axillary sentinel lymph node mapping using Mag Trace;  Surgeon: Erroll Luna, MD;  Location: [place];  Service: General;  Laterality: Right;   SIMPLE MASTECTOMY WITH AXILLARY SENTINEL NODE BIOPSY Bilateral 01/23/2021   Procedure: Right simple mastectomy ;  Left risk reducing simple mastectomy;  Surgeon: Erroll Luna, MD;  Location: [place];  Service: General;  Laterality: Bilateral;   TUBAL LIGATION     umblica hernia repair  51/70/0174    There were no vitals filed for this visit.   Subjective Assessment - 04/09/21  0901     Subjective I still have days where my cording feels like it gets tighter but overall it's been getting better.    Pertinent History Patient was diagnosed on 12/10/2020 with right grade II invasive ductal carcinoma breast cancer. She underwent a bilateral mastectomy with a right sentinel node biopsy (4 negative nodes) on 01/23/2021. It is ER/PR positive and HER2 negative with a Ki67 of 10%.    Patient Stated Goals Regain shoulder function    Currently in Pain? No/denies                               [facility] Adult PT Treatment/Exercise - 04/09/21 0001       Shoulder Exercises: Standing   Other Standing Exercises Bil UE 3 way raises with 1# into flexion and scaption, then no weights with abduction as pt reports feeling pull from cording near antecubital fossa which limited her ROM so ended with "snow angel" stretch without weight with tactile cues for technique      Shoulder Exercises: Pulleys   Flexion 2 minutes   1# added to each wrist   ABduction 2 minutes   1# added to each wrist     Shoulder Exercises: Stretch   Other Shoulder Stretches Modified downward dog on wall returning therapist demo 8x,  5-10 sec holds      Manual Therapy   Myofascial Release to Rt cording along axilla and upper arm, pt with increased tenderness today    Manual Lymphatic Drainage (MLD) In Supine: Short neck, 5 diaphragmatic breaths, Rt inguinal nodes, Rt axillo-inguinal anastomosis, then Rt UE working from proximal to distal then retracing all steps and focusing at medial upper arm where cording present    Passive ROM In Supine to Rt shoulder into flexion and scaption                          PT Long Term Goals - 03/26/21 0910       PT LONG TERM GOAL #1   Title Patient will demonstrate she has regained full shoulder ROM and function post operatively compared to baselines.    Baseline Rt Flex: 160, abd: 180,    Lt Flex: 155, Abd: 180    Status Partially Met       PT LONG TERM GOAL #2   Title Patient will increase bilateral shoulder flexion to >/= 150 degrees for increased ease reaching.    Baseline 5 deg lacking on Lt    Status Partially Met      PT LONG TERM GOAL #3   Title Patient will increase bil shoulder abduction to >/= 160 degrees for increased ease with normal daily tasks and work tasks.    Status Partially Met      PT LONG TERM GOAL #4   Title Patient will be independent with her HEP for shoulder ROM.    Status On-going      PT LONG TERM GOAL #5   Title Patient will verbalize good understanding of lymphedema risk reduction practices after attending the After Breast Cancer Class.    Status Achieved                   Plan - 04/09/21 1000     Clinical Impression Statement Pt comes in reporting her cording feels a little tighter since having chemo yesterday and she did not wear her compression sleeve at all yesterday either. Her cording was more tender today with manual therapy. Did progress strengthening to include bil UE 3 way raises but did this with pts compression sleeve on and onyl used 1# with flex and scaption, not abd. Instructed pt to hold off on strengthening exercises for next few days and wear her compression sleeve daily while continuing with HEP stretches to allow cording symptoms to decrease and pt verbalized good understanding. Also instructed daughter in [place] to areas of cording in axilla and upper arm today as well per pt request.    Personal Factors and Comorbidities Age    Stability/Clinical Decision Making Stable/Uncomplicated    Rehab Potential Excellent    PT Frequency 1x / week    PT Duration 6 weeks    PT Treatment/Interventions ADLs/Self Care Home Management;Therapeutic exercise;Patient/family education;Manual techniques;Manual lymph drainage;Passive range of motion;Scar mobilization;Therapeutic activities    PT Next Visit Plan PROM bil shoulders and AAROM exercises, Rt cording release, educated on advancing  supine scap and not doing every day.    PT Home Exercise Plan Post op HEP, supine wand flexion and scaption, sup scap    Consulted and Agree with Plan of Care Patient;Family member/caregiver    Family Member Consulted daughter             Patient will benefit from skilled therapeutic intervention in order to improve the  following deficits and impairments:  Postural dysfunction, Decreased range of motion, Decreased knowledge of precautions, Impaired UE functional use, Pain, Increased edema, Decreased scar mobility, Increased fascial restricitons  Visit Diagnosis: Malignant neoplasm of upper-outer quadrant of right breast in female, estrogen receptor positive ([facility])  Abnormal posture  Stiffness of right shoulder, not elsewhere classified  Stiffness of left shoulder, not elsewhere classified  Localized edema  Aftercare following surgery for neoplasm     Problem List Patient Active Problem List   Diagnosis Date Noted   Port-A-Cath in place 03/04/2021   Genetic testing 01/27/2021   S/P mastectomy, bilateral 01/23/2021   Family history of colon cancer 01/15/2021   Malignant neoplasm of upper-outer quadrant of right breast in female, estrogen receptor positive ([place]) 01/13/2021   Pre-diabetes 11/06/2020   Acquired hypothyroidism 04/14/2018   Migraine 01/18/2015   Insomnia 11/05/2011   Anxiety 11/05/2011   Hyperlipidemia 11/05/2011   Tachycardia 35/68/6168   Umbilical hernia 37/29/0211    Otelia Limes, PTA 04/09/2021, 10:05 AM  [facility] @ [place], [place], [address] Phone: [phone]   Fax:  [phone]  Name: Dana Kramer MRN: [id number] Date of Birth: 1965-12-14

## 2021-04-13 NOTE — Progress Notes (Signed)
[facility]   [phone] [phone]   Clinic Follow up Note   Patient Care Team: Copland, Gay Filler, MD as PCP - General (Family Medicine) Erroll Luna, MD as Consulting Physician (General Surgery) Truitt Merle, MD as Consulting Physician (Hematology) Eppie Gibson, MD as Attending Physician (Radiation Oncology) Mauro Kaufmann, RN as Oncology Nurse Navigator [name], RN as Oncology Nurse Navigator 04/15/2021  CHIEF COMPLAINT: Follow up right breast cancer   SUMMARY OF ONCOLOGIC HISTORY: Oncology History Overview Note   Cancer Staging  Malignant neoplasm of upper-outer quadrant of right breast in female, estrogen receptor positive ([place]) Staging form: Breast, AJCC 8th Edition - Clinical stage from 01/08/2021: Stage IA (cT1b, cN0, cM0, G2, ER+, PR+, HER2+) - Signed by Truitt Merle, MD on 01/15/2021 - Pathologic stage from 01/23/2021: Stage IA (pT1c, pN0, cM0, G2, ER+, PR+, HER2+) - Signed by Truitt Merle, MD on 02/13/2021     Malignant neoplasm of upper-outer quadrant of right breast in female, estrogen receptor positive ([place])  01/02/2021 Mammogram   EXAM: DIGITAL DIAGNOSTIC BILATERAL MAMMOGRAM WITH TOMOSYNTHESIS AND CAD; ULTRASOUND LEFT BREAST LIMITED; ULTRASOUND RIGHT BREAST LIMITED  IMPRESSION: 1. Highly suspicious 0.7 cm UPPER-OUTER RIGHT breast mass with associated suspicious calcifications extending 1.5 cm anteriorly. Tissue sampling is recommended. 2. No abnormal appearing RIGHT axillary lymph nodes. 3. Benign LOWER LEFT breast cyst corresponding to the LEFT breast screening study finding.   01/08/2021 Cancer Staging   Staging form: Breast, AJCC 8th Edition - Clinical stage from 01/08/2021: Stage IA (cT1b, cN0, cM0, G2, ER+, PR+, HER2+) - Signed by Truitt Merle, MD on 01/15/2021 Stage prefix: Initial diagnosis Nuclear grade: G2 Histologic grading system: 3 grade system    01/08/2021 Pathology Results   Diagnosis Breast, right, needle  core biopsy, upper outer quadrant, 11 o'clock, 5cmfn, ribbon clip - INVASIVE DUCTAL CARCINOMA - DUCTAL CARCINOMA IN SITU WITH CALCIFICATIONS - SEE COMMENT Microscopic Comment Based on the biopsy, the carcinoma appears Nottingham grade 2 of 3 and measures 0.6 cm in greatest linear extent.  PROGNOSTIC INDICATORS Results: The tumor cells are 2+ for Her2 (EQUIVOCAL). Her2 by FISH will be performed and results reported separately. Estrogen Receptor: 100%, POSITIVE, STRONG STAINING INTENSITY Progesterone Receptor: 70%, POSITIVE, STRONG STAINING INTENSITY Proliferation Marker Ki67: 10%  FLUORESCENCE IN-SITU HYBRIDIZATION Results: GROUP 1: HER2 **POSITIVE**   01/13/2021 Initial Diagnosis   Malignant neoplasm of upper-outer quadrant of right breast in female, estrogen receptor positive ([place])   01/15/2021 Genetic Testing   [facility] CustomNext Panel was Negative. Report date is 01/26/2021.  The CustomNext-Cancer+RNAinsight panel offered by [facility] includes sequencing and rearrangement analysis for the following 47 genes:  APC, ATM, AXIN2, BARD1, BMPR1A, BRCA1, BRCA2, BRIP1, CDH1, CDK4, CDKN2A, CHEK2, CTNNA1, DICER1, EPCAM, GREM1, HOXB13, KIT, MEN1, MLH1, MSH2, MSH3, MSH6, MUTYH, NBN, NF1, NTHL1, PALB2, PDGFRA, PMS2, POLD1, POLE, PTEN, RAD50, RAD51C, RAD51D, SDHA, SDHB, SDHC, SDHD, SMAD4, SMARCA4, STK11, TP53, TSC1, TSC2, and VHL.  RNA data is routinely analyzed for use in variant interpretation for all genes.   01/23/2021 Cancer Staging   Staging form: Breast, AJCC 8th Edition - Pathologic stage from 01/23/2021: Stage IA (pT1c, pN0, cM0, G2, ER+, PR+, HER2+) - Signed by Truitt Merle, MD on 02/13/2021 Histologic grading system: 3 grade system Residual tumor (R): R0 - None    01/23/2021 Definitive Surgery   FINAL MICROSCOPIC DIAGNOSIS:   A. BREAST, LEFT, MASTECTOMY:  - Fibrocystic changes with sclerosing adenosis and calcifications.  - Fibroadenoma (0.6 cm).  - No evidence of  malignancy.    B. BREAST, RIGHT, MASTECTOMY:  - Invasive and in situ ductal carcinoma, 1.7 cm.  - Margins negative for carcinoma.  - Biopsy site and biopsy clip.  - See oncology table.   C. LYMPH NODE, RIGHT AXILLARY, SENTINEL, EXCISION:  - One lymph node negative for metastatic carcinoma (0/1).   D. LYMPH NODE, RIGHT AXILLARY, SENTINEL, EXCISION:  - One lymph node negative for metastatic carcinoma (0/1).   E. LYMPH NODE, RIGHT AXILLARY, SENTINEL, EXCISION:  - One lymph node negative for metastatic carcinoma (0/1).   F. LYMPH NODE, RIGHT AXILLARY, SENTINEL, EXCISION:  - One lymph node negative for metastatic carcinoma (0/1).    02/25/2021 -  Chemotherapy   Patient is on Treatment Plan : BREAST Paclitaxel + Trastuzumab q7d / Trastuzumab q21d       CURRENT THERAPY: Adjuvant Abraxane/Herceptin weekly x12, starting 02/25/2021, followed by maintenance Herceptin every 3 weeks to total a year  INTERVAL HISTORY: Ms. Slatten returns for follow up and treatment as scheduled. Last seen by me 04/01/21. She has completed 7 cycles of weekly abraxane/herceptin.  She continues to tolerate well.  She has persistent metallic taste that makes her less interested in food, but still eating and drinking.  She has similar fatigue and nausea on day 3, she vomited once due to GERD usually relieved with omeprazole.  Bowels moving well.  Denies neuropathy, fever, chills, cough, chest pain, dyspnea.  She has questions about antiestrogen therapy in the future.  She plans to return to work March 13.    MEDICAL HISTORY:  Past Medical History:  Diagnosis Date   Allergy 11/1998   Penicillin   Chicken pox    History of transfusion of whole blood    Hyperlipidemia    Malignant neoplasm of upper-outer quadrant of right breast in female, estrogen receptor positive ([place]) 01/13/2021   Migraine    Thyroid disease    Hypothyroidism    SURGICAL HISTORY: Past Surgical History:  Procedure Laterality Date   ABDOMINOPLASTY   10/01/2010   CESAREAN SECTION     twice   HERNIA REPAIR     PORTACATH PLACEMENT Right 01/23/2021   Procedure: Right internal jugular 8 [place] PowerPort with C arm and ultrasound guidance;  Surgeon: Erroll Luna, MD;  Location: [facility];  Service: General;  Laterality: Right;   SENTINEL NODE BIOPSY Right 01/23/2021   Procedure: Right axillary sentinel lymph node mapping using Mag Trace;  Surgeon: Erroll Luna, MD;  Location: [facility];  Service: General;  Laterality: Right;   SIMPLE MASTECTOMY WITH AXILLARY SENTINEL NODE BIOPSY Bilateral 01/23/2021   Procedure: Right simple mastectomy ;  Left risk reducing simple mastectomy;  Surgeon: Erroll Luna, MD;  Location: [place];  Service: General;  Laterality: Bilateral;   TUBAL LIGATION     umblica hernia repair  70/48/8891    I have reviewed the social history and family history with the patient and they are unchanged from previous note.  ALLERGIES:  is allergic to penicillins.  MEDICATIONS:  Current Outpatient Medications  Medication Sig Dispense Refill   amLODipine (NORVASC) 5 MG tablet Take 1 tablet (5 mg total) by mouth daily. 90 tablet 3   b complex vitamins capsule Take 1 capsule by mouth daily.     Calcium Citrate-Vitamin D (CALCIUM + D PO) Take 1 tablet by mouth daily.     Cholecalciferol (VITAMIN D PO) Take 1 tablet by mouth daily.     cyclobenzaprine (FLEXERIL) 5 MG tablet Take 1 tablet (5 mg total)  by mouth 3 (three) times daily as needed for muscle spasms. 10 tablet 0   ibuprofen (ADVIL) 800 MG tablet Take 1 tablet (800 mg total) by mouth every 8 (eight) hours as needed. 30 tablet 0   Ibuprofen-diphenhydrAMINE HCl (ADVIL PM) 200-25 MG CAPS Take 2 tablets by mouth daily as needed (pain).     levothyroxine (SYNTHROID) 25 MCG tablet TAKE 1 TABLET(25 MCG) BY MOUTH DAILY BEFORE BREAKFAST 90 tablet 3   lidocaine-prilocaine (EMLA) cream Apply 1 application topically as needed (To be applied to portacath 30 to 60 mins prior to portacath  access). 30 g 2   Melatonin 10 MG CAPS Take 10 mg by mouth at bedtime.     metoprolol succinate (TOPROL-XL) 50 MG 24 hr tablet Take 1 tablet (50 mg total) by mouth daily. Take with or immediately following a meal. 90 tablet 3   Multiple Vitamins-Minerals (WOMENS MULTIVITAMIN PO) Take 1 tablet by mouth daily.     omeprazole (PRILOSEC OTC) 20 MG tablet Take 20 mg by mouth daily.     ondansetron (ZOFRAN) 8 MG tablet Take 1 tablet (8 mg total) by mouth 2 (two) times daily as needed (Nausea or vomiting). 30 tablet 1   oxyCODONE (OXY IR/ROXICODONE) 5 MG immediate release tablet Take 1 tablet (5 mg total) by mouth every 6 (six) hours as needed for severe pain. 15 tablet 0   prochlorperazine (COMPAZINE) 10 MG tablet Take 1 tablet (10 mg total) by mouth every 6 (six) hours as needed (Nausea or vomiting). 30 tablet 1   simvastatin (ZOCOR) 20 MG tablet TAKE 1 TABLET(20 MG) BY MOUTH AT BEDTIME 90 tablet 3   No current facility-administered medications for this visit.   Facility-Administered Medications Ordered in Other Visits  Medication Dose Route Frequency Provider Last Rate Last Admin   heparin lock flush 100 unit/mL  500 Units Intracatheter Once PRN Truitt Merle, MD       sodium chloride flush (NS) 0.9 % injection 10 mL  10 mL Intracatheter PRN Truitt Merle, MD        PHYSICAL EXAMINATION: ECOG PERFORMANCE STATUS: 1 - Symptomatic but completely ambulatory  Vitals:   04/15/21 0933  BP: 136/89  Pulse: 88  Resp: 16  Temp: 98.1 F (36.7 C)  SpO2: 100%   Filed Weights   04/15/21 0933  Weight: 125 lb 4.8 oz (56.8 kg)    GENERAL:alert, no distress and comfortable SKIN: No rash EYES: sclera clear LUNGS: clear with normal breathing effort HEART: regular rate & rhythm, no lower extremity edema NEURO: alert & oriented x 3 with fluent speech, no focal motor deficits.  Minimally decreased vibratory sense over the fingertips per tuning fork exam PAC without erythema Breast exam deferred  LABORATORY  DATA:  I have reviewed the data as listed CBC Latest Ref Rng & Units 04/15/2021 04/08/2021 04/01/2021  WBC 4.0 - 10.5 K/uL 3.6(L) 4.5 3.9(L)  Hemoglobin 12.0 - 15.0 g/dL 9.9(L) 10.9(L) 10.6(L)  Hematocrit 36.0 - 46.0 % 29.0(L) 31.8(L) 30.7(L)  Platelets 150 - 400 K/uL 232 241 232     CMP Latest Ref Rng & Units 04/15/2021 04/08/2021 04/01/2021  Glucose 70 - 99 mg/dL 105(H) 101(H) 125(H)  BUN 6 - 20 mg/dL 15 14 11   Creatinine 0.44 - 1.00 mg/dL 0.68 0.73 0.67  Sodium 135 - 145 mmol/L 139 139 141  Potassium 3.5 - 5.1 mmol/L 3.9 4.1 4.1  Chloride 98 - 111 mmol/L 108 106 109  CO2 22 - 32 mmol/L 24 26 25  Calcium 8.9 - 10.3 mg/dL 9.1 9.5 9.3  Total Protein 6.5 - 8.1 g/dL 6.5 7.0 6.9  Total Bilirubin 0.3 - 1.2 mg/dL 0.3 0.4 0.4  Alkaline Phos 38 - 126 U/L 47 48 52  AST 15 - 41 U/L 19 20 25   ALT 0 - 44 U/L 11 12 17       RADIOGRAPHIC STUDIES: I have personally reviewed the radiological images as listed and agreed with the findings in the report. No results found.   ASSESSMENT & PLAN: Magally Vahle is a 56 y.o. female with    1. Malignant neoplasm of upper-outer quadrant of right breast, Stage IA, c(T1bN0M0), Triple positive, Grade 2 -screening detected 0.7 cm upper-outer right breast mass with associated calcifications extending 1.5 cm. No abnormal right lymph nodes or left malignancy. 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 -S/p b/l mastectomies on 01/23/21 with Dr. Brantley Stage. 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  Port was placed during surgery -Due to Her2+ disease adjuvant chemotherapy was recommended, she began weekly Abraxane/Herceptin x12 weeks on 02/25/21, to be followed by maintenance Herceptin every 3 weeks to total a year.  -Treatment plan includes adjuvant antiestrogen therapy to follow.  She would not need postmastectomy radiation -Baseline EF 60-65% with impaired relaxation (grade I diastolic dysfunction). Will monitor q3 months  on herceptin   2.  Elevated blood pressure and anxiety -She has been very anxious since her diagnosis, BP elevated in clinic last few visits -no previous HTN diagnosis or treatment.  She is on metoprolol for tachycardia. -She will check BP at home and let us know if it remains elevated -BP normal today  Disposition: Ms. Tonche appears stable.  S/p 7 cycles of weekly Abraxane/Herceptin, tolerating well with mild taste change, fatigue and nausea on day 3.  Sensory exam slightly decreased per tuning fork but no overt symptoms.  Side effects are well managed with supportive care at home.  She is able to recover function well.  Labs reviewed, stable, ANC 1.4.  Proceed with cycle 8 weekly Abraxane/Herceptin today as planned at same dose.  Hold filgrastim.   She will return for lab and cycle 9 next week, then follow-up in 2 weeks for cycle 10.  We discussed antiestrogen in her perimenopausal status would include either tamoxifen OR ovarian suppression (Zoladex versus BSO) and AI. I reviewed potential side effects of tamoxifen and AI including the increased risk of endometrial cancer with tamoxifen. She is not interested in monthly zoladex and would like to discuss BSO with ob/gyn. I will refer her. We have not finalized anti-estrogen therapy at this point but she would like the referral to gather more information.    Orders Placed This Encounter  Procedures   Ambulatory referral to Gynecology    Referral Priority:   Routine    Referral Type:   Consultation    Referral Reason:   Specialty Services Required    Requested Specialty:   Gynecology    Number of Visits Requested:   1   All questions were answered. The patient knows to call the clinic with any problems, questions or concerns. No barriers to learning was detected. I spent 20 minutes counseling the patient face to face. The total time spent in the appointment was 30 minutes and more than 50% was on counseling and review of test results      Alla Feeling, NP 04/15/21

## 2021-04-15 ENCOUNTER — Inpatient Hospital Stay: Payer: Managed Care, Other (non HMO) | Admitting: Nurse Practitioner

## 2021-04-15 ENCOUNTER — Inpatient Hospital Stay: Payer: Managed Care, Other (non HMO) | Attending: Hematology

## 2021-04-15 ENCOUNTER — Inpatient Hospital Stay: Payer: Managed Care, Other (non HMO)

## 2021-04-15 ENCOUNTER — Other Ambulatory Visit: Payer: Self-pay

## 2021-04-15 ENCOUNTER — Encounter: Payer: Self-pay | Admitting: Nurse Practitioner

## 2021-04-15 VITALS — BP 136/89 | HR 88 | Temp 98.1°F | Resp 16 | Ht 60.0 in | Wt 125.3 lb

## 2021-04-15 DIAGNOSIS — Z170 Estrogen receptor positive status [ER+]: Secondary | ICD-10-CM

## 2021-04-15 DIAGNOSIS — Z5112 Encounter for antineoplastic immunotherapy: Secondary | ICD-10-CM | POA: Insufficient documentation

## 2021-04-15 DIAGNOSIS — Z79899 Other long term (current) drug therapy: Secondary | ICD-10-CM | POA: Insufficient documentation

## 2021-04-15 DIAGNOSIS — C50411 Malignant neoplasm of upper-outer quadrant of right female breast: Secondary | ICD-10-CM | POA: Diagnosis not present

## 2021-04-15 DIAGNOSIS — Z5111 Encounter for antineoplastic chemotherapy: Secondary | ICD-10-CM | POA: Diagnosis present

## 2021-04-15 DIAGNOSIS — Z95828 Presence of other vascular implants and grafts: Secondary | ICD-10-CM

## 2021-04-15 LAB — CMP (CANCER CENTER ONLY)
ALT: 11 U/L (ref 0–44)
AST: 19 U/L (ref 15–41)
Albumin: 4.1 g/dL (ref 3.5–5.0)
Alkaline Phosphatase: 47 U/L (ref 38–126)
Anion gap: 7 (ref 5–15)
BUN: 15 mg/dL (ref 6–20)
CO2: 24 mmol/L (ref 22–32)
Calcium: 9.1 mg/dL (ref 8.9–10.3)
Chloride: 108 mmol/L (ref 98–111)
Creatinine: 0.68 mg/dL (ref 0.44–1.00)
GFR, Estimated: >60 mL/min (ref >60)
Glucose, Bld: 105 mg/dL — ABNORMAL HIGH (ref 70–99)
Potassium: 3.9 mmol/L (ref 3.5–5.1)
Sodium: 139 mmol/L (ref 135–145)
Total Bilirubin: 0.3 mg/dL (ref 0.3–1.2)
Total Protein: 6.5 g/dL (ref 6.5–8.1)

## 2021-04-15 LAB — CBC WITH DIFFERENTIAL (CANCER CENTER ONLY)
Abs Immature Granulocytes: 0.02 10*3/uL (ref 0.00–0.07)
Basophils Absolute: 0 10*3/uL (ref 0.0–0.1)
Basophils Relative: 1 %
Eosinophils Absolute: 0.1 10*3/uL (ref 0.0–0.5)
Eosinophils Relative: 2 %
HCT: 29 % — ABNORMAL LOW (ref 36.0–46.0)
Hemoglobin: 9.9 g/dL — ABNORMAL LOW (ref 12.0–15.0)
Immature Granulocytes: 1 %
Lymphocytes Relative: 50 %
Lymphs Abs: 1.8 10*3/uL (ref 0.7–4.0)
MCH: 28.5 pg (ref 26.0–34.0)
MCHC: 34.1 g/dL (ref 30.0–36.0)
MCV: 83.6 fL (ref 80.0–100.0)
Monocytes Absolute: 0.2 10*3/uL (ref 0.1–1.0)
Monocytes Relative: 6 %
Neutro Abs: 1.4 10*3/uL — ABNORMAL LOW (ref 1.7–7.7)
Neutrophils Relative %: 40 %
Platelet Count: 232 10*3/uL (ref 150–400)
RBC: 3.47 MIL/uL — ABNORMAL LOW (ref 3.87–5.11)
RDW: 12.9 % (ref 11.5–15.5)
WBC Count: 3.6 10*3/uL — ABNORMAL LOW (ref 4.0–10.5)
nRBC: 0 % (ref 0.0–0.2)

## 2021-04-15 MED ORDER — ACETAMINOPHEN 325 MG PO TABS
650.0000 mg | ORAL_TABLET | Freq: Once | ORAL | Status: AC
  Administered 2021-04-15: 650 mg via ORAL
  Filled 2021-04-15: qty 2

## 2021-04-15 MED ORDER — PACLITAXEL PROTEIN-BOUND CHEMO INJECTION 100 MG
100.0000 mg/m2 | Freq: Once | INTRAVENOUS | Status: AC
  Administered 2021-04-15: 150 mg via INTRAVENOUS
  Filled 2021-04-15: qty 30

## 2021-04-15 MED ORDER — SODIUM CHLORIDE 0.9% FLUSH
10.0000 mL | Freq: Once | INTRAVENOUS | Status: AC
  Administered 2021-04-15: 10 mL

## 2021-04-15 MED ORDER — SODIUM CHLORIDE 0.9 % IV SOLN: Freq: Once | INTRAVENOUS | Status: AC

## 2021-04-15 MED ORDER — SODIUM CHLORIDE 0.9% FLUSH
10.0000 mL | INTRAVENOUS | Status: DC | PRN
  Administered 2021-04-15: 10 mL

## 2021-04-15 MED ORDER — TRASTUZUMAB-ANNS CHEMO 150 MG IV SOLR
2.0000 mg/kg | Freq: Once | INTRAVENOUS | Status: AC
  Administered 2021-04-15: 105 mg via INTRAVENOUS
  Filled 2021-04-15: qty 5

## 2021-04-15 MED ORDER — HEPARIN SOD (PORK) LOCK FLUSH 100 UNIT/ML IV SOLN
500.0000 [IU] | Freq: Once | INTRAVENOUS | Status: AC | PRN
  Administered 2021-04-15: 500 [IU]

## 2021-04-15 MED ORDER — DIPHENHYDRAMINE HCL 50 MG/ML IJ SOLN
25.0000 mg | Freq: Once | INTRAMUSCULAR | Status: AC
  Administered 2021-04-15: 25 mg via INTRAVENOUS
  Filled 2021-04-15: qty 1

## 2021-04-15 MED ORDER — PROCHLORPERAZINE MALEATE 10 MG PO TABS: 10.0000 mg | ORAL_TABLET | Freq: Four times a day (QID) | ORAL | 1 refills | Status: DC | PRN

## 2021-04-15 NOTE — Progress Notes (Signed)
Ok to treat with ANC of 1.4 K/uL per Cira Rue, NP.

## 2021-04-22 ENCOUNTER — Inpatient Hospital Stay: Payer: Managed Care, Other (non HMO)

## 2021-04-22 ENCOUNTER — Other Ambulatory Visit: Payer: Self-pay

## 2021-04-22 VITALS — BP 134/87 | HR 86 | Temp 98.7°F | Resp 16 | Wt 126.5 lb

## 2021-04-22 DIAGNOSIS — Z170 Estrogen receptor positive status [ER+]: Secondary | ICD-10-CM

## 2021-04-22 DIAGNOSIS — Z5112 Encounter for antineoplastic immunotherapy: Secondary | ICD-10-CM | POA: Diagnosis not present

## 2021-04-22 DIAGNOSIS — C50411 Malignant neoplasm of upper-outer quadrant of right female breast: Secondary | ICD-10-CM

## 2021-04-22 DIAGNOSIS — Z95828 Presence of other vascular implants and grafts: Secondary | ICD-10-CM

## 2021-04-22 LAB — CBC WITH DIFFERENTIAL (CANCER CENTER ONLY)
Abs Immature Granulocytes: 0.04 10*3/uL (ref 0.00–0.07)
Basophils Absolute: 0 10*3/uL (ref 0.0–0.1)
Basophils Relative: 1 %
Eosinophils Absolute: 0.1 10*3/uL (ref 0.0–0.5)
Eosinophils Relative: 2 %
HCT: 32.3 % — ABNORMAL LOW (ref 36.0–46.0)
Hemoglobin: 11 g/dL — ABNORMAL LOW (ref 12.0–15.0)
Immature Granulocytes: 1 %
Lymphocytes Relative: 51 %
Lymphs Abs: 2.3 10*3/uL (ref 0.7–4.0)
MCH: 29 pg (ref 26.0–34.0)
MCHC: 34.1 g/dL (ref 30.0–36.0)
MCV: 85.2 fL (ref 80.0–100.0)
Monocytes Absolute: 0.3 10*3/uL (ref 0.1–1.0)
Monocytes Relative: 6 %
Neutro Abs: 1.7 10*3/uL (ref 1.7–7.7)
Neutrophils Relative %: 39 %
Platelet Count: 290 10*3/uL (ref 150–400)
RBC: 3.79 MIL/uL — ABNORMAL LOW (ref 3.87–5.11)
RDW: 13.1 % (ref 11.5–15.5)
WBC Count: 4.4 10*3/uL (ref 4.0–10.5)
nRBC: 0.5 % — ABNORMAL HIGH (ref 0.0–0.2)

## 2021-04-22 LAB — CMP (CANCER CENTER ONLY)
ALT: 11 U/L (ref 0–44)
AST: 21 U/L (ref 15–41)
Albumin: 4.4 g/dL (ref 3.5–5.0)
Alkaline Phosphatase: 51 U/L (ref 38–126)
Anion gap: 6 (ref 5–15)
BUN: 14 mg/dL (ref 6–20)
CO2: 27 mmol/L (ref 22–32)
Calcium: 9.2 mg/dL (ref 8.9–10.3)
Chloride: 105 mmol/L (ref 98–111)
Creatinine: 0.8 mg/dL (ref 0.44–1.00)
GFR, Estimated: >60 mL/min (ref >60)
Glucose, Bld: 121 mg/dL — ABNORMAL HIGH (ref 70–99)
Potassium: 4.5 mmol/L (ref 3.5–5.1)
Sodium: 138 mmol/L (ref 135–145)
Total Bilirubin: 0.4 mg/dL (ref 0.3–1.2)
Total Protein: 6.9 g/dL (ref 6.5–8.1)

## 2021-04-22 MED ORDER — SODIUM CHLORIDE 0.9% FLUSH
10.0000 mL | Freq: Once | INTRAVENOUS | Status: AC
  Administered 2021-04-22: 10 mL

## 2021-04-22 MED ORDER — PACLITAXEL PROTEIN-BOUND CHEMO INJECTION 100 MG
100.0000 mg/m2 | Freq: Once | INTRAVENOUS | Status: AC
  Administered 2021-04-22: 150 mg via INTRAVENOUS
  Filled 2021-04-22: qty 30

## 2021-04-22 MED ORDER — ACETAMINOPHEN 325 MG PO TABS
650.0000 mg | ORAL_TABLET | Freq: Once | ORAL | Status: AC
  Administered 2021-04-22: 650 mg via ORAL
  Filled 2021-04-22: qty 2

## 2021-04-22 MED ORDER — SODIUM CHLORIDE 0.9 % IV SOLN: Freq: Once | INTRAVENOUS | Status: AC

## 2021-04-22 MED ORDER — DIPHENHYDRAMINE HCL 50 MG/ML IJ SOLN
25.0000 mg | Freq: Once | INTRAMUSCULAR | Status: AC
  Administered 2021-04-22: 25 mg via INTRAVENOUS
  Filled 2021-04-22: qty 1

## 2021-04-22 MED ORDER — TRASTUZUMAB-ANNS CHEMO 150 MG IV SOLR
2.0000 mg/kg | Freq: Once | INTRAVENOUS | Status: AC
  Administered 2021-04-22: 105 mg via INTRAVENOUS
  Filled 2021-04-22: qty 5

## 2021-04-22 MED ORDER — HEPARIN SOD (PORK) LOCK FLUSH 100 UNIT/ML IV SOLN
500.0000 [IU] | Freq: Once | INTRAVENOUS | Status: AC | PRN
  Administered 2021-04-22: 500 [IU]

## 2021-04-22 MED ORDER — SODIUM CHLORIDE 0.9% FLUSH
10.0000 mL | INTRAVENOUS | Status: DC | PRN
  Administered 2021-04-22: 10 mL

## 2021-04-22 NOTE — Patient Instructions (Signed)
[facility]  Discharge Instructions: Thank you for choosing [facility] to provide your oncology and hematology care.   If you have a lab appointment with the [facility], please go directly to the [facility] and check in at the registration area.   Wear comfortable clothing and clothing appropriate for easy access to any Portacath or PICC line.   We strive to give you quality time with your provider. You may need to reschedule your appointment if you arrive late (15 or more minutes).  Arriving late affects you and other patients whose appointments are after yours.  Also, if you miss three or more appointments without notifying the office, you may be dismissed from the clinic at the providers discretion.      For prescription refill requests, have your pharmacy contact our office and allow 72 hours for refills to be completed.    Today you received the following chemotherapy and/or immunotherapy agents: Trastuzumab and Abraxane      To help prevent nausea and vomiting after your treatment, we encourage you to take your nausea medication as directed.  BELOW ARE SYMPTOMS THAT SHOULD BE REPORTED IMMEDIATELY: *FEVER GREATER THAN 100.4 F (38 C) OR HIGHER *CHILLS OR SWEATING *NAUSEA AND VOMITING THAT IS NOT CONTROLLED WITH YOUR NAUSEA MEDICATION *UNUSUAL SHORTNESS OF BREATH *UNUSUAL BRUISING OR BLEEDING *URINARY PROBLEMS (pain or burning when urinating, or frequent urination) *BOWEL PROBLEMS (unusual diarrhea, constipation, pain near the anus) TENDERNESS IN MOUTH AND THROAT WITH OR WITHOUT PRESENCE OF ULCERS (sore throat, sores in mouth, or a toothache) UNUSUAL RASH, SWELLING OR PAIN  UNUSUAL VAGINAL DISCHARGE OR ITCHING   Items with * indicate a potential emergency and should be followed up as soon as possible or go to the Emergency Department if any problems should occur.  Please show the CHEMOTHERAPY ALERT CARD or IMMUNOTHERAPY ALERT CARD  at check-in to the Emergency Department and triage nurse.  Should you have questions after your visit or need to cancel or reschedule your appointment, please contact [facility]  Dept: [phone]  and follow the prompts.  Office hours are 8:00 a.m. to 4:30 p.m. Monday - Friday. Please note that voicemails left after 4:00 p.m. may not be returned until the following business day.  We are closed weekends and major holidays. You have access to a nurse at all times for urgent questions. Please call the main number to the clinic Dept: [phone] and follow the prompts.   For any non-urgent questions, you may also contact your provider using MyChart. We now offer e-Visits for anyone 49 and older to request care online for non-urgent symptoms. For details visit [email or web address].   Also download the MyChart app! Go to the app store, search "MyChart", open the app, select [facility], and log in with your MyChart username and password.  Due to Covid, a mask is required upon entering the hospital/clinic. If you do not have a mask, one will be given to you upon arrival. For doctor visits, patients may have 1 support person aged 7 or older with them. For treatment visits, patients cannot have anyone with them due to current Covid guidelines and our immunocompromised population.

## 2021-04-23 ENCOUNTER — Ambulatory Visit (HOSPITAL_COMMUNITY)
Admission: RE | Admit: 2021-04-23 | Discharge: 2021-04-23 | Disposition: A | Discharge status: Home / Self Care | Payer: Managed Care, Other (non HMO) | Source: Ambulatory Visit | Attending: Hematology | Admitting: Hematology

## 2021-04-23 DIAGNOSIS — C50411 Malignant neoplasm of upper-outer quadrant of right female breast: Secondary | ICD-10-CM | POA: Diagnosis present

## 2021-04-23 DIAGNOSIS — I517 Cardiomegaly: Secondary | ICD-10-CM | POA: Diagnosis not present

## 2021-04-23 DIAGNOSIS — Z0189 Encounter for other specified special examinations: Secondary | ICD-10-CM

## 2021-04-23 DIAGNOSIS — I358 Other nonrheumatic aortic valve disorders: Secondary | ICD-10-CM | POA: Insufficient documentation

## 2021-04-23 DIAGNOSIS — I700 Atherosclerosis of aorta: Secondary | ICD-10-CM | POA: Diagnosis not present

## 2021-04-23 DIAGNOSIS — Z170 Estrogen receptor positive status [ER+]: Secondary | ICD-10-CM | POA: Diagnosis not present

## 2021-04-23 DIAGNOSIS — Z01818 Encounter for other preprocedural examination: Secondary | ICD-10-CM | POA: Diagnosis not present

## 2021-04-23 LAB — ECHOCARDIOGRAM COMPLETE
AR max vel: 2.63 cm2
AV Peak grad: 6.2 mmHg
Ao pk vel: 1.24 m/s
Area-P 1/2: 4.49 cm2
Calc EF: 61.4 %
S' Lateral: 2.6 cm
Single Plane A2C EF: 59.6 %
Single Plane A4C EF: 63.7 %

## 2021-04-24 ENCOUNTER — Encounter: Payer: Self-pay | Admitting: Hematology

## 2021-04-24 ENCOUNTER — Other Ambulatory Visit: Payer: Self-pay | Admitting: Nurse Practitioner

## 2021-04-24 DIAGNOSIS — Z170 Estrogen receptor positive status [ER+]: Secondary | ICD-10-CM

## 2021-04-24 DIAGNOSIS — C50411 Malignant neoplasm of upper-outer quadrant of right female breast: Secondary | ICD-10-CM

## 2021-04-27 NOTE — Progress Notes (Signed)
[name]   [phone] [phone]   Clinic Follow up Note   Patient Care Team: Copland, Gay Filler, MD as PCP - General (Family Medicine) Erroll Luna, MD as Consulting Physician (General Surgery) Truitt Merle, MD as Consulting Physician (Hematology) Eppie Gibson, MD as Attending Physician (Radiation Oncology) Mauro Kaufmann, RN as Oncology Nurse Navigator [name], RN as Oncology Nurse Navigator 04/29/2021  CHIEF COMPLAINT: Follow up right breast cancer   SUMMARY OF ONCOLOGIC HISTORY: Oncology History Overview Note   Cancer Staging  Malignant neoplasm of upper-outer quadrant of right breast in female, estrogen receptor positive ([place]) Staging form: Breast, AJCC 8th Edition - Clinical stage from 01/08/2021: Stage IA (cT1b, cN0, cM0, G2, ER+, PR+, HER2+) - Signed by Truitt Merle, MD on 01/15/2021 - Pathologic stage from 01/23/2021: Stage IA (pT1c, pN0, cM0, G2, ER+, PR+, HER2+) - Signed by Truitt Merle, MD on 02/13/2021     Malignant neoplasm of upper-outer quadrant of right breast in female, estrogen receptor positive ([facility])  01/02/2021 Mammogram   EXAM: DIGITAL DIAGNOSTIC BILATERAL MAMMOGRAM WITH TOMOSYNTHESIS AND CAD; ULTRASOUND LEFT BREAST LIMITED; ULTRASOUND RIGHT BREAST LIMITED  IMPRESSION: 1. Highly suspicious 0.7 cm UPPER-OUTER RIGHT breast mass with associated suspicious calcifications extending 1.5 cm anteriorly. Tissue sampling is recommended. 2. No abnormal appearing RIGHT axillary lymph nodes. 3. Benign LOWER LEFT breast cyst corresponding to the LEFT breast screening study finding.   01/08/2021 Cancer Staging   Staging form: Breast, AJCC 8th Edition - Clinical stage from 01/08/2021: Stage IA (cT1b, cN0, cM0, G2, ER+, PR+, HER2+) - Signed by Truitt Merle, MD on 01/15/2021 Stage prefix: Initial diagnosis Nuclear grade: G2 Histologic grading system: 3 grade system    01/08/2021 Pathology Results   Diagnosis Breast, right, needle  core biopsy, upper outer quadrant, 11 o'clock, 5cmfn, ribbon clip - INVASIVE DUCTAL CARCINOMA - DUCTAL CARCINOMA IN SITU WITH CALCIFICATIONS - SEE COMMENT Microscopic Comment Based on the biopsy, the carcinoma appears Nottingham grade 2 of 3 and measures 0.6 cm in greatest linear extent.  PROGNOSTIC INDICATORS Results: The tumor cells are 2+ for Her2 (EQUIVOCAL). Her2 by FISH will be performed and results reported separately. Estrogen Receptor: 100%, POSITIVE, STRONG STAINING INTENSITY Progesterone Receptor: 70%, POSITIVE, STRONG STAINING INTENSITY Proliferation Marker Ki67: 10%  FLUORESCENCE IN-SITU HYBRIDIZATION Results: GROUP 1: HER2 **POSITIVE**   01/13/2021 Initial Diagnosis   Malignant neoplasm of upper-outer quadrant of right breast in female, estrogen receptor positive ([facility])   01/15/2021 Genetic Testing   [facility] CustomNext Panel was Negative. Report date is 01/26/2021.  The CustomNext-Cancer+RNAinsight panel offered by [facility] includes sequencing and rearrangement analysis for the following 47 genes:  APC, ATM, AXIN2, BARD1, BMPR1A, BRCA1, BRCA2, BRIP1, CDH1, CDK4, CDKN2A, CHEK2, CTNNA1, DICER1, EPCAM, GREM1, HOXB13, KIT, MEN1, MLH1, MSH2, MSH3, MSH6, MUTYH, NBN, NF1, NTHL1, PALB2, PDGFRA, PMS2, POLD1, POLE, PTEN, RAD50, RAD51C, RAD51D, SDHA, SDHB, SDHC, SDHD, SMAD4, SMARCA4, STK11, TP53, TSC1, TSC2, and VHL.  RNA data is routinely analyzed for use in variant interpretation for all genes.   01/23/2021 Cancer Staging   Staging form: Breast, AJCC 8th Edition - Pathologic stage from 01/23/2021: Stage IA (pT1c, pN0, cM0, G2, ER+, PR+, HER2+) - Signed by Truitt Merle, MD on 02/13/2021 Histologic grading system: 3 grade system Residual tumor (R): R0 - None    01/23/2021 Definitive Surgery   FINAL MICROSCOPIC DIAGNOSIS:   A. BREAST, LEFT, MASTECTOMY:  - Fibrocystic changes with sclerosing adenosis and calcifications.  - Fibroadenoma (0.6 cm).  - No evidence of  malignancy.    B. BREAST, RIGHT, MASTECTOMY:  - Invasive and in situ ductal carcinoma, 1.7 cm.  - Margins negative for carcinoma.  - Biopsy site and biopsy clip.  - See oncology table.   C. LYMPH NODE, RIGHT AXILLARY, SENTINEL, EXCISION:  - One lymph node negative for metastatic carcinoma (0/1).   D. LYMPH NODE, RIGHT AXILLARY, SENTINEL, EXCISION:  - One lymph node negative for metastatic carcinoma (0/1).   E. LYMPH NODE, RIGHT AXILLARY, SENTINEL, EXCISION:  - One lymph node negative for metastatic carcinoma (0/1).   F. LYMPH NODE, RIGHT AXILLARY, SENTINEL, EXCISION:  - One lymph node negative for metastatic carcinoma (0/1).    02/25/2021 -  Chemotherapy   Patient is on Treatment Plan : BREAST Paclitaxel + Trastuzumab q7d / Trastuzumab q21d       CURRENT THERAPY:  Adjuvant Abraxane/Herceptin weekly x12, starting 02/25/2021, followed by maintenance Herceptin every 3 weeks to total a year  INTERVAL HISTORY: Dana Kramer returns for follow up and treatment as scheduled. Last seen by me 04/15/21 and completed cycle 8 abraxane/herceptin. She is here today prior to cycle 10 of 12. R hand gets a little swollen but manages with compression sleeve/garment. She completed PT. She is more tired, feels the dose is maybe too strong. He can function but with more effort. She has also noticed "minute" gradual neuropathy in the left fingertips more than right, but none in toes. Not dropping things. She does cryotherapy with chemo and takes B complex vit daily. At night she has leg aches from the knee down, R>L. Denies injury or strain. She can still ambulate and do her normal activities. GERD has worsened, omeprazole not helping, and pepto bismol only helped temorarily. She can eat and drink. Bowels moving. Metallic taste persists, denies mucositis. Denies fever, chills, cough, chest pain, dyspnea, leg edema, rash, or other complaints.    MEDICAL HISTORY:  Past Medical History:  Diagnosis Date   Allergy 11/1998    Penicillin   Chicken pox    History of transfusion of whole blood    Hyperlipidemia    Malignant neoplasm of upper-outer quadrant of right breast in female, estrogen receptor positive ([facility]) 01/13/2021   Migraine    Thyroid disease    Hypothyroidism    SURGICAL HISTORY: Past Surgical History:  Procedure Laterality Date   ABDOMINOPLASTY  10/01/2010   CESAREAN SECTION     twice   HERNIA REPAIR     PORTACATH PLACEMENT Right 01/23/2021   Procedure: Right internal jugular 8 [place] PowerPort with C arm and ultrasound guidance;  Surgeon: Erroll Luna, MD;  Location: [facility];  Service: General;  Laterality: Right;   SENTINEL NODE BIOPSY Right 01/23/2021   Procedure: Right axillary sentinel lymph node mapping using Mag Trace;  Surgeon: Erroll Luna, MD;  Location: [facility];  Service: General;  Laterality: Right;   SIMPLE MASTECTOMY WITH AXILLARY SENTINEL NODE BIOPSY Bilateral 01/23/2021   Procedure: Right simple mastectomy ;  Left risk reducing simple mastectomy;  Surgeon: Erroll Luna, MD;  Location: [facility];  Service: General;  Laterality: Bilateral;   TUBAL LIGATION     umblica hernia repair  44/92/0100    I have reviewed the social history and family history with the patient and they are unchanged from previous note.  ALLERGIES:  is allergic to penicillins.  MEDICATIONS:  Current Outpatient Medications  Medication Sig Dispense Refill   pantoprazole (PROTONIX) 40 MG tablet Take 1 tablet (40 mg total) by mouth daily. 30 tablet 0  amLODipine (NORVASC) 5 MG tablet Take 1 tablet (5 mg total) by mouth daily. 90 tablet 3   b complex vitamins capsule Take 1 capsule by mouth daily.     Calcium Citrate-Vitamin D (CALCIUM + D PO) Take 1 tablet by mouth daily.     Cholecalciferol (VITAMIN D PO) Take 1 tablet by mouth daily.     cyclobenzaprine (FLEXERIL) 5 MG tablet Take 1 tablet (5 mg total) by mouth 3 (three) times daily as needed for muscle spasms. 10 tablet 0   ibuprofen (ADVIL) 800 MG  tablet Take 1 tablet (800 mg total) by mouth every 8 (eight) hours as needed. 30 tablet 0   Ibuprofen-diphenhydrAMINE HCl (ADVIL PM) 200-25 MG CAPS Take 2 tablets by mouth daily as needed (pain).     levothyroxine (SYNTHROID) 25 MCG tablet TAKE 1 TABLET(25 MCG) BY MOUTH DAILY BEFORE BREAKFAST 90 tablet 3   lidocaine-prilocaine (EMLA) cream Apply 1 application topically as needed (To be applied to portacath 30 to 60 mins prior to portacath access). 30 g 2   Melatonin 10 MG CAPS Take 10 mg by mouth at bedtime.     metoprolol succinate (TOPROL-XL) 50 MG 24 hr tablet Take 1 tablet (50 mg total) by mouth daily. Take with or immediately following a meal. 90 tablet 3   Multiple Vitamins-Minerals (WOMENS MULTIVITAMIN PO) Take 1 tablet by mouth daily.     ondansetron (ZOFRAN) 8 MG tablet Take 1 tablet (8 mg total) by mouth 2 (two) times daily as needed (Nausea or vomiting). 30 tablet 1   oxyCODONE (OXY IR/ROXICODONE) 5 MG immediate release tablet Take 1 tablet (5 mg total) by mouth every 6 (six) hours as needed for severe pain. 15 tablet 0   prochlorperazine (COMPAZINE) 10 MG tablet TAKE 1 TABLET(10 MG) BY MOUTH EVERY 6 HOURS AS NEEDED FOR NAUSEA OR VOMITING 30 tablet 1   simvastatin (ZOCOR) 20 MG tablet TAKE 1 TABLET(20 MG) BY MOUTH AT BEDTIME 90 tablet 3   No current facility-administered medications for this visit.    PHYSICAL EXAMINATION: ECOG PERFORMANCE STATUS: 1 - Symptomatic but completely ambulatory  Vitals:   04/29/21 0939  BP: 133/85  Pulse: 92  Resp: 17  Temp: (!) 96.8 F (36 C)  SpO2: 99%   Filed Weights   04/29/21 0939  Weight: 129 lb 3 oz (58.6 kg)    GENERAL:alert, no distress and comfortable SKIN: no rash  EYES: sclera clear NECK: without mass  LUNGS: clear with normal breathing effort HEART: regular rate & rhythm, no lower extremity edema ABDOMEN:abdomen soft, non-tender and normal bowel sounds NEURO: alert & oriented x 3 with fluent speech, no focal motor deficits.   Mildly decreased peripheral vibratory sense over the fingertips per tuning fork exam PAC without erythema Breast exam deferred  LABORATORY DATA:  I have reviewed the data as listed CBC Latest Ref Rng & Units 04/29/2021 04/22/2021 04/15/2021  WBC 4.0 - 10.5 K/uL 4.2 4.4 3.6(L)  Hemoglobin 12.0 - 15.0 g/dL 10.1(L) 11.0(L) 9.9(L)  Hematocrit 36.0 - 46.0 % 29.3(L) 32.3(L) 29.0(L)  Platelets 150 - 400 K/uL 243 290 232     CMP Latest Ref Rng & Units 04/29/2021 04/22/2021 04/15/2021  Glucose 70 - 99 mg/dL 136(H) 121(H) 105(H)  BUN 6 - 20 mg/dL 11 14 15   Creatinine 0.44 - 1.00 mg/dL 0.75 0.80 0.68  Sodium 135 - 145 mmol/L 140 138 139  Potassium 3.5 - 5.1 mmol/L 3.8 4.5 3.9  Chloride 98 - 111 mmol/L 108 105 108  CO2 22 - 32 mmol/L 25 27 24   Calcium 8.9 - 10.3 mg/dL 8.9 9.2 9.1  Total Protein 6.5 - 8.1 g/dL 6.6 6.9 6.5  Total Bilirubin 0.3 - 1.2 mg/dL 0.3 0.4 0.3  Alkaline Phos 38 - 126 U/L 49 51 47  AST 15 - 41 U/L 20 21 19   ALT 0 - 44 U/L 11 11 11       RADIOGRAPHIC STUDIES: I have personally reviewed the radiological images as listed and agreed with the findings in the report. No results found.   ASSESSMENT & PLAN: Dana Kramer is a 56 y.o. female with    1. Malignant neoplasm of upper-outer quadrant of right breast, Stage IA, c(T1bN0M0), Triple positive, Grade 2 -screening detected 0.7 cm upper-outer right breast mass with associated calcifications extending 1.5 cm. No abnormal right lymph nodes or left malignancy. 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 -S/p b/l mastectomies on 01/23/21 with Dr. Brantley Stage. 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  Port was placed during surgery -Due to Her2+ disease adjuvant chemotherapy was recommended, she began weekly Abraxane/Herceptin x12 weeks on 02/25/21, to be followed by maintenance Herceptin every 3 weeks to total a year.  -Treatment plan includes adjuvant antiestrogen therapy to follow.  She  would not need postmastectomy radiation -Baseline EF 60-65% with impaired relaxation (grade I diastolic dysfunction).  Stable on repeat echo 04/23/2021. will monitor q3 months on herceptin -Ms. Widener appears stable. S/p cycle 9 (of 12) weekly abraxane/herceptin, she had been tolerating very well with mild fatigue and nausea on day 3, now she has progressive fatigue, GERD, new leg pain, and mild neuropathy. She is able to recover and function well but with more effort.  -labs reviewed, adequate for treatment. She will proceed with cycle 10 today as planned. I recommend to dose-reduce abraxane for SE's.  -f/up next week, she understands we may further dose reduce or stop early if she has worsening side effects.  -I will write a letter for her return to work and send via [facility]  2. Chemotherapy side effects: metallic taste, nausea, GERD, fatigue, bone pain, neuropathy  -She tolerated chemo very well for the most pain, mainly with fatigue and nausea on day 3.  -s/p cycle 9 she has more GERD, progressive fatigue, and new lower leg bone pain R>L, and mild neuropathy in fingertips L>R. She has mild deficit on tuning fork exam -continue cryotherapy and B complex vitamin -will switch omeprazole to pantoprazole -symptom management for bone pain, which is common for taxanes. Use heat and NSAIDs PRN -hold lipitor for the duration of chemo   3.  Elevated blood pressure and anxiety -She has been very anxious since her diagnosis, BP elevated in clinic last few visits -no previous HTN diagnosis or treatment.  She is on metoprolol for tachycardia. -She will check BP at home and let us know if it remains elevated   PLAN: -Labs reviewed -Proceed with cycle 10 abraxane and herceptin today, dose reduce abraxane to 80 mg/m2 for SE's (fatigue, GERD, bone pain, neuropathy) -stop omeprazole, change to pantoprazole -hold lipitor for bone pain -continue cryotherapy and B complex vitamin -return to work note sent to  [employer] -F/up next week for C11 and close f/up  All questions were answered. The patient knows to call the clinic with any problems, questions or concerns. No barriers to learning was detected. I spent 20 minutes counseling the patient face to face. The total time spent in the  appointment was 30 minutes and more than 50% was on counseling and review of test results     Alla Feeling, NP 04/29/21

## 2021-04-28 ENCOUNTER — Other Ambulatory Visit: Payer: Self-pay

## 2021-04-28 ENCOUNTER — Encounter: Payer: Self-pay | Admitting: Hematology

## 2021-04-28 ENCOUNTER — Ambulatory Visit: Payer: Managed Care, Other (non HMO)

## 2021-04-28 VITALS — Wt 126.2 lb

## 2021-04-28 DIAGNOSIS — Z483 Aftercare following surgery for neoplasm: Secondary | ICD-10-CM

## 2021-04-28 NOTE — Therapy (Signed)
[facility] @ [address] [place], [place], [address] Phone: [phone]   Fax:  [phone]  Physical Therapy Treatment  Patient Details  Name: Dana Kramer MRN: [id number] Date of Birth: [birth year] Referring Provider (PT): Dr. Erroll Luna   Encounter Date: 04/28/2021   PT End of Session - 04/28/21 1009     Visit Number 11   # unchanged due to screen only   PT Start Time 1006    PT Stop Time 1016    PT Time Calculation (min) 10 min    Activity Tolerance Patient tolerated treatment well    Behavior During Therapy [facility] for tasks assessed/performed             Past Medical History:  Diagnosis Date   Allergy 11/1998   Penicillin   Chicken pox    History of transfusion of whole blood    Hyperlipidemia    Malignant neoplasm of upper-outer quadrant of right breast in female, estrogen receptor positive ([facility]) 01/13/2021   Migraine    Thyroid disease    Hypothyroidism    Past Surgical History:  Procedure Laterality Date   ABDOMINOPLASTY  10/01/2010   CESAREAN SECTION     twice   HERNIA REPAIR     PORTACATH PLACEMENT Right 01/23/2021   Procedure: Right internal jugular 8 [place] PowerPort with C arm and ultrasound guidance;  Surgeon: Erroll Luna, MD;  Location: [facility];  Service: General;  Laterality: Right;   SENTINEL NODE BIOPSY Right 01/23/2021   Procedure: Right axillary sentinel lymph node mapping using Mag Trace;  Surgeon: Erroll Luna, MD;  Location: [facility];  Service: General;  Laterality: Right;   SIMPLE MASTECTOMY WITH AXILLARY SENTINEL NODE BIOPSY Bilateral 01/23/2021   Procedure: Right simple mastectomy ;  Left risk reducing simple mastectomy;  Surgeon: Erroll Luna, MD;  Location: [facility];  Service: General;  Laterality: Bilateral;   TUBAL LIGATION     umblica hernia repair  63/84/5364    Vitals:   04/28/21 1008  Weight: 126 lb 4 oz (57.3 kg)     Subjective Assessment - 04/28/21 1008      Subjective Pt returns for her 3 month L-Dex screen.    Pertinent History Patient was diagnosed on 12/10/2020 with right grade II invasive ductal carcinoma breast cancer. She underwent a bilateral mastectomy with a right sentinel node biopsy (4 negative nodes) on 01/23/2021. It is ER/PR positive and HER2 negative with a Ki67 of 10%.                    L-DEX FLOWSHEETS - 04/28/21 1000       L-DEX LYMPHEDEMA SCREENING   Measurement Type Unilateral    L-DEX MEASUREMENT EXTREMITY Upper Extremity    POSITION  Standing    DOMINANT SIDE Right    At Risk Side Right    BASELINE SCORE (UNILATERAL) -4.5    L-DEX SCORE (UNILATERAL) -0.9    VALUE CHANGE (UNILAT) 3.6                                     PT Long Term Goals - 03/26/21 0910       PT LONG TERM GOAL #1   Title Patient will demonstrate she has regained full shoulder ROM and function post operatively compared to baselines.    Baseline Rt Flex: 160, abd: 180,    Lt Flex: 155,  Abd: 180    Status Partially Met      PT LONG TERM GOAL #2   Title Patient will increase bilateral shoulder flexion to >/= 150 degrees for increased ease reaching.    Baseline 5 deg lacking on Lt    Status Partially Met      PT LONG TERM GOAL #3   Title Patient will increase bil shoulder abduction to >/= 160 degrees for increased ease with normal daily tasks and work tasks.    Status Partially Met      PT LONG TERM GOAL #4   Title Patient will be independent with her HEP for shoulder ROM.    Status On-going      PT LONG TERM GOAL #5   Title Patient will verbalize good understanding of lymphedema risk reduction practices after attending the After Breast Cancer Class.    Status Achieved                   Plan - 04/28/21 1010     Clinical Impression Statement Pt returns for her 3 month L-Dex screen. Her change from baseline of 3.6 is WNLs so no further treatment is required at this time except to cont every 3  month L-Dex screens which pt is agreeable to.    PT Next Visit Plan Cont every 3 month L-Dex screens for up to 2 years from her SLNB (~01/24/2023)    Consulted and Agree with Plan of Care Patient             Patient will benefit from skilled therapeutic intervention in order to improve the following deficits and impairments:     Visit Diagnosis: Aftercare following surgery for neoplasm     Problem List Patient Active Problem List   Diagnosis Date Noted   Port-A-Cath in place 03/04/2021   Genetic testing 01/27/2021   S/P mastectomy, bilateral 01/23/2021   Family history of colon cancer 01/15/2021   Malignant neoplasm of upper-outer quadrant of right breast in female, estrogen receptor positive ([facility]) 01/13/2021   Pre-diabetes 11/06/2020   Acquired hypothyroidism 04/14/2018   Migraine 01/18/2015   Insomnia 11/05/2011   Anxiety 11/05/2011   Hyperlipidemia 11/05/2011   Tachycardia 39/67/2897   Umbilical hernia 91/50/4136    Otelia Limes, PTA 04/28/2021, 10:17 AM  [facility] @ [address] [place], [place], [address] Phone: [phone]   Fax:  [phone]  Name: Mishelle Hassan MRN: [id number] Date of Birth: April 07, 1965

## 2021-04-29 ENCOUNTER — Inpatient Hospital Stay: Payer: Managed Care, Other (non HMO)

## 2021-04-29 ENCOUNTER — Other Ambulatory Visit: Payer: Self-pay | Admitting: Nurse Practitioner

## 2021-04-29 ENCOUNTER — Inpatient Hospital Stay (HOSPITAL_BASED_OUTPATIENT_CLINIC_OR_DEPARTMENT_OTHER): Payer: Managed Care, Other (non HMO) | Admitting: Nurse Practitioner

## 2021-04-29 ENCOUNTER — Encounter: Payer: Self-pay | Admitting: Nurse Practitioner

## 2021-04-29 ENCOUNTER — Encounter: Payer: Self-pay | Admitting: *Deleted

## 2021-04-29 VITALS — BP 133/85 | HR 92 | Temp 96.8°F | Resp 17 | Wt 129.2 lb

## 2021-04-29 DIAGNOSIS — T451X5A Adverse effect of antineoplastic and immunosuppressive drugs, initial encounter: Secondary | ICD-10-CM

## 2021-04-29 DIAGNOSIS — C50411 Malignant neoplasm of upper-outer quadrant of right female breast: Secondary | ICD-10-CM

## 2021-04-29 DIAGNOSIS — G620 Drug-induced polyneuropathy: Secondary | ICD-10-CM

## 2021-04-29 DIAGNOSIS — Z170 Estrogen receptor positive status [ER+]: Secondary | ICD-10-CM

## 2021-04-29 DIAGNOSIS — Z95828 Presence of other vascular implants and grafts: Secondary | ICD-10-CM

## 2021-04-29 DIAGNOSIS — Z5112 Encounter for antineoplastic immunotherapy: Secondary | ICD-10-CM | POA: Diagnosis not present

## 2021-04-29 LAB — CBC WITH DIFFERENTIAL (CANCER CENTER ONLY)
Abs Immature Granulocytes: 0.02 10*3/uL (ref 0.00–0.07)
Basophils Absolute: 0 10*3/uL (ref 0.0–0.1)
Basophils Relative: 1 %
Eosinophils Absolute: 0.1 10*3/uL (ref 0.0–0.5)
Eosinophils Relative: 2 %
HCT: 29.3 % — ABNORMAL LOW (ref 36.0–46.0)
Hemoglobin: 10.1 g/dL — ABNORMAL LOW (ref 12.0–15.0)
Immature Granulocytes: 1 %
Lymphocytes Relative: 51 %
Lymphs Abs: 2.2 10*3/uL (ref 0.7–4.0)
MCH: 29.2 pg (ref 26.0–34.0)
MCHC: 34.5 g/dL (ref 30.0–36.0)
MCV: 84.7 fL (ref 80.0–100.0)
Monocytes Absolute: 0.2 10*3/uL (ref 0.1–1.0)
Monocytes Relative: 6 %
Neutro Abs: 1.7 10*3/uL (ref 1.7–7.7)
Neutrophils Relative %: 39 %
Platelet Count: 243 10*3/uL (ref 150–400)
RBC: 3.46 MIL/uL — ABNORMAL LOW (ref 3.87–5.11)
RDW: 13.6 % (ref 11.5–15.5)
WBC Count: 4.2 10*3/uL (ref 4.0–10.5)
nRBC: 0 % (ref 0.0–0.2)

## 2021-04-29 LAB — CMP (CANCER CENTER ONLY)
ALT: 11 U/L (ref 0–44)
AST: 20 U/L (ref 15–41)
Albumin: 4.2 g/dL (ref 3.5–5.0)
Alkaline Phosphatase: 49 U/L (ref 38–126)
Anion gap: 7 (ref 5–15)
BUN: 11 mg/dL (ref 6–20)
CO2: 25 mmol/L (ref 22–32)
Calcium: 8.9 mg/dL (ref 8.9–10.3)
Chloride: 108 mmol/L (ref 98–111)
Creatinine: 0.75 mg/dL (ref 0.44–1.00)
GFR, Estimated: >60 mL/min (ref >60)
Glucose, Bld: 136 mg/dL — ABNORMAL HIGH (ref 70–99)
Potassium: 3.8 mmol/L (ref 3.5–5.1)
Sodium: 140 mmol/L (ref 135–145)
Total Bilirubin: 0.3 mg/dL (ref 0.3–1.2)
Total Protein: 6.6 g/dL (ref 6.5–8.1)

## 2021-04-29 MED ORDER — SODIUM CHLORIDE 0.9 % IV SOLN: Freq: Once | INTRAVENOUS | Status: AC

## 2021-04-29 MED ORDER — SODIUM CHLORIDE 0.9% FLUSH
10.0000 mL | INTRAVENOUS | Status: DC | PRN
  Administered 2021-04-29: 10 mL

## 2021-04-29 MED ORDER — ACETAMINOPHEN 325 MG PO TABS
650.0000 mg | ORAL_TABLET | Freq: Once | ORAL | Status: AC
  Administered 2021-04-29: 650 mg via ORAL
  Filled 2021-04-29: qty 2

## 2021-04-29 MED ORDER — SODIUM CHLORIDE 0.9% FLUSH
10.0000 mL | Freq: Once | INTRAVENOUS | Status: AC
  Administered 2021-04-29: 10 mL

## 2021-04-29 MED ORDER — PACLITAXEL PROTEIN-BOUND CHEMO INJECTION 100 MG
80.0000 mg/m2 | Freq: Once | INTRAVENOUS | Status: AC
  Administered 2021-04-29: 125 mg via INTRAVENOUS
  Filled 2021-04-29: qty 25

## 2021-04-29 MED ORDER — DIPHENHYDRAMINE HCL 50 MG/ML IJ SOLN
25.0000 mg | Freq: Once | INTRAMUSCULAR | Status: AC
  Administered 2021-04-29: 25 mg via INTRAVENOUS
  Filled 2021-04-29: qty 1

## 2021-04-29 MED ORDER — HEPARIN SOD (PORK) LOCK FLUSH 100 UNIT/ML IV SOLN
500.0000 [IU] | Freq: Once | INTRAVENOUS | Status: AC | PRN
  Administered 2021-04-29: 500 [IU]

## 2021-04-29 MED ORDER — PANTOPRAZOLE SODIUM 40 MG PO TBEC: 40.0000 mg | DELAYED_RELEASE_TABLET | Freq: Every day | ORAL | 0 refills | Status: DC

## 2021-04-29 MED ORDER — TRASTUZUMAB-ANNS CHEMO 150 MG IV SOLR
2.0000 mg/kg | Freq: Once | INTRAVENOUS | Status: AC
  Administered 2021-04-29: 105 mg via INTRAVENOUS
  Filled 2021-04-29: qty 5

## 2021-04-29 NOTE — Progress Notes (Signed)
Decrease abraxane to 80mg /m2 today per Cira Rue, NP

## 2021-04-29 NOTE — Patient Instructions (Signed)
[facility]   Discharge Instructions: Thank you for choosing [facility] to provide your oncology and hematology care.   If you have a lab appointment with the [facility], please go directly to the [facility] and check in at the registration area.   Wear comfortable clothing and clothing appropriate for easy access to any Portacath or PICC line.   We strive to give you quality time with your provider. You may need to reschedule your appointment if you arrive late (15 or more minutes).  Arriving late affects you and other patients whose appointments are after yours.  Also, if you miss three or more appointments without notifying the office, you may be dismissed from the clinic at the providers discretion.      For prescription refill requests, have your pharmacy contact our office and allow 72 hours for refills to be completed.    Today you received the following chemotherapy and/or immunotherapy agents: trastuzumab-anns and paclitaxel (protein-bound)      To help prevent nausea and vomiting after your treatment, we encourage you to take your nausea medication as directed.  BELOW ARE SYMPTOMS THAT SHOULD BE REPORTED IMMEDIATELY: *FEVER GREATER THAN 100.4 F (38 C) OR HIGHER *CHILLS OR SWEATING *NAUSEA AND VOMITING THAT IS NOT CONTROLLED WITH YOUR NAUSEA MEDICATION *UNUSUAL SHORTNESS OF BREATH *UNUSUAL BRUISING OR BLEEDING *URINARY PROBLEMS (pain or burning when urinating, or frequent urination) *BOWEL PROBLEMS (unusual diarrhea, constipation, pain near the anus) TENDERNESS IN MOUTH AND THROAT WITH OR WITHOUT PRESENCE OF ULCERS (sore throat, sores in mouth, or a toothache) UNUSUAL RASH, SWELLING OR PAIN  UNUSUAL VAGINAL DISCHARGE OR ITCHING   Items with * indicate a potential emergency and should be followed up as soon as possible or go to the Emergency Department if any problems should occur.  Please show the CHEMOTHERAPY ALERT CARD or  IMMUNOTHERAPY ALERT CARD at check-in to the Emergency Department and triage nurse.  Should you have questions after your visit or need to cancel or reschedule your appointment, please contact [facility]  Dept: [phone]  and follow the prompts.  Office hours are 8:00 a.m. to 4:30 p.m. Monday - Friday. Please note that voicemails left after 4:00 p.m. may not be returned until the following business day.  We are closed weekends and major holidays. You have access to a nurse at all times for urgent questions. Please call the main number to the clinic Dept: [phone] and follow the prompts.   For any non-urgent questions, you may also contact your provider using MyChart. We now offer e-Visits for anyone 14 and older to request care online for non-urgent symptoms. For details visit [email or web address].   Also download the MyChart app! Go to the app store, search "MyChart", open the app, select [facility], and log in with your MyChart username and password.  Due to Covid, a mask is required upon entering the hospital/clinic. If you do not have a mask, one will be given to you upon arrival. For doctor visits, patients may have 1 support person aged 64 or older with them. For treatment visits, patients cannot have anyone with them due to current Covid guidelines and our immunocompromised population.

## 2021-05-01 ENCOUNTER — Telehealth: Payer: Self-pay | Admitting: Hematology

## 2021-05-01 ENCOUNTER — Encounter: Payer: Self-pay | Admitting: Hematology

## 2021-05-01 NOTE — Telephone Encounter (Signed)
Rescheduled upcoming appointment per 2/21 los. Patient is aware of changes.

## 2021-05-03 ENCOUNTER — Other Ambulatory Visit: Payer: Self-pay | Admitting: Hematology

## 2021-05-05 NOTE — Progress Notes (Signed)
[facility]   [phone] [phone]   Clinic Follow up Note   Patient Care Team: Copland, Gay Filler, MD as PCP - General (Family Medicine) Erroll Luna, MD as Consulting Physician (General Surgery) Truitt Merle, MD as Consulting Physician (Hematology) Eppie Gibson, MD as Attending Physician (Radiation Oncology) Mauro Kaufmann, RN as Oncology Nurse Navigator [name], RN as Oncology Nurse Navigator  Date of Service:  05/06/2021  CHIEF COMPLAINT: f/u of right breast cancer  CURRENT THERAPY:  Adjuvant Abraxane/Herceptin weekly x12, starting 02/25/21  ASSESSMENT & PLAN:  Dana Kramer is a 56 y.o. female with   1. Malignant neoplasm of upper-outer quadrant of right breast, Stage IA, c(T1bN0M0), Triple positive, Grade 2 -screening detected 0.7 cm upper-outer right breast mass with associated calcifications extending 1.5 cm. No abnormal right lymph nodes or left malignancy. 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 -S/p b/l mastectomies on 01/23/21 with Dr. Brantley Stage. 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 Port was placed during surgery -Due to Her2+ disease adjuvant chemotherapy was recommended, she began weekly Abraxane/Herceptin x12 weeks on 02/25/21, to be followed by maintenance Herceptin every 3 weeks to total a year.  -Baseline EF 60-65% with impaired relaxation (grade I diastolic dysfunction).  Stable on repeat echo 04/23/2021. will monitor q3 months on herceptin -She has been tolerating chemotherapy well overall, does notice mild numbness on her fingers, no impact on her hand function.  No noticeable neuropathy on feet.  Exam today showed mild reduced sensation of vibration on both hands and feet. -Lab reviewed, adequate for treatment, will proceed week Abraxane with started reduced dose at 70 mg/m today, and continue trastuzumab -We will check if her insurance would approve Herceptin  injection while we switch her to maintenance trastuzumab -f/u next week before last dose Abraxane  2. Chemotherapy side effects: metallic taste, nausea, GERD, fatigue, bone pain, G1 neuropathy  -She tolerated chemo very well for the most pain, mainly with fatigue and nausea on day 3.  -s/p cycle 9 she has more GERD, progressive fatigue, and new lower leg bone pain R>L, and mild neuropathy in fingertips L>R. She has mild deficit on tuning fork exam -continue cryotherapy and B complex vitamin -will switch omeprazole to pantoprazole -symptom management for bone pain, which is common for taxanes. Use heat and NSAIDs PRN -hold lipitor for the duration of chemo       PLAN: -Lab reviewed, before treatment, will proceed week 11 Abraxane at a slightly reduced dose 70 mg/m due to her peripheral neuropathy, and continue weekly trastuzumab -Lab, flush, follow-up and last dose of Abraxane and trastuzumab next week    No problem-specific Assessment & Plan notes found for this encounter.   SUMMARY OF ONCOLOGIC HISTORY: Oncology History Overview Note   Cancer Staging  Malignant neoplasm of upper-outer quadrant of right breast in female, estrogen receptor positive ([place]) Staging form: Breast, AJCC 8th Edition - Clinical stage from 01/08/2021: Stage IA (cT1b, cN0, cM0, G2, ER+, PR+, HER2+) - Signed by Truitt Merle, MD on 01/15/2021 - Pathologic stage from 01/23/2021: Stage IA (pT1c, pN0, cM0, G2, ER+, PR+, HER2+) - Signed by Truitt Merle, MD on 02/13/2021     Malignant neoplasm of upper-outer quadrant of right breast in female, estrogen receptor positive ([place])  01/02/2021 Mammogram   EXAM: DIGITAL DIAGNOSTIC BILATERAL MAMMOGRAM WITH TOMOSYNTHESIS AND CAD; ULTRASOUND LEFT BREAST LIMITED; ULTRASOUND RIGHT BREAST LIMITED  IMPRESSION: 1. Highly suspicious 0.7 cm UPPER-OUTER RIGHT  breast mass with associated suspicious calcifications extending 1.5 cm anteriorly. Tissue sampling is recommended. 2. No abnormal  appearing RIGHT axillary lymph nodes. 3. Benign LOWER LEFT breast cyst corresponding to the LEFT breast screening study finding.   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  PROGNOSTIC INDICATORS Results: The tumor cells are 2+ for Her2 (EQUIVOCAL). Her2 by FISH will be performed and results reported separately. Estrogen Receptor: 100%, POSITIVE, STRONG STAINING INTENSITY Progesterone Receptor: 70%, POSITIVE, STRONG STAINING INTENSITY Proliferation Marker Ki67: 10%  FLUORESCENCE IN-SITU HYBRIDIZATION Results: GROUP 1: HER2 **POSITIVE**   01/13/2021 Initial Diagnosis   Malignant neoplasm of upper-outer quadrant of right breast in female, estrogen receptor positive ([place])   01/15/2021 Genetic Testing   [facility] CustomNext Panel was Negative. Report date is 01/26/2021.  The CustomNext-Cancer+RNAinsight panel offered by [facility] includes sequencing and rearrangement analysis for the following 47 genes:  APC, ATM, AXIN2, BARD1, BMPR1A, BRCA1, BRCA2, BRIP1, CDH1, CDK4, CDKN2A, CHEK2, CTNNA1, DICER1, EPCAM, GREM1, HOXB13, KIT, MEN1, MLH1, MSH2, MSH3, MSH6, MUTYH, NBN, NF1, NTHL1, PALB2, PDGFRA, PMS2, POLD1, POLE, PTEN, RAD50, RAD51C, RAD51D, SDHA, SDHB, SDHC, SDHD, SMAD4, SMARCA4, STK11, TP53, TSC1, TSC2, and VHL.  RNA data is routinely analyzed for use in variant interpretation for all genes.   01/23/2021  Cancer Staging   Staging form: Breast, AJCC 8th Edition - Pathologic stage from 01/23/2021: Stage IA (pT1c, pN0, cM0, G2, ER+, PR+, HER2+) - Signed by Truitt Merle, MD on 02/13/2021 Histologic grading system: 3 grade system Residual tumor (R): R0 - None    01/23/2021 Definitive Surgery   FINAL MICROSCOPIC DIAGNOSIS:   A. BREAST, LEFT, MASTECTOMY:  - Fibrocystic changes with sclerosing adenosis and calcifications.  - Fibroadenoma (0.6 cm).  - No evidence of malignancy.   B. BREAST, RIGHT, MASTECTOMY:  - Invasive and in situ ductal carcinoma, 1.7 cm.  - Margins negative for carcinoma.  - Biopsy site and biopsy clip.  - See oncology table.   C. LYMPH NODE, RIGHT AXILLARY, SENTINEL, EXCISION:  - One lymph node negative for metastatic carcinoma (0/1).   D. LYMPH NODE, RIGHT AXILLARY, SENTINEL, EXCISION:  - One lymph node negative for metastatic carcinoma (0/1).   E. LYMPH NODE, RIGHT AXILLARY, SENTINEL, EXCISION:  - One lymph node negative for metastatic carcinoma (0/1).   F. LYMPH NODE, RIGHT AXILLARY, SENTINEL, EXCISION:  - One lymph node negative for metastatic carcinoma (0/1).    02/25/2021 -  Chemotherapy   Patient is on Treatment Plan : BREAST Paclitaxel + Trastuzumab q7d / Trastuzumab q21d        INTERVAL HISTORY:  Dana Kramer is here for a follow up of breast cancer. She was last seen by NP Lacie on 04/29/21. She presents to the clinic with her daughter.  She has been overall tolerating chemotherapy well, her main complaint is lower extremity pain below knee, no leg or join edema, no muscular cramps, she is able to walk normally.  He has  been taking ibuprofen which helps a lot.  She noticed mild numbness on her fingers, not in her toes, but no impact on her hand functions.  Mild fatigue, she is fully functional at home.  No other complaints.   All other systems were reviewed with the patient and are negative.  MEDICAL HISTORY:  Past Medical History:  Diagnosis Date    Allergy 11/1998   Penicillin   Chicken pox    History of transfusion of whole blood    Hyperlipidemia    Malignant neoplasm of upper-outer quadrant of right breast in female, estrogen receptor positive ([place]) 01/13/2021   Migraine    Thyroid disease    Hypothyroidism    SURGICAL HISTORY: Past Surgical History:  Procedure Laterality Date   ABDOMINOPLASTY  10/01/2010   CESAREAN SECTION     twice   HERNIA REPAIR     PORTACATH PLACEMENT Right 01/23/2021   Procedure: Right internal jugular 8 [place] PowerPort with C arm and ultrasound guidance;  Surgeon: Erroll Luna, MD;  Location: [facility];  Service: General;  Laterality: Right;   SENTINEL NODE BIOPSY Right 01/23/2021   Procedure: Right axillary sentinel lymph node mapping using Mag Trace;  Surgeon: Erroll Luna, MD;  Location: [facility];  Service: General;  Laterality: Right;   SIMPLE MASTECTOMY WITH AXILLARY SENTINEL NODE BIOPSY Bilateral 01/23/2021   Procedure: Right simple mastectomy ;  Left risk reducing simple mastectomy;  Surgeon: Erroll Luna, MD;  Location: [facility];  Service: General;  Laterality: Bilateral;   TUBAL LIGATION     umblica hernia repair  79/04/4095    I have reviewed the social history and family history with the patient and they are unchanged from previous note.  ALLERGIES:  is allergic to penicillins.  MEDICATIONS:  Current Outpatient Medications  Medication Sig Dispense Refill   amLODipine (NORVASC) 5 MG tablet Take 1 tablet (5 mg total) by mouth daily. 90 tablet 3   b complex vitamins capsule Take 1 capsule by mouth daily.     Calcium Citrate-Vitamin D (CALCIUM + D PO) Take 1 tablet by mouth daily.     Cholecalciferol (VITAMIN D PO) Take 1 tablet by mouth daily.     cyclobenzaprine (FLEXERIL) 5 MG tablet Take 1 tablet (5 mg total) by mouth 3 (three) times daily as needed for muscle spasms. 10 tablet 0   ibuprofen (ADVIL) 800 MG tablet Take 1 tablet (800 mg total) by mouth every 8 (eight) hours as needed.  30 tablet 0   Ibuprofen-diphenhydrAMINE HCl (ADVIL PM) 200-25 MG CAPS Take 2 tablets by mouth daily as needed (pain).     levothyroxine (SYNTHROID) 25 MCG tablet TAKE 1 TABLET(25 MCG) BY MOUTH DAILY BEFORE BREAKFAST 90 tablet 3   lidocaine-prilocaine (EMLA) cream Apply 1 application topically as needed (To be applied to portacath 30 to 60 mins prior to portacath access). 30 g 2   Melatonin 10 MG CAPS Take 10 mg by mouth at bedtime.     metoprolol succinate (TOPROL-XL) 50 MG 24 hr tablet Take 1 tablet (50 mg total) by mouth daily. Take with or immediately following a meal. 90 tablet 3   Multiple Vitamins-Minerals (WOMENS MULTIVITAMIN PO) Take 1 tablet by mouth daily.     ondansetron (ZOFRAN) 8 MG tablet Take 1 tablet (8 mg total) by mouth 2 (two) times daily as needed (Nausea or vomiting). 30 tablet 1   oxyCODONE (OXY IR/ROXICODONE) 5 MG immediate release tablet Take 1 tablet (5 mg total) by mouth every 6 (six)  hours as needed for severe pain. 15 tablet 0   pantoprazole (PROTONIX) 40 MG tablet Take 1 tablet (40 mg total) by mouth daily. 30 tablet 0   prochlorperazine (COMPAZINE) 10 MG tablet TAKE 1 TABLET(10 MG) BY MOUTH EVERY 6 HOURS AS NEEDED FOR NAUSEA OR VOMITING 30 tablet 1   simvastatin (ZOCOR) 20 MG tablet TAKE 1 TABLET(20 MG) BY MOUTH AT BEDTIME 90 tablet 3   No current facility-administered medications for this visit.   Facility-Administered Medications Ordered in Other Visits  Medication Dose Route Frequency Provider Last Rate Last Admin   sodium chloride flush (NS) 0.9 % injection 10 mL  10 mL Intracatheter PRN Truitt Merle, MD   10 mL at 05/06/21 1224    PHYSICAL EXAMINATION: ECOG PERFORMANCE STATUS: 1 - Symptomatic but completely ambulatory  Vitals:   05/06/21 0928  BP: 130/89  Pulse: (!) 101  Resp: 18  Temp: 98.3 F (36.8 C)  SpO2: 99%   Wt Readings from Last 3 Encounters:  05/06/21 126 lb 6.4 oz (57.3 kg)  04/29/21 129 lb 3 oz (58.6 kg)  04/28/21 126 lb 4 oz (57.3 kg)      GENERAL:alert, no distress and comfortable SKIN: skin color, texture, turgor are normal, no rashes or significant lesions EYES: normal, Conjunctiva are pink and non-injected, sclera clear NECK: supple, thyroid normal size, non-tender, without nodularity LYMPH:  no palpable lymphadenopathy in the cervical, axillary  LUNGS: clear to auscultation and percussion with normal breathing effort HEART: regular rate & rhythm and no murmurs and no lower extremity edema ABDOMEN:abdomen soft, non-tender and normal bowel sounds Musculoskeletal:no cyanosis of digits and no clubbing  NEURO: alert & oriented x 3 with fluent speech, no focal motor/sensory deficits, except mild reduction of vibration sensation on hands and feet.  LABORATORY DATA:  I have reviewed the data as listed CBC Latest Ref Rng & Units 05/06/2021 04/29/2021 04/22/2021  WBC 4.0 - 10.5 K/uL 4.4 4.2 4.4  Hemoglobin 12.0 - 15.0 g/dL 10.7(L) 10.1(L) 11.0(L)  Hematocrit 36.0 - 46.0 % 31.5(L) 29.3(L) 32.3(L)  Platelets 150 - 400 K/uL 255 243 290     CMP Latest Ref Rng & Units 05/06/2021 04/29/2021 04/22/2021  Glucose 70 - 99 mg/dL 127(H) 136(H) 121(H)  BUN 6 - 20 mg/dL $Remove'7 11 14  'AVToELQ$ Creatinine 0.44 - 1.00 mg/dL 0.78 0.75 0.80  Sodium 135 - 145 mmol/L 139 140 138  Potassium 3.5 - 5.1 mmol/L 3.9 3.8 4.5  Chloride 98 - 111 mmol/L 104 108 105  CO2 22 - 32 mmol/L $RemoveB'26 25 27  'omBObQPf$ Calcium 8.9 - 10.3 mg/dL 10.8(H) 8.9 9.2  Total Protein 6.5 - 8.1 g/dL 7.2 6.6 6.9  Total Bilirubin 0.3 - 1.2 mg/dL 0.4 0.3 0.4  Alkaline Phos 38 - 126 U/L 52 49 51  AST 15 - 41 U/L $Remo'20 20 21  'sshuJ$ ALT 0 - 44 U/L $Remo'11 11 11      'ISEPF$ RADIOGRAPHIC STUDIES: I have personally reviewed the radiological images as listed and agreed with the findings in the report. No results found.    No orders of the defined types were placed in this encounter.  All questions were answered. The patient knows to call the clinic with any problems, questions or concerns. No barriers to learning was  detected. The total time spent in the appointment was 30 minutes.     Truitt Merle, MD 05/06/2021   I, Wilburn Mylar, am acting as scribe for Truitt Merle, MD.   I have reviewed the above documentation for  accuracy and completeness, and I agree with the above.

## 2021-05-06 ENCOUNTER — Inpatient Hospital Stay: Payer: Managed Care, Other (non HMO) | Admitting: Hematology

## 2021-05-06 ENCOUNTER — Other Ambulatory Visit: Payer: Self-pay

## 2021-05-06 ENCOUNTER — Inpatient Hospital Stay: Payer: Managed Care, Other (non HMO)

## 2021-05-06 ENCOUNTER — Encounter: Payer: Self-pay | Admitting: Hematology

## 2021-05-06 ENCOUNTER — Inpatient Hospital Stay (HOSPITAL_BASED_OUTPATIENT_CLINIC_OR_DEPARTMENT_OTHER): Payer: Managed Care, Other (non HMO)

## 2021-05-06 VITALS — HR 98

## 2021-05-06 VITALS — BP 130/89 | HR 101 | Temp 98.3°F | Resp 18 | Ht 60.0 in | Wt 126.4 lb

## 2021-05-06 DIAGNOSIS — C50411 Malignant neoplasm of upper-outer quadrant of right female breast: Secondary | ICD-10-CM

## 2021-05-06 DIAGNOSIS — Z95828 Presence of other vascular implants and grafts: Secondary | ICD-10-CM

## 2021-05-06 DIAGNOSIS — Z170 Estrogen receptor positive status [ER+]: Secondary | ICD-10-CM

## 2021-05-06 DIAGNOSIS — Z5112 Encounter for antineoplastic immunotherapy: Secondary | ICD-10-CM | POA: Diagnosis not present

## 2021-05-06 LAB — CBC WITH DIFFERENTIAL (CANCER CENTER ONLY)
Abs Immature Granulocytes: 0.02 10*3/uL (ref 0.00–0.07)
Basophils Absolute: 0 10*3/uL (ref 0.0–0.1)
Basophils Relative: 1 %
Eosinophils Absolute: 0.1 10*3/uL (ref 0.0–0.5)
Eosinophils Relative: 2 %
HCT: 31.5 % — ABNORMAL LOW (ref 36.0–46.0)
Hemoglobin: 10.7 g/dL — ABNORMAL LOW (ref 12.0–15.0)
Immature Granulocytes: 1 %
Lymphocytes Relative: 45 %
Lymphs Abs: 2 10*3/uL (ref 0.7–4.0)
MCH: 29 pg (ref 26.0–34.0)
MCHC: 34 g/dL (ref 30.0–36.0)
MCV: 85.4 fL (ref 80.0–100.0)
Monocytes Absolute: 0.2 10*3/uL (ref 0.1–1.0)
Monocytes Relative: 5 %
Neutro Abs: 2 10*3/uL (ref 1.7–7.7)
Neutrophils Relative %: 46 %
Platelet Count: 255 10*3/uL (ref 150–400)
RBC: 3.69 MIL/uL — ABNORMAL LOW (ref 3.87–5.11)
RDW: 13.9 % (ref 11.5–15.5)
WBC Count: 4.4 10*3/uL (ref 4.0–10.5)
nRBC: 0 % (ref 0.0–0.2)

## 2021-05-06 LAB — CMP (CANCER CENTER ONLY)
ALT: 11 U/L (ref 0–44)
AST: 20 U/L (ref 15–41)
Albumin: 4.4 g/dL (ref 3.5–5.0)
Alkaline Phosphatase: 52 U/L (ref 38–126)
Anion gap: 9 (ref 5–15)
BUN: 7 mg/dL (ref 6–20)
CO2: 26 mmol/L (ref 22–32)
Calcium: 10.8 mg/dL — ABNORMAL HIGH (ref 8.9–10.3)
Chloride: 104 mmol/L (ref 98–111)
Creatinine: 0.78 mg/dL (ref 0.44–1.00)
GFR, Estimated: >60 mL/min (ref >60)
Glucose, Bld: 127 mg/dL — ABNORMAL HIGH (ref 70–99)
Potassium: 3.9 mmol/L (ref 3.5–5.1)
Sodium: 139 mmol/L (ref 135–145)
Total Bilirubin: 0.4 mg/dL (ref 0.3–1.2)
Total Protein: 7.2 g/dL (ref 6.5–8.1)

## 2021-05-06 MED ORDER — SODIUM CHLORIDE 0.9% FLUSH
10.0000 mL | INTRAVENOUS | Status: DC | PRN
  Administered 2021-05-06: 10 mL

## 2021-05-06 MED ORDER — PACLITAXEL PROTEIN-BOUND CHEMO INJECTION 100 MG
70.0000 mg/m2 | Freq: Once | INTRAVENOUS | Status: AC
  Administered 2021-05-06: 100 mg via INTRAVENOUS
  Filled 2021-05-06: qty 20

## 2021-05-06 MED ORDER — DIPHENHYDRAMINE HCL 50 MG/ML IJ SOLN
25.0000 mg | Freq: Once | INTRAMUSCULAR | Status: AC
  Administered 2021-05-06: 25 mg via INTRAVENOUS
  Filled 2021-05-06: qty 1

## 2021-05-06 MED ORDER — SODIUM CHLORIDE 0.9 % IV SOLN: Freq: Once | INTRAVENOUS | Status: AC

## 2021-05-06 MED ORDER — TRASTUZUMAB-ANNS CHEMO 150 MG IV SOLR
2.0000 mg/kg | Freq: Once | INTRAVENOUS | Status: AC
  Administered 2021-05-06: 105 mg via INTRAVENOUS
  Filled 2021-05-06: qty 5

## 2021-05-06 MED ORDER — HEPARIN SOD (PORK) LOCK FLUSH 100 UNIT/ML IV SOLN
500.0000 [IU] | Freq: Once | INTRAVENOUS | Status: AC | PRN
  Administered 2021-05-06: 500 [IU]

## 2021-05-06 MED ORDER — SODIUM CHLORIDE 0.9% FLUSH
10.0000 mL | Freq: Once | INTRAVENOUS | Status: AC
  Administered 2021-05-06: 10 mL

## 2021-05-06 MED ORDER — ACETAMINOPHEN 325 MG PO TABS
650.0000 mg | ORAL_TABLET | Freq: Once | ORAL | Status: AC
  Administered 2021-05-06: 650 mg via ORAL
  Filled 2021-05-06: qty 2

## 2021-05-06 NOTE — Patient Instructions (Signed)
[facility]  Discharge Instructions: Thank you for choosing [facility] to provide your oncology and hematology care.   If you have a lab appointment with the [facility], please go directly to the [facility] and check in at the registration area.   Wear comfortable clothing and clothing appropriate for easy access to any Portacath or PICC line.   We strive to give you quality time with your provider. You may need to reschedule your appointment if you arrive late (15 or more minutes).  Arriving late affects you and other patients whose appointments are after yours.  Also, if you miss three or more appointments without notifying the office, you may be dismissed from the clinic at the providers discretion.      For prescription refill requests, have your pharmacy contact our office and allow 72 hours for refills to be completed.    Today you received the following chemotherapy and/or immunotherapy agents: Kanjinti, abraxane      To help prevent nausea and vomiting after your treatment, we encourage you to take your nausea medication as directed.  BELOW ARE SYMPTOMS THAT SHOULD BE REPORTED IMMEDIATELY: *FEVER GREATER THAN 100.4 F (38 C) OR HIGHER *CHILLS OR SWEATING *NAUSEA AND VOMITING THAT IS NOT CONTROLLED WITH YOUR NAUSEA MEDICATION *UNUSUAL SHORTNESS OF BREATH *UNUSUAL BRUISING OR BLEEDING *URINARY PROBLEMS (pain or burning when urinating, or frequent urination) *BOWEL PROBLEMS (unusual diarrhea, constipation, pain near the anus) TENDERNESS IN MOUTH AND THROAT WITH OR WITHOUT PRESENCE OF ULCERS (sore throat, sores in mouth, or a toothache) UNUSUAL RASH, SWELLING OR PAIN  UNUSUAL VAGINAL DISCHARGE OR ITCHING   Items with * indicate a potential emergency and should be followed up as soon as possible or go to the Emergency Department if any problems should occur.  Please show the CHEMOTHERAPY ALERT CARD or IMMUNOTHERAPY ALERT CARD at  check-in to the Emergency Department and triage nurse.  Should you have questions after your visit or need to cancel or reschedule your appointment, please contact [facility]  Dept: [phone]  and follow the prompts.  Office hours are 8:00 a.m. to 4:30 p.m. Monday - Friday. Please note that voicemails left after 4:00 p.m. may not be returned until the following business day.  We are closed weekends and major holidays. You have access to a nurse at all times for urgent questions. Please call the main number to the clinic Dept: [phone] and follow the prompts.   For any non-urgent questions, you may also contact your provider using MyChart. We now offer e-Visits for anyone 21 and older to request care online for non-urgent symptoms. For details visit [email or web address].   Also download the MyChart app! Go to the app store, search "MyChart", open the app, select [facility], and log in with your MyChart username and password.  Due to Covid, a mask is required upon entering the hospital/clinic. If you do not have a mask, one will be given to you upon arrival. For doctor visits, patients may have 1 support person aged 38 or older with them. For treatment visits, patients cannot have anyone with them due to current Covid guidelines and our immunocompromised population.

## 2021-05-12 ENCOUNTER — Encounter: Payer: Self-pay | Admitting: *Deleted

## 2021-05-12 ENCOUNTER — Inpatient Hospital Stay: Payer: Managed Care, Other (non HMO)

## 2021-05-12 ENCOUNTER — Inpatient Hospital Stay: Payer: Managed Care, Other (non HMO) | Attending: Hematology

## 2021-05-12 ENCOUNTER — Other Ambulatory Visit: Payer: Self-pay

## 2021-05-12 ENCOUNTER — Encounter: Payer: Self-pay | Admitting: Hematology

## 2021-05-12 ENCOUNTER — Inpatient Hospital Stay: Payer: Managed Care, Other (non HMO) | Admitting: Hematology

## 2021-05-12 VITALS — BP 141/92 | HR 101 | Temp 98.4°F | Resp 18 | Ht 60.0 in | Wt 126.1 lb

## 2021-05-12 VITALS — HR 93

## 2021-05-12 DIAGNOSIS — Z95828 Presence of other vascular implants and grafts: Secondary | ICD-10-CM

## 2021-05-12 DIAGNOSIS — Z170 Estrogen receptor positive status [ER+]: Secondary | ICD-10-CM

## 2021-05-12 DIAGNOSIS — C50411 Malignant neoplasm of upper-outer quadrant of right female breast: Secondary | ICD-10-CM

## 2021-05-12 DIAGNOSIS — Z5112 Encounter for antineoplastic immunotherapy: Secondary | ICD-10-CM | POA: Insufficient documentation

## 2021-05-12 DIAGNOSIS — Z79899 Other long term (current) drug therapy: Secondary | ICD-10-CM | POA: Insufficient documentation

## 2021-05-12 DIAGNOSIS — Z5111 Encounter for antineoplastic chemotherapy: Secondary | ICD-10-CM | POA: Diagnosis present

## 2021-05-12 LAB — CMP (CANCER CENTER ONLY)
ALT: 9 U/L (ref 0–44)
AST: 19 U/L (ref 15–41)
Albumin: 4.4 g/dL (ref 3.5–5.0)
Alkaline Phosphatase: 47 U/L (ref 38–126)
Anion gap: 9 (ref 5–15)
BUN: 9 mg/dL (ref 6–20)
CO2: 22 mmol/L (ref 22–32)
Calcium: 9.6 mg/dL (ref 8.9–10.3)
Chloride: 108 mmol/L (ref 98–111)
Creatinine: 0.78 mg/dL (ref 0.44–1.00)
GFR, Estimated: >60 mL/min (ref >60)
Glucose, Bld: 138 mg/dL — ABNORMAL HIGH (ref 70–99)
Potassium: 4 mmol/L (ref 3.5–5.1)
Sodium: 139 mmol/L (ref 135–145)
Total Bilirubin: 0.4 mg/dL (ref 0.3–1.2)
Total Protein: 7.1 g/dL (ref 6.5–8.1)

## 2021-05-12 LAB — CBC WITH DIFFERENTIAL (CANCER CENTER ONLY)
Abs Immature Granulocytes: 0.02 10*3/uL (ref 0.00–0.07)
Basophils Absolute: 0 10*3/uL (ref 0.0–0.1)
Basophils Relative: 1 %
Eosinophils Absolute: 0.1 10*3/uL (ref 0.0–0.5)
Eosinophils Relative: 2 %
HCT: 31.9 % — ABNORMAL LOW (ref 36.0–46.0)
Hemoglobin: 10.7 g/dL — ABNORMAL LOW (ref 12.0–15.0)
Immature Granulocytes: 1 %
Lymphocytes Relative: 38 %
Lymphs Abs: 1.6 10*3/uL (ref 0.7–4.0)
MCH: 29 pg (ref 26.0–34.0)
MCHC: 33.5 g/dL (ref 30.0–36.0)
MCV: 86.4 fL (ref 80.0–100.0)
Monocytes Absolute: 0.3 10*3/uL (ref 0.1–1.0)
Monocytes Relative: 6 %
Neutro Abs: 2.2 10*3/uL (ref 1.7–7.7)
Neutrophils Relative %: 52 %
Platelet Count: 241 10*3/uL (ref 150–400)
RBC: 3.69 MIL/uL — ABNORMAL LOW (ref 3.87–5.11)
RDW: 14 % (ref 11.5–15.5)
WBC Count: 4.2 10*3/uL (ref 4.0–10.5)
nRBC: 0 % (ref 0.0–0.2)

## 2021-05-12 MED ORDER — HEPARIN SOD (PORK) LOCK FLUSH 100 UNIT/ML IV SOLN
500.0000 [IU] | Freq: Once | INTRAVENOUS | Status: AC | PRN
  Administered 2021-05-12: 500 [IU]

## 2021-05-12 MED ORDER — SODIUM CHLORIDE 0.9 % IV SOLN: Freq: Once | INTRAVENOUS | Status: AC

## 2021-05-12 MED ORDER — DIPHENHYDRAMINE HCL 50 MG/ML IJ SOLN
25.0000 mg | Freq: Once | INTRAMUSCULAR | Status: AC
  Administered 2021-05-12: 25 mg via INTRAVENOUS
  Filled 2021-05-12: qty 1

## 2021-05-12 MED ORDER — ACETAMINOPHEN 325 MG PO TABS
650.0000 mg | ORAL_TABLET | Freq: Once | ORAL | Status: AC
  Administered 2021-05-12: 650 mg via ORAL
  Filled 2021-05-12: qty 2

## 2021-05-12 MED ORDER — TRASTUZUMAB-ANNS CHEMO 150 MG IV SOLR
6.0000 mg/kg | Freq: Once | INTRAVENOUS | Status: AC
  Administered 2021-05-12: 336 mg via INTRAVENOUS
  Filled 2021-05-12: qty 16

## 2021-05-12 MED ORDER — PACLITAXEL PROTEIN-BOUND CHEMO INJECTION 100 MG
70.0000 mg/m2 | Freq: Once | INTRAVENOUS | Status: AC
  Administered 2021-05-12: 100 mg via INTRAVENOUS
  Filled 2021-05-12: qty 20

## 2021-05-12 MED ORDER — SODIUM CHLORIDE 0.9% FLUSH
10.0000 mL | Freq: Once | INTRAVENOUS | Status: AC
  Administered 2021-05-12: 10 mL

## 2021-05-12 MED ORDER — SODIUM CHLORIDE 0.9% FLUSH
10.0000 mL | INTRAVENOUS | Status: DC | PRN
  Administered 2021-05-12: 10 mL

## 2021-05-12 NOTE — Patient Instructions (Signed)
[facility]   ?Discharge Instructions: ?Thank you for choosing [facility] to provide your oncology and hematology care.  ? ?If you have a lab appointment with the [facility], please go directly to the [facility] and check in at the registration area. ?  ?Wear comfortable clothing and clothing appropriate for easy access to any Portacath or PICC line.  ? ?We strive to give you quality time with your provider. You may need to reschedule your appointment if you arrive late (15 or more minutes).  Arriving late affects you and other patients whose appointments are after yours.  Also, if you miss three or more appointments without notifying the office, you may be dismissed from the clinic at the provider?s discretion.    ?  ?For prescription refill requests, have your pharmacy contact our office and allow 72 hours for refills to be completed.   ? ?Today you received the following chemotherapy and/or immunotherapy agents: trastuzumab-anns and paclitaxel (protein-bound)    ?  ?To help prevent nausea and vomiting after your treatment, we encourage you to take your nausea medication as directed. ? ?BELOW ARE SYMPTOMS THAT SHOULD BE REPORTED IMMEDIATELY: ?*FEVER GREATER THAN 100.4 F (38 ?C) OR HIGHER ?*CHILLS OR SWEATING ?*NAUSEA AND VOMITING THAT IS NOT CONTROLLED WITH YOUR NAUSEA MEDICATION ?*UNUSUAL SHORTNESS OF BREATH ?*UNUSUAL BRUISING OR BLEEDING ?*URINARY PROBLEMS (pain or burning when urinating, or frequent urination) ?*BOWEL PROBLEMS (unusual diarrhea, constipation, pain near the anus) ?TENDERNESS IN MOUTH AND THROAT WITH OR WITHOUT PRESENCE OF ULCERS (sore throat, sores in mouth, or a toothache) ?UNUSUAL RASH, SWELLING OR PAIN  ?UNUSUAL VAGINAL DISCHARGE OR ITCHING  ? ?Items with * indicate a potential emergency and should be followed up as soon as possible or go to the Emergency Department if any problems should occur. ? ?Please show the CHEMOTHERAPY ALERT CARD or  IMMUNOTHERAPY ALERT CARD at check-in to the Emergency Department and triage nurse. ? ?Should you have questions after your visit or need to cancel or reschedule your appointment, please contact [facility]  Dept: [phone]  and follow the prompts.  Office hours are 8:00 a.m. to 4:30 p.m. Monday - Friday. Please note that voicemails left after 4:00 p.m. may not be returned until the following business day.  We are closed weekends and major holidays. You have access to a nurse at all times for urgent questions. Please call the main number to the clinic Dept: [phone] and follow the prompts. ? ? ?For any non-urgent questions, you may also contact your provider using MyChart. We now offer e-Visits for anyone 63 and older to request care online for non-urgent symptoms. For details visit [email or web address]. ?  ?Also download the MyChart app! Go to the app store, search "MyChart", open the app, select [facility], and log in with your MyChart username and password. ? ?Due to Covid, a mask is required upon entering the hospital/clinic. If you do not have a mask, one will be given to you upon arrival. For doctor visits, patients may have 1 support person aged 16 or older with them. For treatment visits, patients cannot have anyone with them due to current Covid guidelines and our immunocompromised population.  ? ?

## 2021-05-12 NOTE — Progress Notes (Signed)
[place]   Telephone:(336) [phone] Fax:(336) [phone]   Clinic Follow up Note   Patient Care Team: Copland, Gay Filler, MD as PCP - General (Family Medicine) Erroll Luna, MD as Consulting Physician (General Surgery) Truitt Merle, MD as Consulting Physician (Hematology) Eppie Gibson, MD as Attending Physician (Radiation Oncology) Mauro Kaufmann, RN as Oncology Nurse Navigator [name], RN as Oncology Nurse Navigator  Date of Service:  05/12/2021  CHIEF COMPLAINT: f/u of right breast cancer  CURRENT THERAPY:  Adjuvant Abraxane/Herceptin weekly x12, starting 02/25/21  ASSESSMENT & PLAN:  Dana Kramer is a 56 y.o. female with   1. Malignant neoplasm of upper-outer quadrant of right breast, Stage IA, c(T1bN0M0), Triple positive, Grade 2 -screening detected 0.7 cm upper-outer right breast mass with associated calcifications extending 1.5 cm. No abnormal right lymph nodes or left malignancy. 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 -S/p b/l mastectomies on 01/23/21 with Dr. Brantley Stage. 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 Port was placed during surgery -Due to Her2+ disease adjuvant chemotherapy was recommended, she began weekly Abraxane/Herceptin x12 weeks on 02/25/21, to be followed by maintenance Herceptin every 3 weeks to total a year.  -Baseline EF 60-65% with impaired relaxation (grade I diastolic dysfunction).  Stable on repeat echo 04/23/21. will monitor q3 months on herceptin -She has been tolerating chemotherapy well overall, does notice mild numbness on her fingers, no impact on her hand function.  No noticeable neuropathy on feet. Mild and stable. -Lab reviewed, adequate for treatment, will proceed with final Abraxane/trastuzumab today. Her neuropathy is stable  -We will check if her insurance would approve Herceptin injection while we switch her to maintenance trastuzumab. If approved, she can have her  port removed.   2. Chemotherapy side effects: metallic taste, nausea, GERD, fatigue, bone pain, G1 neuropathy  -She tolerated chemo very well for the most part, mainly with fatigue and nausea on day 3.  -s/p cycle 9 she has more GERD, progressive fatigue, and new lower leg bone pain R>L, and mild neuropathy in fingertips L>R. She has mild deficit on tuning fork exam -continue cryotherapy and B complex vitamin -she is now on pantoprazole -symptom management for bone pain, which is common for taxanes. Use heat and NSAIDs PRN -hold lipitor for the duration of chemo     PLAN: -proceed with final abraxane/trastuzumab -Lab, flush, follow-up and switch to maintenance trastuzumab in 3 and 6 weeks, will check if Herceptin Hylecta will be approved by her insurance  -f/u in 6 weeks. Plant to start her on antiestrogen on next visit    No problem-specific Assessment & Plan notes found for this encounter.   SUMMARY OF ONCOLOGIC HISTORY: Oncology History Overview Note   Cancer Staging  Malignant neoplasm of upper-outer quadrant of right breast in female, estrogen receptor positive ([place]) Staging form: Breast, AJCC 8th Edition - Clinical stage from 01/08/2021: Stage IA (cT1b, cN0, cM0, G2, ER+, PR+, HER2+) - Signed by Truitt Merle, MD on 01/15/2021 - Pathologic stage from 01/23/2021: Stage IA (pT1c, pN0, cM0, G2, ER+, PR+, HER2+) - Signed by Truitt Merle, MD on 02/13/2021     Malignant neoplasm of upper-outer quadrant of right breast in female, estrogen receptor positive ([place])  01/02/2021 Mammogram   EXAM: DIGITAL DIAGNOSTIC BILATERAL MAMMOGRAM WITH TOMOSYNTHESIS AND CAD; ULTRASOUND LEFT BREAST LIMITED; ULTRASOUND RIGHT BREAST LIMITED  IMPRESSION: 1. Highly suspicious 0.7 cm UPPER-OUTER RIGHT breast mass with associated suspicious calcifications extending 1.5 cm anteriorly.  Tissue sampling is recommended. 2. No abnormal appearing RIGHT axillary lymph nodes. 3. Benign LOWER LEFT breast cyst corresponding  to the LEFT breast screening study finding.   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  PROGNOSTIC INDICATORS Results: The tumor cells are 2+ for Her2 (EQUIVOCAL). Her2 by FISH will be performed and results reported separately. Estrogen Receptor: 100%, POSITIVE, STRONG STAINING INTENSITY Progesterone Receptor: 70%, POSITIVE, STRONG STAINING INTENSITY Proliferation Marker Ki67: 10%  FLUORESCENCE IN-SITU HYBRIDIZATION Results: GROUP 1: HER2 **POSITIVE**   01/13/2021 Initial Diagnosis   Malignant neoplasm of upper-outer quadrant of right breast in female, estrogen receptor positive ([place])   01/15/2021 Genetic Testing   [facility] CustomNext Panel was Negative. Report date is 01/26/2021.  The CustomNext-Cancer+RNAinsight panel offered by [facility] includes sequencing and rearrangement analysis for the following 47 genes:  APC, ATM, AXIN2, BARD1, BMPR1A, BRCA1, BRCA2, BRIP1, CDH1, CDK4, CDKN2A, CHEK2, CTNNA1, DICER1, EPCAM, GREM1, HOXB13, KIT, MEN1, MLH1, MSH2, MSH3, MSH6, MUTYH, NBN, NF1, NTHL1, PALB2, PDGFRA, PMS2, POLD1, POLE, PTEN, RAD50, RAD51C, RAD51D, SDHA, SDHB, SDHC, SDHD, SMAD4, SMARCA4, STK11, TP53, TSC1, TSC2, and VHL.  RNA data is routinely analyzed for use in variant interpretation for all genes.   01/23/2021 Cancer Staging   Staging form: Breast, AJCC 8th Edition - Pathologic stage from  01/23/2021: Stage IA (pT1c, pN0, cM0, G2, ER+, PR+, HER2+) - Signed by Truitt Merle, MD on 02/13/2021 Histologic grading system: 3 grade system Residual tumor (R): R0 - None    01/23/2021 Definitive Surgery   FINAL MICROSCOPIC DIAGNOSIS:   A. BREAST, LEFT, MASTECTOMY:  - Fibrocystic changes with sclerosing adenosis and calcifications.  - Fibroadenoma (0.6 cm).  - No evidence of malignancy.   B. BREAST, RIGHT, MASTECTOMY:  - Invasive and in situ ductal carcinoma, 1.7 cm.  - Margins negative for carcinoma.  - Biopsy site and biopsy clip.  - See oncology table.   C. LYMPH NODE, RIGHT AXILLARY, SENTINEL, EXCISION:  - One lymph node negative for metastatic carcinoma (0/1).   D. LYMPH NODE, RIGHT AXILLARY, SENTINEL, EXCISION:  - One lymph node negative for metastatic carcinoma (0/1).   E. LYMPH NODE, RIGHT AXILLARY, SENTINEL, EXCISION:  - One lymph node negative for metastatic carcinoma (0/1).   F. LYMPH NODE, RIGHT AXILLARY, SENTINEL, EXCISION:  - One lymph node negative for metastatic carcinoma (0/1).    02/25/2021 -  Chemotherapy   Patient is on Treatment Plan : BREAST Paclitaxel + Trastuzumab q7d / Trastuzumab q21d        INTERVAL HISTORY:  Dana Kramer is here for a follow up of breast cancer. She was last seen by me on 05/06/21. She was seen in the infusion area. She reports the mild neuropathy in her fingers is stable. She notes she is otherwise tolerating treatment well.   All other systems were reviewed with the patient and are negative.  MEDICAL HISTORY:  Past Medical History:  Diagnosis Date   Allergy 11/1998  Penicillin   Chicken pox    History of transfusion of whole blood    Hyperlipidemia    Malignant neoplasm of upper-outer quadrant of right breast in female, estrogen receptor positive ([place]) 01/13/2021   Migraine    Thyroid disease    Hypothyroidism    SURGICAL HISTORY: Past Surgical History:  Procedure Laterality Date   ABDOMINOPLASTY  10/01/2010    CESAREAN SECTION     twice   HERNIA REPAIR     PORTACATH PLACEMENT Right 01/23/2021   Procedure: Right internal jugular 8 [place] PowerPort with C arm and ultrasound guidance;  Surgeon: Erroll Luna, MD;  Location: [facility];  Service: General;  Laterality: Right;   SENTINEL NODE BIOPSY Right 01/23/2021   Procedure: Right axillary sentinel lymph node mapping using Mag Trace;  Surgeon: Erroll Luna, MD;  Location: [facility];  Service: General;  Laterality: Right;   SIMPLE MASTECTOMY WITH AXILLARY SENTINEL NODE BIOPSY Bilateral 01/23/2021   Procedure: Right simple mastectomy ;  Left risk reducing simple mastectomy;  Surgeon: Erroll Luna, MD;  Location: [facility];  Service: General;  Laterality: Bilateral;   TUBAL LIGATION     umblica hernia repair  16/12/9602    I have reviewed the social history and family history with the patient and they are unchanged from previous note.  ALLERGIES:  is allergic to penicillins.  MEDICATIONS:  Current Outpatient Medications  Medication Sig Dispense Refill   amLODipine (NORVASC) 5 MG tablet Take 1 tablet (5 mg total) by mouth daily. 90 tablet 3   b complex vitamins capsule Take 1 capsule by mouth daily.     Calcium Citrate-Vitamin D (CALCIUM + D PO) Take 1 tablet by mouth daily.     Cholecalciferol (VITAMIN D PO) Take 1 tablet by mouth daily.     cyclobenzaprine (FLEXERIL) 5 MG tablet Take 1 tablet (5 mg total) by mouth 3 (three) times daily as needed for muscle spasms. 10 tablet 0   ibuprofen (ADVIL) 800 MG tablet Take 1 tablet (800 mg total) by mouth every 8 (eight) hours as needed. 30 tablet 0   Ibuprofen-diphenhydrAMINE HCl (ADVIL PM) 200-25 MG CAPS Take 2 tablets by mouth daily as needed (pain).     levothyroxine (SYNTHROID) 25 MCG tablet TAKE 1 TABLET(25 MCG) BY MOUTH DAILY BEFORE BREAKFAST 90 tablet 3   lidocaine-prilocaine (EMLA) cream Apply 1 application topically as needed (To be applied to portacath 30 to 60 mins prior to portacath access). 30 g 2    Melatonin 10 MG CAPS Take 10 mg by mouth at bedtime.     metoprolol succinate (TOPROL-XL) 50 MG 24 hr tablet Take 1 tablet (50 mg total) by mouth daily. Take with or immediately following a meal. 90 tablet 3   Multiple Vitamins-Minerals (WOMENS MULTIVITAMIN PO) Take 1 tablet by mouth daily.     ondansetron (ZOFRAN) 8 MG tablet Take 1 tablet (8 mg total) by mouth 2 (two) times daily as needed (Nausea or vomiting). 30 tablet 1   oxyCODONE (OXY IR/ROXICODONE) 5 MG immediate release tablet Take 1 tablet (5 mg total) by mouth every 6 (six) hours as needed for severe pain. 15 tablet 0   pantoprazole (PROTONIX) 40 MG tablet Take 1 tablet (40 mg total) by mouth daily. 30 tablet 0   prochlorperazine (COMPAZINE) 10 MG tablet TAKE 1 TABLET(10 MG) BY MOUTH EVERY 6 HOURS AS NEEDED FOR NAUSEA OR VOMITING 30 tablet 1   simvastatin (ZOCOR) 20 MG tablet TAKE 1 TABLET(20 MG) BY MOUTH AT BEDTIME  90 tablet 3   No current facility-administered medications for this visit.   Facility-Administered Medications Ordered in Other Visits  Medication Dose Route Frequency Provider Last Rate Last Admin   sodium chloride flush (NS) 0.9 % injection 10 mL  10 mL Intracatheter PRN Truitt Merle, MD   10 mL at 05/12/21 1211    PHYSICAL EXAMINATION: ECOG PERFORMANCE STATUS: 1 - Symptomatic but completely ambulatory  Vitals:   05/12/21 0848  BP: (!) 141/92  Pulse: (!) 101  Resp: 18  Temp: 98.4 F (36.9 C)  SpO2: 100%   Wt Readings from Last 3 Encounters:  05/12/21 126 lb 1.6 oz (57.2 kg)  05/06/21 126 lb 6.4 oz (57.3 kg)  04/29/21 129 lb 3 oz (58.6 kg)     GENERAL:alert, no distress and comfortable SKIN: skin color normal, no rashes or significant lesions EYES: normal, Conjunctiva are pink and non-injected, sclera clear  NEURO: alert & oriented x 3 with fluent speech  LABORATORY DATA:  I have reviewed the data as listed CBC Latest Ref Rng & Units 05/12/2021 05/06/2021 04/29/2021  WBC 4.0 - 10.5 K/uL 4.2 4.4 4.2   Hemoglobin 12.0 - 15.0 g/dL 10.7(L) 10.7(L) 10.1(L)  Hematocrit 36.0 - 46.0 % 31.9(L) 31.5(L) 29.3(L)  Platelets 150 - 400 K/uL 241 255 243     CMP Latest Ref Rng & Units 05/12/2021 05/06/2021 04/29/2021  Glucose 70 - 99 mg/dL 138(H) 127(H) 136(H)  BUN 6 - 20 mg/dL 9 7 11   Creatinine 0.44 - 1.00 mg/dL 0.78 0.78 0.75  Sodium 135 - 145 mmol/L 139 139 140  Potassium 3.5 - 5.1 mmol/L 4.0 3.9 3.8  Chloride 98 - 111 mmol/L 108 104 108  CO2 22 - 32 mmol/L 22 26 25   Calcium 8.9 - 10.3 mg/dL 9.6 10.8(H) 8.9  Total Protein 6.5 - 8.1 g/dL 7.1 7.2 6.6  Total Bilirubin 0.3 - 1.2 mg/dL 0.4 0.4 0.3  Alkaline Phos 38 - 126 U/L 47 52 49  AST 15 - 41 U/L 19 20 20   ALT 0 - 44 U/L 9 11 11       RADIOGRAPHIC STUDIES: I have personally reviewed the radiological images as listed and agreed with the findings in the report. No results found.    No orders of the defined types were placed in this encounter.  All questions were answered. The patient knows to call the clinic with any problems, questions or concerns. No barriers to learning was detected.      Truitt Merle, MD 05/12/2021   I, Wilburn Mylar, am acting as scribe for Truitt Merle, MD.   I have reviewed the above documentation for accuracy and completeness, and I agree with the above.

## 2021-05-13 NOTE — Progress Notes (Signed)
Therapist, music at [facility] ?[address], [address] ?[place], [place] [address] ?336 [phone] ?Fax [phone] ? ?Date:  05/15/2021  ? ?Name:  Dana Kramer   DOB:  1965-08-29   MRN:  [id number] ? ?PCP:  Darreld Mclean, MD  ? ? ?Chief Complaint: 6 month follow up (Concerns/ questions: none/) ? ? ?History of Present Illness: ? ?Dana Kramer is a 56 y.o. very pleasant female patient who presents with the following: ? ?Patient seen today for follow-up ?Most recent visit with myself was in January- History of hypothyroidism, hyperlipidemia, anxiety and insomnia ?She was also diagnosed with breast cancer late last year, had a bilateral mastectomy in November and is receiving chemotherapy ?At her last visit her blood pressure was elevated and she was tachycardic, we had increased metoprolol and amlodipine which was helping ? ?She finished chemo- she is doing an none chemotherapy infusion every 3 weeks until December ?No radiation is planned ?She plans to return to work next week- she is eager to get back  ?She is also eager to get back to the gym ? ?Due for Cologuard update; will order for today ?She notes her BP is running 627- 035/ diastolic 80- 00X ?Pulse around 100  at home  ?Pulse Readings from Last 3 Encounters:  ?05/15/21 (!) 101  ?05/12/21 93  ?05/12/21 (!) 101  ? ?BP Readings from Last 3 Encounters:  ?05/15/21 122/80  ?05/12/21 (!) 141/92  ?05/06/21 130/89  ? ?She is using OTC omeprazole -she has tried to stop PPI but notes she has severe GERD.  Advised that she is okay to continue PPI if it is necessary ?Patient Active Problem List  ? Diagnosis Date Noted  ? Port-A-Cath in place 03/04/2021  ? Genetic testing 01/27/2021  ? S/P mastectomy, bilateral 01/23/2021  ? Family history of colon cancer 01/15/2021  ? Malignant neoplasm of upper-outer quadrant of right breast in female, estrogen receptor positive ([place]) 01/13/2021  ? Pre-diabetes 11/06/2020  ? Acquired hypothyroidism 04/14/2018  ? Migraine  01/18/2015  ? Insomnia 11/05/2011  ? Anxiety 11/05/2011  ? Hyperlipidemia 11/05/2011  ? Tachycardia 11/05/2011  ? Umbilical hernia 38/18/2993  ? ? ?Past Medical History:  ?Diagnosis Date  ? Allergy 11/1998  ? Penicillin  ? Chicken pox   ? History of transfusion of whole blood   ? Hyperlipidemia   ? Malignant neoplasm of upper-outer quadrant of right breast in female, estrogen receptor positive ([place]) 01/13/2021  ? Migraine   ? Thyroid disease   ? Hypothyroidism  ? ? ?Past Surgical History:  ?Procedure Laterality Date  ? ABDOMINOPLASTY  10/01/2010  ? CESAREAN SECTION    ? twice  ? HERNIA REPAIR    ? PORTACATH PLACEMENT Right 01/23/2021  ? Procedure: Right internal jugular 8 [place] PowerPort with C arm and ultrasound guidance;  Surgeon: Erroll Luna, MD;  Location: [facility];  Service: General;  Laterality: Right;  ? SENTINEL NODE BIOPSY Right 01/23/2021  ? Procedure: Right axillary sentinel lymph node mapping using Mag Trace;  Surgeon: Erroll Luna, MD;  Location: [facility];  Service: General;  Laterality: Right;  ? SIMPLE MASTECTOMY WITH AXILLARY SENTINEL NODE BIOPSY Bilateral 01/23/2021  ? Procedure: Right simple mastectomy ;  Left risk reducing simple mastectomy;  Surgeon: Erroll Luna, MD;  Location: [facility];  Service: General;  Laterality: Bilateral;  ? TUBAL LIGATION    ? umblica hernia repair  71/69/6789  ? ? ?Social History  ? ?Tobacco Use  ? Smoking status: Never  ?  Smokeless tobacco: Never  ?Vaping Use  ? Vaping Use: Never used  ?Substance Use Topics  ? Alcohol use: Never  ? Drug use: Never  ? ? ?Family History  ?Problem Relation Age of Onset  ? Hypertension Mother   ? Cancer Father 33  ?     esophageal cancer  ? Leukemia Maternal Grandfather   ?     dx. >50  ? Colon cancer Paternal Grandfather   ?     dx. >50  ? Lung cancer Cousin   ?     dx. 75s, did not smoke  ? ? ?Allergies  ?Allergen Reactions  ? Penicillins Rash  ?  Mainly upper body only.  ? ? ?Medication list has been reviewed and updated. ? ?Current  Outpatient Medications on File Prior to Visit  ?Medication Sig Dispense Refill  ? amLODipine (NORVASC) 5 MG tablet Take 1 tablet (5 mg total) by mouth daily. 90 tablet 3  ? b complex vitamins capsule Take 1 capsule by mouth daily.    ? Calcium Citrate-Vitamin D (CALCIUM + D PO) Take 1 tablet by mouth daily.    ? Cholecalciferol (VITAMIN D PO) Take 1 tablet by mouth daily.    ? cyclobenzaprine (FLEXERIL) 5 MG tablet Take 1 tablet (5 mg total) by mouth 3 (three) times daily as needed for muscle spasms. 10 tablet 0  ? ibuprofen (ADVIL) 800 MG tablet Take 1 tablet (800 mg total) by mouth every 8 (eight) hours as needed. 30 tablet 0  ? Ibuprofen-diphenhydrAMINE HCl (ADVIL PM) 200-25 MG CAPS Take 2 tablets by mouth daily as needed (pain).    ? levothyroxine (SYNTHROID) 25 MCG tablet TAKE 1 TABLET(25 MCG) BY MOUTH DAILY BEFORE BREAKFAST 90 tablet 3  ? lidocaine-prilocaine (EMLA) cream Apply 1 application topically as needed (To be applied to portacath 30 to 60 mins prior to portacath access). 30 g 2  ? Melatonin 10 MG CAPS Take 10 mg by mouth at bedtime.    ? metoprolol succinate (TOPROL-XL) 50 MG 24 hr tablet Take 1 tablet (50 mg total) by mouth daily. Take with or immediately following a meal. 90 tablet 3  ? Multiple Vitamins-Minerals (WOMENS MULTIVITAMIN PO) Take 1 tablet by mouth daily.    ? ondansetron (ZOFRAN) 8 MG tablet Take 1 tablet (8 mg total) by mouth 2 (two) times daily as needed (Nausea or vomiting). 30 tablet 1  ? oxyCODONE (OXY IR/ROXICODONE) 5 MG immediate release tablet Take 1 tablet (5 mg total) by mouth every 6 (six) hours as needed for severe pain. 15 tablet 0  ? pantoprazole (PROTONIX) 40 MG tablet Take 1 tablet (40 mg total) by mouth daily. 30 tablet 0  ? prochlorperazine (COMPAZINE) 10 MG tablet TAKE 1 TABLET(10 MG) BY MOUTH EVERY 6 HOURS AS NEEDED FOR NAUSEA OR VOMITING 30 tablet 1  ? simvastatin (ZOCOR) 20 MG tablet TAKE 1 TABLET(20 MG) BY MOUTH AT BEDTIME 90 tablet 3  ? ?No current  facility-administered medications on file prior to visit.  ? ? ?Review of Systems: ? ?As per HPI- otherwise negative. ? ? ?Physical Examination: ?Vitals:  ? 05/15/21 1539  ?BP: 122/80  ?Pulse: (!) 101  ?Resp: 18  ?Temp: 97.8 ?F (36.6 ?C)  ?SpO2: 99%  ? ?Vitals:  ? 05/15/21 1539  ?Weight: 123 lb 12.8 oz (56.2 kg)  ?Height: 5' (1.524 m)  ? ?Body mass index is 24.18 kg/m?. ?Ideal Body Weight: Weight in (lb) to have BMI = 25: 127.7 ? ?GEN: no acute distress.  Looks relatively well, recently completed chemotherapy ?HEENT: Atraumatic, Normocephalic.  ?Ears and Nose: No external deformity. ?CV: RRR, No M/G/R. No JVD. No thrill. No extra heart sounds. ?PULM: CTA B, no wheezes, crackles, rhonchi. No retractions. No resp. distress. No accessory muscle use. ?ABD: S, NT, ND. No rebound. No HSM. ?EXTR: No c/c/e ?PSYCH: Normally interactive. Conversant.  ? ? ?Assessment and Plan: ?Essential hypertension - Plan: TSH ? ?Screening for colon cancer - Plan: Cologuard ? ?Acquired hypothyroidism - Plan: TSH ? ?Dana Kramer is following up today for blood pressure check.  We made an adjustment of her antihypertensives at her last visit.  Her blood pressure is improved, mild tachycardia persists which has been the case since she started chemo.  She just had her last treatment so we are hoping her heart rate will return to normal ? ?Ordered Cologuard ? ?We will check TSH today- follow-up on hypothyroidism ? ?Assuming all is well Dana Kramer plans to see me in about 6 months. ? ? ? ?Signed ?Lamar Blinks, MD ? ?

## 2021-05-15 ENCOUNTER — Ambulatory Visit: Payer: Self-pay | Admitting: Family Medicine

## 2021-05-15 VITALS — BP 122/80 | HR 101 | Temp 97.8°F | Resp 18 | Ht 60.0 in | Wt 123.8 lb

## 2021-05-15 DIAGNOSIS — E039 Hypothyroidism, unspecified: Secondary | ICD-10-CM

## 2021-05-15 DIAGNOSIS — I10 Essential (primary) hypertension: Secondary | ICD-10-CM

## 2021-05-15 DIAGNOSIS — Z1211 Encounter for screening for malignant neoplasm of colon: Secondary | ICD-10-CM

## 2021-05-15 NOTE — Patient Instructions (Addendum)
It was good to see you today- check TSH ?Please see me in about 6 months  ?If the leg pains return when you go back on simvastatin we can try taking it every other day or taking a lower dose  ?

## 2021-05-16 ENCOUNTER — Encounter: Payer: Self-pay | Admitting: Family Medicine

## 2021-05-16 LAB — TSH: TSH: 3.66 u[IU]/mL (ref 0.35–5.50)

## 2021-05-20 ENCOUNTER — Other Ambulatory Visit: Payer: Managed Care, Other (non HMO)

## 2021-05-20 ENCOUNTER — Ambulatory Visit: Payer: Managed Care, Other (non HMO)

## 2021-05-30 ENCOUNTER — Other Ambulatory Visit: Payer: Self-pay | Admitting: Hematology

## 2021-05-30 DIAGNOSIS — Z170 Estrogen receptor positive status [ER+]: Secondary | ICD-10-CM

## 2021-06-02 ENCOUNTER — Telehealth: Payer: Self-pay | Admitting: Nurse Practitioner

## 2021-06-02 NOTE — Telephone Encounter (Signed)
Scheduled appointment per inbasket message. Left message.   ?

## 2021-06-04 ENCOUNTER — Ambulatory Visit: Payer: Managed Care, Other (non HMO)

## 2021-06-06 ENCOUNTER — Encounter: Payer: Self-pay | Admitting: Family Medicine

## 2021-06-06 LAB — COLOGUARD: COLOGUARD: NEGATIVE

## 2021-06-12 ENCOUNTER — Other Ambulatory Visit: Payer: Self-pay | Admitting: Hematology

## 2021-06-12 DIAGNOSIS — Z170 Estrogen receptor positive status [ER+]: Secondary | ICD-10-CM

## 2021-06-16 ENCOUNTER — Other Ambulatory Visit: Payer: Self-pay | Admitting: Hematology

## 2021-06-18 ENCOUNTER — Other Ambulatory Visit: Payer: Self-pay | Admitting: Family Medicine

## 2021-06-18 DIAGNOSIS — R000 Tachycardia, unspecified: Secondary | ICD-10-CM

## 2021-06-20 ENCOUNTER — Inpatient Hospital Stay: Payer: Managed Care, Other (non HMO)

## 2021-06-20 ENCOUNTER — Inpatient Hospital Stay: Payer: Managed Care, Other (non HMO) | Attending: Hematology | Admitting: Hematology

## 2021-06-20 ENCOUNTER — Other Ambulatory Visit: Payer: Self-pay

## 2021-06-20 ENCOUNTER — Encounter: Payer: Self-pay | Admitting: Hematology

## 2021-06-20 VITALS — BP 135/86 | HR 78 | Temp 98.2°F | Resp 18 | Ht 60.0 in | Wt 125.2 lb

## 2021-06-20 DIAGNOSIS — Z170 Estrogen receptor positive status [ER+]: Secondary | ICD-10-CM

## 2021-06-20 DIAGNOSIS — Z5112 Encounter for antineoplastic immunotherapy: Secondary | ICD-10-CM | POA: Diagnosis present

## 2021-06-20 DIAGNOSIS — C50411 Malignant neoplasm of upper-outer quadrant of right female breast: Secondary | ICD-10-CM | POA: Diagnosis not present

## 2021-06-20 DIAGNOSIS — Z95828 Presence of other vascular implants and grafts: Secondary | ICD-10-CM

## 2021-06-20 DIAGNOSIS — Z79899 Other long term (current) drug therapy: Secondary | ICD-10-CM | POA: Insufficient documentation

## 2021-06-20 LAB — CMP (CANCER CENTER ONLY)
ALT: 8 U/L (ref 0–44)
AST: 16 U/L (ref 15–41)
Albumin: 4.1 g/dL (ref 3.5–5.0)
Alkaline Phosphatase: 51 U/L (ref 38–126)
Anion gap: 7 (ref 5–15)
BUN: 12 mg/dL (ref 6–20)
CO2: 26 mmol/L (ref 22–32)
Calcium: 9.2 mg/dL (ref 8.9–10.3)
Chloride: 107 mmol/L (ref 98–111)
Creatinine: 0.8 mg/dL (ref 0.44–1.00)
GFR, Estimated: >60 mL/min (ref >60)
Glucose, Bld: 133 mg/dL — ABNORMAL HIGH (ref 70–99)
Potassium: 3.5 mmol/L (ref 3.5–5.1)
Sodium: 140 mmol/L (ref 135–145)
Total Bilirubin: 0.5 mg/dL (ref 0.3–1.2)
Total Protein: 6.7 g/dL (ref 6.5–8.1)

## 2021-06-20 LAB — CBC WITH DIFFERENTIAL (CANCER CENTER ONLY)
Abs Immature Granulocytes: 0.01 10*3/uL (ref 0.00–0.07)
Basophils Absolute: 0 10*3/uL (ref 0.0–0.1)
Basophils Relative: 0 %
Eosinophils Absolute: 0.2 10*3/uL (ref 0.0–0.5)
Eosinophils Relative: 4 %
HCT: 31.3 % — ABNORMAL LOW (ref 36.0–46.0)
Hemoglobin: 10.5 g/dL — ABNORMAL LOW (ref 12.0–15.0)
Immature Granulocytes: 0 %
Lymphocytes Relative: 47 %
Lymphs Abs: 2.2 10*3/uL (ref 0.7–4.0)
MCH: 29.3 pg (ref 26.0–34.0)
MCHC: 33.5 g/dL (ref 30.0–36.0)
MCV: 87.4 fL (ref 80.0–100.0)
Monocytes Absolute: 0.4 10*3/uL (ref 0.1–1.0)
Monocytes Relative: 8 %
Neutro Abs: 1.9 10*3/uL (ref 1.7–7.7)
Neutrophils Relative %: 41 %
Platelet Count: 217 10*3/uL (ref 150–400)
RBC: 3.58 MIL/uL — ABNORMAL LOW (ref 3.87–5.11)
RDW: 12.8 % (ref 11.5–15.5)
WBC Count: 4.6 10*3/uL (ref 4.0–10.5)
nRBC: 0 % (ref 0.0–0.2)

## 2021-06-20 MED ORDER — SODIUM CHLORIDE 0.9% FLUSH
10.0000 mL | INTRAVENOUS | Status: DC | PRN
  Administered 2021-06-20: 10 mL

## 2021-06-20 MED ORDER — ACETAMINOPHEN 325 MG PO TABS
650.0000 mg | ORAL_TABLET | Freq: Once | ORAL | Status: AC
  Administered 2021-06-20: 650 mg via ORAL
  Filled 2021-06-20: qty 2

## 2021-06-20 MED ORDER — SODIUM CHLORIDE 0.9% FLUSH
10.0000 mL | Freq: Once | INTRAVENOUS | Status: AC
  Administered 2021-06-20: 10 mL

## 2021-06-20 MED ORDER — TAMOXIFEN CITRATE 20 MG PO TABS: 20.0000 mg | ORAL_TABLET | Freq: Every day | ORAL | 3 refills | Status: DC

## 2021-06-20 MED ORDER — DIPHENHYDRAMINE HCL 25 MG PO CAPS
25.0000 mg | ORAL_CAPSULE | Freq: Once | ORAL | Status: AC
  Administered 2021-06-20: 25 mg via ORAL
  Filled 2021-06-20: qty 1

## 2021-06-20 MED ORDER — TRASTUZUMAB-HYALURONIDASE-OYSK 600-10000 MG-UNT/5ML ~~LOC~~ SOLN
600.0000 mg | Freq: Once | SUBCUTANEOUS | Status: AC
  Administered 2021-06-20: 600 mg via SUBCUTANEOUS
  Filled 2021-06-20: qty 5

## 2021-06-20 MED ORDER — HEPARIN SOD (PORK) LOCK FLUSH 100 UNIT/ML IV SOLN
500.0000 [IU] | Freq: Once | INTRAVENOUS | Status: AC | PRN
  Administered 2021-06-20: 500 [IU]

## 2021-06-20 NOTE — Patient Instructions (Signed)
[facility] MEDICAL ONCOLOGY  Discharge Instructions: Thank you for choosing [facility] to provide your oncology and hematology care.   If you have a lab appointment with the Cancer Center, please go directly to the Cancer Center and check in at the registration area.   Wear comfortable clothing and clothing appropriate for easy access to any Portacath or PICC line.   We strive to give you quality time with your provider. You may need to reschedule your appointment if you arrive late (15 or more minutes).  Arriving late affects you and other patients whose appointments are after yours.  Also, if you miss three or more appointments without notifying the office, you may be dismissed from the clinic at the provider's discretion.      For prescription refill requests, have your pharmacy contact our office and allow 72 hours for refills to be completed.    Today you received the following chemotherapy and/or immunotherapy agents: Herceptin Hylecta.     To help prevent nausea and vomiting after your treatment, we encourage you to take your nausea medication as directed.  BELOW ARE SYMPTOMS THAT SHOULD BE REPORTED IMMEDIATELY: . *FEVER GREATER THAN 100.4 F (38 C) OR HIGHER . *CHILLS OR SWEATING . *NAUSEA AND VOMITING THAT IS NOT CONTROLLED WITH YOUR NAUSEA MEDICATION . *UNUSUAL SHORTNESS OF BREATH . *UNUSUAL BRUISING OR BLEEDING . *URINARY PROBLEMS (pain or burning when urinating, or frequent urination) . *BOWEL PROBLEMS (unusual diarrhea, constipation, pain near the anus) . TENDERNESS IN MOUTH AND THROAT WITH OR WITHOUT PRESENCE OF ULCERS (sore throat, sores in mouth, or a toothache) . UNUSUAL RASH, SWELLING OR PAIN  . UNUSUAL VAGINAL DISCHARGE OR ITCHING   Items with * indicate a potential emergency and should be followed up as soon as possible or go to the Emergency Department if any problems should occur.  Please show the CHEMOTHERAPY ALERT CARD or  IMMUNOTHERAPY ALERT CARD at check-in to the Emergency Department and triage nurse.  Should you have questions after your visit or need to cancel or reschedule your appointment, please contact [facility]  Dept: [phone]  and follow the prompts.  Office hours are 8:00 a.m. to 4:30 p.m. Monday - Friday. Please note that voicemails left after 4:00 p.m. may not be returned until the following business day.  We are closed weekends and major holidays. You have access to a nurse at all times for urgent questions. Please call the main number to the clinic Dept: [phone] and follow the prompts.   For any non-urgent questions, you may also contact your provider using MyChart. We now offer e-Visits for anyone 18 and older to request care online for non-urgent symptoms. For details visit [email or web address].   Also download the MyChart app! Go to the app store, search "MyChart", open the app, select [facility], and log in with your MyChart username and password.  Due to Covid, a mask is required upon entering the hospital/clinic. If you do not have a mask, one will be given to you upon arrival. For doctor visits, patients may have 1 support person aged 18 or older with them. For treatment visits, patients cannot have anyone with them due to current Covid guidelines and our immunocompromised population.   

## 2021-06-20 NOTE — Progress Notes (Signed)
?[place]   ?[phone] [phone]   ?Clinic Follow up Note  ? ?Patient Care Team: ?Copland, Gay Filler, MD as PCP - General (Family Medicine) ?Erroll Luna, MD as Consulting Physician (General Surgery) ?Truitt Merle, MD as Consulting Physician (Hematology) ?Eppie Gibson, MD as Attending Physician (Radiation Oncology) ?Mauro Kaufmann, RN as Oncology Nurse Navigator ?[name], RN as Oncology Nurse Navigator ? ?Date of Service:  06/20/2021 ? ?CHIEF COMPLAINT: f/u of right breast cancer ? ?CURRENT THERAPY:  ?Maintenance Herceptin injection q3weeks, starting 06/20/21 ? ?ASSESSMENT & PLAN:  ?Dana Kramer is a 56 y.o. peri-menopausal female with  ? ?1. Malignant neoplasm of upper-outer quadrant of right breast, Stage IA, c(T1bN0M0), Triple positive, Grade 2 ?-found on screening mammogram. 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 ?-S/p b/l mastectomies on 01/23/21 with Dr. Brantley Stage. 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 Port was placed during surgery ?-Due to Her2+ disease, adjuvant chemotherapy was recommended. She began weekly Abraxane/Herceptin x12 weeks on 02/25/21, to be followed by maintenance Herceptin every 3 weeks to total a year.  ?-Baseline EF 60-65% with impaired relaxation (grade I diastolic dysfunction).  Stable on repeat echo 04/23/21. Will monitor q3 months on herceptin ?-she cancelled her first scheduled herceptin due to Covid infection. She will begin today; her insurance has approved injection. Labs reviewed, overall stable and adequate to proceed with herceptin injection. ?--Giving her strongly ER and PR positivity of the tumor cells, and her peri-menopause status, I recommend adjuvant endocrine therapy with tamoxifen for 10 years. The potential side effects, which includes but not limited to, hot flash, skin and vaginal dryness, slightly increased risk of cardiovascular disease and cataract, small risk  of thrombosis and endometrial cancer, were discussed with her in great details. Preventive strategies for thrombosis, such as being physically active, using compression stocks, avoid cigarette smoking, etc., were reviewed with her. I also recommend her to follow-up with her gynecologist once a year, and watch for vaginal spotting or bleeding, as a clinically sign of endometrial cancer, etc. She voiced good understanding, and agrees to proceed. I called in today. ?-we also discussed BSO, but given she is likely close to menopause and her early stage disease, I feel the benefit is small. We will check her hormone levels ?-will switch her to AI when she became postmenopausal  ?-given she is receiving herceptin injections, she is interested in having her port removed. I will refer her back to Dr. Brantley Stage. ?  ?2. Genetic Testing ?-performed on 01/15/21, results were negative. ?  ?  ?PLAN: ?-proceed with maintenance herceptin injection today ?-start tamoxifen, I called in today  ?-repeat echo due next month, I ordered today ?-Herceptin Hylecta every 3 weeks, lab and f/u in 9 weeks  ?-will send message to Dr. Brantley Stage for port removal  ? ? ?No problem-specific Assessment & Plan notes found for this encounter. ? ? ?SUMMARY OF ONCOLOGIC HISTORY: ?Oncology History Overview Note  ? Cancer Staging  ?Malignant neoplasm of upper-outer quadrant of right breast in female, estrogen receptor positive ([place]) ?Staging form: Breast, AJCC 8th Edition ?- Clinical stage from 01/08/2021: Stage IA (cT1b, cN0, cM0, G2, ER+, PR+, HER2+) - Signed by Truitt Merle, MD on 01/15/2021 ?- Pathologic stage from 01/23/2021: Stage IA (pT1c, pN0, cM0, G2, ER+, PR+, HER2+) - Signed by Truitt Merle, MD on 02/13/2021 ? ? ?  ?Malignant neoplasm of upper-outer quadrant of right breast in female, estrogen receptor positive ([place])  ?  01/02/2021 Mammogram  ? EXAM: ?DIGITAL DIAGNOSTIC BILATERAL MAMMOGRAM WITH TOMOSYNTHESIS AND CAD; ?ULTRASOUND LEFT BREAST LIMITED; ULTRASOUND  RIGHT BREAST LIMITED ? ?IMPRESSION: ?1. Highly suspicious 0.7 cm UPPER-OUTER RIGHT breast mass with associated suspicious calcifications extending 1.5 cm anteriorly. Tissue sampling is recommended. ?2. No abnormal appearing RIGHT axillary lymph nodes. ?3. Benign LOWER LEFT breast cyst corresponding to the LEFT breast screening study finding. ?  ?01/08/2021 Cancer Staging  ? Staging form: Breast, AJCC 8th Edition ?- Clinical stage from 01/08/2021: Stage IA (cT1b, cN0, cM0, G2, ER+, PR+, HER2+) - Signed by Truitt Merle, MD on 01/15/2021 ?Stage prefix: Initial diagnosis ?Nuclear grade: G2 ?Histologic grading system: 3 grade system ? ?  ?01/08/2021 Pathology Results  ? Diagnosis ?Breast, right, needle core biopsy, upper outer quadrant, 11 o'clock, 5cmfn, ribbon clip ?- INVASIVE DUCTAL CARCINOMA ?- DUCTAL CARCINOMA IN SITU WITH CALCIFICATIONS ?- SEE COMMENT ?Microscopic Comment ?Based on the biopsy, the carcinoma appears Nottingham grade 2 of 3 and measures 0.6 cm in greatest linear extent. ? ?PROGNOSTIC INDICATORS ?Results: ?The tumor cells are 2+ for Her2 (EQUIVOCAL). Her2 by FISH will be performed and results reported separately. ?Estrogen Receptor: 100%, POSITIVE, STRONG STAINING INTENSITY ?Progesterone Receptor: 70%, POSITIVE, STRONG STAINING INTENSITY ?Proliferation Marker Ki67: 10% ? ?FLUORESCENCE IN-SITU HYBRIDIZATION ?Results: ?GROUP 1: HER2 **POSITIVE** ?  ?01/13/2021 Initial Diagnosis  ? Malignant neoplasm of upper-outer quadrant of right breast in female, estrogen receptor positive ([place]) ? ?  ?01/15/2021 Genetic Testing  ? [facility] CustomNext Panel was Negative. Report date is 01/26/2021. ? ?The CustomNext-Cancer+RNAinsight panel offered by [facility] includes sequencing and rearrangement analysis for the following 47 genes:  APC, ATM, AXIN2, BARD1, BMPR1A, BRCA1, BRCA2, BRIP1, CDH1, CDK4, CDKN2A, CHEK2, CTNNA1, DICER1, EPCAM, GREM1, HOXB13, KIT, MEN1, MLH1, MSH2, MSH3, MSH6, MUTYH, NBN, NF1, NTHL1, PALB2, PDGFRA,  PMS2, POLD1, POLE, PTEN, RAD50, RAD51C, RAD51D, SDHA, SDHB, SDHC, SDHD, SMAD4, SMARCA4, STK11, TP53, TSC1, TSC2, and VHL.  RNA data is routinely analyzed for use in variant interpretation for all genes. ?  ?01/23/2021 Cancer Staging  ? Staging form: Breast, AJCC 8th Edition ?- Pathologic stage from 01/23/2021: Stage IA (pT1c, pN0, cM0, G2, ER+, PR+, HER2+) - Signed by Truitt Merle, MD on 02/13/2021 ?Histologic grading system: 3 grade system ?Residual tumor (R): R0 - None ? ?  ?01/23/2021 Definitive Surgery  ? FINAL MICROSCOPIC DIAGNOSIS:  ? ?A. BREAST, LEFT, MASTECTOMY:  ?- Fibrocystic changes with sclerosing adenosis and calcifications.  ?- Fibroadenoma (0.6 cm).  ?- No evidence of malignancy.  ? ?B. BREAST, RIGHT, MASTECTOMY:  ?- Invasive and in situ ductal carcinoma, 1.7 cm.  ?- Margins negative for carcinoma.  ?- Biopsy site and biopsy clip.  ?- See oncology table.  ? ?C. LYMPH NODE, RIGHT AXILLARY, SENTINEL, EXCISION:  ?- One lymph node negative for metastatic carcinoma (0/1).  ? ?D. LYMPH NODE, RIGHT AXILLARY, SENTINEL, EXCISION:  ?- One lymph node negative for metastatic carcinoma (0/1).  ? ?E. LYMPH NODE, RIGHT AXILLARY, SENTINEL, EXCISION:  ?- One lymph node negative for metastatic carcinoma (0/1).  ? ?F. LYMPH NODE, RIGHT AXILLARY, SENTINEL, EXCISION:  ?- One lymph node negative for metastatic carcinoma (0/1).  ?  ?02/25/2021 -  Chemotherapy  ? Patient is on Treatment Plan : BREAST Paclitaxel + Trastuzumab q7d / Trastuzumab q21d  ? ?  ?  ? ? ? ?INTERVAL HISTORY:  ?Dana Kramer is here for a follow up of breast cancer. She was last seen by me on 05/12/21. She presents to the clinic accompanied by her daughter. ?She  reports she got Covid since her last visit and was treated with Paxlovid. ?She reports she has some swelling to her chest wall. She endorses massaging the area. ?  ?All other systems were reviewed with the patient and are negative. ? ?MEDICAL HISTORY:  ?Past Medical History:  ?Diagnosis Date  ? Allergy  11/1998  ? Penicillin  ? Chicken pox   ? History of transfusion of whole blood   ? Hyperlipidemia   ? Malignant neoplasm of upper-outer quadrant of right breast in female, estrogen receptor positive ([place])

## 2021-06-23 ENCOUNTER — Encounter: Payer: Self-pay | Admitting: *Deleted

## 2021-06-23 ENCOUNTER — Telehealth: Payer: Self-pay | Admitting: Hematology

## 2021-06-23 NOTE — Telephone Encounter (Signed)
Left message with follow-up appointments per 4/14 los. ?

## 2021-06-24 ENCOUNTER — Ambulatory Visit: Payer: Managed Care, Other (non HMO) | Admitting: Nurse Practitioner

## 2021-06-24 ENCOUNTER — Ambulatory Visit: Payer: Managed Care, Other (non HMO)

## 2021-07-01 ENCOUNTER — Encounter: Payer: Self-pay | Admitting: Family Medicine

## 2021-07-01 DIAGNOSIS — F5102 Adjustment insomnia: Secondary | ICD-10-CM

## 2021-07-01 MED ORDER — TRAZODONE HCL 50 MG PO TABS: 25.0000 mg | ORAL_TABLET | Freq: Every evening | ORAL | 3 refills | Status: DC | PRN

## 2021-07-01 NOTE — Addendum Note (Signed)
Addended by: Lamar Blinks C on: 07/01/2021 02:25 PM ? ? Modules accepted: Orders ? ?

## 2021-07-09 ENCOUNTER — Inpatient Hospital Stay: Payer: Managed Care, Other (non HMO) | Attending: Hematology

## 2021-07-09 ENCOUNTER — Other Ambulatory Visit: Payer: Self-pay

## 2021-07-09 VITALS — BP 142/87 | HR 80 | Resp 18

## 2021-07-09 DIAGNOSIS — Z79899 Other long term (current) drug therapy: Secondary | ICD-10-CM | POA: Insufficient documentation

## 2021-07-09 DIAGNOSIS — C50411 Malignant neoplasm of upper-outer quadrant of right female breast: Secondary | ICD-10-CM | POA: Insufficient documentation

## 2021-07-09 DIAGNOSIS — Z79810 Long term (current) use of selective estrogen receptor modulators (SERMs): Secondary | ICD-10-CM | POA: Insufficient documentation

## 2021-07-09 DIAGNOSIS — Z170 Estrogen receptor positive status [ER+]: Secondary | ICD-10-CM | POA: Diagnosis not present

## 2021-07-09 DIAGNOSIS — Z5112 Encounter for antineoplastic immunotherapy: Secondary | ICD-10-CM | POA: Diagnosis present

## 2021-07-09 MED ORDER — TRASTUZUMAB-HYALURONIDASE-OYSK 600-10000 MG-UNT/5ML ~~LOC~~ SOLN
600.0000 mg | Freq: Once | SUBCUTANEOUS | Status: AC
  Administered 2021-07-09: 600 mg via SUBCUTANEOUS
  Filled 2021-07-09: qty 5

## 2021-07-09 MED ORDER — DIPHENHYDRAMINE HCL 25 MG PO CAPS
25.0000 mg | ORAL_CAPSULE | Freq: Once | ORAL | Status: AC
  Administered 2021-07-09: 25 mg via ORAL
  Filled 2021-07-09: qty 1

## 2021-07-09 MED ORDER — ACETAMINOPHEN 325 MG PO TABS
650.0000 mg | ORAL_TABLET | Freq: Once | ORAL | Status: AC
  Administered 2021-07-09: 650 mg via ORAL
  Filled 2021-07-09: qty 2

## 2021-07-09 NOTE — Progress Notes (Signed)
Per Dr. Burr Medico, ok to treat with previous echo report on 04/2021. ?

## 2021-07-15 NOTE — Progress Notes (Signed)
..  Patient is receiving Replacement Medication. ?Medication: Herceptin ?Manufacture: [employer] ?Approval Dates: Approved from 07/10/2021 until indefinitely. ?Reason: self pay ?First DOS: 07/09/2021  ?

## 2021-07-18 ENCOUNTER — Ambulatory Visit (HOSPITAL_COMMUNITY)
Admission: RE | Admit: 2021-07-18 | Discharge: 2021-07-18 | Disposition: A | Discharge status: Home / Self Care | Payer: Managed Care, Other (non HMO) | Source: Ambulatory Visit | Attending: Hematology | Admitting: Hematology

## 2021-07-18 ENCOUNTER — Encounter: Payer: Self-pay | Admitting: Hematology

## 2021-07-18 DIAGNOSIS — Z170 Estrogen receptor positive status [ER+]: Secondary | ICD-10-CM | POA: Diagnosis not present

## 2021-07-18 DIAGNOSIS — I77810 Thoracic aortic ectasia: Secondary | ICD-10-CM

## 2021-07-18 DIAGNOSIS — Z9221 Personal history of antineoplastic chemotherapy: Secondary | ICD-10-CM | POA: Insufficient documentation

## 2021-07-18 DIAGNOSIS — I517 Cardiomegaly: Secondary | ICD-10-CM | POA: Diagnosis not present

## 2021-07-18 DIAGNOSIS — Z0189 Encounter for other specified special examinations: Secondary | ICD-10-CM

## 2021-07-18 DIAGNOSIS — Z01818 Encounter for other preprocedural examination: Secondary | ICD-10-CM | POA: Diagnosis present

## 2021-07-18 DIAGNOSIS — C50411 Malignant neoplasm of upper-outer quadrant of right female breast: Secondary | ICD-10-CM | POA: Insufficient documentation

## 2021-07-18 LAB — ECHOCARDIOGRAM COMPLETE
AR max vel: 2.07 cm2
AV Peak grad: 8.6 mmHg
Ao pk vel: 1.47 m/s
Area-P 1/2: 3.53 cm2
S' Lateral: 3.1 cm

## 2021-07-24 ENCOUNTER — Ambulatory Visit: Payer: Managed Care, Other (non HMO) | Attending: Surgery

## 2021-07-24 VITALS — Wt 121.0 lb

## 2021-07-24 DIAGNOSIS — Z483 Aftercare following surgery for neoplasm: Secondary | ICD-10-CM | POA: Insufficient documentation

## 2021-07-24 NOTE — Therapy (Signed)
  OUTPATIENT PHYSICAL THERAPY SOZO SCREENING NOTE   Patient Name: Dana Kramer MRN: [id number] DOB:[birth year], 56 y.o., female Today's Date: 07/24/2021  PCP: Darreld Mclean, MD REFERRING PROVIDER: Erroll Luna, MD   PT End of Session - 07/24/21 1207     Visit Number 11   # unchanged due to screen only   PT Start Time 1203    PT Stop Time 1210    PT Time Calculation (min) 7 min    Activity Tolerance Patient tolerated treatment well    Behavior During Therapy [facility] for tasks assessed/performed             Past Medical History:  Diagnosis Date   Allergy 11/1998   Penicillin   Chicken pox    History of transfusion of whole blood    Hyperlipidemia    Malignant neoplasm of upper-outer quadrant of right breast in female, estrogen receptor positive ([place]) 01/13/2021   Migraine    Thyroid disease    Hypothyroidism   Past Surgical History:  Procedure Laterality Date   ABDOMINOPLASTY  10/01/2010   CESAREAN SECTION     twice   HERNIA REPAIR     PORTACATH PLACEMENT Right 01/23/2021   Procedure: Right internal jugular 8 [place] PowerPort with C arm and ultrasound guidance;  Surgeon: Erroll Luna, MD;  Location: [facility];  Service: General;  Laterality: Right;   SENTINEL NODE BIOPSY Right 01/23/2021   Procedure: Right axillary sentinel lymph node mapping using Mag Trace;  Surgeon: Erroll Luna, MD;  Location: [facility];  Service: General;  Laterality: Right;   SIMPLE MASTECTOMY WITH AXILLARY SENTINEL NODE BIOPSY Bilateral 01/23/2021   Procedure: Right simple mastectomy ;  Left risk reducing simple mastectomy;  Surgeon: Erroll Luna, MD;  Location: [facility];  Service: General;  Laterality: Bilateral;   TUBAL LIGATION     umblica hernia repair  11/57/2620   Patient Active Problem List   Diagnosis Date Noted   Port-A-Cath in place 03/04/2021   Genetic testing 01/27/2021   S/P mastectomy, bilateral 01/23/2021   Family history of colon cancer 01/15/2021   Malignant neoplasm of  upper-outer quadrant of right breast in female, estrogen receptor positive ([place]) 01/13/2021   Pre-diabetes 11/06/2020   Acquired hypothyroidism 04/14/2018   Migraine 01/18/2015   Insomnia 11/05/2011   Anxiety 11/05/2011   Hyperlipidemia 11/05/2011   Tachycardia 35/59/7416   Umbilical hernia 38/45/3646    REFERRING DIAG: right breast cancer at risk for lymphedema  THERAPY DIAG:  Aftercare following surgery for neoplasm  PERTINENT HISTORY: Patient was diagnosed on 12/10/2020 with right grade II invasive ductal carcinoma breast cancer. She underwent a bilateral mastectomy with a right sentinel node biopsy (4 negative nodes) on 01/23/2021. It is ER/PR positive and HER2 negative with a Ki67 of 10%.   PRECAUTIONS: right UE Lymphedema risk, None  SUBJECTIVE: Pt returns for her 3 month L-Dex screen.   PAIN:  Are you having pain? No  SOZO SCREENING: Patient was assessed today using the SOZO machine to determine the lymphedema index score. This was compared to her baseline score. It was determined that she is within the recommended range when compared to her baseline and no further action is needed at this time. She will continue SOZO screenings. These are done every 3 months for 2 years post operatively followed by every 6 months for 2 years, and then annually.    Otelia Limes, PTA 07/24/2021, 12:15 PM

## 2021-07-30 ENCOUNTER — Inpatient Hospital Stay: Payer: Managed Care, Other (non HMO)

## 2021-07-30 ENCOUNTER — Other Ambulatory Visit: Payer: Self-pay

## 2021-07-30 VITALS — BP 148/88 | HR 77 | Temp 98.0°F | Resp 18 | Wt 121.8 lb

## 2021-07-30 DIAGNOSIS — Z5112 Encounter for antineoplastic immunotherapy: Secondary | ICD-10-CM | POA: Diagnosis not present

## 2021-07-30 DIAGNOSIS — C50411 Malignant neoplasm of upper-outer quadrant of right female breast: Secondary | ICD-10-CM

## 2021-07-30 MED ORDER — TRASTUZUMAB-HYALURONIDASE-OYSK 600-10000 MG-UNT/5ML ~~LOC~~ SOLN
600.0000 mg | Freq: Once | SUBCUTANEOUS | Status: AC
  Administered 2021-07-30: 600 mg via SUBCUTANEOUS
  Filled 2021-07-30: qty 5

## 2021-07-30 MED ORDER — DIPHENHYDRAMINE HCL 25 MG PO CAPS
25.0000 mg | ORAL_CAPSULE | Freq: Once | ORAL | Status: AC
  Administered 2021-07-30: 25 mg via ORAL
  Filled 2021-07-30: qty 1

## 2021-07-30 MED ORDER — ACETAMINOPHEN 325 MG PO TABS
650.0000 mg | ORAL_TABLET | Freq: Once | ORAL | Status: AC
  Administered 2021-07-30: 650 mg via ORAL
  Filled 2021-07-30: qty 2

## 2021-07-30 NOTE — Patient Instructions (Signed)
[facility]ICAL [facility]COL[facility]  Discharge Instructions: Thank you for choosing [facility] to provide your oncology and hematology care.   If you have a lab appointment with the Cancer Center, please go directly to the Cancer Center and check in at the registration area.   Wear comfortable clothing and clothing appropriate for easy access to any Portacath or PICC line.   We strive to give you quality time with your provider. You may need to reschedule your appointment if you arrive late (15 or more minutes).  Arriving late affects you and other patients whose appointments are after yours.  Also, if you miss three or more appointments without notifying the office, you may be dismissed from the clinic at the provider's discretion.      For prescription refill requests, have your pharmacy contact our office and allow 72 hours for refills to be completed.    Today you received the following chemotherapy and/or immunotherapy agents: Herceptin Hylecta.     To help prevent nausea and vomiting after your treatment, we encourage you to take your nausea medication as directed.  BELOW ARE SYMPTOMS THAT SHOULD BE REPORTED IMMEDIATELY: . *FEVER GREATER THAN 100.4 F (38 C) OR HIGHER . *CHILLS OR SWEATING . *NAUSEA AND VOMITING THAT IS NOT CONTROLLED WITH YOUR NAUSEA MEDICATION . *UNUSUAL SHORTNESS OF BREATH . *UNUSUAL BRUISING OR BLEEDING . *URINARY PROBLEMS (pain or burning when urinating, or frequent urination) . *BOWEL PROBLEMS (unusual diarrhea, constipation, pain near the anus) . TENDERNESS IN MOUTH AND THROAT WITH OR WITHOUT PRESENCE OF ULCERS (sore throat, sores in mouth, or a toothache) . UNUSUAL RASH, SWELLING OR PAIN  . UNUSUAL VAGINAL DISCHARGE OR ITCHING   Items with * indicate a potential emergency and should be followed up as soon as possible or go to the Emergency Department if any problems should occur.  Please show the CHEMOTHERAPY ALERT CARD or  IMMUNOTHERAPY ALERT CARD at check-in to the Emergency Department and triage nurse.  Should you have questions after your visit or need to cancel or reschedule your appointment, please contact [facility]  Dept: [phone]  and follow the prompts.  Office hours are 8:00 a.m. to 4:30 p.m. Monday - Friday. Please note that voicemails left after 4:00 p.m. may not be returned until the following business day.  We are closed weekends and major holidays. You have access to a nurse at all times for urgent questions. Please call the main number to the clinic Dept: [phone] and follow the prompts.   For any non-urgent questions, you may also contact your provider using MyChart. We now offer e-Visits for anyone 18 and older to request care online for non-urgent symptoms. For details visit [email or web address].   Also download the MyChart app! Go to the app store, search "MyChart", open the app, select [facility], and log in with your MyChart username and password.  Due to Covid, a mask is required upon entering the hospital/clinic. If you do not have a mask, one will be given to you upon arrival. For doctor visits, patients may have 1 support Amar Keenum aged 18 or older with them. For treatment visits, patients cannot have anyone with them due to current Covid guidelines and our immunocompromised population.   

## 2021-08-18 ENCOUNTER — Encounter: Payer: Self-pay | Admitting: *Deleted

## 2021-08-20 ENCOUNTER — Inpatient Hospital Stay: Payer: Managed Care, Other (non HMO) | Attending: Hematology | Admitting: Hematology

## 2021-08-20 ENCOUNTER — Inpatient Hospital Stay: Payer: Managed Care, Other (non HMO)

## 2021-08-20 ENCOUNTER — Other Ambulatory Visit: Payer: Self-pay

## 2021-08-20 ENCOUNTER — Encounter: Payer: Self-pay | Admitting: Hematology

## 2021-08-20 ENCOUNTER — Inpatient Hospital Stay (HOSPITAL_BASED_OUTPATIENT_CLINIC_OR_DEPARTMENT_OTHER): Payer: Managed Care, Other (non HMO)

## 2021-08-20 VITALS — BP 146/89 | HR 73 | Temp 98.1°F | Resp 18 | Ht 60.0 in | Wt 121.5 lb

## 2021-08-20 DIAGNOSIS — C50411 Malignant neoplasm of upper-outer quadrant of right female breast: Secondary | ICD-10-CM | POA: Diagnosis not present

## 2021-08-20 DIAGNOSIS — Z5112 Encounter for antineoplastic immunotherapy: Secondary | ICD-10-CM | POA: Insufficient documentation

## 2021-08-20 DIAGNOSIS — Z170 Estrogen receptor positive status [ER+]: Secondary | ICD-10-CM

## 2021-08-20 DIAGNOSIS — Z79899 Other long term (current) drug therapy: Secondary | ICD-10-CM | POA: Insufficient documentation

## 2021-08-20 DIAGNOSIS — Z95828 Presence of other vascular implants and grafts: Secondary | ICD-10-CM

## 2021-08-20 LAB — CBC WITH DIFFERENTIAL (CANCER CENTER ONLY)
Abs Immature Granulocytes: 0 10*3/uL (ref 0.00–0.07)
Basophils Absolute: 0 10*3/uL (ref 0.0–0.1)
Basophils Relative: 0 %
Eosinophils Absolute: 0.2 10*3/uL (ref 0.0–0.5)
Eosinophils Relative: 3 %
HCT: 33.7 % — ABNORMAL LOW (ref 36.0–46.0)
Hemoglobin: 11.6 g/dL — ABNORMAL LOW (ref 12.0–15.0)
Immature Granulocytes: 0 %
Lymphocytes Relative: 44 %
Lymphs Abs: 2.2 10*3/uL (ref 0.7–4.0)
MCH: 28.8 pg (ref 26.0–34.0)
MCHC: 34.4 g/dL (ref 30.0–36.0)
MCV: 83.6 fL (ref 80.0–100.0)
Monocytes Absolute: 0.3 10*3/uL (ref 0.1–1.0)
Monocytes Relative: 6 %
Neutro Abs: 2.3 10*3/uL (ref 1.7–7.7)
Neutrophils Relative %: 47 %
Platelet Count: 185 10*3/uL (ref 150–400)
RBC: 4.03 MIL/uL (ref 3.87–5.11)
RDW: 11.7 % (ref 11.5–15.5)
WBC Count: 5 10*3/uL (ref 4.0–10.5)
nRBC: 0 % (ref 0.0–0.2)

## 2021-08-20 LAB — CMP (CANCER CENTER ONLY)
ALT: 6 U/L (ref 0–44)
AST: 17 U/L (ref 15–41)
Albumin: 4.2 g/dL (ref 3.5–5.0)
Alkaline Phosphatase: 55 U/L (ref 38–126)
Anion gap: 5 (ref 5–15)
BUN: 13 mg/dL (ref 6–20)
CO2: 29 mmol/L (ref 22–32)
Calcium: 9.8 mg/dL (ref 8.9–10.3)
Chloride: 106 mmol/L (ref 98–111)
Creatinine: 0.7 mg/dL (ref 0.44–1.00)
GFR, Estimated: >60 mL/min (ref >60)
Glucose, Bld: 125 mg/dL — ABNORMAL HIGH (ref 70–99)
Potassium: 3.9 mmol/L (ref 3.5–5.1)
Sodium: 140 mmol/L (ref 135–145)
Total Bilirubin: 0.3 mg/dL (ref 0.3–1.2)
Total Protein: 7 g/dL (ref 6.5–8.1)

## 2021-08-20 MED ORDER — ACETAMINOPHEN 325 MG PO TABS
650.0000 mg | ORAL_TABLET | Freq: Once | ORAL | Status: AC
  Administered 2021-08-20: 650 mg via ORAL
  Filled 2021-08-20: qty 2

## 2021-08-20 MED ORDER — TRASTUZUMAB-HYALURONIDASE-OYSK 600-10000 MG-UNT/5ML ~~LOC~~ SOLN
600.0000 mg | Freq: Once | SUBCUTANEOUS | Status: AC
  Administered 2021-08-20: 600 mg via SUBCUTANEOUS
  Filled 2021-08-20: qty 5

## 2021-08-20 MED ORDER — DIPHENHYDRAMINE HCL 25 MG PO CAPS
25.0000 mg | ORAL_CAPSULE | Freq: Once | ORAL | Status: AC
  Administered 2021-08-20: 25 mg via ORAL
  Filled 2021-08-20: qty 1

## 2021-08-20 MED ORDER — TAMOXIFEN CITRATE 20 MG PO TABS: 20.0000 mg | ORAL_TABLET | Freq: Every day | ORAL | 1 refills | Status: DC

## 2021-08-20 MED ORDER — SODIUM CHLORIDE 0.9% FLUSH
10.0000 mL | Freq: Once | INTRAVENOUS | Status: AC
  Administered 2021-08-20: 10 mL

## 2021-08-20 NOTE — Patient Instructions (Signed)
[facility]  Discharge Instructions: Thank you for choosing [facility] to provide your oncology and hematology care.   If you have a lab appointment with the Cancer Center, please go directly to the Cancer Center and check in at the registration area.   Wear comfortable clothing and clothing appropriate for easy access to any Portacath or PICC line.   We strive to give you quality time with your provider. You may need to reschedule your appointment if you arrive late (15 or more minutes).  Arriving late affects you and other patients whose appointments are after yours.  Also, if you miss three or more appointments without notifying the office, you may be dismissed from the clinic at the provider's discretion.      For prescription refill requests, have your pharmacy contact our office and allow 72 hours for refills to be completed.    Today you received the following chemotherapy and/or immunotherapy agents: Herceptin Hylecta.     To help prevent nausea and vomiting after your treatment, we encourage you to take your nausea medication as directed.  BELOW ARE SYMPTOMS THAT SHOULD BE REPORTED IMMEDIATELY: . *FEVER GREATER THAN 100.4 F (38 C) OR HIGHER . *CHILLS OR SWEATING . *NAUSEA AND VOMITING THAT IS NOT CONTROLLED WITH YOUR NAUSEA MEDICATION . *UNUSUAL SHORTNESS OF BREATH . *UNUSUAL BRUISING OR BLEEDING . *URINARY PROBLEMS (pain or burning when urinating, or frequent urination) . *BOWEL PROBLEMS (unusual diarrhea, constipation, pain near the anus) . TENDERNESS IN MOUTH AND THROAT WITH OR WITHOUT PRESENCE OF ULCERS (sore throat, sores in mouth, or a toothache) . UNUSUAL RASH, SWELLING OR PAIN  . UNUSUAL VAGINAL DISCHARGE OR ITCHING   Items with * indicate a potential emergency and should be followed up as soon as possible or go to the Emergency Department if any problems should occur.  Please show the CHEMOTHERAPY ALERT CARD or  IMMUNOTHERAPY ALERT CARD at check-in to the Emergency Department and triage nurse.  Should you have questions after your visit or need to cancel or reschedule your appointment, please contact [facility]  Dept: [phone]  and follow the prompts.  Office hours are 8:00 a.m. to 4:30 p.m. Monday - Friday. Please note that voicemails left after 4:00 p.m. may not be returned until the following business day.  We are closed weekends and major holidays. You have access to a nurse at all times for urgent questions. Please call the main number to the clinic Dept: [phone] and follow the prompts.   For any non-urgent questions, you may also contact your provider using MyChart. We now offer e-Visits for anyone 18 and older to request care online for non-urgent symptoms. For details visit [email or web address].   Also download the MyChart app! Go to the app store, search "MyChart", open the app, select [facility], and log in with your MyChart username and password.  Due to Covid, a mask is required upon entering the hospital/clinic. If you do not have a mask, one will be given to you upon arrival. For doctor visits, patients may have 1 support person aged 18 or older with them. For treatment visits, patients cannot have anyone with them due to current Covid guidelines and our immunocompromised population.   

## 2021-08-20 NOTE — Progress Notes (Signed)
[place]   [phone] [phone]   Clinic Follow up Note   Patient Care Team: Copland, Gay Filler, MD as PCP - General (Family Medicine) Erroll Luna, MD as Consulting Physician (General Surgery) Truitt Merle, MD as Consulting Physician (Hematology) Eppie Gibson, MD as Attending Physician (Radiation Oncology) Mauro Kaufmann, RN as Oncology Nurse Navigator [name], RN as Oncology Nurse Navigator  Date of Service:  08/20/2021  CHIEF COMPLAINT: f/u of right breast cancer  CURRENT THERAPY:  -Maintenance Herceptin injection q3weeks, starting 06/20/21 -tamoxifen, started 06/20/21  ASSESSMENT & PLAN:  Dana Kramer is a 56 y.o. peri-menopausal female with   1. Malignant neoplasm of upper-outer quadrant of right breast, Stage IA, c(T1bN0M0), Triple positive, Grade 2 -found on screening mammogram. 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 -S/p b/l mastectomies on 01/23/21 with Dr. Brantley Stage. 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 Port was placed during surgery -Baseline EF 60-65% with impaired relaxation (grade I diastolic dysfunction).  Stable on repeat echo 07/18/21. Will monitor q3 months on herceptin -s/p weekly Abraxane/Herceptin x12 02/25/21 - 05/12/21, -she began maintenance herceptin injection on 06/20/21. She is tolerating well. -she began tamoxifen on 06/20/21. She is tolerating well overall. She notes she has not had a period since completing chemo. We will check her hormone levels in 1-2 years and we can consider switching her to AI when she became postmenopausal. -labs reviewed, overall improved off chemo. Will continue to monitor on herceptin injections.    2. Genetic Testing -performed on 01/15/21, results were negative.     PLAN: -proceed with maintenance herceptin injection today -continue tamoxifen -repeat echo due next month, I ordered today -Herceptin Hylecta every 3 weeks,  lab and f/u in 12 weeks  -Port placement in near future    No problem-specific Assessment & Plan notes found for this encounter.   SUMMARY OF ONCOLOGIC HISTORY: Oncology History Overview Note   Cancer Staging  Malignant neoplasm of upper-outer quadrant of right breast in female, estrogen receptor positive ([place]) Staging form: Breast, AJCC 8th Edition - Clinical stage from 01/08/2021: Stage IA (cT1b, cN0, cM0, G2, ER+, PR+, HER2+) - Signed by Truitt Merle, MD on 01/15/2021 - Pathologic stage from 01/23/2021: Stage IA (pT1c, pN0, cM0, G2, ER+, PR+, HER2+) - Signed by Truitt Merle, MD on 02/13/2021     Malignant neoplasm of upper-outer quadrant of right breast in female, estrogen receptor positive ([place])  01/02/2021 Mammogram   EXAM: DIGITAL DIAGNOSTIC BILATERAL MAMMOGRAM WITH TOMOSYNTHESIS AND CAD; ULTRASOUND LEFT BREAST LIMITED; ULTRASOUND RIGHT BREAST LIMITED  IMPRESSION: 1. Highly suspicious 0.7 cm UPPER-OUTER RIGHT breast mass with associated suspicious calcifications extending 1.5 cm anteriorly. Tissue sampling is recommended. 2. No abnormal appearing RIGHT axillary lymph nodes. 3. Benign LOWER LEFT breast cyst corresponding to the LEFT breast screening study finding.   01/08/2021 Cancer Staging   Staging form: Breast, AJCC 8th Edition - Clinical stage from 01/08/2021: Stage IA (cT1b, cN0, cM0, G2, ER+, PR+, HER2+) - Signed by Truitt Merle, MD on 01/15/2021 Stage prefix: Initial diagnosis Nuclear grade: G2 Histologic grading system: 3 grade system   01/08/2021 Pathology Results   Diagnosis Breast, right, needle core biopsy, upper outer quadrant, 11 o'clock, 5cmfn, ribbon clip - INVASIVE DUCTAL CARCINOMA - DUCTAL CARCINOMA IN SITU WITH CALCIFICATIONS - SEE COMMENT Microscopic Comment Based on the biopsy, the carcinoma appears Nottingham grade 2 of 3 and measures 0.6 cm in greatest linear extent.  PROGNOSTIC INDICATORS Results:  The tumor cells are 2+ for Her2 (EQUIVOCAL). Her2 by FISH  will be performed and results reported separately. Estrogen Receptor: 100%, POSITIVE, STRONG STAINING INTENSITY Progesterone Receptor: 70%, POSITIVE, STRONG STAINING INTENSITY Proliferation Marker Ki67: 10%  FLUORESCENCE IN-SITU HYBRIDIZATION Results: GROUP 1: HER2 **POSITIVE**   01/13/2021 Initial Diagnosis   Malignant neoplasm of upper-outer quadrant of right breast in female, estrogen receptor positive ([place])   01/15/2021 Genetic Testing   [facility] CustomNext Panel was Negative. Report date is 01/26/2021.  The CustomNext-Cancer+RNAinsight panel offered by [facility] includes sequencing and rearrangement analysis for the following 47 genes:  APC, ATM, AXIN2, BARD1, BMPR1A, BRCA1, BRCA2, BRIP1, CDH1, CDK4, CDKN2A, CHEK2, CTNNA1, DICER1, EPCAM, GREM1, HOXB13, KIT, MEN1, MLH1, MSH2, MSH3, MSH6, MUTYH, NBN, NF1, NTHL1, PALB2, PDGFRA, PMS2, POLD1, POLE, PTEN, RAD50, RAD51C, RAD51D, SDHA, SDHB, SDHC, SDHD, SMAD4, SMARCA4, STK11, TP53, TSC1, TSC2, and VHL.  RNA data is routinely analyzed for use in variant interpretation for all genes.   01/23/2021 Cancer Staging   Staging form: Breast, AJCC 8th Edition - Pathologic stage from 01/23/2021: Stage IA (pT1c, pN0, cM0, G2, ER+, PR+, HER2+) - Signed by Truitt Merle, MD on 02/13/2021 Histologic grading system: 3 grade system Residual tumor (R): R0 - None   01/23/2021 Definitive Surgery   FINAL MICROSCOPIC DIAGNOSIS:   A. BREAST, LEFT, MASTECTOMY:  - Fibrocystic changes with sclerosing adenosis and calcifications.  - Fibroadenoma (0.6 cm).  - No evidence of malignancy.   B. BREAST, RIGHT, MASTECTOMY:  - Invasive and in situ ductal carcinoma, 1.7 cm.  - Margins negative for carcinoma.  - Biopsy site and biopsy clip.  - See oncology table.   C. LYMPH NODE, RIGHT AXILLARY, SENTINEL, EXCISION:  - One lymph node negative for metastatic carcinoma (0/1).   D. LYMPH NODE, RIGHT AXILLARY, SENTINEL, EXCISION:  - One lymph node negative for metastatic  carcinoma (0/1).   E. LYMPH NODE, RIGHT AXILLARY, SENTINEL, EXCISION:  - One lymph node negative for metastatic carcinoma (0/1).   F. LYMPH NODE, RIGHT AXILLARY, SENTINEL, EXCISION:  - One lymph node negative for metastatic carcinoma (0/1).    02/25/2021 -  Chemotherapy   Patient is on Treatment Plan : BREAST Paclitaxel + Trastuzumab q7d / Trastuzumab q21d        INTERVAL HISTORY:  Dana Kramer is here for a follow up of breast cancer. She was last seen by me on 06/20/21. She presents to the clinic accompanied by her daughter. She reports she is doing well overall. She notes chronic sleep issues and was recently started on trazadone (recall she was previously on Zoloft, stopped due to possible interaction with tamoxifen). She reports her period has not returned since finishing chemo.    All other systems were reviewed with the patient and are negative.  MEDICAL HISTORY:  Past Medical History:  Diagnosis Date   Allergy 11/1998   Penicillin   Chicken pox    History of transfusion of whole blood    Hyperlipidemia    Malignant neoplasm of upper-outer quadrant of right breast in female, estrogen receptor positive ([place]) 01/13/2021   Migraine    Thyroid disease    Hypothyroidism    SURGICAL HISTORY: Past Surgical History:  Procedure Laterality Date   ABDOMINOPLASTY  10/01/2010   CESAREAN SECTION     twice   HERNIA REPAIR     PORTACATH PLACEMENT Right 01/23/2021   Procedure: Right internal jugular 8 [place] PowerPort with C arm and ultrasound guidance;  Surgeon: Erroll Luna, MD;  Location: [place];  Service: General;  Laterality: Right;   SENTINEL NODE BIOPSY Right 01/23/2021   Procedure: Right axillary sentinel lymph node mapping using Mag Trace;  Surgeon: Erroll Luna, MD;  Location: [place];  Service: General;  Laterality: Right;   SIMPLE MASTECTOMY WITH AXILLARY SENTINEL NODE BIOPSY Bilateral 01/23/2021   Procedure: Right simple mastectomy ;  Left risk reducing simple  mastectomy;  Surgeon: Erroll Luna, MD;  Location: [place];  Service: General;  Laterality: Bilateral;   TUBAL LIGATION     umblica hernia repair  97/04/6376    I have reviewed the social history and family history with the patient and they are unchanged from previous note.  ALLERGIES:  is allergic to penicillins.  MEDICATIONS:  Current Outpatient Medications  Medication Sig Dispense Refill   amLODipine (NORVASC) 5 MG tablet Take 1 tablet (5 mg total) by mouth daily. 90 tablet 3   b complex vitamins capsule Take 1 capsule by mouth daily.     Calcium Citrate-Vitamin D (CALCIUM + D PO) Take 1 tablet by mouth daily.     Cholecalciferol (VITAMIN D PO) Take 1 tablet by mouth daily.     cyclobenzaprine (FLEXERIL) 5 MG tablet Take 1 tablet (5 mg total) by mouth 3 (three) times daily as needed for muscle spasms. 10 tablet 0   ibuprofen (ADVIL) 800 MG tablet Take 1 tablet (800 mg total) by mouth every 8 (eight) hours as needed. 30 tablet 0   Ibuprofen-diphenhydrAMINE HCl (ADVIL PM) 200-25 MG CAPS Take 2 tablets by mouth daily as needed (pain).     levothyroxine (SYNTHROID) 25 MCG tablet TAKE 1 TABLET(25 MCG) BY MOUTH DAILY BEFORE BREAKFAST 90 tablet 3   lidocaine-prilocaine (EMLA) cream Apply 1 application topically as needed (To be applied to portacath 30 to 60 mins prior to portacath access). 30 g 2   Melatonin 10 MG CAPS Take 10 mg by mouth at bedtime.     metoprolol succinate (TOPROL-XL) 50 MG 24 hr tablet Take 1 tablet (50 mg total) by mouth daily. Take with or immediately following a meal. 90 tablet 3   Multiple Vitamins-Minerals (WOMENS MULTIVITAMIN PO) Take 1 tablet by mouth daily.     ondansetron (ZOFRAN) 8 MG tablet Take 1 tablet (8 mg total) by mouth 2 (two) times daily as needed (Nausea or vomiting). 30 tablet 1   oxyCODONE (OXY IR/ROXICODONE) 5 MG immediate release tablet Take 1 tablet (5 mg total) by mouth every 6 (six) hours as needed for severe pain. 15 tablet 0   pantoprazole  (PROTONIX) 40 MG tablet Take 1 tablet (40 mg total) by mouth daily. 30 tablet 0   prochlorperazine (COMPAZINE) 10 MG tablet TAKE 1 TABLET(10 MG) BY MOUTH EVERY 6 HOURS AS NEEDED FOR NAUSEA OR VOMITING 30 tablet 1   simvastatin (ZOCOR) 20 MG tablet TAKE 1 TABLET(20 MG) BY MOUTH AT BEDTIME 90 tablet 3   tamoxifen (NOLVADEX) 20 MG tablet Take 1 tablet (20 mg total) by mouth daily. 90 tablet 1   traZODone (DESYREL) 50 MG tablet Take 0.5-1 tablets (25-50 mg total) by mouth at bedtime as needed for sleep. 30 tablet 3   No current facility-administered medications for this visit.    PHYSICAL EXAMINATION: ECOG PERFORMANCE STATUS: 0 - Asymptomatic  Vitals:   08/20/21 1137  BP: (!) 146/89  Pulse: 73  Resp: 18  Temp: 98.1 F (36.7 C)  SpO2: 99%   Wt Readings from Last 3 Encounters:  08/20/21 121 lb 8 oz (55.1 kg)  07/30/21 121 lb  12 oz (55.2 kg)  07/24/21 121 lb (54.9 kg)     GENERAL:alert, no distress and comfortable SKIN: skin color, texture, turgor are normal, no rashes or significant lesions EYES: normal, Conjunctiva are pink and non-injected, sclera clear  Musculoskeletal:no cyanosis of digits and no clubbing  NEURO: alert & oriented x 3 with fluent speech, no focal motor/sensory deficits BREAST: No palpable mass, nodules or adenopathy bilaterally. Breast exam benign.   LABORATORY DATA:  I have reviewed the data as listed    Latest Ref Rng & Units 08/20/2021   10:49 AM 06/20/2021    8:13 AM 05/12/2021    8:39 AM  CBC  WBC 4.0 - 10.5 K/uL 5.0  4.6  4.2   Hemoglobin 12.0 - 15.0 g/dL 11.6  10.5  10.7   Hematocrit 36.0 - 46.0 % 33.7  31.3  31.9   Platelets 150 - 400 K/uL 185  217  241         Latest Ref Rng & Units 08/20/2021   10:49 AM 06/20/2021    8:13 AM 05/12/2021    8:39 AM  CMP  Glucose 70 - 99 mg/dL 125  133  138   BUN 6 - 20 mg/dL _0 Creatinine 0.44 - 1.00 mg/dL 0.70  0.80  0.78   Sodium 135 - 145 mmol/L 140  140  139   Potassium 3.5 - 5.1 mmol/L 3.9  3.5   4.0   Chloride 98 - 111 mmol/L 106  107  108   CO2 22 - 32 mmol/L _1 Calcium 8.9 - 10.3 mg/dL 9.8  9.2  9.6   Total Protein 6.5 - 8.1 g/dL 7.0  6.7  7.1   Total Bilirubin 0.3 - 1.2 mg/dL 0.3  0.5  0.4   Alkaline Phos 38 - 126 U/L 55  51  47   AST 15 - 41 U/L _2 ALT 0 - 44 U/L _3 RADIOGRAPHIC STUDIES: I have personally reviewed the radiological images as listed and agreed with the findings in the report. No results found.    Orders Placed This Encounter  Procedures   ECHOCARDIOGRAM COMPLETE    Standing Status:   Future    Standing Expiration Date:   08/21/2022    Order Specific Question:   Where should this test be performed    Answer:   [facility]    Order Specific Question:   Perflutren DEFINITY (image enhancing agent) should be administered unless hypersensitivity or allergy exist    Answer:   Administer Perflutren    Order Specific Question:   Is a special reader required? (athlete or structural heart)    Answer:   No    Order Specific Question:   Does this study need to be read by the Structural team/Level 3 readers?    Answer:   No    Order Specific Question:   Reason for exam-Echo    Answer:   Chemo  Z09   All questions were answered. The patient knows to call the clinic with any problems, questions or concerns. No barriers to learning was detected. The total time spent in the appointment was 30 minutes.     Truitt Merle, MD 08/20/2021   I, Wilburn Mylar, am acting as scribe for Truitt Merle, MD.   I have reviewed the above documentation for accuracy and completeness, and  I agree with the above.

## 2021-08-28 ENCOUNTER — Encounter: Payer: Self-pay | Admitting: *Deleted

## 2021-09-10 ENCOUNTER — Inpatient Hospital Stay: Payer: Managed Care, Other (non HMO) | Attending: Hematology

## 2021-09-10 VITALS — BP 143/91 | HR 80 | Temp 98.3°F | Resp 17 | Wt 119.2 lb

## 2021-09-10 DIAGNOSIS — Z170 Estrogen receptor positive status [ER+]: Secondary | ICD-10-CM | POA: Insufficient documentation

## 2021-09-10 DIAGNOSIS — Z79899 Other long term (current) drug therapy: Secondary | ICD-10-CM | POA: Insufficient documentation

## 2021-09-10 DIAGNOSIS — Z5112 Encounter for antineoplastic immunotherapy: Secondary | ICD-10-CM | POA: Insufficient documentation

## 2021-09-10 DIAGNOSIS — C50411 Malignant neoplasm of upper-outer quadrant of right female breast: Secondary | ICD-10-CM | POA: Diagnosis not present

## 2021-09-10 MED ORDER — ACETAMINOPHEN 325 MG PO TABS
650.0000 mg | ORAL_TABLET | Freq: Once | ORAL | Status: AC
  Administered 2021-09-10: 650 mg via ORAL
  Filled 2021-09-10: qty 2

## 2021-09-10 MED ORDER — TRASTUZUMAB-HYALURONIDASE-OYSK 600-10000 MG-UNT/5ML ~~LOC~~ SOLN
600.0000 mg | Freq: Once | SUBCUTANEOUS | Status: AC
  Administered 2021-09-10: 600 mg via SUBCUTANEOUS
  Filled 2021-09-10: qty 5

## 2021-09-10 MED ORDER — DIPHENHYDRAMINE HCL 25 MG PO CAPS
25.0000 mg | ORAL_CAPSULE | Freq: Once | ORAL | Status: AC
  Administered 2021-09-10: 25 mg via ORAL
  Filled 2021-09-10: qty 1

## 2021-09-10 NOTE — Patient Instructions (Signed)
[facility] MEDICAL [facility]COLOGY  Discharge Instructions: Thank you for choosing [facility] to provide your oncology and hematology care.   If you have a lab appointment with the Cancer Center, please go directly to the Cancer Center and check in at the registration area.   Wear comfortable clothing and clothing appropriate for easy access to any Portacath or PICC line.   We strive to give you quality time with your provider. You may need to reschedule your appointment if you arrive late (15 or more minutes).  Arriving late affects you and other patients whose appointments are after yours.  Also, if you miss three or more appointments without notifying the office, you may be dismissed from the clinic at the provider's discretion.      For prescription refill requests, have your pharmacy contact our office and allow 72 hours for refills to be completed.    Today you received the following chemotherapy and/or immunotherapy agents: Herceptin Hylecta.     To help prevent nausea and vomiting after your treatment, we encourage you to take your nausea medication as directed.  BELOW ARE SYMPTOMS THAT SHOULD BE REPORTED IMMEDIATELY: . *FEVER GREATER THAN 100.4 F (38 C) OR HIGHER . *CHILLS OR SWEATING . *NAUSEA AND VOMITING THAT IS NOT CONTROLLED WITH YOUR NAUSEA MEDICATION . *UNUSUAL SHORTNESS OF BREATH . *UNUSUAL BRUISING OR BLEEDING . *URINARY PROBLEMS (pain or burning when urinating, or frequent urination) . *BOWEL PROBLEMS (unusual diarrhea, constipation, pain near the anus) . TENDERNESS IN MOUTH AND THROAT WITH OR WITHOUT PRESENCE OF ULCERS (sore throat, sores in mouth, or a toothache) . UNUSUAL RASH, SWELLING OR PAIN  . UNUSUAL VAGINAL DISCHARGE OR ITCHING   Items with * indicate a potential emergency and should be followed up as soon as possible or go to the Emergency Department if any problems should occur.  Please show the CHEMOTHERAPY ALERT CARD or  IMMUNOTHERAPY ALERT CARD at check-in to the Emergency Department and triage nurse.  Should you have questions after your visit or need to cancel or reschedule your appointment, please contact [facility]  Dept: [phone]  and follow the prompts.  Office hours are 8:00 a.m. to 4:30 p.m. Monday - Friday. Please note that voicemails left after 4:00 p.m. may not be returned until the following business day.  We are closed weekends and major holidays. You have access to a nurse at all times for urgent questions. Please call the main number to the clinic Dept: [phone] and follow the prompts.   For any non-urgent questions, you may also contact your provider using MyChart. We now offer e-Visits for anyone 18 and older to request care online for non-urgent symptoms. For details visit [email or web address].   Also download the MyChart app! Go to the app store, search "MyChart", open the app, select [facility], and log in with your MyChart username and password.  Due to Covid, a mask is required upon entering the hospital/clinic. If you do not have a mask, one will be given to you upon arrival. For doctor visits, patients may have 1 support person aged 18 or older with them. For treatment visits, patients cannot have anyone with them due to current Covid guidelines and our immunocompromised population.   

## 2021-09-15 ENCOUNTER — Other Ambulatory Visit: Payer: Self-pay | Admitting: Family Medicine

## 2021-09-15 DIAGNOSIS — E785 Hyperlipidemia, unspecified: Secondary | ICD-10-CM

## 2021-09-29 ENCOUNTER — Other Ambulatory Visit: Payer: Self-pay

## 2021-10-01 ENCOUNTER — Inpatient Hospital Stay: Payer: Managed Care, Other (non HMO)

## 2021-10-01 ENCOUNTER — Other Ambulatory Visit: Payer: Self-pay

## 2021-10-01 VITALS — BP 133/87 | HR 88 | Temp 98.0°F | Resp 17 | Ht 60.0 in | Wt 118.1 lb

## 2021-10-01 DIAGNOSIS — Z5112 Encounter for antineoplastic immunotherapy: Secondary | ICD-10-CM | POA: Diagnosis not present

## 2021-10-01 DIAGNOSIS — Z170 Estrogen receptor positive status [ER+]: Secondary | ICD-10-CM

## 2021-10-01 MED ORDER — TRASTUZUMAB-HYALURONIDASE-OYSK 600-10000 MG-UNT/5ML ~~LOC~~ SOLN
600.0000 mg | Freq: Once | SUBCUTANEOUS | Status: AC
  Administered 2021-10-01: 600 mg via SUBCUTANEOUS
  Filled 2021-10-01: qty 5

## 2021-10-01 MED ORDER — DIPHENHYDRAMINE HCL 25 MG PO CAPS
25.0000 mg | ORAL_CAPSULE | Freq: Once | ORAL | Status: AC
  Administered 2021-10-01: 25 mg via ORAL
  Filled 2021-10-01: qty 1

## 2021-10-01 MED ORDER — ACETAMINOPHEN 325 MG PO TABS
650.0000 mg | ORAL_TABLET | Freq: Once | ORAL | Status: AC
  Administered 2021-10-01: 650 mg via ORAL
  Filled 2021-10-01: qty 2

## 2021-10-08 ENCOUNTER — Encounter: Payer: Self-pay | Admitting: Hematology

## 2021-10-16 ENCOUNTER — Other Ambulatory Visit: Payer: Self-pay | Admitting: Hematology

## 2021-10-21 ENCOUNTER — Ambulatory Visit (HOSPITAL_COMMUNITY)
Admission: RE | Admit: 2021-10-21 | Discharge: 2021-10-21 | Disposition: A | Discharge status: Home / Self Care | Payer: Managed Care, Other (non HMO) | Source: Ambulatory Visit | Attending: Hematology | Admitting: Hematology

## 2021-10-21 DIAGNOSIS — Z01810 Encounter for preprocedural cardiovascular examination: Secondary | ICD-10-CM | POA: Insufficient documentation

## 2021-10-21 DIAGNOSIS — Z9221 Personal history of antineoplastic chemotherapy: Secondary | ICD-10-CM | POA: Diagnosis not present

## 2021-10-21 DIAGNOSIS — I517 Cardiomegaly: Secondary | ICD-10-CM | POA: Insufficient documentation

## 2021-10-21 DIAGNOSIS — Z0189 Encounter for other specified special examinations: Secondary | ICD-10-CM

## 2021-10-21 DIAGNOSIS — C50411 Malignant neoplasm of upper-outer quadrant of right female breast: Secondary | ICD-10-CM | POA: Insufficient documentation

## 2021-10-21 DIAGNOSIS — I358 Other nonrheumatic aortic valve disorders: Secondary | ICD-10-CM | POA: Insufficient documentation

## 2021-10-21 DIAGNOSIS — E785 Hyperlipidemia, unspecified: Secondary | ICD-10-CM | POA: Insufficient documentation

## 2021-10-21 DIAGNOSIS — Z170 Estrogen receptor positive status [ER+]: Secondary | ICD-10-CM | POA: Insufficient documentation

## 2021-10-21 LAB — ECHOCARDIOGRAM COMPLETE
AR max vel: 2.69 cm2
AV Peak grad: 5.4 mmHg
Ao pk vel: 1.17 m/s
Area-P 1/2: 4.29 cm2
S' Lateral: 3.3 cm

## 2021-10-22 ENCOUNTER — Other Ambulatory Visit: Payer: Self-pay

## 2021-10-22 ENCOUNTER — Inpatient Hospital Stay: Payer: Managed Care, Other (non HMO) | Attending: Hematology

## 2021-10-22 VITALS — BP 151/95 | HR 93 | Temp 98.0°F | Resp 16 | Wt 117.8 lb

## 2021-10-22 DIAGNOSIS — Z5112 Encounter for antineoplastic immunotherapy: Secondary | ICD-10-CM | POA: Diagnosis present

## 2021-10-22 DIAGNOSIS — Z79810 Long term (current) use of selective estrogen receptor modulators (SERMs): Secondary | ICD-10-CM | POA: Insufficient documentation

## 2021-10-22 DIAGNOSIS — C50411 Malignant neoplasm of upper-outer quadrant of right female breast: Secondary | ICD-10-CM | POA: Diagnosis not present

## 2021-10-22 DIAGNOSIS — Z170 Estrogen receptor positive status [ER+]: Secondary | ICD-10-CM | POA: Insufficient documentation

## 2021-10-22 MED ORDER — ACETAMINOPHEN 325 MG PO TABS
650.0000 mg | ORAL_TABLET | Freq: Once | ORAL | Status: AC
  Administered 2021-10-22: 650 mg via ORAL

## 2021-10-22 MED ORDER — DIPHENHYDRAMINE HCL 25 MG PO CAPS
25.0000 mg | ORAL_CAPSULE | Freq: Once | ORAL | Status: AC
  Administered 2021-10-22: 25 mg via ORAL

## 2021-10-22 MED ORDER — TRASTUZUMAB-HYALURONIDASE-OYSK 600-10000 MG-UNT/5ML ~~LOC~~ SOLN
600.0000 mg | Freq: Once | SUBCUTANEOUS | Status: AC
  Administered 2021-10-22: 600 mg via SUBCUTANEOUS
  Filled 2021-10-22: qty 5

## 2021-10-22 NOTE — Progress Notes (Signed)
Per Dr. Burr Medico OK to proceed with ECHO 10/21/21 left EF 50-55%. Dr. Burr Medico recommends Pt follow up with cardiologist.  Pt verbalized understanding.

## 2021-10-22 NOTE — Patient Instructions (Signed)
[facility] ONCOLOGY  Discharge Instructions: Thank you for choosing [facility] to provide your oncology and hematology care.   If you have a lab appointment with the [name], please go directly to the [facility] and check in at the registration area.   Wear comfortable clothing and clothing appropriate for easy access to any Portacath or PICC line.   We strive to give you quality time with your provider. You may need to reschedule your appointment if you arrive late (15 or more minutes).  Arriving late affects you and other patients whose appointments are after yours.  Also, if you miss three or more appointments without notifying the office, you may be dismissed from the clinic at the provider's discretion.      For prescription refill requests, have your pharmacy contact our office and allow 72 hours for refills to be completed.    Today you received the following chemotherapy and/or immunotherapy agents: Hercepin Hylecta      To help prevent nausea and vomiting after your treatment, we encourage you to take your nausea medication as directed.  BELOW ARE SYMPTOMS THAT SHOULD BE REPORTED IMMEDIATELY: *FEVER GREATER THAN 100.4 F (38 C) OR HIGHER *CHILLS OR SWEATING *NAUSEA AND VOMITING THAT IS NOT CONTROLLED WITH YOUR NAUSEA MEDICATION *UNUSUAL SHORTNESS OF BREATH *UNUSUAL BRUISING OR BLEEDING *URINARY PROBLEMS (pain or burning when urinating, or frequent urination) *BOWEL PROBLEMS (unusual diarrhea, constipation, pain near the anus) TENDERNESS IN MOUTH AND THROAT WITH OR WITHOUT PRESENCE OF ULCERS (sore throat, sores in mouth, or a toothache) UNUSUAL RASH, SWELLING OR PAIN  UNUSUAL VAGINAL DISCHARGE OR ITCHING   Items with * indicate a potential emergency and should be followed up as soon as possible or go to the Emergency Department if any problems should occur.  Please show the CHEMOTHERAPY ALERT CARD or IMMUNOTHERAPY ALERT CARD at  check-in to the Emergency Department and triage nurse.  Should you have questions after your visit or need to cancel or reschedule your appointment, please contact [facility]  Dept: [phone]  and follow the prompts.  Office hours are 8:00 a.m. to 4:30 p.m. Monday - Friday. Please note that voicemails left after 4:00 p.m. may not be returned until the following business day.  We are closed weekends and major holidays. You have access to a nurse at all times for urgent questions. Please call the main number to the clinic Dept: [phone] and follow the prompts.   For any non-urgent questions, you may also contact your provider using MyChart. We now offer e-Visits for anyone 69 and older to request care online for non-urgent symptoms. For details visit [email or web address].   Also download the MyChart app! Go to the app store, search "MyChart", open the app, select [facility], and log in with your MyChart username and password.  Masks are optional in the cancer centers. If you would like for your care team to wear a mask while they are taking care of you, please let them know. You may have one support person who is at least 54 years old accompany you for your appointments.

## 2021-10-22 NOTE — Progress Notes (Signed)
Per Dr. Ernestina Penna verbal orders, referred pt to Dr. Glori Bickers w/[facility] Team.  Pt's recent ECHO shows significant decrease in lft injection fraction.

## 2021-10-23 ENCOUNTER — Ambulatory Visit: Payer: Managed Care, Other (non HMO) | Attending: Surgery | Admitting: Rehabilitation

## 2021-10-23 ENCOUNTER — Other Ambulatory Visit: Payer: Self-pay | Admitting: *Deleted

## 2021-10-23 DIAGNOSIS — C50411 Malignant neoplasm of upper-outer quadrant of right female breast: Secondary | ICD-10-CM

## 2021-10-23 DIAGNOSIS — Z483 Aftercare following surgery for neoplasm: Secondary | ICD-10-CM | POA: Insufficient documentation

## 2021-10-23 DIAGNOSIS — Z170 Estrogen receptor positive status [ER+]: Secondary | ICD-10-CM | POA: Insufficient documentation

## 2021-10-23 NOTE — Therapy (Signed)
OUTPATIENT PHYSICAL THERAPY SOZO SCREENING NOTE   Patient Name: Dana Kramer MRN: [id number] DOB:27-Nov-1965, 56 y.o., female Today's Date: 10/23/2021  PCP: Darreld Mclean, MD REFERRING PROVIDER: Erroll Luna, MD   PT End of Session - 10/23/21 1150     Visit Number 11   screen only   PT Start Time 0177    PT Stop Time 1151    PT Time Calculation (min) 3 min    Activity Tolerance Patient tolerated treatment well    Behavior During Therapy [facility] for tasks assessed/performed             Past Medical History:  Diagnosis Date   Allergy 11/1998   Penicillin   Chicken pox    History of transfusion of whole blood    Hyperlipidemia    Malignant neoplasm of upper-outer quadrant of right breast in female, estrogen receptor positive ([place]) 01/13/2021   Migraine    Thyroid disease    Hypothyroidism   Past Surgical History:  Procedure Laterality Date   ABDOMINOPLASTY  10/01/2010   CESAREAN SECTION     twice   HERNIA REPAIR     PORTACATH PLACEMENT Right 01/23/2021   Procedure: Right internal jugular 8 [place] PowerPort with C arm and ultrasound guidance;  Surgeon: Erroll Luna, MD;  Location: [place];  Service: General;  Laterality: Right;   SENTINEL NODE BIOPSY Right 01/23/2021   Procedure: Right axillary sentinel lymph node mapping using Mag Trace;  Surgeon: Erroll Luna, MD;  Location: [place];  Service: General;  Laterality: Right;   SIMPLE MASTECTOMY WITH AXILLARY SENTINEL NODE BIOPSY Bilateral 01/23/2021   Procedure: Right simple mastectomy ;  Left risk reducing simple mastectomy;  Surgeon: Erroll Luna, MD;  Location: [facility];  Service: General;  Laterality: Bilateral;   TUBAL LIGATION     umblica hernia repair  93/90/3009   Patient Active Problem List   Diagnosis Date Noted   Port-A-Cath in place 03/04/2021   Genetic testing 01/27/2021   S/P mastectomy, bilateral 01/23/2021   Family history of colon cancer 01/15/2021   Malignant neoplasm of upper-outer quadrant of  right breast in female, estrogen receptor positive ([place]) 01/13/2021   Pre-diabetes 11/06/2020   Acquired hypothyroidism 04/14/2018   Migraine 01/18/2015   Insomnia 11/05/2011   Anxiety 11/05/2011   Hyperlipidemia 11/05/2011   Tachycardia 23/30/0762   Umbilical hernia 26/33/3545    REFERRING DIAG: right breast cancer at risk for lymphedema  THERAPY DIAG:  Aftercare following surgery for neoplasm  Malignant neoplasm of upper-outer quadrant of right breast in female, estrogen receptor positive ([place])  PERTINENT HISTORY: Patient was diagnosed on 12/10/2020 with right grade II invasive ductal carcinoma breast cancer. She underwent a bilateral mastectomy with a right sentinel node biopsy (4 negative nodes) on 01/23/2021. It is ER/PR positive and HER2 negative with a Ki67 of 10%.  Completed adjuvant chemotherapy and is on immunotherapy.    PRECAUTIONS: right UE Lymphedema risk  SUBJECTIVE: I think my left chest is having trouble  PAIN:  Are you having pain? No  SOZO SCREENING: Patient was assessed today using the SOZO machine to determine the lymphedema index score. This was compared to her baseline score. It was determined that she is within the recommended range when compared to her baseline and no further action is needed at this time. She will continue SOZO screenings. These are done every 3 months for 2 years post operatively followed by every 6 months for 2 years, and then annually.  Pt reported increased swelling and  decreasing AROM on the left side (opposite side) still from post surgery.  Dr. Brantley Stage had mentioned she should do PT for it but did not have a new referral over.  Leone Payor requested new referral from [facility].   Stark Bray, PT 10/23/2021, 11:52 AM

## 2021-10-24 ENCOUNTER — Other Ambulatory Visit: Payer: Self-pay

## 2021-10-27 ENCOUNTER — Encounter: Payer: Self-pay | Admitting: Hematology

## 2021-10-30 ENCOUNTER — Other Ambulatory Visit: Payer: Self-pay

## 2021-10-30 ENCOUNTER — Encounter: Payer: Self-pay | Admitting: Rehabilitation

## 2021-10-30 ENCOUNTER — Ambulatory Visit: Payer: Managed Care, Other (non HMO) | Attending: Hematology | Admitting: Rehabilitation

## 2021-10-30 DIAGNOSIS — R600 Localized edema: Secondary | ICD-10-CM | POA: Diagnosis present

## 2021-10-30 DIAGNOSIS — M25611 Stiffness of right shoulder, not elsewhere classified: Secondary | ICD-10-CM | POA: Insufficient documentation

## 2021-10-30 DIAGNOSIS — C50411 Malignant neoplasm of upper-outer quadrant of right female breast: Secondary | ICD-10-CM | POA: Insufficient documentation

## 2021-10-30 DIAGNOSIS — Z170 Estrogen receptor positive status [ER+]: Secondary | ICD-10-CM | POA: Diagnosis present

## 2021-10-30 DIAGNOSIS — M25612 Stiffness of left shoulder, not elsewhere classified: Secondary | ICD-10-CM | POA: Diagnosis present

## 2021-10-30 DIAGNOSIS — Z483 Aftercare following surgery for neoplasm: Secondary | ICD-10-CM | POA: Diagnosis present

## 2021-10-30 DIAGNOSIS — R293 Abnormal posture: Secondary | ICD-10-CM | POA: Diagnosis present

## 2021-10-30 NOTE — Therapy (Signed)
OUTPATIENT PHYSICAL THERAPY ONCOLOGY EVALUATION  Patient Name: Dana Kramer MRN: [id number] DOB:[birth year], 56 y.o., female Today's Date: 10/30/2021   PT End of Session - 10/30/21 1203     Visit Number 12    Number of Visits 18    Date for PT Re-Evaluation 05/07/21    PT Start Time 1203    PT Stop Time 1250    PT Time Calculation (min) 47 min    Activity Tolerance Patient tolerated treatment well    Behavior During Therapy [facility] for tasks assessed/performed             Past Medical History:  Diagnosis Date   Allergy 11/1998   Penicillin   Chicken pox    History of transfusion of whole blood    Hyperlipidemia    Malignant neoplasm of upper-outer quadrant of right breast in female, estrogen receptor positive ([place]) 01/13/2021   Migraine    Thyroid disease    Hypothyroidism   Past Surgical History:  Procedure Laterality Date   ABDOMINOPLASTY  10/01/2010   CESAREAN SECTION     twice   HERNIA REPAIR     PORTACATH PLACEMENT Right 01/23/2021   Procedure: Right internal jugular 8 [place] PowerPort with C arm and ultrasound guidance;  Surgeon: Erroll Luna, MD;  Location: [place];  Service: General;  Laterality: Right;   SENTINEL NODE BIOPSY Right 01/23/2021   Procedure: Right axillary sentinel lymph node mapping using Mag Trace;  Surgeon: Erroll Luna, MD;  Location: [place];  Service: General;  Laterality: Right;   SIMPLE MASTECTOMY WITH AXILLARY SENTINEL NODE BIOPSY Bilateral 01/23/2021   Procedure: Right simple mastectomy ;  Left risk reducing simple mastectomy;  Surgeon: Erroll Luna, MD;  Location: [place] [facility];  Service: General;  Laterality: Bilateral;   TUBAL LIGATION     umblica hernia repair  92/95/7473   Patient Active Problem List   Diagnosis Date Noted   Port-A-Cath in place 03/04/2021   Genetic testing 01/27/2021   S/P mastectomy, bilateral 01/23/2021   Family history of colon cancer 01/15/2021   Malignant neoplasm of upper-outer quadrant of right breast in  female, estrogen receptor positive ([place]) 01/13/2021   Pre-diabetes 11/06/2020   Acquired hypothyroidism 04/14/2018   Migraine 01/18/2015   Insomnia 11/05/2011   Anxiety 11/05/2011   Hyperlipidemia 11/05/2011   Tachycardia 40/37/0964   Umbilical hernia 38/38/1840    PCP: Dr. Lamar Blinks  REFERRING PROVIDER: Dr. Truitt Merle  REFERRING DIAG: C50.411,Z17.0 (ICD-10-CM) - Malignant neoplasm of upper-outer quadrant of right breast in female, estrogen receptor positive ([place]  THERAPY DIAG:  Aftercare following surgery for neoplasm  Malignant neoplasm of upper-outer quadrant of right breast in female, estrogen receptor positive ([place])  Abnormal posture  Stiffness of right shoulder, not elsewhere classified  Stiffness of left shoulder, not elsewhere classified  Localized edema  ONSET DATE: 01/23/21  Rationale for Evaluation and Treatment Rehabilitation  SUBJECTIVE  SUBJECTIVE STATEMENT: Just swelling on the left side and tightness.  I go to the gym every day    PERTINENT HISTORY:  Patient was diagnosed on 12/10/2020 with right grade II invasive ductal carcinoma breast cancer. She underwent a bilateral mastectomy with a right sentinel node biopsy (4 negative nodes) on 01/23/2021. It is ER/PR positive and HER2 negative with a Ki67 of 10%.  Currently on herceptin q3weeks and tamoxifen.   PAIN:  Are you having pain? Not at rest -  NPRS scale: 4/10 Pain location: the whole left chest Pain orientation: Left  PAIN TYPE: sharp and tight Pain description: intermittent  Aggravating factors: reaching  Relieving factors: rest   PRECAUTIONS: Rt lymphedema risk  WEIGHT BEARING RESTRICTIONS No  FALLS:  Has patient fallen in last 6 months? No  LIVING ENVIRONMENT: Lives with: lives with their family and  lives with their spouse Lives in: House/apartment  OCCUPATION: RN   LEISURE: walks and Metallurgist  HAND DOMINANCE : right   PRIOR LEVEL OF FUNCTION: Independent  PATIENT GOALS what can I do the swelling?   OBJECTIVE PALPATION: Soft edema under left mastectomy incision - tightness evident in pectoralis and lateral trunk - see photo  OBSERVATIONS / OTHER ASSESSMENTS: see photo  POSTURE: rounded shoulders   UPPER EXTREMITY AROM/PROM:  A/PROM RIGHT   03/26/21  10/30/21  Shoulder extension    Shoulder flexion 153 pull   Shoulder abduction 160 pull   Shoulder internal rotation    Shoulder external rotation          (Blank rows = not tested)  A/PROM LEFT   03/26/21 10/30/21  Shoulder extension    Shoulder flexion 145 pull 150 - pectoralis tightness   Shoulder abduction 170 pull 165 - pectoralis tightness  Shoulder internal rotation    Shoulder external rotation      (Blank rows = not tested)  TODAY'S TREATMENT  Date: 10/30/21 In supine: short neck, left axillary nodes, left inguinal nodes, left interaxillary pathway and then work on chest towards axilla and pathway Gentle MFR to pectoralis with prolonged hold with opposing hands into various directions.  STM pectoralis with cocoa butter.  Made gray foam rectangle for bra   PATIENT EDUCATION:  Education details: POC, how scarring can inhibit lymphatic flow Person educated: Patient Education method: Explanation, Demonstration, and Verbal cues Education comprehension: verbalized understanding  HOME EXERCISE PROGRAM:   ASSESSMENT:  CLINICAL IMPRESSION: Patient is a 56 y.o. female who was seen today for physical therapy evaluation and treatment for for continued left chest tightness and edema.  This is the non cancer side so pt has an intact lymphatic system but blocks from incision and tightness. Started MLD, MFR, and STM .   OBJECTIVE IMPAIRMENTS decreased activity tolerance, decreased ROM, and increased edema.    ACTIVITY LIMITATIONS lifting and reach over head  PARTICIPATION LIMITATIONS: none  PERSONAL FACTORS none are also affecting patient's functional outcome.   REHAB POTENTIAL: Excellent  CLINICAL DECISION MAKING: Stable/uncomplicated  EVALUATION COMPLEXITY: Low  GOALS: Goals reviewed with patient? Yes  LONG TERM GOALS: Target date: 12/11/2021    Pt will be ind with final HEP including stretching and MLD Baseline: Goal status: INITIAL  2.  Pt will report decreased left chest discomfort by at least 50% Baseline:  Goal status: INITIAL   PLAN: PT FREQUENCY: 1x/week  PT DURATION: 6 weeks  PLANNED INTERVENTIONS: Therapeutic exercises, Patient/Family education, Self Care, Manual lymph drainage, scar mobilization, Taping, Manual therapy, and Re-evaluation  PLAN FOR NEXT SESSION:  Left UE MLD for edema inferior to incision, Lt incision and chest mobility - has no LN removed on left but does on Rt    Nyah Shepherd, Adrian Prince, PT 10/30/2021, 12:57 PM

## 2021-11-03 ENCOUNTER — Encounter (HOSPITAL_COMMUNITY): Payer: Self-pay | Admitting: Cardiology

## 2021-11-03 ENCOUNTER — Encounter: Payer: Self-pay | Admitting: Hematology

## 2021-11-03 ENCOUNTER — Other Ambulatory Visit (HOSPITAL_COMMUNITY): Payer: Self-pay

## 2021-11-03 ENCOUNTER — Ambulatory Visit (HOSPITAL_COMMUNITY)
Admission: RE | Admit: 2021-11-03 | Discharge: 2021-11-03 | Disposition: A | Discharge status: Home / Self Care | Payer: Managed Care, Other (non HMO) | Source: Ambulatory Visit | Attending: Cardiology | Admitting: Cardiology

## 2021-11-03 ENCOUNTER — Other Ambulatory Visit (HOSPITAL_COMMUNITY): Payer: Self-pay | Admitting: Adult Health

## 2021-11-03 VITALS — BP 140/80 | HR 78 | Wt 117.0 lb

## 2021-11-03 DIAGNOSIS — I427 Cardiomyopathy due to drug and external agent: Secondary | ICD-10-CM

## 2021-11-03 DIAGNOSIS — Z7901 Long term (current) use of anticoagulants: Secondary | ICD-10-CM | POA: Diagnosis not present

## 2021-11-03 DIAGNOSIS — Z170 Estrogen receptor positive status [ER+]: Secondary | ICD-10-CM | POA: Diagnosis not present

## 2021-11-03 DIAGNOSIS — T451X5A Adverse effect of antineoplastic and immunosuppressive drugs, initial encounter: Secondary | ICD-10-CM | POA: Diagnosis not present

## 2021-11-03 DIAGNOSIS — Z79899 Other long term (current) drug therapy: Secondary | ICD-10-CM | POA: Insufficient documentation

## 2021-11-03 DIAGNOSIS — I10 Essential (primary) hypertension: Secondary | ICD-10-CM

## 2021-11-03 DIAGNOSIS — C50411 Malignant neoplasm of upper-outer quadrant of right female breast: Secondary | ICD-10-CM

## 2021-11-03 LAB — BASIC METABOLIC PANEL
Anion gap: 7 (ref 5–15)
BUN: 13 mg/dL (ref 6–20)
CO2: 26 mmol/L (ref 22–32)
Calcium: 9.1 mg/dL (ref 8.9–10.3)
Chloride: 107 mmol/L (ref 98–111)
Creatinine, Ser: 0.71 mg/dL (ref 0.44–1.00)
GFR, Estimated: >60 mL/min (ref >60)
Glucose, Bld: 102 mg/dL — ABNORMAL HIGH (ref 70–99)
Potassium: 3.9 mmol/L (ref 3.5–5.1)
Sodium: 140 mmol/L (ref 135–145)

## 2021-11-03 MED ORDER — ENTRESTO 24-26 MG PO TABS: 1.0000 | ORAL_TABLET | Freq: Two times a day (BID) | ORAL | 3 refills | Status: DC

## 2021-11-03 NOTE — Progress Notes (Signed)
ADVANCED HF CLINIC CONSULT NOTE  Referring Physician: Dr Burr Medico Primary Care:Dr Copland  Primary HF Cardiologist: Dr Aundra Dubin   HPI: [name] is a 56 year old referred to the Cardio-Oncology clinic by Dr Burr Medico for newly reduced EF.   [name] is a 56 year old with a history of breast cancer, s/p bilateral mastectomies 01/2021 , and HTN.   R breast Cancer  -Stage IA (pT1c, pN0, cM0, G2, ER+, PR+, HER2+).  Paclitaxel + Trastuzumab q7d / Trastuzumab q21d . Maintenance Herceptin injection q3weeks, starting 06/20/21. Plan to continue Herceptin for total 12 months.   Echo 04/2021 EF 60-65%  Echo 07/2021 EF 60-65% 23.2%  Echo 10/2021 EF 50-55% GLS 22.3 %  Overall feeling fine. Denies SOB/PND/Orthopnea. Appetite ok. No fever or chills. Weight at home 115 pounds.Exercising almost 2 hours per day. Working full time as a Marine scientist at [employer]. Taking all medications.   Review of Systems: [y] = yes, [ ]  = no   General: Weight gain [ ] ; Weight loss [ ] ; Anorexia [ ] ; Fatigue [ ] ; Fever [ ] ; Chills [ ] ; Weakness [ ]   Cardiac: Chest pain/pressure [ ] ; Resting SOB [ ] ; Exertional SOB [ ] ; Orthopnea [ ] ; Pedal Edema [ ] ; Palpitations [ ] ; Syncope [ ] ; Presyncope [ ] ; Paroxysmal nocturnal dyspnea[ ]   Pulmonary: Cough [ ] ; Wheezing[ ] ; Hemoptysis[ ] ; Sputum [ ] ; Snoring [ ]   GI: Vomiting[ ] ; Dysphagia[ ] ; Melena[ ] ; Hematochezia [ ] ; Heartburn[ ] ; Abdominal pain [ ] ; Constipation [ ] ; Diarrhea [ ] ; BRBPR [ ]   GU: Hematuria[ ] ; Dysuria [ ] ; Nocturia[ ]   Vascular: Pain in legs with walking [ ] ; Pain in feet with lying flat [ ] ; Non-healing sores [ ] ; Stroke [ ] ; TIA [ ] ; Slurred speech [ ] ;  Neuro: Headaches[ ] ; Vertigo[ ] ; Seizures[ ] ; Paresthesias[ ] ;Blurred vision [ ] ; Diplopia [ ] ; Vision changes [ ]   Ortho/Skin: Arthritis [ ] ; Joint pain [ ] ; Muscle pain [ ] ; Joint swelling [ ] ; Back Pain [ ] ; Rash [ ]   Psych: Depression[ ] ; Anxiety[ ]   Heme: Bleeding problems [ ] ; Clotting disorders [ ] ; Anemia [ ]   Endocrine:  Diabetes [ ] ; Thyroid dysfunction[ ]    Past Medical History:  Diagnosis Date   Allergy 11/1998   Penicillin   Chicken pox    History of transfusion of whole blood    Hyperlipidemia    Malignant neoplasm of upper-outer quadrant of right breast in female, estrogen receptor positive ([place]) 01/13/2021   Migraine    Thyroid disease    Hypothyroidism    Current Outpatient Medications  Medication Sig Dispense Refill   amLODipine (NORVASC) 5 MG tablet Take 1 tablet (5 mg total) by mouth daily. 90 tablet 3   b complex vitamins capsule Take 1 capsule by mouth daily.     Calcium Citrate-Vitamin D (CALCIUM + D PO) Take 1 tablet by mouth daily.     Cholecalciferol (VITAMIN D PO) Take 1 tablet by mouth daily.     levothyroxine (SYNTHROID) 25 MCG tablet TAKE 1 TABLET(25 MCG) BY MOUTH DAILY BEFORE BREAKFAST 90 tablet 3   Melatonin 10 MG CAPS Take 10 mg by mouth at bedtime.     metoprolol succinate (TOPROL-XL) 50 MG 24 hr tablet Take 1 tablet (50 mg total) by mouth daily. Take with or immediately following a meal. 90 tablet 3   Multiple Vitamins-Minerals (WOMENS MULTIVITAMIN PO) Take 1 tablet by mouth daily.     simvastatin (ZOCOR) 20  MG tablet TAKE 1 TABLET(20 MG) BY MOUTH AT BEDTIME 90 tablet 3   tamoxifen (NOLVADEX) 20 MG tablet TAKE 1 TABLET(20 MG) BY MOUTH DAILY 30 tablet 3   traZODone (DESYREL) 50 MG tablet Take 0.5-1 tablets (25-50 mg total) by mouth at bedtime as needed for sleep. 30 tablet 3   No current facility-administered medications for this encounter.    Allergies  Allergen Reactions   Betadine [Povidone Iodine] Rash   Penicillins Rash    Mainly upper body only.      Social History   Socioeconomic History   Marital status: Married    Spouse name: Not on file   Number of children: 2   Years of education: Not on file   Highest education level: Not on file  Occupational History   Not on file  Tobacco Use   Smoking status: Never   Smokeless tobacco: Never  Vaping Use    Vaping Use: Never used  Substance and Sexual Activity   Alcohol use: Never   Drug use: Never   Sexual activity: Not Currently  Other Topics Concern   Not on file  Social History Narrative   Not on file   Social Determinants of Health   Financial Resource Strain: Not on file  Food Insecurity: Not on file  Transportation Needs: Not on file  Physical Activity: Not on file  Stress: Not on file  Social Connections: Not on file  Intimate Partner Violence: Not on file      Family History  Problem Relation Age of Onset   Hypertension Mother    Cancer Father 21       esophageal cancer   Leukemia Maternal Grandfather        dx. >50   Colon cancer Paternal Grandfather        dx. >50   Lung cancer Cousin        dx. 71s, did not smoke    Vitals:   11/03/21 1120  BP: (!) 140/80  Pulse: 78  SpO2: 98%  Weight: 53.1 kg (117 lb)    PHYSICAL EXAM: General:  Well appearing. No respiratory difficulty. Walked in the clinic.  HEENT: normal Neck: supple. no JVD. Carotids 2+ bilat; no bruits. No lymphadenopathy or thryomegaly appreciated. Cor: PMI nondisplaced. Regular rate & rhythm. No rubs, gallops or murmurs. Lungs: clear Abdomen: soft, nontender, nondistended. No hepatosplenomegaly. No bruits or masses. Good bowel sounds. Extremities: no cyanosis, clubbing, rash, edema Neuro: alert & oriented x 3, cranial nerves grossly intact. moves all 4 extremities w/o difficulty. Affect pleasant.  ECG: SR 77 bpm    ASSESSMENT & PLAN:  1. Breast Cancer, R - Explained incidence of Herceptin cardiotoxicity and role of Cardio-oncology clinic at length. Echo images reviewed personally by Dr. Aundra Dubin.  - Started herceptin 02/2021. Most recent Echo EF and GLS trending down. 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 Plan to repeat Echo in 6 weeks.  Continue Toprol XL 50 mg daily.  - Add entresto 24-26 mg twice a day  - check BMET today and in 7 days.   2. HTN   She has been on Amlodipine and Toprol XL for sometime but has had ongoing issues with elvated BP.   - As above start entresto 24-26 mg twice a day    Follow up in 6 weeks with an ECHO and Dr Aundra Dubin.     Meryl Ponder NP-C  2:36 PM

## 2021-11-03 NOTE — Patient Instructions (Signed)
START Entresto 24/26 mg Twice daily   Labs done today, your results will be available in MyChart, we will contact you for abnormal readings.  Your physician recommends that you return for lab work in: 1 week, we have provided you a prescription to have this done locally  Your physician recommends that you schedule a follow-up appointment in: 6 weeks with an echocardiogram  If you have any questions or concerns before your next appointment please send [place] a message through [place] or call our office at [phone].    TO LEAVE A MESSAGE FOR THE NURSE SELECT OPTION 2, PLEASE LEAVE A MESSAGE INCLUDING: YOUR NAME DATE OF BIRTH CALL BACK NUMBER REASON FOR CALL**this is important as we prioritize the call backs  YOU WILL RECEIVE A CALL BACK THE SAME DAY AS LONG AS YOU CALL BEFORE 4:00 PM  At the [facility], you and your health needs are our priority. As part of our continuing mission to provide you with exceptional heart care, we have created designated Provider Care Teams. These Care Teams include your primary Cardiologist (physician) and Advanced Practice Providers (APPs- Physician Assistants and Nurse Practitioners) who all work together to provide you with the care you need, when you need it.   You may see any of the following providers on your designated Care Team at your next follow up: Dr Glori Bickers Dr [name] Dr. Roxana Hires, NP Lyda Jester, [place] [facility] [place], [place] Forestine Na, NP Audry Riles, PharmD   Please be sure to bring in all your medications bottles to every appointment.

## 2021-11-04 ENCOUNTER — Other Ambulatory Visit: Payer: Self-pay | Admitting: Family Medicine

## 2021-11-04 DIAGNOSIS — F5102 Adjustment insomnia: Secondary | ICD-10-CM

## 2021-11-06 ENCOUNTER — Ambulatory Visit: Payer: Managed Care, Other (non HMO) | Admitting: Rehabilitation

## 2021-11-06 ENCOUNTER — Encounter: Payer: Self-pay | Admitting: Rehabilitation

## 2021-11-06 DIAGNOSIS — Z483 Aftercare following surgery for neoplasm: Secondary | ICD-10-CM | POA: Diagnosis not present

## 2021-11-06 DIAGNOSIS — M25612 Stiffness of left shoulder, not elsewhere classified: Secondary | ICD-10-CM

## 2021-11-06 DIAGNOSIS — Z170 Estrogen receptor positive status [ER+]: Secondary | ICD-10-CM

## 2021-11-06 DIAGNOSIS — M25611 Stiffness of right shoulder, not elsewhere classified: Secondary | ICD-10-CM

## 2021-11-06 DIAGNOSIS — R600 Localized edema: Secondary | ICD-10-CM

## 2021-11-06 DIAGNOSIS — R293 Abnormal posture: Secondary | ICD-10-CM

## 2021-11-06 NOTE — Therapy (Signed)
OUTPATIENT PHYSICAL THERAPY ONCOLOGY TREATMENT  Patient Name: Dana Kramer MRN: [id number] DOB:04-08-1965, 56 y.o., female Today's Date: 11/06/2021   PT End of Session - 11/06/21 1203     Visit Number 13    Number of Visits 18    PT Start Time 1203    PT Stop Time 1250    PT Time Calculation (min) 47 min    Activity Tolerance Patient tolerated treatment well    Behavior During Therapy [facility] for tasks assessed/performed             Past Medical History:  Diagnosis Date   Allergy 11/1998   Penicillin   Chicken pox    History of transfusion of whole blood    Hyperlipidemia    Malignant neoplasm of upper-outer quadrant of right breast in female, estrogen receptor positive ([place]) 01/13/2021   Migraine    Thyroid disease    Hypothyroidism   Past Surgical History:  Procedure Laterality Date   ABDOMINOPLASTY  10/01/2010   CESAREAN SECTION     twice   HERNIA REPAIR     PORTACATH PLACEMENT Right 01/23/2021   Procedure: Right internal jugular 8 [place] PowerPort with C arm and ultrasound guidance;  Surgeon: Erroll Luna, MD;  Location: [place];  Service: General;  Laterality: Right;   SENTINEL NODE BIOPSY Right 01/23/2021   Procedure: Right axillary sentinel lymph node mapping using Mag Trace;  Surgeon: Erroll Luna, MD;  Location: [place];  Service: General;  Laterality: Right;   SIMPLE MASTECTOMY WITH AXILLARY SENTINEL NODE BIOPSY Bilateral 01/23/2021   Procedure: Right simple mastectomy ;  Left risk reducing simple mastectomy;  Surgeon: Erroll Luna, MD;  Location: [place] [facility];  Service: General;  Laterality: Bilateral;   TUBAL LIGATION     umblica hernia repair  38/18/2993   Patient Active Problem List   Diagnosis Date Noted   Port-A-Cath in place 03/04/2021   Genetic testing 01/27/2021   S/P mastectomy, bilateral 01/23/2021   Family history of colon cancer 01/15/2021   Malignant neoplasm of upper-outer quadrant of right breast in female, estrogen receptor positive ([place])  01/13/2021   Pre-diabetes 11/06/2020   Acquired hypothyroidism 04/14/2018   Migraine 01/18/2015   Insomnia 11/05/2011   Anxiety 11/05/2011   Hyperlipidemia 11/05/2011   Tachycardia 71/69/6789   Umbilical hernia 38/12/1749    PCP: Dr. Lamar Blinks  REFERRING PROVIDER: Dr. Truitt Merle  REFERRING DIAG: C50.411,Z17.0 (ICD-10-CM) - Malignant neoplasm of upper-outer quadrant of right breast in female, estrogen receptor positive ([place]  THERAPY DIAG:  Aftercare following surgery for neoplasm  Malignant neoplasm of upper-outer quadrant of right breast in female, estrogen receptor positive ([place])  Abnormal posture  Stiffness of right shoulder, not elsewhere classified  Stiffness of left shoulder, not elsewhere classified  Localized edema  ONSET DATE: 01/23/21  Rationale for Evaluation and Treatment Rehabilitation  SUBJECTIVE  SUBJECTIVE STATEMENT: I think the swelling is moving a bit  PERTINENT HISTORY:  Patient was diagnosed on 12/10/2020 with right grade II invasive ductal carcinoma breast cancer. She underwent a bilateral mastectomy with a right sentinel node biopsy (4 negative nodes) on 01/23/2021. It is ER/PR positive and HER2 negative with a Ki67 of 10%.  Currently on herceptin q3weeks and tamoxifen.   PAIN:  Are you having pain? Not at rest -  NPRS scale: 4/10 Pain location: the whole left chest Pain orientation: Left  PAIN TYPE: sharp and tight Pain description: intermittent  Aggravating factors: reaching  Relieving factors: rest   PRECAUTIONS: Rt lymphedema risk  WEIGHT BEARING RESTRICTIONS No  FALLS:  Has patient fallen in last 6 months? No  LIVING ENVIRONMENT: Lives with: lives with their family and lives with their spouse Lives in: House/apartment  OCCUPATION: RN    LEISURE: walks and Metallurgist  HAND DOMINANCE : right   PRIOR LEVEL OF FUNCTION: Independent  PATIENT GOALS what can I do the swelling?   OBJECTIVE PALPATION: Soft edema under left mastectomy incision - tightness evident in pectoralis and lateral trunk - see photo  OBSERVATIONS / OTHER ASSESSMENTS: see photo  POSTURE: rounded shoulders   UPPER EXTREMITY AROM/PROM:  A/PROM RIGHT   03/26/21  10/30/21  Shoulder extension    Shoulder flexion 153 pull   Shoulder abduction 160 pull   Shoulder internal rotation    Shoulder external rotation          (Blank rows = not tested)  A/PROM LEFT   03/26/21 10/30/21  Shoulder extension    Shoulder flexion 145 pull 150 - pectoralis tightness   Shoulder abduction 170 pull 165 - pectoralis tightness  Shoulder internal rotation    Shoulder external rotation      (Blank rows = not tested)  TODAY'S TREATMENT  Date: 11/06/21 In supine: short neck, left axillary nodes, left inguinal nodes, left interaxillary pathway and then work on chest towards axilla and pathway Gentle MFR to pectoralis with prolonged hold with opposing hands into various directions.  STM pectoralis with cocoa butter.  Made gray foam rectangle for bra   Date: 10/30/21 In supine: short neck, left axillary nodes, left inguinal nodes, left interaxillary pathway and then work on chest towards axilla and pathway Gentle MFR to pectoralis with prolonged hold with opposing hands into various directions.  STM pectoralis with cocoa butter.  Made gray foam rectangle for bra   PATIENT EDUCATION:  Education details: POC, how scarring can inhibit lymphatic flow Person educated: Patient Education method: Explanation, Demonstration, and Verbal cues Education comprehension: verbalized understanding  HOME EXERCISE PROGRAM:   ASSESSMENT:  CLINICAL IMPRESSION: Edema seems more lateral today and not as pocketed under the incision.    OBJECTIVE IMPAIRMENTS decreased activity  tolerance, decreased ROM, and increased edema.   ACTIVITY LIMITATIONS lifting and reach over head  PARTICIPATION LIMITATIONS: none  PERSONAL FACTORS none are also affecting patient's functional outcome.   REHAB POTENTIAL: Excellent  CLINICAL DECISION MAKING: Stable/uncomplicated  EVALUATION COMPLEXITY: Low  GOALS: Goals reviewed with patient? Yes  LONG TERM GOALS: Target date: 12/11/2021    Pt will be ind with final HEP including stretching and MLD Baseline: Goal status: INITIAL  2.  Pt will report decreased left chest discomfort by at least 50% Baseline:  Goal status: INITIAL   PLAN: PT FREQUENCY: 1x/week  PT DURATION: 6 weeks  PLANNED INTERVENTIONS: Therapeutic exercises, Patient/Family education, Self Care, Manual lymph drainage, scar mobilization, Taping, Manual therapy, and Re-evaluation  PLAN FOR NEXT SESSION: Left UE MLD for edema inferior to incision, Lt incision and chest mobility - has no LN removed on left but does on Rt    Daton Szilagyi, Adrian Prince, PT 11/06/2021, 12:53 PM

## 2021-11-10 ENCOUNTER — Other Ambulatory Visit: Payer: Self-pay | Admitting: Hematology

## 2021-11-10 DIAGNOSIS — Z170 Estrogen receptor positive status [ER+]: Secondary | ICD-10-CM

## 2021-11-11 ENCOUNTER — Encounter: Payer: Self-pay | Admitting: Rehabilitation

## 2021-11-11 ENCOUNTER — Ambulatory Visit: Payer: Managed Care, Other (non HMO) | Attending: Hematology | Admitting: Rehabilitation

## 2021-11-11 ENCOUNTER — Other Ambulatory Visit: Payer: Self-pay

## 2021-11-11 DIAGNOSIS — M25612 Stiffness of left shoulder, not elsewhere classified: Secondary | ICD-10-CM | POA: Insufficient documentation

## 2021-11-11 DIAGNOSIS — Z483 Aftercare following surgery for neoplasm: Secondary | ICD-10-CM | POA: Insufficient documentation

## 2021-11-11 DIAGNOSIS — M25611 Stiffness of right shoulder, not elsewhere classified: Secondary | ICD-10-CM | POA: Insufficient documentation

## 2021-11-11 DIAGNOSIS — Z170 Estrogen receptor positive status [ER+]: Secondary | ICD-10-CM

## 2021-11-11 DIAGNOSIS — C50411 Malignant neoplasm of upper-outer quadrant of right female breast: Secondary | ICD-10-CM | POA: Diagnosis present

## 2021-11-11 DIAGNOSIS — R600 Localized edema: Secondary | ICD-10-CM | POA: Insufficient documentation

## 2021-11-11 DIAGNOSIS — R293 Abnormal posture: Secondary | ICD-10-CM | POA: Insufficient documentation

## 2021-11-11 NOTE — Therapy (Signed)
OUTPATIENT PHYSICAL THERAPY ONCOLOGY TREATMENT  Patient Name: Dana Kramer MRN: [id number] DOB:01-11-1966, 56 y.o., female Today's Date: 11/11/2021   PT End of Session - 11/11/21 0901     Visit Number 14    Number of Visits 18    PT Start Time 0900    PT Stop Time 0945    PT Time Calculation (min) 45 min    Activity Tolerance Patient tolerated treatment well    Behavior During Therapy [facility] for tasks assessed/performed             Past Medical History:  Diagnosis Date   Allergy 11/1998   Penicillin   Chicken pox    History of transfusion of whole blood    Hyperlipidemia    Malignant neoplasm of upper-outer quadrant of right breast in female, estrogen receptor positive ([place]) 01/13/2021   Migraine    Thyroid disease    Hypothyroidism   Past Surgical History:  Procedure Laterality Date   ABDOMINOPLASTY  10/01/2010   CESAREAN SECTION     twice   HERNIA REPAIR     PORTACATH PLACEMENT Right 01/23/2021   Procedure: Right internal jugular 8 [place] PowerPort with C arm and ultrasound guidance;  Surgeon: Erroll Luna, MD;  Location: [place];  Service: General;  Laterality: Right;   SENTINEL NODE BIOPSY Right 01/23/2021   Procedure: Right axillary sentinel lymph node mapping using Mag Trace;  Surgeon: Erroll Luna, MD;  Location: [place];  Service: General;  Laterality: Right;   SIMPLE MASTECTOMY WITH AXILLARY SENTINEL NODE BIOPSY Bilateral 01/23/2021   Procedure: Right simple mastectomy ;  Left risk reducing simple mastectomy;  Surgeon: Erroll Luna, MD;  Location: [place] [facility];  Service: General;  Laterality: Bilateral;   TUBAL LIGATION     umblica hernia repair  09/47/0962   Patient Active Problem List   Diagnosis Date Noted   Port-A-Cath in place 03/04/2021   Genetic testing 01/27/2021   S/P mastectomy, bilateral 01/23/2021   Family history of colon cancer 01/15/2021   Malignant neoplasm of upper-outer quadrant of right breast in female, estrogen receptor positive ([place])  01/13/2021   Pre-diabetes 11/06/2020   Acquired hypothyroidism 04/14/2018   Migraine 01/18/2015   Insomnia 11/05/2011   Anxiety 11/05/2011   Hyperlipidemia 11/05/2011   Tachycardia 83/66/2947   Umbilical hernia 65/46/5035    PCP: Dr. Lamar Blinks  REFERRING PROVIDER: Dr. Truitt Merle  REFERRING DIAG: C50.411,Z17.0 (ICD-10-CM) - Malignant neoplasm of upper-outer quadrant of right breast in female, estrogen receptor positive ([place]  THERAPY DIAG:  Aftercare following surgery for neoplasm  Malignant neoplasm of upper-outer quadrant of right breast in female, estrogen receptor positive ([place])  Abnormal posture  Stiffness of right shoulder, not elsewhere classified  Stiffness of left shoulder, not elsewhere classified  Localized edema  ONSET DATE: 01/23/21  Rationale for Evaluation and Treatment Rehabilitation  SUBJECTIVE  SUBJECTIVE STATEMENT: I think the swelling is worse   PERTINENT HISTORY:  Patient was diagnosed on 12/10/2020 with right grade II invasive ductal carcinoma breast cancer. She underwent a bilateral mastectomy with a right sentinel node biopsy (4 negative nodes) on 01/23/2021. It is ER/PR positive and HER2 negative with a Ki67 of 10%.  Currently on herceptin q3weeks and tamoxifen.   PAIN:  Are you having pain? Not at rest -  NPRS scale: 4/10 Pain location: the whole left chest Pain orientation: Left  PAIN TYPE: sharp and tight Pain description: intermittent  Aggravating factors: reaching  Relieving factors: rest   PRECAUTIONS: Rt lymphedema risk  WEIGHT BEARING RESTRICTIONS No  FALLS:  Has patient fallen in last 6 months? No  LIVING ENVIRONMENT: Lives with: lives with their family and lives with their spouse Lives in: House/apartment  OCCUPATION: RN   LEISURE:  walks and Metallurgist  HAND DOMINANCE : right   PRIOR LEVEL OF FUNCTION: Independent  PATIENT GOALS what can I do the swelling?   OBJECTIVE PALPATION: Soft edema under left mastectomy incision - tightness evident in pectoralis and lateral trunk - see photo  OBSERVATIONS / OTHER ASSESSMENTS: see photo  POSTURE: rounded shoulders   UPPER EXTREMITY AROM/PROM:  A/PROM RIGHT   03/26/21  10/30/21  Shoulder extension    Shoulder flexion 153 pull   Shoulder abduction 160 pull   Shoulder internal rotation    Shoulder external rotation          (Blank rows = not tested)  A/PROM LEFT   03/26/21 10/30/21  Shoulder extension    Shoulder flexion 145 pull 150 - pectoralis tightness   Shoulder abduction 170 pull 165 - pectoralis tightness  Shoulder internal rotation    Shoulder external rotation      (Blank rows = not tested)  TODAY'S TREATMENT  Date: 11/11/21 In supine: short neck, left axillary nodes, left inguinal nodes, left interaxillary pathway and then work on chest towards axilla and pathway Pt does seem to have more soft edema inferior to the incision which still presents as a possible pocket of edema.  Pt will see Dr. Burr Medico tomorrow and see if she has any opinions on attempting to drain.  Educated pt that Dr. Brantley Stage may or may not want to try by the size of this.  Pt will attempt DC today.   Took new photo  Date: 11/06/21 In supine: short neck, left axillary nodes, left inguinal nodes, left interaxillary pathway and then work on chest towards axilla and pathway Gentle MFR to pectoralis with prolonged hold with opposing hands into various directions.  STM pectoralis with cocoa butter.  Made gray foam rectangle for bra   Date: 10/30/21 In supine: short neck, left axillary nodes, left inguinal nodes, left interaxillary pathway and then work on chest towards axilla and pathway Gentle MFR to pectoralis with prolonged hold with opposing hands into various directions.  STM pectoralis  with cocoa butter.  Made gray foam rectangle for bra   PATIENT EDUCATION:  Education details: POC, how scarring can inhibit lymphatic flow Person educated: Patient Education method: Explanation, Demonstration, and Verbal cues Education comprehension: verbalized understanding  HOME EXERCISE PROGRAM:   ASSESSMENT:  CLINICAL IMPRESSION: Edema seems more full today.  Overall pt is okay for DC with self MLD and pt is hopeful for aspiration.     OBJECTIVE IMPAIRMENTS decreased activity tolerance, decreased ROM, and increased edema.   ACTIVITY LIMITATIONS lifting and reach over head  PARTICIPATION LIMITATIONS: none  PERSONAL FACTORS  none are also affecting patient's functional outcome.   REHAB POTENTIAL: Excellent  CLINICAL DECISION MAKING: Stable/uncomplicated  EVALUATION COMPLEXITY: Low  GOALS: Goals reviewed with patient? Yes  LONG TERM GOALS: Target date: 12/11/2021    Pt will be ind with final HEP including stretching and MLD Baseline: Goal status: MET  2.  Pt will report decreased left chest discomfort by at least 50% Baseline:  Goal status: NOT MET   PLAN: PT FREQUENCY: 1x/week  PT DURATION: 6 weeks  PLANNED INTERVENTIONS: Therapeutic exercises, Patient/Family education, Self Care, Manual lymph drainage, scar mobilization, Taping, Manual therapy, and Re-evaluation  PLAN FOR NEXT SESSION: Left UE MLD for edema inferior to incision, Lt incision and chest mobility - has no LN removed on left but does on Rt    Tevis, Kara R, PT 11/11/2021, 5:18 PM  PHYSICAL THERAPY DISCHARGE SUMMARY  Visits from Start of Care: 14  Current functional level related to goals / functional outcomes: See above   Remaining deficits: Edema remaining   Education / Equipment: Self MLD  Plan: Patient agrees to discharge.  Patient goals were not met. Will cont with SOZO.

## 2021-11-12 ENCOUNTER — Inpatient Hospital Stay: Payer: Managed Care, Other (non HMO)

## 2021-11-12 ENCOUNTER — Inpatient Hospital Stay: Payer: Managed Care, Other (non HMO) | Attending: Hematology | Admitting: Hematology

## 2021-11-12 ENCOUNTER — Encounter: Payer: Self-pay | Admitting: Hematology

## 2021-11-12 ENCOUNTER — Encounter: Payer: Self-pay | Admitting: *Deleted

## 2021-11-12 VITALS — BP 117/80 | HR 77 | Temp 98.4°F | Resp 18 | Ht 60.0 in | Wt 116.2 lb

## 2021-11-12 DIAGNOSIS — R5383 Other fatigue: Secondary | ICD-10-CM | POA: Insufficient documentation

## 2021-11-12 DIAGNOSIS — Z170 Estrogen receptor positive status [ER+]: Secondary | ICD-10-CM | POA: Insufficient documentation

## 2021-11-12 DIAGNOSIS — Z79810 Long term (current) use of selective estrogen receptor modulators (SERMs): Secondary | ICD-10-CM | POA: Diagnosis not present

## 2021-11-12 DIAGNOSIS — C50411 Malignant neoplasm of upper-outer quadrant of right female breast: Secondary | ICD-10-CM

## 2021-11-12 LAB — CBC WITH DIFFERENTIAL (CANCER CENTER ONLY)
Abs Immature Granulocytes: 0.01 10*3/uL (ref 0.00–0.07)
Basophils Absolute: 0 10*3/uL (ref 0.0–0.1)
Basophils Relative: 1 %
Eosinophils Absolute: 0.1 10*3/uL (ref 0.0–0.5)
Eosinophils Relative: 2 %
HCT: 33.8 % — ABNORMAL LOW (ref 36.0–46.0)
Hemoglobin: 11.5 g/dL — ABNORMAL LOW (ref 12.0–15.0)
Immature Granulocytes: 0 %
Lymphocytes Relative: 47 %
Lymphs Abs: 2.4 10*3/uL (ref 0.7–4.0)
MCH: 28.9 pg (ref 26.0–34.0)
MCHC: 34 g/dL (ref 30.0–36.0)
MCV: 84.9 fL (ref 80.0–100.0)
Monocytes Absolute: 0.2 10*3/uL (ref 0.1–1.0)
Monocytes Relative: 4 %
Neutro Abs: 2.4 10*3/uL (ref 1.7–7.7)
Neutrophils Relative %: 46 %
Platelet Count: 195 10*3/uL (ref 150–400)
RBC: 3.98 MIL/uL (ref 3.87–5.11)
RDW: 12 % (ref 11.5–15.5)
WBC Count: 5.2 10*3/uL (ref 4.0–10.5)
nRBC: 0 % (ref 0.0–0.2)

## 2021-11-12 LAB — CMP (CANCER CENTER ONLY)
ALT: 10 U/L (ref 0–44)
AST: 21 U/L (ref 15–41)
Albumin: 3.8 g/dL (ref 3.5–5.0)
Alkaline Phosphatase: 50 U/L (ref 38–126)
Anion gap: 5 (ref 5–15)
BUN: 13 mg/dL (ref 6–20)
CO2: 24 mmol/L (ref 22–32)
Calcium: 9.6 mg/dL (ref 8.9–10.3)
Chloride: 113 mmol/L — ABNORMAL HIGH (ref 98–111)
Creatinine: 0.8 mg/dL (ref 0.44–1.00)
GFR, Estimated: >60 mL/min (ref >60)
Glucose, Bld: 122 mg/dL — ABNORMAL HIGH (ref 70–99)
Potassium: 4.1 mmol/L (ref 3.5–5.1)
Sodium: 142 mmol/L (ref 135–145)
Total Bilirubin: 0.3 mg/dL (ref 0.3–1.2)
Total Protein: 6.7 g/dL (ref 6.5–8.1)

## 2021-11-12 NOTE — Progress Notes (Signed)
[place]   Telephone:([phone] [phone]   Clinic Follow up Note   Patient Care Team: Copland, Gay Filler, MD as PCP - General (Family Medicine) Erroll Luna, MD as Consulting Physician (General Surgery) Truitt Merle, MD as Consulting Physician (Hematology) Eppie Gibson, MD as Attending Physician (Radiation Oncology) Mauro Kaufmann, RN as Oncology Nurse Navigator [name], RN as Oncology Nurse Navigator Bensimhon, Shaune Pascal, MD as Consulting Physician (Cardiology)  Date of Service:  11/12/2021  CHIEF COMPLAINT: f/u of right breast cancer  CURRENT THERAPY:  -Herceptin, starting 02/25/21, currently maintenance injection q21d -tamoxifen, started 06/20/21  ASSESSMENT & PLAN:  Dana Kramer is a 56 y.o. peri-menopausal female with   1. Malignant neoplasm of upper-outer quadrant of right breast, Stage IA, c(T1bN0M0), Triple positive, Grade 2 -found on screening mammogram. 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 -S/p b/l mastectomies on 01/23/21 with Dr. Brantley Stage. 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 Port was placed during surgery -Baseline EF 60-65% with impaired relaxation (grade I diastolic dysfunction). Will monitor q3 months on herceptin -s/p weekly Abraxane/Herceptin x12 02/25/21 - 05/12/21, -she began maintenance herceptin injection on 06/20/21. She is tolerating well. -she began tamoxifen on 06/20/21. She is tolerating well overall. She notes she has not had a period since completing chemo. We will check her hormone levels in 1-2 years and we can consider switching her to AI when she became postmenopausal. -repeat echo 10/21/21 showed slightly lower EF at 50-55%. She was seen by cardiologist and started on entresto. -labs reviewed, overall stable. Given her lowered EF, cardiology recommended holding herceptin for two cycles. She will have repeat echo with f/u on 10/17, and I will see her a day or  two later to hopefully restart treatment.   2. Genetic Testing -performed on 01/15/21, results were negative.     PLAN: -hold herceptin today and next cycle in 3 weeks due to her recent drop of EF on echo  -continue tamoxifen -repeat echo and f/u with cardiology on 10/17 -lab, f/u, and herceptin injection on 10/18    No problem-specific Assessment & Plan notes found for this encounter.   SUMMARY OF ONCOLOGIC HISTORY: Oncology History Overview Note   Cancer Staging  Malignant neoplasm of upper-outer quadrant of right breast in female, estrogen receptor positive ([place]) Staging form: Breast, AJCC 8th Edition - Clinical stage from 01/08/2021: Stage IA (cT1b, cN0, cM0, G2, ER+, PR+, HER2+) - Signed by Truitt Merle, MD on 01/15/2021 - Pathologic stage from 01/23/2021: Stage IA (pT1c, pN0, cM0, G2, ER+, PR+, HER2+) - Signed by Truitt Merle, MD on 02/13/2021     Malignant neoplasm of upper-outer quadrant of right breast in female, estrogen receptor positive ([place])  01/02/2021 Mammogram   EXAM: DIGITAL DIAGNOSTIC BILATERAL MAMMOGRAM WITH TOMOSYNTHESIS AND CAD; ULTRASOUND LEFT BREAST LIMITED; ULTRASOUND RIGHT BREAST LIMITED  IMPRESSION: 1. Highly suspicious 0.7 cm UPPER-OUTER RIGHT breast mass with associated suspicious calcifications extending 1.5 cm anteriorly. Tissue sampling is recommended. 2. No abnormal appearing RIGHT axillary lymph nodes. 3. Benign LOWER LEFT breast cyst corresponding to the LEFT breast screening study finding.   01/08/2021 Cancer Staging   Staging form: Breast, AJCC 8th Edition - Clinical stage from 01/08/2021: Stage IA (cT1b, cN0, cM0, G2, ER+, PR+, HER2+) - Signed by Truitt Merle, MD on 01/15/2021 Stage prefix: Initial diagnosis Nuclear grade: G2 Histologic grading system: 3 grade system   01/08/2021 Pathology Results   Diagnosis Breast, right, needle core biopsy,  upper outer quadrant, 11 o'clock, 5cmfn, ribbon clip - INVASIVE DUCTAL CARCINOMA - DUCTAL CARCINOMA IN SITU  WITH CALCIFICATIONS - SEE COMMENT Microscopic Comment Based on the biopsy, the carcinoma appears Nottingham grade 2 of 3 and measures 0.6 cm in greatest linear extent.  PROGNOSTIC INDICATORS Results: The tumor cells are 2+ for Her2 (EQUIVOCAL). Her2 by FISH will be performed and results reported separately. Estrogen Receptor: 100%, POSITIVE, STRONG STAINING INTENSITY Progesterone Receptor: 70%, POSITIVE, STRONG STAINING INTENSITY Proliferation Marker Ki67: 10%  FLUORESCENCE IN-SITU HYBRIDIZATION Results: GROUP 1: HER2 **POSITIVE**   01/13/2021 Initial Diagnosis   Malignant neoplasm of upper-outer quadrant of right breast in female, estrogen receptor positive ([place])   01/15/2021 Genetic Testing   [facility] CustomNext Panel was Negative. Report date is 01/26/2021.  The CustomNext-Cancer+RNAinsight panel offered by [facility] includes sequencing and rearrangement analysis for the following 47 genes:  APC, ATM, AXIN2, BARD1, BMPR1A, BRCA1, BRCA2, BRIP1, CDH1, CDK4, CDKN2A, CHEK2, CTNNA1, DICER1, EPCAM, GREM1, HOXB13, KIT, MEN1, MLH1, MSH2, MSH3, MSH6, MUTYH, NBN, NF1, NTHL1, PALB2, PDGFRA, PMS2, POLD1, POLE, PTEN, RAD50, RAD51C, RAD51D, SDHA, SDHB, SDHC, SDHD, SMAD4, SMARCA4, STK11, TP53, TSC1, TSC2, and VHL.  RNA data is routinely analyzed for use in variant interpretation for all genes.   01/23/2021 Cancer Staging   Staging form: Breast, AJCC 8th Edition - Pathologic stage from 01/23/2021: Stage IA (pT1c, pN0, cM0, G2, ER+, PR+, HER2+) - Signed by Truitt Merle, MD on 02/13/2021 Histologic grading system: 3 grade system Residual tumor (R): R0 - None   01/23/2021 Definitive Surgery   FINAL MICROSCOPIC DIAGNOSIS:   A. BREAST, LEFT, MASTECTOMY:  - Fibrocystic changes with sclerosing adenosis and calcifications.  - Fibroadenoma (0.6 cm).  - No evidence of malignancy.   B. BREAST, RIGHT, MASTECTOMY:  - Invasive and in situ ductal carcinoma, 1.7 cm.  - Margins negative for carcinoma.  -  Biopsy site and biopsy clip.  - See oncology table.   C. LYMPH NODE, RIGHT AXILLARY, SENTINEL, EXCISION:  - One lymph node negative for metastatic carcinoma (0/1).   D. LYMPH NODE, RIGHT AXILLARY, SENTINEL, EXCISION:  - One lymph node negative for metastatic carcinoma (0/1).   E. LYMPH NODE, RIGHT AXILLARY, SENTINEL, EXCISION:  - One lymph node negative for metastatic carcinoma (0/1).   F. LYMPH NODE, RIGHT AXILLARY, SENTINEL, EXCISION:  - One lymph node negative for metastatic carcinoma (0/1).    02/25/2021 - 10/22/2021 Chemotherapy   Patient is on Treatment Plan : BREAST Paclitaxel + Trastuzumab q7d / Trastuzumab q21d     02/25/2021 -  Chemotherapy   Patient is on Treatment Plan : BREAST Paclitaxel + Trastuzumab q7d / Trastuzumab q21d        INTERVAL HISTORY:  Dana Kramer is here for a follow up of breast cancer. She was last seen by me on 08/20/21. She presents to the clinic alone. She reports she is doing well overall. She reports continued fatigue but pushes through.   All other systems were reviewed with the patient and are negative.  MEDICAL HISTORY:  Past Medical History:  Diagnosis Date   Allergy 11/1998   Penicillin   Chicken pox    History of transfusion of whole blood    Hyperlipidemia    Malignant neoplasm of upper-outer quadrant of right breast in female, estrogen receptor positive ([place]) 01/13/2021   Migraine    Thyroid disease    Hypothyroidism    SURGICAL HISTORY: Past Surgical History:  Procedure Laterality Date   ABDOMINOPLASTY  10/01/2010   CESAREAN SECTION  twice   HERNIA REPAIR     PORTACATH PLACEMENT Right 01/23/2021   Procedure: Right internal jugular 8 [place] PowerPort with C arm and ultrasound guidance;  Surgeon: Erroll Luna, MD;  Location: [facility];  Service: General;  Laterality: Right;   SENTINEL NODE BIOPSY Right 01/23/2021   Procedure: Right axillary sentinel lymph node mapping using Mag Trace;  Surgeon: Erroll Luna, MD;   Location: [facility];  Service: General;  Laterality: Right;   SIMPLE MASTECTOMY WITH AXILLARY SENTINEL NODE BIOPSY Bilateral 01/23/2021   Procedure: Right simple mastectomy ;  Left risk reducing simple mastectomy;  Surgeon: Erroll Luna, MD;  Location: [facility];  Service: General;  Laterality: Bilateral;   TUBAL LIGATION     umblica hernia repair  28/63/8177    I have reviewed the social history and family history with the patient and they are unchanged from previous note.  ALLERGIES:  is allergic to betadine [povidone iodine] and penicillins.  MEDICATIONS:  Current Outpatient Medications  Medication Sig Dispense Refill   amLODipine (NORVASC) 5 MG tablet Take 1 tablet (5 mg total) by mouth daily. 90 tablet 3   b complex vitamins capsule Take 1 capsule by mouth daily.     Calcium Citrate-Vitamin D (CALCIUM + D PO) Take 1 tablet by mouth daily.     Cholecalciferol (VITAMIN D PO) Take 1 tablet by mouth daily.     levothyroxine (SYNTHROID) 25 MCG tablet TAKE 1 TABLET(25 MCG) BY MOUTH DAILY BEFORE BREAKFAST 90 tablet 3   Melatonin 10 MG CAPS Take 10 mg by mouth at bedtime.     metoprolol succinate (TOPROL-XL) 50 MG 24 hr tablet Take 1 tablet (50 mg total) by mouth daily. Take with or immediately following a meal. 90 tablet 3   Multiple Vitamins-Minerals (WOMENS MULTIVITAMIN PO) Take 1 tablet by mouth daily.     sacubitril-valsartan (ENTRESTO) 24-26 MG Take 1 tablet by mouth 2 (two) times daily. 60 tablet 3   simvastatin (ZOCOR) 20 MG tablet TAKE 1 TABLET(20 MG) BY MOUTH AT BEDTIME 90 tablet 3   tamoxifen (NOLVADEX) 20 MG tablet TAKE 1 TABLET(20 MG) BY MOUTH DAILY 30 tablet 3   traZODone (DESYREL) 50 MG tablet Take 0.5-1 tablets (25-50 mg total) by mouth at bedtime as needed for sleep. 30 tablet 3   No current facility-administered medications for this visit.    PHYSICAL EXAMINATION: ECOG PERFORMANCE STATUS: 0 - Asymptomatic  Vitals:   11/12/21 1409  BP: 117/80  Pulse: 77  Resp: 18  Temp:  98.4 F (36.9 C)  SpO2: 100%   Wt Readings from Last 3 Encounters:  11/12/21 116 lb 3.2 oz (52.7 kg)  11/03/21 117 lb (53.1 kg)  10/22/21 117 lb 12 oz (53.4 kg)     GENERAL:alert, no distress and comfortable SKIN: skin color, texture, turgor are normal, no rashes or significant lesions EYES: normal, Conjunctiva are pink and non-injected, sclera clear  NECK: supple, thyroid normal size, non-tender, without nodularity LYMPH:  no palpable lymphadenopathy in the cervical, axillary LUNGS: clear to auscultation and percussion with normal breathing effort HEART: regular rate & rhythm and no murmurs and no lower extremity edema ABDOMEN:abdomen soft, non-tender and normal bowel sounds Musculoskeletal:no cyanosis of digits and no clubbing  NEURO: alert & oriented x 3 with fluent speech, no focal motor/sensory deficits BREAST: No palpable mass, nodules or adenopathy bilaterally. Breast exam benign.   LABORATORY DATA:  I have reviewed the data as listed    Latest Ref Rng & Units 11/12/2021  1:48 PM 08/20/2021   10:49 AM 06/20/2021    8:13 AM  CBC  WBC 4.0 - 10.5 K/uL 5.2  5.0  4.6   Hemoglobin 12.0 - 15.0 g/dL 11.5  11.6  10.5   Hematocrit 36.0 - 46.0 % 33.8  33.7  31.3   Platelets 150 - 400 K/uL 195  185  217         Latest Ref Rng & Units 11/12/2021    1:48 PM 11/03/2021   11:52 AM 08/20/2021   10:49 AM  CMP  Glucose 70 - 99 mg/dL 122  102  125   BUN 6 - 20 mg/dL _0 Creatinine 0.44 - 1.00 mg/dL 0.80  0.71  0.70   Sodium 135 - 145 mmol/L 142  140  140   Potassium 3.5 - 5.1 mmol/L 4.1  3.9  3.9   Chloride 98 - 111 mmol/L 113  107  106   CO2 22 - 32 mmol/L _1 Calcium 8.9 - 10.3 mg/dL 9.6  9.1  9.8   Total Protein 6.5 - 8.1 g/dL 6.7   7.0   Total Bilirubin 0.3 - 1.2 mg/dL 0.3   0.3   Alkaline Phos 38 - 126 U/L 50   55   AST 15 - 41 U/L 21   17   ALT 0 - 44 U/L 10   6       RADIOGRAPHIC STUDIES: I have personally reviewed the radiological images as listed  and agreed with the findings in the report. No results found.    No orders of the defined types were placed in this encounter.  All questions were answered. The patient knows to call the clinic with any problems, questions or concerns. No barriers to learning was detected. The total time spent in the appointment was 30 minutes.     Truitt Merle, MD 11/12/2021   I, Wilburn Mylar, am acting as scribe for Truitt Merle, MD.   I have reviewed the above documentation for accuracy and completeness, and I agree with the above.

## 2021-11-15 NOTE — Progress Notes (Unsigned)
[place] at [facility] [address], [place], [place] [address] [phone]  Date:  11/19/2021   Name:  Dana Kramer   DOB:  August 12, 1965   MRN:  [id number]  PCP:  Darreld Mclean, MD    Chief Complaint: No chief complaint on file.   History of Present Illness:  Dana Kramer is a 56 y.o. very pleasant female patient who presents with the following:  Pt seen today for a recheck visit Last seen by myself in March Most recent visit with myself was in January- History of hypothyroidism, hyperlipidemia, anxiety and insomnia She was also diagnosed with breast cancer 2022, had a bilateral mastectomy in November and is receiving chemotherapy  Seen by Dr Burr Medico last week: 1. Malignant neoplasm of upper-outer quadrant of right breast, Stage IA, c(T1bN0M0), Triple positive, Grade 2 -found on screening mammogram. 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 -S/p b/l mastectomies on 01/23/21 with Dr. Brantley Stage. 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 Port was placed during surgery -Baseline EF 60-65% with impaired relaxation (grade I diastolic dysfunction). Will monitor q3 months on herceptin -s/p weekly Abraxane/Herceptin x12 02/25/21 - 05/12/21, -she began maintenance herceptin injection on 06/20/21. She is tolerating well. -she began tamoxifen on 06/20/21. She is tolerating well overall. She notes she has not had a period since completing chemo. We will check her hormone levels in 1-2 years and we can consider switching her to AI when she became postmenopausal. -repeat echo 10/21/21 showed slightly lower EF at 50-55%. She was seen by cardiologist and started on entresto. -labs reviewed, overall stable. Given her lowered EF, cardiology recommended holding herceptin for two cycles. She will have repeat echo with f/u on 10/17, and I will see her a day or two later to hopefully restart treatment. 2. Genetic  Testing -performed on 01/15/21, results were negative. PLAN: -hold herceptin today and next cycle in 3 weeks due to her recent drop of EF on echo  -continue tamoxifen -repeat echo and f/u with cardiology on 10/17 -lab, f/u, and herceptin injection on 10/18 \  Lab Results  Component Value Date   TSH 3.66 05/15/2021    Patient Active Problem List   Diagnosis Date Noted   Port-A-Cath in place 03/04/2021   Genetic testing 01/27/2021   S/P mastectomy, bilateral 01/23/2021   Family history of colon cancer 01/15/2021   Malignant neoplasm of upper-outer quadrant of right breast in female, estrogen receptor positive ([place]) 01/13/2021   Pre-diabetes 11/06/2020   Acquired hypothyroidism 04/14/2018   Migraine 01/18/2015   Insomnia 11/05/2011   Anxiety 11/05/2011   Hyperlipidemia 11/05/2011   Tachycardia 70/17/7939   Umbilical hernia 03/00/9233    Past Medical History:  Diagnosis Date   Allergy 11/1998   Penicillin   Chicken pox    History of transfusion of whole blood    Hyperlipidemia    Malignant neoplasm of upper-outer quadrant of right breast in female, estrogen receptor positive ([place]) 01/13/2021   Migraine    Thyroid disease    Hypothyroidism    Past Surgical History:  Procedure Laterality Date   ABDOMINOPLASTY  10/01/2010   CESAREAN SECTION     twice   HERNIA REPAIR     PORTACATH PLACEMENT Right 01/23/2021   Procedure: Right internal jugular 8 [place] PowerPort with C arm and ultrasound guidance;  Surgeon: Erroll Luna, MD;  Location: [place];  Service: General;  Laterality: Right;  SENTINEL NODE BIOPSY Right 01/23/2021   Procedure: Right axillary sentinel lymph node mapping using Mag Trace;  Surgeon: Erroll Luna, MD;  Location: [place];  Service: General;  Laterality: Right;   SIMPLE MASTECTOMY WITH AXILLARY SENTINEL NODE BIOPSY Bilateral 01/23/2021   Procedure: Right simple mastectomy ;  Left risk reducing simple mastectomy;  Surgeon: Erroll Luna, MD;  Location:  [place];  Service: General;  Laterality: Bilateral;   TUBAL LIGATION     umblica hernia repair  35/00/9381    Social History   Tobacco Use   Smoking status: Never   Smokeless tobacco: Never  Vaping Use   Vaping Use: Never used  Substance Use Topics   Alcohol use: Never   Drug use: Never    Family History  Problem Relation Age of Onset   Hypertension Mother    Cancer Father 63       esophageal cancer   Leukemia Maternal Grandfather        dx. >50   Colon cancer Paternal Grandfather        dx. >50   Lung cancer Cousin        dx. 52s, did not smoke    Allergies  Allergen Reactions   Betadine [Povidone Iodine] Rash   Penicillins Rash    Mainly upper body only.    Medication list has been reviewed and updated.  Current Outpatient Medications on File Prior to Visit  Medication Sig Dispense Refill   amLODipine (NORVASC) 5 MG tablet Take 1 tablet (5 mg total) by mouth daily. 90 tablet 3   b complex vitamins capsule Take 1 capsule by mouth daily.     Calcium Citrate-Vitamin D (CALCIUM + D PO) Take 1 tablet by mouth daily.     Cholecalciferol (VITAMIN D PO) Take 1 tablet by mouth daily.     levothyroxine (SYNTHROID) 25 MCG tablet TAKE 1 TABLET(25 MCG) BY MOUTH DAILY BEFORE BREAKFAST 90 tablet 3   Melatonin 10 MG CAPS Take 10 mg by mouth at bedtime.     metoprolol succinate (TOPROL-XL) 50 MG 24 hr tablet Take 1 tablet (50 mg total) by mouth daily. Take with or immediately following a meal. 90 tablet 3   Multiple Vitamins-Minerals (WOMENS MULTIVITAMIN PO) Take 1 tablet by mouth daily.     sacubitril-valsartan (ENTRESTO) 24-26 MG Take 1 tablet by mouth 2 (two) times daily. 60 tablet 3   simvastatin (ZOCOR) 20 MG tablet TAKE 1 TABLET(20 MG) BY MOUTH AT BEDTIME 90 tablet 3   tamoxifen (NOLVADEX) 20 MG tablet TAKE 1 TABLET(20 MG) BY MOUTH DAILY 30 tablet 3   traZODone (DESYREL) 50 MG tablet Take 0.5-1 tablets (25-50 mg total) by mouth at bedtime as needed for sleep. 30 tablet 3    No current facility-administered medications on file prior to visit.    Review of Systems:  As per HPI- otherwise negative.   Physical Examination: There were no vitals filed for this visit. There were no vitals filed for this visit. There is no height or weight on file to calculate BMI. Ideal Body Weight:    GEN: no acute distress. HEENT: Atraumatic, Normocephalic.  Ears and Nose: No external deformity. CV: RRR, No M/G/R. No JVD. No thrill. No extra heart sounds. PULM: CTA B, no wheezes, crackles, rhonchi. No retractions. No resp. distress. No accessory muscle use. ABD: S, NT, ND, +BS. No rebound. No HSM. EXTR: No c/c/e PSYCH: Normally interactive. Conversant.    Assessment and Plan: ***  Signed Lamar Blinks, MD

## 2021-11-17 ENCOUNTER — Encounter: Payer: Self-pay | Admitting: *Deleted

## 2021-11-19 ENCOUNTER — Encounter: Payer: Self-pay | Admitting: Family Medicine

## 2021-11-19 ENCOUNTER — Ambulatory Visit: Payer: Managed Care, Other (non HMO) | Admitting: Family Medicine

## 2021-11-19 VITALS — BP 110/60 | HR 67 | Temp 97.6°F | Resp 18 | Ht 60.0 in | Wt 115.5 lb

## 2021-11-19 DIAGNOSIS — Z131 Encounter for screening for diabetes mellitus: Secondary | ICD-10-CM | POA: Diagnosis not present

## 2021-11-19 DIAGNOSIS — F5102 Adjustment insomnia: Secondary | ICD-10-CM

## 2021-11-19 DIAGNOSIS — I10 Essential (primary) hypertension: Secondary | ICD-10-CM

## 2021-11-19 DIAGNOSIS — E039 Hypothyroidism, unspecified: Secondary | ICD-10-CM

## 2021-11-19 DIAGNOSIS — R7303 Prediabetes: Secondary | ICD-10-CM

## 2021-11-19 LAB — HEMOGLOBIN A1C: Hgb A1c MFr Bld: 6 % (ref 4.6–6.5)

## 2021-11-19 LAB — TSH: TSH: 3.09 u[IU]/mL (ref 0.35–5.50)

## 2021-11-19 MED ORDER — TRAZODONE HCL 50 MG PO TABS: 25.0000 mg | ORAL_TABLET | Freq: Every evening | ORAL | 3 refills | Status: DC | PRN

## 2021-11-19 NOTE — Patient Instructions (Signed)
Good to see you today!  I will be in touch with your labs asap Plan for flu shot at work If you notice your BP going too low start by reducing amlodipine by 50%  Assuming all is well please see me in about 6 months

## 2021-11-20 ENCOUNTER — Encounter: Payer: Managed Care, Other (non HMO) | Admitting: Rehabilitation

## 2021-11-20 ENCOUNTER — Other Ambulatory Visit: Payer: Self-pay

## 2021-11-24 ENCOUNTER — Other Ambulatory Visit (HOSPITAL_COMMUNITY): Payer: Self-pay | Admitting: Cardiology

## 2021-11-27 ENCOUNTER — Encounter: Payer: Managed Care, Other (non HMO) | Admitting: Rehabilitation

## 2021-12-04 ENCOUNTER — Encounter: Payer: Managed Care, Other (non HMO) | Admitting: Rehabilitation

## 2021-12-11 ENCOUNTER — Encounter: Payer: Managed Care, Other (non HMO) | Admitting: Rehabilitation

## 2021-12-23 ENCOUNTER — Ambulatory Visit (HOSPITAL_COMMUNITY)
Admission: RE | Admit: 2021-12-23 | Discharge: 2021-12-23 | Disposition: A | Discharge status: Home / Self Care | Payer: Managed Care, Other (non HMO) | Source: Ambulatory Visit | Attending: Cardiology | Admitting: Cardiology

## 2021-12-23 VITALS — BP 150/90 | HR 75 | Wt 114.6 lb

## 2021-12-23 DIAGNOSIS — R000 Tachycardia, unspecified: Secondary | ICD-10-CM | POA: Insufficient documentation

## 2021-12-23 DIAGNOSIS — Z9013 Acquired absence of bilateral breasts and nipples: Secondary | ICD-10-CM | POA: Insufficient documentation

## 2021-12-23 DIAGNOSIS — T451X5A Adverse effect of antineoplastic and immunosuppressive drugs, initial encounter: Secondary | ICD-10-CM | POA: Insufficient documentation

## 2021-12-23 DIAGNOSIS — Z170 Estrogen receptor positive status [ER+]: Secondary | ICD-10-CM | POA: Diagnosis not present

## 2021-12-23 DIAGNOSIS — I10 Essential (primary) hypertension: Secondary | ICD-10-CM | POA: Insufficient documentation

## 2021-12-23 DIAGNOSIS — Z0189 Encounter for other specified special examinations: Secondary | ICD-10-CM

## 2021-12-23 DIAGNOSIS — Z853 Personal history of malignant neoplasm of breast: Secondary | ICD-10-CM | POA: Diagnosis present

## 2021-12-23 DIAGNOSIS — I427 Cardiomyopathy due to drug and external agent: Secondary | ICD-10-CM

## 2021-12-23 DIAGNOSIS — I5030 Unspecified diastolic (congestive) heart failure: Secondary | ICD-10-CM

## 2021-12-23 DIAGNOSIS — C50411 Malignant neoplasm of upper-outer quadrant of right female breast: Secondary | ICD-10-CM | POA: Diagnosis not present

## 2021-12-23 LAB — ECHOCARDIOGRAM COMPLETE
Area-P 1/2: 4.49 cm2
Calc EF: 60.8 %
S' Lateral: 2.7 cm
Single Plane A2C EF: 59.1 %
Single Plane A4C EF: 60.8 %

## 2021-12-23 MED ORDER — ENTRESTO 24-26 MG PO TABS: 1.0000 | ORAL_TABLET | Freq: Two times a day (BID) | ORAL | 3 refills | Status: DC

## 2021-12-23 NOTE — Patient Instructions (Signed)
There has been no changes to your medications.  Your physician has requested that you have an echocardiogram. Echocardiography is a painless test that uses sound waves to create images of your heart. It provides your doctor with information about the size and shape of your heart and how well your heart's chambers and valves are working. This procedure takes approximately one hour. There are no restrictions for this procedure. Please do NOT wear cologne, perfume, aftershave, or lotions (deodorant is allowed). Please arrive 15 minutes prior to your appointment time.  Your physician recommends that you schedule a follow-up appointment in: 2 months   If you have any questions or concerns before your next appointment please send us a message through mychart or call our office at [phone].    TO LEAVE A MESSAGE FOR THE NURSE SELECT OPTION 2, PLEASE LEAVE A MESSAGE INCLUDING: YOUR NAME DATE OF BIRTH CALL BACK NUMBER REASON FOR CALL**this is important as we prioritize the call backs  YOU WILL RECEIVE A CALL BACK THE SAME DAY AS LONG AS YOU CALL BEFORE 4:00 PM  At the [facility], you and your health needs are our priority. As part of our continuing mission to provide you with exceptional heart care, we have created designated Provider Care Teams. These Care Teams include your primary Cardiologist (physician) and Advanced Practice Providers (APPs- Physician Assistants and Nurse Practitioners) who all work together to provide you with the care you need, when you need it.   You may see any of the following providers on your designated Care Team at your next follow up: Dr Daniel Bensimhon Dr Dalton McLean Dr. Aditya Sabharwal Amy Clegg, NP Brittainy Simmons, PA Jessica Milford,NP Lindsay Finch, PA Alma Diaz, NP Lauren Kemp, PharmD   Please be sure to bring in all your medications bottles to every appointment.    

## 2021-12-23 NOTE — Progress Notes (Signed)
[facility]ED [facility] CLINIC CONSULT NOTE  Referring Physician: Dr Burr Medico Primary Care:Dr Copland  Primary HF Cardiologist: Dr Aundra Dubin   HPI: Dana Kramer is a 56 y.o. initially referred to the Cardio-Oncology clinic by Dr Burr Medico for reduced EF. She has a history of breast cancer, s/p bilateral mastectomies 01/2021 , and HTN.   R breast Cancer, Stage IA (pT1c, pN0, cM0, G2, ER+, PR+, HER2+).  Paclitaxel + Trastuzumab q7d / Trastuzumab q21d . Maintenance Herceptin injection q3weeks, starting 06/20/21. Plan to continue Herceptin for total 12 months.   Echo 04/2021 EF 60-65%  Echo 07/2021 EF 60-65% 23.2%  Echo 10/2021 EF 50-55% GLS 22.3%  Herceptin held with drop in EF on 8/23 echo.  She was continued on Toprol XL and started on Entresto.  Echo was done today and reviewed, EF 60% with GLS -18.2%.   She has been doing well symptomatically.  No significant exertional dyspnea or chest pain.  She exercises regularly.  SBP in 110s-120s at home, she says that BP is much better controlled on Entresto.   Labs (9/23): K 4.1, creatinine 0.8  Review of Systems: All systems reviewed and negative except as per HPI.  Past Medical History:  Diagnosis Date   Allergy 11/1998   Penicillin   Chicken pox    History of transfusion of whole blood    Hyperlipidemia    Malignant neoplasm of upper-outer quadrant of right breast in female, estrogen receptor positive ([place]) 01/13/2021   Migraine    Thyroid disease    Hypothyroidism    Current Outpatient Medications  Medication Sig Dispense Refill   amLODipine (NORVASC) 5 MG tablet Take 1 tablet (5 mg total) by mouth daily. 90 tablet 3   b complex vitamins capsule Take 1 capsule by mouth daily.     Calcium Citrate-Vitamin D (CALCIUM + D PO) Take 1 tablet by mouth daily.     Cholecalciferol (VITAMIN D PO) Take 1 tablet by mouth daily.     levothyroxine (SYNTHROID) 25 MCG tablet TAKE 1 TABLET(25 MCG) BY MOUTH DAILY BEFORE BREAKFAST 90 tablet 3   Melatonin 10 MG CAPS Take 10 mg  by mouth at bedtime.     metoprolol succinate (TOPROL-XL) 50 MG 24 hr tablet Take 1 tablet (50 mg total) by mouth daily. Take with or immediately following a meal. 90 tablet 3   Multiple Vitamins-Minerals (WOMENS MULTIVITAMIN PO) Take 1 tablet by mouth daily.     simvastatin (ZOCOR) 20 MG tablet TAKE 1 TABLET(20 MG) BY MOUTH AT BEDTIME 90 tablet 3   tamoxifen (NOLVADEX) 20 MG tablet TAKE 1 TABLET(20 MG) BY MOUTH DAILY 30 tablet 3   traZODone (DESYREL) 50 MG tablet Take 0.5-1 tablets (25-50 mg total) by mouth at bedtime as needed for sleep. 90 tablet 3   sacubitril-valsartan (ENTRESTO) 24-26 MG Take 1 tablet by mouth 2 (two) times daily. 180 tablet 3   No current facility-administered medications for this encounter.    Allergies  Allergen Reactions   Betadine [Povidone Iodine] Rash   Penicillins Rash    Mainly upper body only.      Social History   Socioeconomic History   Marital status: Married    Spouse name: Not on file   Number of children: 2   Years of education: Not on file   Highest education level: Not on file  Occupational History   Not on file  Tobacco Use   Smoking status: Never   Smokeless tobacco: Never  Vaping Use   Vaping Use:  Never used  Substance and Sexual Activity   Alcohol use: Never   Drug use: Never   Sexual activity: Not Currently  Other Topics Concern   Not on file  Social History Narrative   Not on file   Social Determinants of Health   Financial Resource Strain: Not on file  Food Insecurity: Not on file  Transportation Needs: Not on file  Physical Activity: Not on file  Stress: Not on file  Social Connections: Not on file  Intimate Partner Violence: Not on file      Family History  Problem Relation Age of Onset   Hypertension Mother    Cancer Father 65       esophageal cancer   Leukemia Maternal Grandfather        dx. >50   Colon cancer Paternal Grandfather        dx. >50   Lung cancer Cousin        dx. 42s, did not smoke     Vitals:   12/23/21 1009  BP: (!) 150/90  Pulse: 75  SpO2: 99%  Weight: 52 kg (114 lb 9.6 oz)    PHYSICAL EXAM: General: NAD Neck: No JVD, no thyromegaly or thyroid nodule.  Lungs: Clear to auscultation bilaterally with normal respiratory effort. CV: Nondisplaced PMI.  Heart regular S1/S2, no S3/S4, no murmur.  No peripheral edema.  No carotid bruit.  Normal pedal pulses.  Abdomen: Soft, nontender, no hepatosplenomegaly, no distention.  Skin: Intact without lesions or rashes.  Neurologic: Alert and oriented x 3.  Psych: Normal affect. Extremities: No clubbing or cyanosis.  HEENT: Normal.   ASSESSMENT & PLAN:  1. Breast Cancer: Started herceptin 02/2021. Echo in 8/23 with EF down to 50-55%.  Herceptin was held and she was started on Entresto + Toprol XL.  Echo today showed improvement in EF to 60%.  NYHA class I, not volume overloaded on exam.  - She can resume Herceptin treatment (due for Herceptin tomorrow).  - Continue current Toprol XL and Entresto.  - Followup in 2 months with echo.  2. HTN: BP under better control since starting Entresto.   Followup 2 months with echo.   [name] 12/23/2021

## 2021-12-23 NOTE — Progress Notes (Signed)
Echocardiogram 2D Echocardiogram has been performed.  Dana Kramer 12/23/2021, 9:51 AM

## 2021-12-24 ENCOUNTER — Encounter: Payer: Self-pay | Admitting: *Deleted

## 2021-12-24 ENCOUNTER — Inpatient Hospital Stay (HOSPITAL_BASED_OUTPATIENT_CLINIC_OR_DEPARTMENT_OTHER): Payer: Managed Care, Other (non HMO) | Admitting: Nurse Practitioner

## 2021-12-24 ENCOUNTER — Encounter: Payer: Self-pay | Admitting: Nurse Practitioner

## 2021-12-24 ENCOUNTER — Inpatient Hospital Stay: Payer: Managed Care, Other (non HMO) | Attending: Hematology

## 2021-12-24 DIAGNOSIS — C50411 Malignant neoplasm of upper-outer quadrant of right female breast: Secondary | ICD-10-CM | POA: Diagnosis not present

## 2021-12-24 DIAGNOSIS — Z170 Estrogen receptor positive status [ER+]: Secondary | ICD-10-CM | POA: Insufficient documentation

## 2021-12-24 DIAGNOSIS — Z5112 Encounter for antineoplastic immunotherapy: Secondary | ICD-10-CM | POA: Insufficient documentation

## 2021-12-24 DIAGNOSIS — Z79899 Other long term (current) drug therapy: Secondary | ICD-10-CM | POA: Insufficient documentation

## 2021-12-24 MED ORDER — TRASTUZUMAB-HYALURONIDASE-OYSK 600-10000 MG-UNT/5ML ~~LOC~~ SOLN
600.0000 mg | Freq: Once | SUBCUTANEOUS | Status: AC
  Administered 2021-12-24: 600 mg via SUBCUTANEOUS
  Filled 2021-12-24: qty 5

## 2021-12-24 MED ORDER — DIPHENHYDRAMINE HCL 25 MG PO CAPS
25.0000 mg | ORAL_CAPSULE | Freq: Once | ORAL | Status: AC
  Administered 2021-12-24: 25 mg via ORAL
  Filled 2021-12-24: qty 1

## 2021-12-24 MED ORDER — TAMOXIFEN CITRATE 20 MG PO TABS: ORAL_TABLET | ORAL | 3 refills | Status: DC

## 2021-12-24 MED ORDER — ACETAMINOPHEN 325 MG PO TABS
650.0000 mg | ORAL_TABLET | Freq: Once | ORAL | Status: AC
  Administered 2021-12-24: 650 mg via ORAL
  Filled 2021-12-24: qty 2

## 2021-12-24 NOTE — Progress Notes (Signed)
Dana Kramer   [phone] [phone]   Clinic Follow up Note   Patient Care Team: Copland, Gay Filler, MD as PCP - General (Family Medicine) Erroll Luna, MD as Consulting Physician (General Surgery) Truitt Merle, MD as Consulting Physician (Hematology) Eppie Gibson, MD as Attending Physician (Radiation Oncology) Mauro Kaufmann, RN as Oncology Nurse Navigator [name], RN as Oncology Nurse Navigator Bensimhon, Shaune Pascal, MD as Consulting Physician (Cardiology) 12/24/2021  CHIEF COMPLAINT: Follow up right breast cancer   SUMMARY OF ONCOLOGIC HISTORY: Oncology History Overview Note   Cancer Staging  Malignant neoplasm of upper-outer quadrant of right breast in female, estrogen receptor positive ([place]) Staging form: Breast, AJCC 8th Edition - Clinical stage from 01/08/2021: Stage IA (cT1b, cN0, cM0, G2, ER+, PR+, HER2+) - Signed by Truitt Merle, MD on 01/15/2021 - Pathologic stage from 01/23/2021: Stage IA (pT1c, pN0, cM0, G2, ER+, PR+, HER2+) - Signed by Truitt Merle, MD on 02/13/2021     Malignant neoplasm of upper-outer quadrant of right breast in female, estrogen receptor positive ([place])  01/02/2021 Mammogram   EXAM: DIGITAL DIAGNOSTIC BILATERAL MAMMOGRAM WITH TOMOSYNTHESIS AND CAD; ULTRASOUND LEFT BREAST LIMITED; ULTRASOUND RIGHT BREAST LIMITED  IMPRESSION: 1. Highly suspicious 0.7 cm UPPER-OUTER RIGHT breast mass with associated suspicious calcifications extending 1.5 cm anteriorly. Tissue sampling is recommended. 2. No abnormal appearing RIGHT axillary lymph nodes. 3. Benign LOWER LEFT breast cyst corresponding to the LEFT breast screening study finding.   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  PROGNOSTIC INDICATORS Results: The tumor cells are 2+ for Her2 (EQUIVOCAL). Her2 by FISH will be performed and results reported separately. Estrogen Receptor: 100%, POSITIVE, STRONG STAINING INTENSITY Progesterone Receptor: 70%, POSITIVE, STRONG STAINING INTENSITY Proliferation Marker Ki67: 10%  FLUORESCENCE IN-SITU HYBRIDIZATION Results: GROUP 1: HER2 **POSITIVE**   01/13/2021 Initial Diagnosis   Malignant neoplasm of upper-outer quadrant of right breast in female, estrogen receptor positive ([place])   01/15/2021 Genetic Testing   [facility] CustomNext Panel was Negative. Report date is 01/26/2021.  The CustomNext-Cancer+RNAinsight panel offered by [facility] includes sequencing and rearrangement analysis for the following 47 genes:  APC, ATM, AXIN2, BARD1, BMPR1A, BRCA1, BRCA2, BRIP1, CDH1, CDK4, CDKN2A, CHEK2, CTNNA1, DICER1, EPCAM, GREM1, HOXB13, KIT, MEN1, MLH1, MSH2, MSH3, MSH6, MUTYH, NBN, NF1, NTHL1, PALB2, PDGFRA, PMS2, POLD1, POLE, PTEN, RAD50, RAD51C, RAD51D, SDHA, SDHB, SDHC, SDHD, SMAD4, SMARCA4, STK11, TP53, TSC1, TSC2, and VHL.  RNA data is routinely analyzed for use in variant interpretation for all genes.   01/23/2021 Cancer Staging   Staging form: Breast, AJCC 8th Edition - Pathologic stage from 01/23/2021: Stage IA (pT1c, pN0, cM0, G2, ER+, PR+, HER2+) - Signed by Truitt Merle, MD on 02/13/2021 Histologic grading system: 3 grade system Residual tumor (R): R0 - None   01/23/2021 Definitive Surgery   FINAL MICROSCOPIC DIAGNOSIS:   A. BREAST, LEFT, MASTECTOMY:  - Fibrocystic changes with sclerosing adenosis and  calcifications.  - Fibroadenoma (  0.6 cm).  - No evidence of malignancy.   B. BREAST, RIGHT, MASTECTOMY:  - Invasive and in situ ductal carcinoma, 1.7 cm.  - Margins negative for carcinoma.  - Biopsy site and biopsy clip.  - See oncology table.   C. LYMPH NODE, RIGHT AXILLARY, SENTINEL, EXCISION:  - One lymph node negative for metastatic carcinoma (0/1).   D. LYMPH NODE, RIGHT AXILLARY, SENTINEL, EXCISION:  - One lymph node negative for metastatic carcinoma (0/1).   E. LYMPH NODE, RIGHT AXILLARY, SENTINEL, EXCISION:  - One lymph node negative for metastatic carcinoma (0/1).   F. LYMPH NODE, RIGHT AXILLARY, SENTINEL, EXCISION:  - One lymph node negative for metastatic carcinoma (0/1).    02/25/2021 - 10/22/2021 Chemotherapy   Patient is on Treatment Plan : BREAST Paclitaxel + Trastuzumab q7d / Trastuzumab q21d     02/25/2021 -  Chemotherapy   Patient is on Treatment Plan : BREAST Paclitaxel + Trastuzumab q7d / Trastuzumab q21d       CURRENT THERAPY:  Herceptin, starting 02/25/21, currently maintenance inj q21 days Tamoxifen, started 06/20/21  INTERVAL HISTORY: Dana Kramer returns for follow up as scheduled. Last seen by Dr. Burr Medico 11/12/21. Due to reduced EF (50-55%), Herceptin was held and she began [place].  Repeat echo 10/17 showed improved EF to 60%, seen by Dr. Aundra Dubin who cleared her to resume herceptin. She feels well in general. Continues working and exercise. Tolerating tamoxifen without significant side effects. She gets hot but doesn't blame it on that. She plans to go to [place] to see her family in March 2024.  All other systems were reviewed with the patient and are negative.  MEDICAL HISTORY:  Past Medical History:  Diagnosis Date   Allergy 11/1998   Penicillin   Chicken pox    History of transfusion of whole blood    Hyperlipidemia    Malignant neoplasm of upper-outer quadrant of right breast in female, estrogen receptor positive ([place]) 01/13/2021   Migraine     Thyroid disease    Hypothyroidism    SURGICAL HISTORY: Past Surgical History:  Procedure Laterality Date   ABDOMINOPLASTY  10/01/2010   CESAREAN SECTION     twice   HERNIA REPAIR     PORTACATH PLACEMENT Right 01/23/2021   Procedure: Right internal jugular 8 [place] PowerPort with C arm and ultrasound guidance;  Surgeon: Erroll Luna, MD;  Location: [place];  Service: General;  Laterality: Right;   SENTINEL NODE BIOPSY Right 01/23/2021   Procedure: Right axillary sentinel lymph node mapping using Mag Trace;  Surgeon: Erroll Luna, MD;  Location: [place];  Service: General;  Laterality: Right;   SIMPLE MASTECTOMY WITH AXILLARY SENTINEL NODE BIOPSY Bilateral 01/23/2021   Procedure: Right simple mastectomy ;  Left risk reducing simple mastectomy;  Surgeon: Erroll Luna, MD;  Location: [place];  Service: General;  Laterality: Bilateral;   TUBAL LIGATION     umblica hernia repair  26/37/8588    I have reviewed the social history and family history with the patient and they are unchanged from previous note.  ALLERGIES:  is allergic to betadine [povidone iodine] and penicillins.  MEDICATIONS:  Current Outpatient Medications  Medication Sig Dispense Refill   amLODipine (NORVASC) 5 MG tablet Take 1 tablet (5 mg total) by mouth daily. 90 tablet 3   b complex vitamins capsule Take 1 capsule by mouth daily.     Calcium Citrate-Vitamin D (CALCIUM + D PO) Take 1 tablet by mouth daily.     Cholecalciferol (VITAMIN D  PO) Take 1 tablet by mouth daily.     levothyroxine (SYNTHROID) 25 MCG tablet TAKE 1 TABLET(25 MCG) BY MOUTH DAILY BEFORE BREAKFAST 90 tablet 3   Melatonin 10 MG CAPS Take 10 mg by mouth at bedtime.     metoprolol succinate (TOPROL-XL) 50 MG 24 hr tablet Take 1 tablet (50 mg total) by mouth daily. Take with or immediately following a meal. 90 tablet 3   Multiple Vitamins-Minerals (WOMENS MULTIVITAMIN PO) Take 1 tablet by mouth daily.     sacubitril-valsartan (ENTRESTO) 24-26 MG Take  1 tablet by mouth 2 (two) times daily. 180 tablet 3   simvastatin (ZOCOR) 20 MG tablet TAKE 1 TABLET(20 MG) BY MOUTH AT BEDTIME 90 tablet 3   tamoxifen (NOLVADEX) 20 MG tablet TAKE 1 TABLET(20 MG) BY MOUTH DAILY 30 tablet 3   traZODone (DESYREL) 50 MG tablet Take 0.5-1 tablets (25-50 mg total) by mouth at bedtime as needed for sleep. 90 tablet 3   No current facility-administered medications for this visit.    PHYSICAL EXAMINATION: ECOG PERFORMANCE STATUS: 0 - Asymptomatic  Vitals:   12/24/21 1236  BP: 127/83  Pulse: 88  Resp: 18  Temp: 98 F (36.7 C)  SpO2: 100%   Filed Weights   12/24/21 1236  Weight: 114 lb 1.6 oz (51.8 kg)    GENERAL:alert, no distress and comfortable SKIN: no rash  EYES: sclera clear LUNGS: normal breathing effort HEART:  no lower extremity edema NEURO: alert & oriented x 3 with fluent speech, nonfocal Breast exam deferred    LABORATORY DATA:  I have reviewed the data as listed    Latest Ref Rng & Units 11/12/2021    1:48 PM 08/20/2021   10:49 AM 06/20/2021    8:13 AM  CBC  WBC 4.0 - 10.5 K/uL 5.2  5.0  4.6   Hemoglobin 12.0 - 15.0 g/dL 11.5  11.6  10.5   Hematocrit 36.0 - 46.0 % 33.8  33.7  31.3   Platelets 150 - 400 K/uL 195  185  217         Latest Ref Rng & Units 11/12/2021    1:48 PM 11/03/2021   11:52 AM 08/20/2021   10:49 AM  CMP  Glucose 70 - 99 mg/dL 122  102  125   BUN 6 - 20 mg/dL 13  13  13    Creatinine 0.44 - 1.00 mg/dL 0.80  0.71  0.70   Sodium 135 - 145 mmol/L 142  140  140   Potassium 3.5 - 5.1 mmol/L 4.1  3.9  3.9   Chloride 98 - 111 mmol/L 113  107  106   CO2 22 - 32 mmol/L 24  26  29    Calcium 8.9 - 10.3 mg/dL 9.6  9.1  9.8   Total Protein 6.5 - 8.1 g/dL 6.7   7.0   Total Bilirubin 0.3 - 1.2 mg/dL 0.3   0.3   Alkaline Phos 38 - 126 U/L 50   55   AST 15 - 41 U/L 21   17   ALT 0 - 44 U/L 10   6       RADIOGRAPHIC STUDIES: B  ECHOCARDIOGRAM COMPLETE  Result Date: 12/23/2021    ECHOCARDIOGRAM REPORT   Patient  Name:   Dana Kramer  Date of Exam: 12/23/2021 Medical Rec #:  [id number]  Height:       60.0 in Accession #:    [id number] Weight:       115.5 lb Date  of Birth:  1966/01/21  BSA:          1.478 m Patient Age:    8 years   BP:           158/96 mmHg Patient Gender: F          HR:           73 bpm. Exam Location:  Outpatient Procedure: 2D Echo, Cardiac Doppler, Color Doppler and Strain Analysis Indications:    Chemo Z09                 Cardiomyopathy-Unspecified I42.9  History:        Patient has prior history of Echocardiogram examinations, most                 recent 10/21/2021. Risk Factors:Dyslipidemia.  Sonographer:    Bernadene Person RDCS Referring Phys: [place]  1. Left ventricular ejection fraction, by estimation, is 60%. The left ventricle has normal function. The left ventricle has no regional wall motion abnormalities. Left ventricular diastolic parameters are consistent with Grade I diastolic dysfunction (impaired relaxation). The average left ventricular global longitudinal strain is -18.2 %. The global longitudinal strain is normal.  2. Right ventricular systolic function is normal. The right ventricular size is normal. Tricuspid regurgitation signal is inadequate for assessing PA pressure.  3. The mitral valve is normal in structure. Trivial mitral valve regurgitation. No evidence of mitral stenosis.  4. The aortic valve is tricuspid. Aortic valve regurgitation is not visualized. No aortic stenosis is present.  5. The inferior vena cava is normal in size with greater than 50% respiratory variability, suggesting right atrial pressure of 3 mmHg. FINDINGS  Left Ventricle: Left ventricular ejection fraction, by estimation, is 60%. The left ventricle has normal function. The left ventricle has no regional wall motion abnormalities. The average left ventricular global longitudinal strain is -18.2 %. The global longitudinal strain is normal. The left ventricular internal cavity size was  normal in size. There is no left ventricular hypertrophy. Left ventricular diastolic parameters are consistent with Grade I diastolic dysfunction (impaired relaxation). Right Ventricle: The right ventricular size is normal. No increase in right ventricular wall thickness. Right ventricular systolic function is normal. Tricuspid regurgitation signal is inadequate for assessing PA pressure. Left Atrium: Left atrial size was normal in size. Right Atrium: Right atrial size was normal in size. Pericardium: There is no evidence of pericardial effusion. Mitral Valve: The mitral valve is normal in structure. Trivial mitral valve regurgitation. No evidence of mitral valve stenosis. Tricuspid Valve: The tricuspid valve is normal in structure. Tricuspid valve regurgitation is not demonstrated. Aortic Valve: The aortic valve is tricuspid. Aortic valve regurgitation is not visualized. No aortic stenosis is present. Pulmonic Valve: The pulmonic valve was normal in structure. Pulmonic valve regurgitation is not visualized. Aorta: The aortic root is normal in size and structure. Venous: The inferior vena cava is normal in size with greater than 50% respiratory variability, suggesting right atrial pressure of 3 mmHg. IAS/Shunts: No atrial level shunt detected by color flow Doppler.  LEFT VENTRICLE PLAX 2D LVIDd:         4.10 cm     Diastology LVIDs:         2.70 cm     LV e' medial:    6.74 cm/s LV PW:         0.90 cm     LV E/e' medial:  9.7 LV IVS:  1.00 cm     LV e' lateral:   7.22 cm/s LVOT diam:     2.10 cm     LV E/e' lateral: 9.1 LV SV:         58 LV SV Index:   39          2D Longitudinal Strain LVOT Area:     3.46 cm    2D Strain GLS Avg:     -18.2 %  LV Volumes (MOD) LV vol d, MOD A2C: 80.4 ml LV vol d, MOD A4C: 82.2 ml LV vol s, MOD A2C: 32.9 ml LV vol s, MOD A4C: 32.2 ml LV SV MOD A2C:     47.5 ml LV SV MOD A4C:     82.2 ml LV SV MOD BP:      50.3 ml RIGHT VENTRICLE RV S prime:     8.51 cm/s TAPSE (M-mode): 1.7 cm  LEFT ATRIUM             Index        RIGHT ATRIUM           Index LA diam:        3.10 cm 2.10 cm/m   RA Area:     10.70 cm LA Vol (A2C):   31.7 ml 21.45 ml/m  RA Volume:   22.30 ml  15.09 ml/m LA Vol (A4C):   23.1 ml 15.63 ml/m LA Biplane Vol: 27.3 ml 18.47 ml/m  AORTIC VALVE LVOT Vmax:   85.40 cm/s LVOT Vmean:  53.500 cm/s LVOT VTI:    0.168 m  AORTA Ao Root diam: 3.40 cm Ao Asc diam:  3.40 cm MITRAL VALVE MV Area (PHT): 4.49 cm    SHUNTS MV Decel Time: 169 msec    Systemic VTI:  0.17 m MV E velocity: 65.50 cm/s  Systemic Diam: 2.10 cm MV A velocity: 68.30 cm/s MV E/A ratio:  0.96 Dalton McleanMD Electronically signed by Franki Monte Signature Date/Time: 12/23/2021/11:15:57 AM    Final      ASSESSMENT & PLAN: indu Floor is a 56 y.o. female with    1. Malignant neoplasm of upper-outer quadrant of right breast, Stage IA, c(T1bN0M0), Triple positive, Grade 2 -screening detected 0.7 cm upper-outer right breast mass with associated calcifications extending 1.5 cm. No abnormal right lymph nodes or left malignancy. 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 -S/p b/l mastectomies on 01/23/21 with Dr. Brantley Stage. 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  Port was placed during surgery -Due to Her2+ disease adjuvant chemotherapy was recommended, she began weekly Abraxane/Herceptin x12 weeks on 02/25/21. She completed chemo and tolerated it very well. She is currently on maintenance Herceptin every 3 weeks to total a year.  -She does not need post-mastectomy radiation.  -she began adjuvant Tamoxifen in 06/2021, plan for 10 years  -Baseline EF 60-65% with impaired relaxation (grade I diastolic dysfunction).   -Herceptin has been on hold x2 cycles for reduced EF 50-55%; echo yesterday shows improvement to 60%. Dr. Aundra Dubin has cleared her to resume herceptin -Ms. Reyez appears stable. She continues to tolerate tamoxifen and herceptin well, with no significant  side effects.  -Proceed with herceptin inj today as planned, continue q3 weeks -f/up in 9 weeks after next echo in December -On a side note she wants to travel to [place] to see family in 3/024. We discussed risk of thrombosis on tamoxifen and with prolonged travel. Will re-address closer to her trip  2. Genetics  -negative, 01/15/21    PLAN: -Echo reviewed, improved OK to resume herceptin -Herceptin inj today, q3 weeks to complete 1 year -lab and f/up in 9 weeks -next echo in December  No problem-specific Assessment & Plan notes found for this encounter.   No orders of the defined types were placed in this encounter.  All questions were answered. The patient knows to call the clinic with any problems, questions or concerns. No barriers to learning were detected.     Alla Feeling, NP 12/24/21

## 2021-12-24 NOTE — Patient Instructions (Signed)
[facility]  Discharge Instructions: Thank you for choosing [facility] to provide your oncology and hematology care.   If you have a lab appointment with the [facility], please go directly to the [facility] and check in at the registration area.   Wear comfortable clothing and clothing appropriate for easy access to any Portacath or PICC line.   We strive to give you quality time with your provider. You may need to reschedule your appointment if you arrive late (15 or more minutes).  Arriving late affects you and other patients whose appointments are after yours.  Also, if you miss three or more appointments without notifying the office, you may be dismissed from the clinic at the provider's discretion.      For prescription refill requests, have your pharmacy contact our office and allow 72 hours for refills to be completed.    Today you received the following chemotherapy and/or immunotherapy agents: Hercepin Hylecta      To help prevent nausea and vomiting after your treatment, we encourage you to take your nausea medication as directed.  BELOW ARE SYMPTOMS THAT SHOULD BE REPORTED IMMEDIATELY: *FEVER GREATER THAN 100.4 F (38 C) OR HIGHER *CHILLS OR SWEATING *NAUSEA AND VOMITING THAT IS NOT CONTROLLED WITH YOUR NAUSEA MEDICATION *UNUSUAL SHORTNESS OF BREATH *UNUSUAL BRUISING OR BLEEDING *URINARY PROBLEMS (pain or burning when urinating, or frequent urination) *BOWEL PROBLEMS (unusual diarrhea, constipation, pain near the anus) TENDERNESS IN MOUTH AND THROAT WITH OR WITHOUT PRESENCE OF ULCERS (sore throat, sores in mouth, or a toothache) UNUSUAL RASH, SWELLING OR PAIN  UNUSUAL VAGINAL DISCHARGE OR ITCHING   Items with * indicate a potential emergency and should be followed up as soon as possible or go to the Emergency Department if any problems should occur.  Please show the CHEMOTHERAPY ALERT CARD or IMMUNOTHERAPY ALERT CARD at  check-in to the Emergency Department and triage nurse.  Should you have questions after your visit or need to cancel or reschedule your appointment, please contact [facility]  Dept: [phone]  and follow the prompts.  Office hours are 8:00 a.m. to 4:30 p.m. Monday - Friday. Please note that voicemails left after 4:00 p.m. may not be returned until the following business day.  We are closed weekends and major holidays. You have access to a nurse at all times for urgent questions. Please call the main number to the clinic Dept: [phone] and follow the prompts.   For any non-urgent questions, you may also contact your provider using MyChart. We now offer e-Visits for anyone 10 and older to request care online for non-urgent symptoms. For details visit [email or web address].   Also download the MyChart app! Go to the app store, search "MyChart", open the app, select [facility], and log in with your MyChart username and password.  Masks are optional in the cancer centers. If you would like for your care team to wear a mask while they are taking care of you, please let them know. You may have one support person who is at least 54 years old accompany you for your appointments.

## 2022-01-14 ENCOUNTER — Encounter: Payer: Self-pay | Admitting: *Deleted

## 2022-01-14 ENCOUNTER — Inpatient Hospital Stay: Payer: Managed Care, Other (non HMO) | Attending: Hematology

## 2022-01-14 VITALS — BP 123/90 | HR 87 | Temp 97.7°F | Resp 17 | Wt 113.8 lb

## 2022-01-14 DIAGNOSIS — Z5112 Encounter for antineoplastic immunotherapy: Secondary | ICD-10-CM | POA: Insufficient documentation

## 2022-01-14 DIAGNOSIS — C50411 Malignant neoplasm of upper-outer quadrant of right female breast: Secondary | ICD-10-CM | POA: Insufficient documentation

## 2022-01-14 DIAGNOSIS — Z79899 Other long term (current) drug therapy: Secondary | ICD-10-CM | POA: Diagnosis not present

## 2022-01-14 DIAGNOSIS — Z170 Estrogen receptor positive status [ER+]: Secondary | ICD-10-CM | POA: Diagnosis not present

## 2022-01-14 MED ORDER — TRASTUZUMAB-HYALURONIDASE-OYSK 600-10000 MG-UNT/5ML ~~LOC~~ SOLN
600.0000 mg | Freq: Once | SUBCUTANEOUS | Status: AC
  Administered 2022-01-14: 600 mg via SUBCUTANEOUS
  Filled 2022-01-14: qty 5

## 2022-01-14 MED ORDER — DIPHENHYDRAMINE HCL 25 MG PO CAPS
25.0000 mg | ORAL_CAPSULE | Freq: Once | ORAL | Status: AC
  Administered 2022-01-14: 25 mg via ORAL
  Filled 2022-01-14: qty 1

## 2022-01-14 MED ORDER — ACETAMINOPHEN 325 MG PO TABS
650.0000 mg | ORAL_TABLET | Freq: Once | ORAL | Status: AC
  Administered 2022-01-14: 650 mg via ORAL
  Filled 2022-01-14: qty 2

## 2022-01-20 ENCOUNTER — Ambulatory Visit: Payer: Managed Care, Other (non HMO) | Attending: Surgery | Admitting: Rehabilitation

## 2022-01-20 ENCOUNTER — Encounter: Payer: Self-pay | Admitting: Rehabilitation

## 2022-01-20 DIAGNOSIS — Z483 Aftercare following surgery for neoplasm: Secondary | ICD-10-CM | POA: Insufficient documentation

## 2022-01-20 NOTE — Therapy (Signed)
  OUTPATIENT PHYSICAL THERAPY [facility] SCREENING NOTE   Patient Name: Dana Kramer MRN: [id number] DOB:[birth year], 56 y.o., female Today's Date: 01/20/2022  PCP: Darreld Mclean, MD REFERRING PROVIDER: Erroll Luna, MD   PT End of Session - 01/20/22 1152     Visit Number 11   screen only   PT Start Time 5809    PT Stop Time 1202    PT Time Calculation (min) 7 min    Activity Tolerance Patient tolerated treatment well    Behavior During Therapy [facility] for tasks assessed/performed             Past Medical History:  Diagnosis Date   Allergy 11/1998   Penicillin   Chicken pox    History of transfusion of whole blood    Hyperlipidemia    Malignant neoplasm of upper-outer quadrant of right breast in female, estrogen receptor positive ([place]) 01/13/2021   Migraine    Thyroid disease    Hypothyroidism   Past Surgical History:  Procedure Laterality Date   ABDOMINOPLASTY  10/01/2010   CESAREAN SECTION     twice   HERNIA REPAIR     PORTACATH PLACEMENT Right 01/23/2021   Procedure: Right internal jugular 8 [place] PowerPort with C arm and ultrasound guidance;  Surgeon: Erroll Luna, MD;  Location: [place];  Service: General;  Laterality: Right;   SENTINEL NODE BIOPSY Right 01/23/2021   Procedure: Right axillary sentinel lymph node mapping using Mag Trace;  Surgeon: Erroll Luna, MD;  Location: [facility];  Service: General;  Laterality: Right;   SIMPLE MASTECTOMY WITH AXILLARY SENTINEL NODE BIOPSY Bilateral 01/23/2021   Procedure: Right simple mastectomy ;  Left risk reducing simple mastectomy;  Surgeon: Erroll Luna, MD;  Location: [facility];  Service: General;  Laterality: Bilateral;   TUBAL LIGATION     umblica hernia repair  98/33/8250   Patient Active Problem List   Diagnosis Date Noted   Port-A-Cath in place 03/04/2021   Genetic testing 01/27/2021   S/P mastectomy, bilateral 01/23/2021   Family history of colon cancer 01/15/2021   Malignant neoplasm of upper-outer quadrant  of right breast in female, estrogen receptor positive ([place]) 01/13/2021   Pre-diabetes 11/06/2020   Acquired hypothyroidism 04/14/2018   Migraine 01/18/2015   Insomnia 11/05/2011   Anxiety 11/05/2011   Hyperlipidemia 11/05/2011   Tachycardia 53/97/6734   Umbilical hernia 19/37/9024    REFERRING DIAG: right breast cancer at risk for lymphedema  THERAPY DIAG:  Aftercare following surgery for neoplasm  PRECAUTIONS: right UE Lymphedema risk,   SUBJECTIVE: Doing well lately  PAIN:  Are you having pain? No  SOZO SCREENING: Patient was assessed today using the SOZO machine to determine the lymphedema index score. This was compared to her baseline score. It was determined that she is within the recommended range when compared to her baseline and no further action is needed at this time. She will continue SOZO screenings. These are done every 3 months for 2 years post operatively followed by every 6 months for 2 years, and then annually.   L-DEX LYMPHEDEMA SCREENING Measurement Type: Unilateral L-DEX MEASUREMENT EXTREMITY: Upper Extremity POSITION : Standing DOMINANT SIDE: Right At Risk Side: Right BASELINE SCORE (UNILATERAL): -4.5 L-DEX SCORE (UNILATERAL): -5.4 VALUE CHANGE (UNILAT): -0.9  Yilia Sacca, Adrian Prince, PT 01/20/2022, 12:20 PM

## 2022-02-01 ENCOUNTER — Other Ambulatory Visit: Payer: Self-pay | Admitting: Family Medicine

## 2022-02-01 DIAGNOSIS — I10 Essential (primary) hypertension: Secondary | ICD-10-CM

## 2022-02-04 ENCOUNTER — Inpatient Hospital Stay: Payer: Managed Care, Other (non HMO)

## 2022-02-04 VITALS — BP 142/98 | HR 87 | Temp 98.3°F | Resp 18 | Wt 112.8 lb

## 2022-02-04 DIAGNOSIS — Z5112 Encounter for antineoplastic immunotherapy: Secondary | ICD-10-CM | POA: Diagnosis not present

## 2022-02-04 DIAGNOSIS — C50411 Malignant neoplasm of upper-outer quadrant of right female breast: Secondary | ICD-10-CM

## 2022-02-04 MED ORDER — ACETAMINOPHEN 325 MG PO TABS
650.0000 mg | ORAL_TABLET | Freq: Once | ORAL | Status: AC
  Administered 2022-02-04: 650 mg via ORAL
  Filled 2022-02-04: qty 2

## 2022-02-04 MED ORDER — TRASTUZUMAB-HYALURONIDASE-OYSK 600-10000 MG-UNT/5ML ~~LOC~~ SOLN
600.0000 mg | Freq: Once | SUBCUTANEOUS | Status: AC
  Administered 2022-02-04: 600 mg via SUBCUTANEOUS
  Filled 2022-02-04: qty 5

## 2022-02-04 MED ORDER — DIPHENHYDRAMINE HCL 25 MG PO CAPS
25.0000 mg | ORAL_CAPSULE | Freq: Once | ORAL | Status: AC
  Administered 2022-02-04: 25 mg via ORAL
  Filled 2022-02-04: qty 1

## 2022-02-04 NOTE — Patient Instructions (Signed)
[facility] MEDICAL ONCOLOGY  Discharge Instructions: Thank you for choosing [facility] to provide your oncology and hematology care.   If you have a lab appointment with the Cancer Center, please go directly to the Cancer Center and check in at the registration area.   Wear comfortable clothing and clothing appropriate for easy access to any Portacath or PICC line.   We strive to give you quality time with your provider. You may need to reschedule your appointment if you arrive late (15 or more minutes).  Arriving late affects you and other patients whose appointments are after yours.  Also, if you miss three or more appointments without notifying the office, you may be dismissed from the clinic at the provider's discretion.      For prescription refill requests, have your pharmacy contact our office and allow 72 hours for refills to be completed.    Today you received the following chemotherapy and/or immunotherapy agents: herceptin hylecta      To help prevent nausea and vomiting after your treatment, we encourage you to take your nausea medication as directed.  BELOW ARE SYMPTOMS THAT SHOULD BE REPORTED IMMEDIATELY: *FEVER GREATER THAN 100.4 F (38 C) OR HIGHER *CHILLS OR SWEATING *NAUSEA AND VOMITING THAT IS NOT CONTROLLED WITH YOUR NAUSEA MEDICATION *UNUSUAL SHORTNESS OF BREATH *UNUSUAL BRUISING OR BLEEDING *URINARY PROBLEMS (pain or burning when urinating, or frequent urination) *BOWEL PROBLEMS (unusual diarrhea, constipation, pain near the anus) TENDERNESS IN MOUTH AND THROAT WITH OR WITHOUT PRESENCE OF ULCERS (sore throat, sores in mouth, or a toothache) UNUSUAL RASH, SWELLING OR PAIN  UNUSUAL VAGINAL DISCHARGE OR ITCHING   Items with * indicate a potential emergency and should be followed up as soon as possible or go to the Emergency Department if any problems should occur.  Please show the CHEMOTHERAPY ALERT CARD or IMMUNOTHERAPY ALERT CARD at  check-in to the Emergency Department and triage nurse.  Should you have questions after your visit or need to cancel or reschedule your appointment, please contact [facility]COL[facility]  Dept: [phone]  and follow the prompts.  Office hours are 8:00 a.m. to 4:30 p.m. Monday - Friday. Please note that voicemails left after 4:00 p.m. may not be returned until the following business day.  We are closed weekends and major holidays. You have access to a nurse at all times for urgent questions. Please call the main number to the clinic Dept: [phone] and follow the prompts.   For any non-urgent questions, you may also contact your provider using MyChart. We now offer e-Visits for anyone 18 and older to request care online for non-urgent symptoms. For details visit [email or web address].   Also download the MyChart app! Go to the app store, search "MyChart", open the app, select [facility], and log in with your MyChart username and password.  Masks are optional in the cancer centers. If you would like for your care team to wear a mask while they are taking care of you, please let them know. You may have one support person who is at least 54 years old accompany you for your appointments. 

## 2022-02-24 ENCOUNTER — Encounter: Payer: Self-pay | Admitting: *Deleted

## 2022-02-24 ENCOUNTER — Encounter (HOSPITAL_COMMUNITY): Payer: Self-pay | Admitting: Cardiology

## 2022-02-24 ENCOUNTER — Other Ambulatory Visit: Payer: Self-pay

## 2022-02-24 ENCOUNTER — Ambulatory Visit (HOSPITAL_COMMUNITY)
Admission: RE | Admit: 2022-02-24 | Discharge: 2022-02-24 | Disposition: A | Discharge status: Home / Self Care | Payer: Managed Care, Other (non HMO) | Source: Ambulatory Visit | Attending: Family Medicine | Admitting: Family Medicine

## 2022-02-24 ENCOUNTER — Ambulatory Visit (HOSPITAL_COMMUNITY)
Admission: RE | Admit: 2022-02-24 | Discharge: 2022-02-24 | Disposition: A | Discharge status: Home / Self Care | Payer: Managed Care, Other (non HMO) | Source: Ambulatory Visit | Attending: Cardiology | Admitting: Cardiology

## 2022-02-24 VITALS — BP 120/80 | HR 72 | Wt 113.8 lb

## 2022-02-24 DIAGNOSIS — Z170 Estrogen receptor positive status [ER+]: Secondary | ICD-10-CM | POA: Diagnosis not present

## 2022-02-24 DIAGNOSIS — Z853 Personal history of malignant neoplasm of breast: Secondary | ICD-10-CM | POA: Insufficient documentation

## 2022-02-24 DIAGNOSIS — R000 Tachycardia, unspecified: Secondary | ICD-10-CM | POA: Diagnosis not present

## 2022-02-24 DIAGNOSIS — I5030 Unspecified diastolic (congestive) heart failure: Secondary | ICD-10-CM

## 2022-02-24 DIAGNOSIS — I517 Cardiomegaly: Secondary | ICD-10-CM | POA: Diagnosis not present

## 2022-02-24 DIAGNOSIS — C50411 Malignant neoplasm of upper-outer quadrant of right female breast: Secondary | ICD-10-CM | POA: Diagnosis not present

## 2022-02-24 DIAGNOSIS — I509 Heart failure, unspecified: Secondary | ICD-10-CM | POA: Diagnosis not present

## 2022-02-24 DIAGNOSIS — I77810 Thoracic aortic ectasia: Secondary | ICD-10-CM | POA: Diagnosis not present

## 2022-02-24 DIAGNOSIS — Z9013 Acquired absence of bilateral breasts and nipples: Secondary | ICD-10-CM | POA: Diagnosis not present

## 2022-02-24 DIAGNOSIS — I110 Hypertensive heart disease with heart failure: Secondary | ICD-10-CM | POA: Diagnosis not present

## 2022-02-24 DIAGNOSIS — E785 Hyperlipidemia, unspecified: Secondary | ICD-10-CM | POA: Insufficient documentation

## 2022-02-24 LAB — ECHOCARDIOGRAM COMPLETE
Calc EF: 51.1 %
MV M vel: 3.42 m/s
MV Peak grad: 46.6 mmHg
S' Lateral: 3.5 cm
Single Plane A2C EF: 52.9 %
Single Plane A4C EF: 50.2 %

## 2022-02-24 NOTE — Progress Notes (Signed)
ADVANCED HF CLINIC CONSULT NOTE  Referring Physician: Dr Burr Medico Primary Care:Dr Copland  Primary HF Cardiologist: Dr Aundra Dubin   HPI: Dana Kramer is a 56 y.o. initially referred to the Cardio-Oncology clinic by Dr Burr Medico for reduced EF. She has a history of breast cancer, s/p bilateral mastectomies 01/2021 , and HTN.   R breast Cancer, Stage IA (pT1c, pN0, cM0, G2, ER+, PR+, HER2+).  Paclitaxel + Trastuzumab q7d / Trastuzumab q21d . Maintenance Herceptin injection q3weeks, starting 06/20/21. Plan to continue Herceptin for total 12 months.   Echo 04/2021 EF 60-65%  Echo 07/2021 EF 60-65% 23.2%  Echo 10/2021 EF 50-55% GLS 22.3%  Herceptin held with drop in EF on 8/23 echo.  She was continued on Toprol XL and started on Entresto.  Echo in 10/23 showed EF 60% with GLS -18.2%.  Echo was done today and reviewed, EF 55% with mildly less negative GLS -19.9%, RV normal, IVC normal.   Patient has been getting Herceptin.  She has no complaints today.  Weight down 1 lb.  No exertional dyspnea or chest pain.    Labs (9/23): K 4.1, creatinine 0.8  Review of Systems: All systems reviewed and negative except as per HPI.  Past Medical History:  Diagnosis Date   Allergy 11/1998   Penicillin   Chicken pox    History of transfusion of whole blood    Hyperlipidemia    Malignant neoplasm of upper-outer quadrant of right breast in female, estrogen receptor positive ([place]) 01/13/2021   Migraine    Thyroid disease    Hypothyroidism    Current Outpatient Medications  Medication Sig Dispense Refill   amLODipine (NORVASC) 5 MG tablet Take 1 tablet (5 mg total) by mouth daily. 90 tablet 1   b complex vitamins capsule Take 1 capsule by mouth daily.     Calcium Citrate-Vitamin D (CALCIUM + D PO) Take 1 tablet by mouth daily.     Cholecalciferol (VITAMIN D PO) Take 1 tablet by mouth daily.     levothyroxine (SYNTHROID) 25 MCG tablet TAKE 1 TABLET(25 MCG) BY MOUTH DAILY BEFORE BREAKFAST 90 tablet 3   Melatonin 10 MG  CAPS Take 10 mg by mouth at bedtime.     metoprolol succinate (TOPROL-XL) 50 MG 24 hr tablet Take 1 tablet (50 mg total) by mouth daily. Take with or immediately following a meal. 90 tablet 3   Multiple Vitamins-Minerals (WOMENS MULTIVITAMIN PO) Take 1 tablet by mouth daily.     sacubitril-valsartan (ENTRESTO) 24-26 MG Take 1 tablet by mouth 2 (two) times daily. 180 tablet 3   simvastatin (ZOCOR) 20 MG tablet TAKE 1 TABLET(20 MG) BY MOUTH AT BEDTIME 90 tablet 3   tamoxifen (NOLVADEX) 20 MG tablet TAKE 1 TABLET(20 MG) BY MOUTH DAILY 30 tablet 3   traZODone (DESYREL) 50 MG tablet Take 0.5-1 tablets (25-50 mg total) by mouth at bedtime as needed for sleep. 90 tablet 3   No current facility-administered medications for this encounter.    Allergies  Allergen Reactions   Betadine [Povidone Iodine] Rash   Penicillins Rash    Mainly upper body only.      Social History   Socioeconomic History   Marital status: Married    Spouse name: Not on file   Number of children: 2   Years of education: Not on file   Highest education level: Not on file  Occupational History   Not on file  Tobacco Use   Smoking status: Never   Smokeless tobacco: Never  Vaping Use   Vaping Use: Never used  Substance and Sexual Activity   Alcohol use: Never   Drug use: Never   Sexual activity: Not Currently  Other Topics Concern   Not on file  Social History Narrative   Not on file   Social Determinants of Health   Financial Resource Strain: Not on file  Food Insecurity: Not on file  Transportation Needs: Not on file  Physical Activity: Not on file  Stress: Not on file  Social Connections: Not on file  Intimate Partner Violence: Not on file      Family History  Problem Relation Age of Onset   Hypertension Mother    Cancer Father 52       esophageal cancer   Leukemia Maternal Grandfather        dx. >50   Colon cancer Paternal Grandfather        dx. >50   Lung cancer Cousin        dx. 16s, did  not smoke    Vitals:   02/24/22 1113  BP: 120/80  Pulse: 72  SpO2: 98%  Weight: 51.6 kg (113 lb 12.8 oz)    PHYSICAL EXAM: General: NAD Neck: No JVD, no thyromegaly or thyroid nodule.  Lungs: Clear to auscultation bilaterally with normal respiratory effort. CV: Nondisplaced PMI.  Heart regular S1/S2, no S3/S4, no murmur.  No peripheral edema.  No carotid bruit.  Normal pedal pulses.  Abdomen: Soft, nontender, no hepatosplenomegaly, no distention.  Skin: Intact without lesions or rashes.  Neurologic: Alert and oriented x 3.  Psych: Normal affect. Extremities: No clubbing or cyanosis.  HEENT: Normal.  ASSESSMENT & PLAN:  1. Breast Cancer: Started herceptin 02/2021. Echo in 8/23 with EF down to 50-55%.  Herceptin was held and she was started on Entresto + Toprol XL.  Echo in 10/23 showed improvement in EF to 60%.  Echo today was reviewed, EF 55% with mildly less negative strain at -15.9%.  NYHA class I, not volume overloaded on exam.  - Strain is a bit less negative, but she only has a month or so of Herceptin left per her report. Will continue for now, and will arrange for echo in 3 months (presumably she will be off Herceptin at that point.  - Continue current Toprol XL and Entresto.  - Echo + followup in 3 months.  2. HTN: BP under better control since starting Entresto.   Followup 3 months with echo.   Dana Kramer 02/24/2022

## 2022-02-24 NOTE — Patient Instructions (Signed)
There has been no changes to your medication.  Your physician has requested that you have an echocardiogram. Echocardiography is a painless test that uses sound waves to create images of your heart. It provides your doctor with information about the size and shape of your heart and how well your heart's chambers and valves are working. This procedure takes approximately one hour. There are no restrictions for this procedure. Please do NOT wear cologne, perfume, aftershave, or lotions (deodorant is allowed). Please arrive 15 minutes prior to your appointment time.  Your physician recommends that you schedule a follow-up appointment in: 3 months   If you have any questions or concerns before your next appointment please send [place] a message through [facility] or call our office at [phone].    TO LEAVE A MESSAGE FOR THE NURSE SELECT OPTION 2, PLEASE LEAVE A MESSAGE INCLUDING: YOUR NAME DATE OF BIRTH CALL BACK NUMBER REASON FOR CALL**this is important as we prioritize the call backs  YOU WILL RECEIVE A CALL BACK THE SAME DAY AS LONG AS YOU CALL BEFORE 4:00 PM  At the [facility], you and your health needs are our priority. As part of our continuing mission to provide you with exceptional heart care, we have created designated Provider Care Teams. These Care Teams include your primary Cardiologist (physician) and Advanced Practice Providers (APPs- Physician Assistants and Nurse Practitioners) who all work together to provide you with the care you need, when you need it.   You may see any of the following providers on your designated Care Team at your next follow up: Dr Glori Bickers Dr [name] Dr. Roxana Hires, NP Lyda Jester, [place] [facility] Joshua, [place] Forestine Na, NP Audry Riles, PharmD   Please be sure to bring in all your medications bottles to every appointment.

## 2022-02-24 NOTE — Progress Notes (Signed)
  Echocardiogram 2D Echocardiogram has been performed.  Dana Kramer 02/24/2022, 11:18 AM

## 2022-02-25 ENCOUNTER — Inpatient Hospital Stay: Payer: Managed Care, Other (non HMO)

## 2022-02-25 ENCOUNTER — Inpatient Hospital Stay: Payer: Managed Care, Other (non HMO) | Attending: Hematology | Admitting: Hematology

## 2022-02-25 ENCOUNTER — Other Ambulatory Visit: Payer: Self-pay

## 2022-02-25 ENCOUNTER — Encounter: Payer: Self-pay | Admitting: Hematology

## 2022-02-25 ENCOUNTER — Inpatient Hospital Stay: Payer: Managed Care, Other (non HMO) | Attending: Hematology

## 2022-02-25 DIAGNOSIS — Z170 Estrogen receptor positive status [ER+]: Secondary | ICD-10-CM

## 2022-02-25 DIAGNOSIS — C50411 Malignant neoplasm of upper-outer quadrant of right female breast: Secondary | ICD-10-CM | POA: Diagnosis present

## 2022-02-25 DIAGNOSIS — Z9013 Acquired absence of bilateral breasts and nipples: Secondary | ICD-10-CM | POA: Insufficient documentation

## 2022-02-25 DIAGNOSIS — Z79899 Other long term (current) drug therapy: Secondary | ICD-10-CM | POA: Diagnosis not present

## 2022-02-25 DIAGNOSIS — Z79810 Long term (current) use of selective estrogen receptor modulators (SERMs): Secondary | ICD-10-CM | POA: Diagnosis not present

## 2022-02-25 DIAGNOSIS — Z5112 Encounter for antineoplastic immunotherapy: Secondary | ICD-10-CM | POA: Diagnosis present

## 2022-02-25 LAB — CBC WITH DIFFERENTIAL (CANCER CENTER ONLY)
Abs Immature Granulocytes: 0.01 10*3/uL (ref 0.00–0.07)
Basophils Absolute: 0 10*3/uL (ref 0.0–0.1)
Basophils Relative: 0 %
Eosinophils Absolute: 0.1 10*3/uL (ref 0.0–0.5)
Eosinophils Relative: 2 %
HCT: 34.1 % — ABNORMAL LOW (ref 36.0–46.0)
Hemoglobin: 11.7 g/dL — ABNORMAL LOW (ref 12.0–15.0)
Immature Granulocytes: 0 %
Lymphocytes Relative: 34 %
Lymphs Abs: 2.4 10*3/uL (ref 0.7–4.0)
MCH: 29.4 pg (ref 26.0–34.0)
MCHC: 34.3 g/dL (ref 30.0–36.0)
MCV: 85.7 fL (ref 80.0–100.0)
Monocytes Absolute: 0.4 10*3/uL (ref 0.1–1.0)
Monocytes Relative: 5 %
Neutro Abs: 4.1 10*3/uL (ref 1.7–7.7)
Neutrophils Relative %: 59 %
Platelet Count: 219 10*3/uL (ref 150–400)
RBC: 3.98 MIL/uL (ref 3.87–5.11)
RDW: 11.5 % (ref 11.5–15.5)
WBC Count: 7 10*3/uL (ref 4.0–10.5)
nRBC: 0 % (ref 0.0–0.2)

## 2022-02-25 LAB — CMP (CANCER CENTER ONLY)
ALT: 8 U/L (ref 0–44)
AST: 22 U/L (ref 15–41)
Albumin: 4.2 g/dL (ref 3.5–5.0)
Alkaline Phosphatase: 55 U/L (ref 38–126)
Anion gap: 5 (ref 5–15)
BUN: 10 mg/dL (ref 6–20)
CO2: 28 mmol/L (ref 22–32)
Calcium: 10.2 mg/dL (ref 8.9–10.3)
Chloride: 105 mmol/L (ref 98–111)
Creatinine: 0.79 mg/dL (ref 0.44–1.00)
GFR, Estimated: >60 mL/min (ref >60)
Glucose, Bld: 128 mg/dL — ABNORMAL HIGH (ref 70–99)
Potassium: 4.2 mmol/L (ref 3.5–5.1)
Sodium: 138 mmol/L (ref 135–145)
Total Bilirubin: 0.4 mg/dL (ref 0.3–1.2)
Total Protein: 6.9 g/dL (ref 6.5–8.1)

## 2022-02-25 MED ORDER — TAMOXIFEN CITRATE 20 MG PO TABS: ORAL_TABLET | ORAL | 1 refills | Status: DC

## 2022-02-25 MED ORDER — TRASTUZUMAB-HYALURONIDASE-OYSK 600-10000 MG-UNT/5ML ~~LOC~~ SOLN
600.0000 mg | Freq: Once | SUBCUTANEOUS | Status: AC
  Administered 2022-02-25: 600 mg via SUBCUTANEOUS
  Filled 2022-02-25: qty 5

## 2022-02-25 MED ORDER — ACETAMINOPHEN 325 MG PO TABS
650.0000 mg | ORAL_TABLET | Freq: Once | ORAL | Status: AC
  Administered 2022-02-25: 650 mg via ORAL
  Filled 2022-02-25: qty 2

## 2022-02-25 MED ORDER — DIPHENHYDRAMINE HCL 25 MG PO CAPS
25.0000 mg | ORAL_CAPSULE | Freq: Once | ORAL | Status: AC
  Administered 2022-02-25: 25 mg via ORAL
  Filled 2022-02-25: qty 1

## 2022-02-25 NOTE — Patient Instructions (Signed)
[facility] ONCOLOGY  Discharge Instructions: Thank you for choosing [facility] to provide your oncology and hematology care.   If you have a lab appointment with the [facility], please go directly to the [facility] and check in at the registration area.   Wear comfortable clothing and clothing appropriate for easy access to any Portacath or PICC line.   We strive to give you quality time with your provider. You may need to reschedule your appointment if you arrive late (15 or more minutes).  Arriving late affects you and other patients whose appointments are after yours.  Also, if you miss three or more appointments without notifying the office, you may be dismissed from the clinic at the provider's discretion.      For prescription refill requests, have your pharmacy contact our office and allow 72 hours for refills to be completed.    Today you received the following chemotherapy and/or immunotherapy agents: trastuzumab-hyaluronidase-oysk      To help prevent nausea and vomiting after your treatment, we encourage you to take your nausea medication as directed.  BELOW ARE SYMPTOMS THAT SHOULD BE REPORTED IMMEDIATELY: *FEVER GREATER THAN 100.4 F (38 C) OR HIGHER *CHILLS OR SWEATING *NAUSEA AND VOMITING THAT IS NOT CONTROLLED WITH YOUR NAUSEA MEDICATION *UNUSUAL SHORTNESS OF BREATH *UNUSUAL BRUISING OR BLEEDING *URINARY PROBLEMS (pain or burning when urinating, or frequent urination) *BOWEL PROBLEMS (unusual diarrhea, constipation, pain near the anus) TENDERNESS IN MOUTH AND THROAT WITH OR WITHOUT PRESENCE OF ULCERS (sore throat, sores in mouth, or a toothache) UNUSUAL RASH, SWELLING OR PAIN  UNUSUAL VAGINAL DISCHARGE OR ITCHING   Items with * indicate a potential emergency and should be followed up as soon as possible or go to the Emergency Department if any problems should occur.  Please show the CHEMOTHERAPY ALERT CARD or IMMUNOTHERAPY ALERT  CARD at check-in to the Emergency Department and triage nurse.  Should you have questions after your visit or need to cancel or reschedule your appointment, please contact [facility]  Dept: [phone]  and follow the prompts.  Office hours are 8:00 a.m. to 4:30 p.m. Monday - Friday. Please note that voicemails left after 4:00 p.m. may not be returned until the following business day.  We are closed weekends and major holidays. You have access to a nurse at all times for urgent questions. Please call the main number to the clinic Dept: [phone] and follow the prompts.   For any non-urgent questions, you may also contact your provider using MyChart. We now offer e-Visits for anyone 44 and older to request care online for non-urgent symptoms. For details visit [email or web address].   Also download the MyChart app! Go to the app store, search "MyChart", open the app, select [facility] Kramer, and log in with your MyChart username and password.  Masks are optional in the cancer centers. If you would like for your care team to wear a mask while they are taking care of you, please let them know. You may have one support person who is at least 54 years old accompany you for your appointments.

## 2022-02-25 NOTE — Progress Notes (Signed)
[place]   [phone] [phone]   Clinic Follow up Note   Patient Care Team: Copland, Gay Filler, MD as PCP - General (Family Medicine) Erroll Luna, MD as Consulting Physician (General Surgery) Truitt Merle, MD as Consulting Physician (Hematology) Eppie Gibson, MD as Attending Physician (Radiation Oncology) Mauro Kaufmann, RN as Oncology Nurse Navigator [name], RN as Oncology Nurse Navigator Bensimhon, Shaune Pascal, MD as Consulting Physician (Cardiology)  Date of Service:  02/25/2022  CHIEF COMPLAINT: f/u of  right breast cancer    CURRENT THERAPY:  -Herceptin, starting 02/25/21, currently maintenance injection q21d -tamoxifen, started 06/20/21  ASSESSMENT:  Dana Kramer is a 56 y.o. female with   1. Malignant neoplasm of upper-outer quadrant of right breast, Stage IA, c(T1bN0M0), Triple positive, Grade 2 -found on screening mammogram. 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 -S/p b/l mastectomies on 01/23/21 with Dr. Brantley Stage. 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 Port was placed during surgery -Baseline EF 60-65% with impaired relaxation (grade I diastolic dysfunction). Will monitor q3 months on herceptin -s/p weekly Abraxane/Herceptin x12 02/25/21 - 05/12/21, -she began maintenance herceptin injection on 06/20/21. She is tolerating well. -she began tamoxifen on 06/20/21. She is tolerating well overall. She notes she has not had a period since completing chemo. We will check her hormone levels in 1-2 years and we can consider switching her to AI when she became postmenopausal. -repeat echo 10/21/21 showed slightly lower EF at 50-55%. She was seen by cardiologist and started on entresto and trastuzumab was held for a few months and restarted after improved EF on follow-ups echo. -She is clinically doing well, recent echo showed slightly decreased EF again, 55%, given she only has 3  treatments left, will continue and finish at the end of January. -She will have a repeated echo in 3 months.   PLAN: -lab reviewed  - Proceed  with Herceptin Hylecta injection today and continue every 3 weeks for 2 more doses. -I refill Tamoxifen -f/u in 10mhs      SUMMARY OF ONCOLOGIC HISTORY: Oncology History Overview Note   Cancer Staging  Malignant neoplasm of upper-outer quadrant of right breast in female, estrogen receptor positive ([place] Staging form: Breast, AJCC 8th Edition - Clinical stage from 01/08/2021: Stage IA (cT1b, cN0, cM0, G2, ER+, PR+, HER2+) - Signed by FTruitt Merle MD on 01/15/2021 - Pathologic stage from 01/23/2021: Stage IA (pT1c, pN0, cM0, G2, ER+, PR+, HER2+) - Signed by FTruitt Merle MD on 02/13/2021     Malignant neoplasm of upper-outer quadrant of right breast in female, estrogen receptor positive ([place]  01/02/2021 Mammogram   EXAM: DIGITAL DIAGNOSTIC BILATERAL MAMMOGRAM WITH TOMOSYNTHESIS AND CAD; ULTRASOUND LEFT BREAST LIMITED; ULTRASOUND RIGHT BREAST LIMITED  IMPRESSION: 1. Highly suspicious 0.7 cm UPPER-OUTER RIGHT breast mass with associated suspicious calcifications extending 1.5 cm anteriorly. Tissue sampling is recommended. 2. No abnormal appearing RIGHT axillary lymph nodes. 3. Benign LOWER LEFT breast cyst corresponding to the LEFT breast screening study finding.   01/08/2021 Cancer Staging   Staging form: Breast, AJCC 8th Edition - Clinical stage from 01/08/2021: Stage IA (cT1b, cN0, cM0, G2, ER+, PR+, HER2+) - Signed by FTruitt Merle MD on 01/15/2021 Stage prefix: Initial diagnosis Nuclear grade: G2 Histologic grading system: 3 grade system   01/08/2021 Pathology Results   Diagnosis Breast, right, needle core biopsy, upper outer quadrant, 11 o'clock, 5cmfn, ribbon clip - INVASIVE DUCTAL CARCINOMA - DUCTAL CARCINOMA IN SITU  WITH CALCIFICATIONS - SEE COMMENT Microscopic Comment Based on the biopsy, the carcinoma appears Nottingham grade 2 of 3  and measures 0.6 cm in greatest linear extent.  PROGNOSTIC INDICATORS Results: The tumor cells are 2+ for Her2 (EQUIVOCAL). Her2 by FISH will be performed and results reported separately. Estrogen Receptor: 100%, POSITIVE, STRONG STAINING INTENSITY Progesterone Receptor: 70%, POSITIVE, STRONG STAINING INTENSITY Proliferation Marker Ki67: 10%  FLUORESCENCE IN-SITU HYBRIDIZATION Results: GROUP 1: HER2 **POSITIVE**   01/13/2021 Initial Diagnosis   Malignant neoplasm of upper-outer quadrant of right breast in female, estrogen receptor positive ([place])   01/15/2021 Genetic Testing   [facility] CustomNext Panel was Negative. Report date is 01/26/2021.  The CustomNext-Cancer+RNAinsight panel offered by [facility] includes sequencing and rearrangement analysis for the following 47 genes:  APC, ATM, AXIN2, BARD1, BMPR1A, BRCA1, BRCA2, BRIP1, CDH1, CDK4, CDKN2A, CHEK2, CTNNA1, DICER1, EPCAM, GREM1, HOXB13, KIT, MEN1, MLH1, MSH2, MSH3, MSH6, MUTYH, NBN, NF1, NTHL1, PALB2, PDGFRA, PMS2, POLD1, POLE, PTEN, RAD50, RAD51C, RAD51D, SDHA, SDHB, SDHC, SDHD, SMAD4, SMARCA4, STK11, TP53, TSC1, TSC2, and VHL.  RNA data is routinely analyzed for use in variant interpretation for all genes.   01/23/2021 Cancer Staging   Staging form: Breast, AJCC 8th Edition - Pathologic stage from 01/23/2021: Stage IA (pT1c, pN0, cM0, G2, ER+, PR+, HER2+) - Signed by Truitt Merle, MD on 02/13/2021 Histologic grading system: 3 grade system Residual tumor (R): R0 - None   01/23/2021 Definitive Surgery   FINAL MICROSCOPIC DIAGNOSIS:   A. BREAST, LEFT, MASTECTOMY:  - Fibrocystic changes with sclerosing adenosis and calcifications.  - Fibroadenoma (0.6 cm).  - No evidence of malignancy.   B. BREAST, RIGHT, MASTECTOMY:  - Invasive and in situ ductal carcinoma, 1.7 cm.  - Margins negative for carcinoma.  - Biopsy site and biopsy clip.  - See oncology table.   C. LYMPH NODE, RIGHT AXILLARY, SENTINEL, EXCISION:  - One lymph node  negative for metastatic carcinoma (0/1).   D. LYMPH NODE, RIGHT AXILLARY, SENTINEL, EXCISION:  - One lymph node negative for metastatic carcinoma (0/1).   E. LYMPH NODE, RIGHT AXILLARY, SENTINEL, EXCISION:  - One lymph node negative for metastatic carcinoma (0/1).   F. LYMPH NODE, RIGHT AXILLARY, SENTINEL, EXCISION:  - One lymph node negative for metastatic carcinoma (0/1).    02/25/2021 - 10/22/2021 Chemotherapy   Patient is on Treatment Plan : BREAST Paclitaxel + Trastuzumab q7d / Trastuzumab q21d     02/25/2021 -  Chemotherapy   Patient is on Treatment Plan : BREAST Paclitaxel + Trastuzumab q7d / Trastuzumab q21d        INTERVAL HISTORY:  Dana Kramer is here for a follow up ofright breast cancer   She was last seen by me on 11/12/21 She presents to the clinic accompanied by daughter. Pt has some fatigue. She also states that her appetite is not good. Pt states she is going out of town in March. He denies joint pain.      All other systems were reviewed with the patient and are negative.  MEDICAL HISTORY:  Past Medical History:  Diagnosis Date   Allergy 11/1998   Penicillin   Chicken pox    History of transfusion of whole blood    Hyperlipidemia    Malignant neoplasm of upper-outer quadrant of right breast in female, estrogen receptor positive ([place]) 01/13/2021   Migraine    Thyroid disease    Hypothyroidism    SURGICAL HISTORY: Past Surgical History:  Procedure Laterality Date   ABDOMINOPLASTY  10/01/2010  CESAREAN SECTION     twice   HERNIA REPAIR     PORTACATH PLACEMENT Right 01/23/2021   Procedure: Right internal jugular 8 [place] PowerPort with C arm and ultrasound guidance;  Surgeon: Erroll Luna, MD;  Location: [place] [facility];  Service: General;  Laterality: Right;   SENTINEL NODE BIOPSY Right 01/23/2021   Procedure: Right axillary sentinel lymph node mapping using Mag Trace;  Surgeon: Erroll Luna, MD;  Location: [place];  Service: General;  Laterality: Right;    SIMPLE MASTECTOMY WITH AXILLARY SENTINEL NODE BIOPSY Bilateral 01/23/2021   Procedure: Right simple mastectomy ;  Left risk reducing simple mastectomy;  Surgeon: Erroll Luna, MD;  Location: [place];  Service: General;  Laterality: Bilateral;   TUBAL LIGATION     umblica hernia repair  14/48/1856    I have reviewed the social history and family history with the patient and they are unchanged from previous note.  ALLERGIES:  is allergic to betadine [povidone iodine] and penicillins.  MEDICATIONS:  Current Outpatient Medications  Medication Sig Dispense Refill   amLODipine (NORVASC) 5 MG tablet Take 1 tablet (5 mg total) by mouth daily. 90 tablet 1   b complex vitamins capsule Take 1 capsule by mouth daily.     Calcium Citrate-Vitamin D (CALCIUM + D PO) Take 1 tablet by mouth daily.     Cholecalciferol (VITAMIN D PO) Take 1 tablet by mouth daily.     levothyroxine (SYNTHROID) 25 MCG tablet TAKE 1 TABLET(25 MCG) BY MOUTH DAILY BEFORE BREAKFAST 90 tablet 3   Melatonin 10 MG CAPS Take 10 mg by mouth at bedtime.     metoprolol succinate (TOPROL-XL) 50 MG 24 hr tablet Take 1 tablet (50 mg total) by mouth daily. Take with or immediately following a meal. 90 tablet 3   Multiple Vitamins-Minerals (WOMENS MULTIVITAMIN PO) Take 1 tablet by mouth daily.     sacubitril-valsartan (ENTRESTO) 24-26 MG Take 1 tablet by mouth 2 (two) times daily. 180 tablet 3   simvastatin (ZOCOR) 20 MG tablet TAKE 1 TABLET(20 MG) BY MOUTH AT BEDTIME 90 tablet 3   tamoxifen (NOLVADEX) 20 MG tablet TAKE 1 TABLET(20 MG) BY MOUTH DAILY 90 tablet 1   traZODone (DESYREL) 50 MG tablet Take 0.5-1 tablets (25-50 mg total) by mouth at bedtime as needed for sleep. 90 tablet 3   No current facility-administered medications for this visit.    PHYSICAL EXAMINATION: ECOG PERFORMANCE STATUS: 0 - Asymptomatic  Vitals:   02/25/22 1306  BP: 128/85  Pulse: 79  Resp: 15  Temp: 98.2 F (36.8 C)  SpO2: 100%   Wt Readings from Last  3 Encounters:  02/25/22 112 lb (50.8 kg)  02/24/22 113 lb 12.8 oz (51.6 kg)  02/04/22 112 lb 12 oz (51.1 kg)    ,  NECK: supple, thyroid normal size, non-tender, without nodularity LYMPH:  no palpable lymphadenopathy in the cervical, axillary  LUNGS: clear to auscultation and percussion with normal breathing effort HEART: regular rate & rhythm and no murmurs and no lower extremity edema ABDOMEN:abdomen soft, non-tender and normal bowel sounds Musculoskeletal:no cyanosis of digits and no clubbing  NEURO: alert & oriented x 3 with fluent speech, no focal motor/sensory deficits BREAST: Right breast healing, no palpable mass breast exam benign  LABORATORY DATA:  I have reviewed the data as listed    Latest Ref Rng & Units 02/25/2022   12:49 PM 11/12/2021    1:48 PM 08/20/2021   10:49 AM  CBC  WBC 4.0 - 10.5  K/uL 7.0  5.2  5.0   Hemoglobin 12.0 - 15.0 g/dL 11.7  11.5  11.6   Hematocrit 36.0 - 46.0 % 34.1  33.8  33.7   Platelets 150 - 400 K/uL 219  195  185         Latest Ref Rng & Units 02/25/2022   12:49 PM 11/12/2021    1:48 PM 11/03/2021   11:52 AM  CMP  Glucose 70 - 99 mg/dL 128  122  102   BUN 6 - 20 mg/dL _0 Creatinine 0.44 - 1.00 mg/dL 0.79  0.80  0.71   Sodium 135 - 145 mmol/L 138  142  140   Potassium 3.5 - 5.1 mmol/L 4.2  4.1  3.9   Chloride 98 - 111 mmol/L 105  113  107   CO2 22 - 32 mmol/L _1 Calcium 8.9 - 10.3 mg/dL 10.2  9.6  9.1   Total Protein 6.5 - 8.1 g/dL 6.9  6.7    Total Bilirubin 0.3 - 1.2 mg/dL 0.4  0.3    Alkaline Phos 38 - 126 U/L 55  50    AST 15 - 41 U/L 22  21    ALT 0 - 44 U/L 8  10        RADIOGRAPHIC STUDIES: I have personally reviewed the radiological images as listed and agreed with the findings in the report. ECHOCARDIOGRAM COMPLETE  Result Date: 02/24/2022    ECHOCARDIOGRAM REPORT   Patient Name:   Dana Kramer  Date of Exam: 02/24/2022 Medical Rec #:  [id number]  Height:       60.0 in Accession #:    [id number] Weight:        112.7 lb Date of Birth:  03/28/1965  BSA:          1.463 m Patient Age:    69 years   BP:           144/90 mmHg Patient Gender: F          HR:           68 bpm. Exam Location:  Outpatient Procedure: 2D Echo, 3D Echo, Strain Analysis, Cardiac Doppler and Color Doppler Indications:    Congestive Heart Failure I50.9  History:        Patient has prior history of Echocardiogram examinations, most                 recent 12/23/2021. Malignant neoplasm of upper-outer quadrant of                 right breast in female, estrogen receptor positive ([place]),                 Arrythmias:Tachycardia; Risk Factors:Dyslipidemia and                 Non-Smoker.  Sonographer:    Greer Pickerel Referring Phys: [phone] [place] Comments: Image acquisition challenging due to mastectomy and Image acquisition challenging due to respiratory motion. Global longitudinal strain was attempted. IMPRESSIONS  1. Left ventricular ejection fraction, by estimation, is 55%. The left ventricle has normal function. The left ventricle has no regional wall motion abnormalities. Left ventricular diastolic parameters are consistent with Grade I diastolic dysfunction (impaired relaxation). GLS -15.9%, mildly less negative.  2. Right ventricular systolic function is mildly reduced. The right ventricular size is normal. There is normal pulmonary artery systolic pressure. The estimated  right ventricular systolic pressure is 87.6 mmHg.  3. The mitral valve is normal in structure. Trivial mitral valve regurgitation. No evidence of mitral stenosis.  4. The aortic valve is tricuspid. Aortic valve regurgitation is not visualized. No aortic stenosis is present.  5. Aortic dilatation noted. There is mild dilatation of the aortic root, measuring 38 mm.  6. The inferior vena cava is normal in size with greater than 50% respiratory variability, suggesting right atrial pressure of 3 mmHg. FINDINGS  Left Ventricle: Left ventricular ejection fraction, by  estimation, is 55%. The left ventricle has normal function. The left ventricle has no regional wall motion abnormalities. The left ventricular internal cavity size was normal in size. There is no left ventricular hypertrophy. Left ventricular diastolic parameters are consistent with Grade I diastolic dysfunction (impaired relaxation). Right Ventricle: The right ventricular size is normal. No increase in right ventricular wall thickness. Right ventricular systolic function is mildly reduced. There is normal pulmonary artery systolic pressure. The tricuspid regurgitant velocity is 1.46 m/s, and with an assumed right atrial pressure of 3 mmHg, the estimated right ventricular systolic pressure is 81.1 mmHg. Left Atrium: Left atrial size was normal in size. Right Atrium: Right atrial size was normal in size. Pericardium: There is no evidence of pericardial effusion. Mitral Valve: The mitral valve is normal in structure. Trivial mitral valve regurgitation. No evidence of mitral valve stenosis. Tricuspid Valve: The tricuspid valve is normal in structure. Tricuspid valve regurgitation is trivial. Aortic Valve: The aortic valve is tricuspid. Aortic valve regurgitation is not visualized. No aortic stenosis is present. Pulmonic Valve: The pulmonic valve was normal in structure. Pulmonic valve regurgitation is not visualized. Aorta: Aortic dilatation noted. There is mild dilatation of the aortic root, measuring 38 mm. Venous: The inferior vena cava is normal in size with greater than 50% respiratory variability, suggesting right atrial pressure of 3 mmHg. IAS/Shunts: No atrial level shunt detected by color flow Doppler.  LEFT VENTRICLE PLAX 2D LVIDd:         4.60 cm     Diastology LVIDs:         3.50 cm     LV e' medial:  7.40 cm/s LV PW:         0.90 cm     LV e' lateral: 10.80 cm/s LV IVS:        0.60 cm LVOT diam:     1.90 cm LV SV:         59 LV SV Index:   40 LVOT Area:     2.84 cm    3D Volume EF:                             3D EF:        54 %                            LV EDV:       94 ml LV Volumes (MOD)           LV ESV:       43 ml LV vol d, MOD A2C: 88.3 ml LV SV:        50 ml LV vol d, MOD A4C: 80.8 ml LV vol s, MOD A2C: 41.6 ml LV vol s, MOD A4C: 40.2 ml LV SV MOD A2C:     46.7 ml LV SV MOD A4C:  80.8 ml LV SV MOD BP:      44.3 ml RIGHT VENTRICLE RV S prime:     8.05 cm/s TAPSE (M-mode): 1.5 cm LEFT ATRIUM             Index        RIGHT ATRIUM           Index LA diam:        3.30 cm 2.26 cm/m   RA Area:     11.50 cm LA Vol (A2C):   36.2 ml 24.74 ml/m  RA Volume:   23.50 ml  16.06 ml/m LA Vol (A4C):   36.2 ml 24.74 ml/m LA Biplane Vol: 38.3 ml 26.18 ml/m  AORTIC VALVE LVOT Vmax:   102.00 cm/s LVOT Vmean:  64.300 cm/s LVOT VTI:    0.208 m  AORTA Ao Root diam: 3.80 cm Ao Asc diam:  3.50 cm MR Peak grad: 46.6 mmHg   TRICUSPID VALVE MR Vmax:      341.50 cm/s TR Peak grad:   8.5 mmHg                           TR Vmax:        146.00 cm/s                            SHUNTS                           Systemic VTI:  0.21 m                           Systemic Diam: 1.90 cm Dalton McleanMD Electronically signed by Franki Monte Signature Date/Time: 02/24/2022/11:47:41 AM    Final       No orders of the defined types were placed in this encounter.  All questions were answered. The patient knows to call the clinic with any problems, questions or concerns. No barriers to learning was detected. The total time spent in the appointment was 30 minutes.     Truitt Merle, MD 02/25/2022   Felicity Coyer, CMA, am acting as scribe for Truitt Merle, MD.   I have reviewed the above documentation for accuracy and completeness, and I agree with the above.

## 2022-02-27 ENCOUNTER — Other Ambulatory Visit: Payer: Self-pay

## 2022-03-03 ENCOUNTER — Other Ambulatory Visit: Payer: Self-pay | Admitting: Family Medicine

## 2022-03-03 DIAGNOSIS — I10 Essential (primary) hypertension: Secondary | ICD-10-CM

## 2022-03-03 DIAGNOSIS — R000 Tachycardia, unspecified: Secondary | ICD-10-CM

## 2022-03-04 ENCOUNTER — Other Ambulatory Visit: Payer: Self-pay | Admitting: Family Medicine

## 2022-03-04 DIAGNOSIS — F5102 Adjustment insomnia: Secondary | ICD-10-CM

## 2022-03-18 ENCOUNTER — Inpatient Hospital Stay: Payer: Managed Care, Other (non HMO) | Attending: Hematology

## 2022-03-18 ENCOUNTER — Ambulatory Visit: Payer: Managed Care, Other (non HMO) | Admitting: Hematology

## 2022-03-18 VITALS — BP 128/83 | HR 83 | Temp 98.0°F | Resp 15 | Wt 111.5 lb

## 2022-03-18 DIAGNOSIS — Z5112 Encounter for antineoplastic immunotherapy: Secondary | ICD-10-CM | POA: Diagnosis present

## 2022-03-18 DIAGNOSIS — C50411 Malignant neoplasm of upper-outer quadrant of right female breast: Secondary | ICD-10-CM | POA: Insufficient documentation

## 2022-03-18 DIAGNOSIS — Z170 Estrogen receptor positive status [ER+]: Secondary | ICD-10-CM | POA: Diagnosis not present

## 2022-03-18 DIAGNOSIS — Z79899 Other long term (current) drug therapy: Secondary | ICD-10-CM | POA: Diagnosis not present

## 2022-03-18 MED ORDER — TRASTUZUMAB-HYALURONIDASE-OYSK 600-10000 MG-UNT/5ML ~~LOC~~ SOLN
600.0000 mg | Freq: Once | SUBCUTANEOUS | Status: AC
  Administered 2022-03-18: 600 mg via SUBCUTANEOUS
  Filled 2022-03-18: qty 5

## 2022-03-18 MED ORDER — ACETAMINOPHEN 325 MG PO TABS
650.0000 mg | ORAL_TABLET | Freq: Once | ORAL | Status: AC
  Administered 2022-03-18: 650 mg via ORAL
  Filled 2022-03-18: qty 2

## 2022-03-18 MED ORDER — DIPHENHYDRAMINE HCL 25 MG PO CAPS
25.0000 mg | ORAL_CAPSULE | Freq: Once | ORAL | Status: AC
  Administered 2022-03-18: 25 mg via ORAL
  Filled 2022-03-18: qty 1

## 2022-03-19 ENCOUNTER — Encounter: Payer: Self-pay | Admitting: *Deleted

## 2022-03-19 ENCOUNTER — Other Ambulatory Visit: Payer: Self-pay

## 2022-04-07 ENCOUNTER — Encounter: Payer: Self-pay | Admitting: *Deleted

## 2022-04-07 DIAGNOSIS — Z170 Estrogen receptor positive status [ER+]: Secondary | ICD-10-CM

## 2022-04-08 ENCOUNTER — Other Ambulatory Visit: Payer: Self-pay

## 2022-04-08 ENCOUNTER — Inpatient Hospital Stay: Payer: Managed Care, Other (non HMO) | Admitting: Dietician

## 2022-04-08 ENCOUNTER — Inpatient Hospital Stay: Payer: Managed Care, Other (non HMO)

## 2022-04-08 VITALS — BP 117/89 | HR 85 | Temp 97.8°F | Resp 16 | Wt 110.5 lb

## 2022-04-08 DIAGNOSIS — Z170 Estrogen receptor positive status [ER+]: Secondary | ICD-10-CM

## 2022-04-08 DIAGNOSIS — Z5112 Encounter for antineoplastic immunotherapy: Secondary | ICD-10-CM | POA: Diagnosis not present

## 2022-04-08 MED ORDER — DIPHENHYDRAMINE HCL 25 MG PO CAPS
25.0000 mg | ORAL_CAPSULE | Freq: Once | ORAL | Status: AC
  Administered 2022-04-08: 25 mg via ORAL
  Filled 2022-04-08: qty 1

## 2022-04-08 MED ORDER — ACETAMINOPHEN 325 MG PO TABS
650.0000 mg | ORAL_TABLET | Freq: Once | ORAL | Status: AC
  Administered 2022-04-08: 650 mg via ORAL
  Filled 2022-04-08: qty 2

## 2022-04-08 MED ORDER — TRASTUZUMAB-HYALURONIDASE-OYSK 600-10000 MG-UNT/5ML ~~LOC~~ SOLN
600.0000 mg | Freq: Once | SUBCUTANEOUS | Status: AC
  Administered 2022-04-08: 600 mg via SUBCUTANEOUS
  Filled 2022-04-08: qty 5

## 2022-04-08 NOTE — Patient Instructions (Addendum)
[facility]  Discharge Instructions: Thank you for choosing [facility] to provide your oncology and hematology care.   If you have a lab appointment with the Cancer Center, please go directly to the Cancer Center and check in at the registration area.   Wear comfortable clothing and clothing appropriate for easy access to any Portacath or PICC line.   We strive to give you quality time with your provider. You may need to reschedule your appointment if you arrive late (15 or more minutes).  Arriving late affects you and other patients whose appointments are after yours.  Also, if you miss three or more appointments without notifying the office, you may be dismissed from the clinic at the provider's discretion.      For prescription refill requests, have your pharmacy contact our office and allow 72 hours for refills to be completed.    Today you received the following chemotherapy and/or immunotherapy agents : Herceptin Hylecta      To help prevent nausea and vomiting after your treatment, we encourage you to take your nausea medication as directed.  BELOW ARE SYMPTOMS THAT SHOULD BE REPORTED IMMEDIATELY: *FEVER GREATER THAN 100.4 F (38 C) OR HIGHER *CHILLS OR SWEATING *NAUSEA AND VOMITING THAT IS NOT CONTROLLED WITH YOUR NAUSEA MEDICATION *UNUSUAL SHORTNESS OF BREATH *UNUSUAL BRUISING OR BLEEDING *URINARY PROBLEMS (pain or burning when urinating, or frequent urination) *BOWEL PROBLEMS (unusual diarrhea, constipation, pain near the anus) TENDERNESS IN MOUTH AND THROAT WITH OR WITHOUT PRESENCE OF ULCERS (sore throat, sores in mouth, or a toothache) UNUSUAL RASH, SWELLING OR PAIN  UNUSUAL VAGINAL DISCHARGE OR ITCHING   Items with * indicate a potential emergency and should be followed up as soon as possible or go to the Emergency Department if any problems should occur.  Please show the CHEMOTHERAPY ALERT CARD or IMMUNOTHERAPY ALERT CARD  at check-in to the Emergency Department and triage nurse.  Should you have questions after your visit or need to cancel or reschedule your appointment, please contact [facility]  Dept: [phone]  and follow the prompts.  Office hours are 8:00 a.m. to 4:30 p.m. Monday - Friday. Please note that voicemails left after 4:00 p.m. may not be returned until the following business day.  We are closed weekends and major holidays. You have access to a nurse at all times for urgent questions. Please call the main number to the clinic Dept: [phone] and follow the prompts.   For any non-urgent questions, you may also contact your provider using MyChart. We now offer e-Visits for anyone 18 and older to request care online for non-urgent symptoms. For details visit [email or web address].   Also download the MyChart app! Go to the app store, search "MyChart", open the app, select [facility], and log in with your MyChart username and password.   

## 2022-04-14 ENCOUNTER — Encounter: Payer: Self-pay | Admitting: Family Medicine

## 2022-04-14 ENCOUNTER — Telehealth: Payer: Self-pay | Admitting: Family Medicine

## 2022-04-14 NOTE — Telephone Encounter (Signed)
Pt states she has been dizzy for the past two days. Transferred to triage.

## 2022-04-14 NOTE — Telephone Encounter (Signed)
Initial Comment Caller states she is having dizziness. Blood pressure is fine and heart rate is in 80s. States she can't bend down or when she turns her head from side to side she feels it. Denies headache. She is on chemo treatments. Translation No Nurse Assessment Nurse: Rolin Barry, RN, Levada Dy Date/Time Eilene Ghazi Time): 04/14/2022 9:16:19 AM Confirm and document reason for call. If symptomatic, describe symptoms. ---Caller states she is having dizziness. Blood pressure is fine and heart rate is in 80s. States she can't bend down or when she turns her head from side to side she feels it. Denies headache. She is on chemo treatments 2 months ago. Finished chemo, finished immunotherapy last Wednesday. Dizziness started last week. Does the patient have any new or worsening symptoms? ---Yes Will a triage be completed? ---Yes Related visit to physician within the last 2 weeks? ---No Does the PT have any chronic conditions? (i.e. diabetes, asthma, this includes High risk factors for pregnancy, etc.) ---Yes List chronic conditions. ---was being treated for breast cancer increased heart rate high lipids HTN Is the patient pregnant or possibly pregnant? (Ask all females between the ages of 20-55) ---No Is this a behavioral health or substance abuse call? ---No Guidelines Guideline Title Affirmed Question Affirmed Notes Nurse Date/Time (Eastern Time) Dizziness - Lightheadedness SEVERE dizziness (e.g., unable to stand, Deaton, RN, Levada Dy 04/14/2022 9:18:20 AM PLEASE NOTE: All timestamps contained within this report are represented as [place] Standard Time. CONFIDENTIALTY NOTICE: This fax transmission is intended only for the addressee. It contains information that is legally privileged, confidential or otherwise protected from use or disclosure. If you are not the intended recipient, you are strictly prohibited from reviewing, disclosing, copying using or disseminating any of this information or  taking any action in reliance on or regarding this information. If you have received this fax in error, please notify us immediately by telephone so that we can arrange for its return to [place]. Phone: [phone], Toll-Free: [phone], Fax: [phone] Page: 2 of 2 Call Id: [id number] Guidelines Guideline Title Affirmed Question Affirmed Notes Nurse Date/Time Eilene Ghazi Time) requires support to walk, feels like passing out now) Disp. Time Eilene Ghazi Time) Disposition Final User 04/14/2022 8:52:57 AM Attempt made - message left Deaton, RN, Levada Dy 04/14/2022 9:21:06 AM Go to ED Now (or PCP triage) Yes Deaton, RN, Levada Dy Final Disposition 04/14/2022 9:21:06 AM Go to ED Now (or PCP triage) Yes Deaton, RN, Cindee Lame Disagree/Comply Comply Caller Understands Yes PreDisposition Did not know what to do Care Advice Given Per Guideline GO TO ED NOW (OR PCP TRIAGE): ANOTHER ADULT SHOULD DRIVE: * It is better and safer if another adult drives instead of you. CARE ADVICE given per Dizziness (Adult) guideline. Referrals [facility] - ED

## 2022-04-15 ENCOUNTER — Encounter: Payer: Self-pay | Admitting: Family Medicine

## 2022-04-15 ENCOUNTER — Ambulatory Visit: Payer: Managed Care, Other (non HMO) | Admitting: Family Medicine

## 2022-04-15 VITALS — BP 116/72 | HR 76 | Temp 98.0°F | Resp 18 | Ht 60.0 in | Wt 107.8 lb

## 2022-04-15 DIAGNOSIS — R42 Dizziness and giddiness: Secondary | ICD-10-CM

## 2022-04-15 DIAGNOSIS — H5711 Ocular pain, right eye: Secondary | ICD-10-CM | POA: Diagnosis not present

## 2022-04-15 LAB — COMPREHENSIVE METABOLIC PANEL
ALT: 8 U/L (ref 0–35)
AST: 20 U/L (ref 0–37)
Albumin: 4.4 g/dL (ref 3.5–5.2)
Alkaline Phosphatase: 50 U/L (ref 39–117)
BUN: 10 mg/dL (ref 6–23)
CO2: 27 mEq/L (ref 19–32)
Calcium: 9.2 mg/dL (ref 8.4–10.5)
Chloride: 106 mEq/L (ref 96–112)
Creatinine, Ser: 0.72 mg/dL (ref 0.40–1.20)
GFR: 94.82 mL/min (ref >60.00)
Glucose, Bld: 91 mg/dL (ref 70–99)
Potassium: 4.3 mEq/L (ref 3.5–5.1)
Sodium: 140 mEq/L (ref 135–145)
Total Bilirubin: 0.3 mg/dL (ref 0.2–1.2)
Total Protein: 6.9 g/dL (ref 6.0–8.3)

## 2022-04-15 LAB — CBC
HCT: 35.5 % — ABNORMAL LOW (ref 36.0–46.0)
Hemoglobin: 12.2 g/dL (ref 12.0–15.0)
MCHC: 34.4 g/dL (ref 30.0–36.0)
MCV: 84.7 fl (ref 78.0–100.0)
Platelets: 229 10*3/uL (ref 150.0–400.0)
RBC: 4.19 Mil/uL (ref 3.87–5.11)
RDW: 12.2 % (ref 11.5–15.5)
WBC: 5.4 10*3/uL (ref 4.0–10.5)

## 2022-04-15 MED ORDER — MECLIZINE HCL 25 MG PO TABS: 25.0000 mg | ORAL_TABLET | Freq: Three times a day (TID) | ORAL | 0 refills | Status: DC | PRN

## 2022-04-15 NOTE — Progress Notes (Addendum)
[place] at [facility], [place],  [address] [phone]  Date:  04/15/2022   Name:  Dana Kramer   DOB:  01-15-1966   MRN:  [id number]  PCP:  Darreld Mclean, MD    Chief Complaint: Dizziness (Onset dizziness  last week here and there /04/14/2022 dizziness was constant /Today feels much better still there but not as much )   History of Present Illness:  Dana Kramer is a 57 y.o. very pleasant female patient who presents with the following:  Pt seen today with concern of dizziness Last seen by myself in September  History of hypothyroidism, hyperlipidemia, anxiety and insomnia She was also diagnosed with breast cancer 2022, had a bilateral mastectomy in November 2022 and is receiving chemotherapy-patient notes she just finished her last infusion, she will continue oral chemotherapy with tamoxifen for several years.  At our last visit she was back working and exercising-she admits she recently worked a 7-day stretch, she is a Lobbyist trazodone for sleep    Most recent oncology note 12/23-  CURRENT THERAPY:  -Herceptin, starting 02/25/21, currently maintenance injection q21d -tamoxifen, started 06/20/21  She contacted [place] yesterday with dizziness and we made this appt - she noted lightheadedness 8- 10 days ago.  Not vertigo per her report She notes it most when she bends down and then stands back up She generally begins to notice it around the middle of the day It is somewhat better today.  She took meclizine yesterday and feels like it really helped her  She notes her BP has been running normal- not low in general  No CP  No SOB No syncope   She also notes discomfort in the lateral aspect of her right eye when she moves her eyes around.  Vision seems normal and her eyes are not painful at rest   Wt Readings from Last 3 Encounters:  04/15/22 107 lb 12.8 oz (48.9 kg)  04/08/22 110 lb 8 oz (50.1 kg)   03/18/22 111 lb 8 oz (50.6 kg)     BP Readings from Last 3 Encounters:  04/15/22 116/72  04/08/22 117/89  03/18/22 128/83     Patient Active Problem List   Diagnosis Date Noted   Port-A-Cath in place 03/04/2021   Genetic testing 01/27/2021   S/P mastectomy, bilateral 01/23/2021   Family history of colon cancer 01/15/2021   Malignant neoplasm of upper-outer quadrant of right breast in female, estrogen receptor positive ([place]) 01/13/2021   Pre-diabetes 11/06/2020   Acquired hypothyroidism 04/14/2018   Migraine 01/18/2015   Insomnia 11/05/2011   Anxiety 11/05/2011   Hyperlipidemia 11/05/2011   Tachycardia 76/16/0737   Umbilical hernia 10/62/6948    Past Medical History:  Diagnosis Date   Allergy 11/1998   Penicillin   Chicken pox    History of transfusion of whole blood    Hyperlipidemia    Malignant neoplasm of upper-outer quadrant of right breast in female, estrogen receptor positive ([place]) 01/13/2021   Migraine    Thyroid disease    Hypothyroidism    Past Surgical History:  Procedure Laterality Date   ABDOMINOPLASTY  10/01/2010   CESAREAN SECTION     twice   HERNIA REPAIR     PORTACATH PLACEMENT Right 01/23/2021   Procedure: Right internal jugular 8 [place] PowerPort with C arm and ultrasound guidance;  Surgeon: Erroll Luna, MD;  Location: [facility];  Service: General;  Laterality: Right;   SENTINEL  NODE BIOPSY Right 01/23/2021   Procedure: Right axillary sentinel lymph node mapping using Mag Trace;  Surgeon: Erroll Luna, MD;  Location: [facility];  Service: General;  Laterality: Right;   SIMPLE MASTECTOMY WITH AXILLARY SENTINEL NODE BIOPSY Bilateral 01/23/2021   Procedure: Right simple mastectomy ;  Left risk reducing simple mastectomy;  Surgeon: Erroll Luna, MD;  Location: [facility];  Service: General;  Laterality: Bilateral;   TUBAL LIGATION     umblica hernia repair  08/67/6195    Social History   Tobacco Use   Smoking status: Never   Smokeless tobacco:  Never  Vaping Use   Vaping Use: Never used  Substance Use Topics   Alcohol use: Never   Drug use: Never    Family History  Problem Relation Age of Onset   Hypertension Mother    Cancer Father 53       esophageal cancer   Leukemia Maternal Grandfather        dx. >50   Colon cancer Paternal Grandfather        dx. >50   Lung cancer Cousin        dx. 23s, did not smoke    Allergies  Allergen Reactions   Betadine [Povidone Iodine] Rash   Penicillins Rash    Mainly upper body only.    Medication list has been reviewed and updated.  Current Outpatient Medications on File Prior to Visit  Medication Sig Dispense Refill   amLODipine (NORVASC) 5 MG tablet Take 1 tablet (5 mg total) by mouth daily. 90 tablet 1   b complex vitamins capsule Take 1 capsule by mouth daily.     Calcium Citrate-Vitamin D (CALCIUM + D PO) Take 1 tablet by mouth daily.     Cholecalciferol (VITAMIN D PO) Take 1 tablet by mouth daily.     levothyroxine (SYNTHROID) 25 MCG tablet TAKE 1 TABLET(25 MCG) BY MOUTH DAILY BEFORE BREAKFAST 90 tablet 3   Melatonin 10 MG CAPS Take 10 mg by mouth at bedtime.     metoprolol succinate (TOPROL-XL) 50 MG 24 hr tablet TAKE 1 TABLET(50 MG) BY MOUTH DAILY WITH OR IMMEDIATELY FOLLOWING A MEAL 90 tablet 3   Multiple Vitamins-Minerals (WOMENS MULTIVITAMIN PO) Take 1 tablet by mouth daily.     sacubitril-valsartan (ENTRESTO) 24-26 MG Take 1 tablet by mouth 2 (two) times daily. 180 tablet 3   simvastatin (ZOCOR) 20 MG tablet TAKE 1 TABLET(20 MG) BY MOUTH AT BEDTIME 90 tablet 3   tamoxifen (NOLVADEX) 20 MG tablet TAKE 1 TABLET(20 MG) BY MOUTH DAILY 90 tablet 1   traZODone (DESYREL) 50 MG tablet TAKE 1/2 TO 1 TABLET(25 TO 50 MG) BY MOUTH AT BEDTIME AS NEEDED FOR SLEEP 30 tablet 1   No current facility-administered medications on file prior to visit.    Review of Systems:  As per HPI- otherwise negative.   Physical Examination: Vitals:   04/15/22 1002  BP: 116/72  Pulse: 76   Resp: 18  Temp: 98 F (36.7 C)  SpO2: 93%   Vitals:   04/15/22 1002  Weight: 107 lb 12.8 oz (48.9 kg)  Height: 5' (1.524 m)   Body mass index is 21.05 kg/m. Ideal Body Weight: Weight in (lb) to have BMI = 25: 127.7  GEN: no acute distress.  Normal weight, looks well HEENT: Atraumatic, Normocephalic.  Bilateral TM wnl, oropharynx normal.  PEERL,EOMI. limited funduscopic exam is normal Globes are not tender to palpation.  However, patient notes when she looks to the left  the lateral aspect of her right eye is uncomfortable Ears and Nose: No external deformity. CV: RRR, No M/G/R. No JVD. No thrill. No extra heart sounds. PULM: CTA B, no wheezes, crackles, rhonchi. No retractions. No resp. distress. No accessory muscle use. ABD: S, NT, ND EXTR: No c/c/e PSYCH: Normally interactive. Conversant.  Normal strength and deep tendon reflex of all extremities.  Dix-Hallpike is negative RecheckO2 sat 96% Blood pressure supine 122/80 Blood pressure standing 116/70  Assessment and Plan: Acute right eye pain - Plan: CT ORBITS W WO CONTRAST  Dizziness - Plan: CT ORBITS W WO CONTRAST, CBC, Comprehensive metabolic panel  Patient seen today for follow-up.  She has noticed some unusual dizziness-seems to combine aspects of both vertigo and lightheadedness.  Meclizine has helped, gave her prescription for same  She has noted unusual discomfort in her right eye, vision is normal but she notes discomfort when looking to the left.  Will obtain a CT of her orbits ASAP to rule out cellulitis or any complications from breast cancer Will obtain stat labs today and then get her CT done as soon as possible, hopefully later today    Signed Lamar Blinks, MD Results for orders placed or performed in visit on 04/15/22  CBC  Result Value Ref Range   WBC 5.4 4.0 - 10.5 K/uL   RBC 4.19 3.87 - 5.11 Mil/uL   Platelets 229.0 150.0 - 400.0 K/uL   Hemoglobin 12.2 12.0 - 15.0 g/dL   HCT 35.5 (L) 36.0 -  46.0 %   MCV 84.7 78.0 - 100.0 fl   MCHC 34.4 30.0 - 36.0 g/dL   RDW 12.2 11.5 - 15.5 %

## 2022-04-15 NOTE — Patient Instructions (Addendum)
It was good to see you today!  I will be in touch with your labs Please stop by imaging on the way out and schedule your CT- we should have your kidney labs back by the end of the day if you want to schedule it for later this afternoon today

## 2022-04-16 ENCOUNTER — Encounter (HOSPITAL_BASED_OUTPATIENT_CLINIC_OR_DEPARTMENT_OTHER): Payer: Self-pay

## 2022-04-16 ENCOUNTER — Ambulatory Visit (HOSPITAL_BASED_OUTPATIENT_CLINIC_OR_DEPARTMENT_OTHER)
Admission: RE | Admit: 2022-04-16 | Discharge: 2022-04-16 | Disposition: A | Discharge status: Home / Self Care | Payer: Managed Care, Other (non HMO) | Source: Ambulatory Visit | Attending: Family Medicine | Admitting: Family Medicine

## 2022-04-16 ENCOUNTER — Encounter: Payer: Self-pay | Admitting: Family Medicine

## 2022-04-16 DIAGNOSIS — R42 Dizziness and giddiness: Secondary | ICD-10-CM | POA: Insufficient documentation

## 2022-04-16 DIAGNOSIS — H5711 Ocular pain, right eye: Secondary | ICD-10-CM | POA: Diagnosis not present

## 2022-04-16 MED ORDER — IOHEXOL 300 MG/ML  SOLN
100.0000 mL | Freq: Once | INTRAMUSCULAR | Status: AC | PRN
  Administered 2022-04-16: 75 mL via INTRAVENOUS

## 2022-04-22 ENCOUNTER — Encounter: Payer: Self-pay | Admitting: Rehabilitation

## 2022-04-22 ENCOUNTER — Ambulatory Visit: Payer: Managed Care, Other (non HMO) | Attending: Surgery | Admitting: Rehabilitation

## 2022-04-22 DIAGNOSIS — R600 Localized edema: Secondary | ICD-10-CM | POA: Insufficient documentation

## 2022-04-22 DIAGNOSIS — R293 Abnormal posture: Secondary | ICD-10-CM | POA: Insufficient documentation

## 2022-04-22 DIAGNOSIS — M25612 Stiffness of left shoulder, not elsewhere classified: Secondary | ICD-10-CM | POA: Insufficient documentation

## 2022-04-22 DIAGNOSIS — Z170 Estrogen receptor positive status [ER+]: Secondary | ICD-10-CM | POA: Insufficient documentation

## 2022-04-22 DIAGNOSIS — C50411 Malignant neoplasm of upper-outer quadrant of right female breast: Secondary | ICD-10-CM | POA: Insufficient documentation

## 2022-04-22 DIAGNOSIS — Z483 Aftercare following surgery for neoplasm: Secondary | ICD-10-CM | POA: Insufficient documentation

## 2022-04-22 DIAGNOSIS — M25611 Stiffness of right shoulder, not elsewhere classified: Secondary | ICD-10-CM | POA: Insufficient documentation

## 2022-04-22 NOTE — Therapy (Signed)
  OUTPATIENT PHYSICAL THERAPY SOZO SCREENING NOTE   Patient Name: Dana Kramer MRN: [id number] DOB:[birth year], 57 y.o., female Today's Date: 04/22/2022  PCP: Darreld Mclean, MD REFERRING PROVIDER: Erroll Luna, MD   PT End of Session - 04/22/22 1200     Visit Number 11   screen   PT Start Time 5284    PT Stop Time 1200    PT Time Calculation (min) 5 min    Activity Tolerance Patient tolerated treatment well    Behavior During Therapy [facility] for tasks assessed/performed             Past Medical History:  Diagnosis Date   Allergy 11/1998   Penicillin   Chicken pox    History of transfusion of whole blood    Hyperlipidemia    Malignant neoplasm of upper-outer quadrant of right breast in female, estrogen receptor positive ([place]) 01/13/2021   Migraine    Thyroid disease    Hypothyroidism   Past Surgical History:  Procedure Laterality Date   ABDOMINOPLASTY  10/01/2010   CESAREAN SECTION     twice   HERNIA REPAIR     PORTACATH PLACEMENT Right 01/23/2021   Procedure: Right internal jugular 8 [place] PowerPort with C arm and ultrasound guidance;  Surgeon: Erroll Luna, MD;  Location: [facility];  Service: General;  Laterality: Right;   SENTINEL NODE BIOPSY Right 01/23/2021   Procedure: Right axillary sentinel lymph node mapping using Mag Trace;  Surgeon: Erroll Luna, MD;  Location: [facility];  Service: General;  Laterality: Right;   SIMPLE MASTECTOMY WITH AXILLARY SENTINEL NODE BIOPSY Bilateral 01/23/2021   Procedure: Right simple mastectomy ;  Left risk reducing simple mastectomy;  Surgeon: Erroll Luna, MD;  Location: [facility];  Service: General;  Laterality: Bilateral;   TUBAL LIGATION     umblica hernia repair  13/24/4010   Patient Active Problem List   Diagnosis Date Noted   Port-A-Cath in place 03/04/2021   Genetic testing 01/27/2021   S/P mastectomy, bilateral 01/23/2021   Family history of colon cancer 01/15/2021   Malignant neoplasm of upper-outer quadrant of  right breast in female, estrogen receptor positive ([place]) 01/13/2021   Pre-diabetes 11/06/2020   Acquired hypothyroidism 04/14/2018   Migraine 01/18/2015   Insomnia 11/05/2011   Anxiety 11/05/2011   Hyperlipidemia 11/05/2011   Tachycardia 27/25/3664   Umbilical hernia 40/34/7425    REFERRING DIAG: right breast cancer at risk for lymphedema  THERAPY DIAG:  Aftercare following surgery for neoplasm  Malignant neoplasm of upper-outer quadrant of right breast in female, estrogen receptor positive ([place])  Abnormal posture  Stiffness of right shoulder, not elsewhere classified  Stiffness of left shoulder, not elsewhere classified  Localized edema  PRECAUTIONS: right UE Lymphedema risk,   SUBJECTIVE: Doing well lately  PAIN:  Are you having pain? No  SOZO SCREENING: Patient was assessed today using the SOZO machine to determine the lymphedema index score. This was compared to her baseline score. It was determined that she is within the recommended range when compared to her baseline and no further action is needed at this time. She will continue SOZO screenings. These are done every 3 months for 2 years post operatively followed by every 6 months for 2 years, and then annually.   Stark Bray, PT 04/22/2022, 12:01 PM

## 2022-04-23 ENCOUNTER — Other Ambulatory Visit: Payer: Self-pay

## 2022-05-01 ENCOUNTER — Other Ambulatory Visit: Payer: Self-pay | Admitting: Family Medicine

## 2022-05-01 DIAGNOSIS — E039 Hypothyroidism, unspecified: Secondary | ICD-10-CM

## 2022-05-13 NOTE — Progress Notes (Signed)
[place] at [facility] [address] [place], [place] [address] [phone]  Date:  05/25/2022   Name:  Dana Kramer   DOB:  [birth year]   MRN:  [id number]  PCP:  Darreld Mclean, MD    Chief Complaint: 6 month follow up (Concerns/ questions: pt would like refills, [place] of [place]. )   History of Present Illness:  Dana Kramer is a 57 y.o. very pleasant female patient who presents with the following:  Patient seen today for periodic follow-up.  I actually saw her about 5 weeks ago for an acute problem History of hypothyroidism, hyperlipidemia, anxiety and insomnia, mild hypertension She was also diagnosed with breast cancer 2022, had a bilateral mastectomy in November 2022, has completed infusion chemotherapy, she will continue oral chemotherapy with tamoxifen for several years. 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 Seen by CHF clinic in December- echo 55%, continue toprol and entresto   She is a nurse on inpatient unit She enjoys exercise, weightlifting  We did some blood work at her acute visit in [place], CBC only  She will still notice some lightheadedness on rare occasion- this is much better however She is still working a lot She is traveling to [place] for 2 months next week- she will get to see her mom whom she has not seen in 7 years   She is seeing Dr Aundra Dubin tomorrow for CHF recheck  Lab Results  Component Value Date   HGBA1C 6.0 11/19/2021    COVID booster- recommended Pap is up-to-date Cologuard up-to-date  Amlodipine 5 Levothyroxine 25 Toprol-XL 50 Entresto Tamoxifen Trazodone at bedtime Patient Active Problem List   Diagnosis Date Noted   Port-A-Cath in place 03/04/2021   Genetic testing 01/27/2021   S/P mastectomy, bilateral 01/23/2021   Family history of colon cancer 01/15/2021   Malignant neoplasm of upper-outer quadrant of right  breast in female, estrogen receptor positive ([place]) 01/13/2021   Pre-diabetes 11/06/2020   Acquired hypothyroidism 04/14/2018   Migraine 01/18/2015   Insomnia 11/05/2011   Anxiety 11/05/2011   Hyperlipidemia 11/05/2011   Tachycardia 10/93/2355   Umbilical hernia 73/22/0254    Past Medical History:  Diagnosis Date   Allergy 11/1998   Penicillin   Chicken pox    History of transfusion of whole blood    Hyperlipidemia    Malignant neoplasm of upper-outer quadrant of right breast in female, estrogen receptor positive ([place]) 01/13/2021   Migraine    Thyroid disease    Hypothyroidism    Past Surgical History:  Procedure Laterality Date   ABDOMINOPLASTY  10/01/2010   CESAREAN SECTION     twice   HERNIA REPAIR     PORTACATH PLACEMENT Right 01/23/2021   Procedure: Right internal jugular 8 [place] PowerPort with C arm and ultrasound guidance;  Surgeon: Erroll Luna, MD;  Location: [facility];  Service: General;  Laterality: Right;   SENTINEL NODE BIOPSY Right 01/23/2021   Procedure: Right axillary sentinel lymph node mapping using Mag Trace;  Surgeon: Erroll Luna, MD;  Location: [facility];  Service: General;  Laterality: Right;   SIMPLE MASTECTOMY WITH AXILLARY SENTINEL NODE BIOPSY Bilateral 01/23/2021   Procedure: Right simple mastectomy ;  Left risk reducing simple mastectomy;  Surgeon: Erroll Luna, MD;  Location: [facility];  Service: General;  Laterality: Bilateral;   TUBAL LIGATION     umblica hernia repair  27/08/2374  Social History   Tobacco Use   Smoking status: Never   Smokeless tobacco: Never  Vaping Use   Vaping Use: Never used  Substance Use Topics   Alcohol use: Never   Drug use: Never    Family History  Problem Relation Age of Onset   Hypertension Mother    Cancer Father 68       esophageal cancer   Leukemia Maternal Grandfather        dx. >50   Colon cancer Paternal Grandfather        dx. >50   Lung cancer Cousin        dx. 18s, did not smoke     Allergies  Allergen Reactions   Betadine [Povidone Iodine] Rash   Penicillins Rash    Mainly upper body only.    Medication list has been reviewed and updated.  Current Outpatient Medications on File Prior to Visit  Medication Sig Dispense Refill   amLODipine (NORVASC) 5 MG tablet Take 1 tablet (5 mg total) by mouth daily. 90 tablet 1   b complex vitamins capsule Take 1 capsule by mouth daily.     Calcium Citrate-Vitamin D (CALCIUM + D PO) Take 1 tablet by mouth daily.     Cholecalciferol (VITAMIN D PO) Take 1 tablet by mouth daily.     levothyroxine (SYNTHROID) 25 MCG tablet TAKE 1 TABLET(25 MCG) BY MOUTH DAILY BEFORE BREAKFAST 90 tablet 3   meclizine (ANTIVERT) 25 MG tablet Take 1 tablet (25 mg total) by mouth 3 (three) times daily as needed for dizziness. 30 tablet 0   Melatonin 10 MG CAPS Take 10 mg by mouth at bedtime.     metoprolol succinate (TOPROL-XL) 50 MG 24 hr tablet TAKE 1 TABLET(50 MG) BY MOUTH DAILY WITH OR IMMEDIATELY FOLLOWING A MEAL 90 tablet 3   Multiple Vitamins-Minerals (WOMENS MULTIVITAMIN PO) Take 1 tablet by mouth daily.     sacubitril-valsartan (ENTRESTO) 24-26 MG Take 1 tablet by mouth 2 (two) times daily. 180 tablet 3   simvastatin (ZOCOR) 20 MG tablet TAKE 1 TABLET(20 MG) BY MOUTH AT BEDTIME 90 tablet 3   tamoxifen (NOLVADEX) 20 MG tablet TAKE 1 TABLET(20 MG) BY MOUTH DAILY 90 tablet 1   traZODone (DESYREL) 50 MG tablet TAKE 1/2 TO 1 TABLET(25 TO 50 MG) BY MOUTH AT BEDTIME AS NEEDED FOR SLEEP 30 tablet 1   No current facility-administered medications on file prior to visit.    Review of Systems:  As per HPI- otherwise negative.   Physical Examination: Vitals:   05/25/22 0849  BP: 118/60  Pulse: 73  Resp: 18  Temp: 97.7 F (36.5 C)  SpO2: 99%   Vitals:   05/25/22 0849  Weight: 110 lb 6.4 oz (50.1 kg)  Height: 5' (1.524 m)   Body mass index is 21.56 kg/m. Ideal Body Weight: Weight in (lb) to have BMI = 25: 127.7  GEN: no acute  distress. Slight build, looks well  HEENT: Atraumatic, Normocephalic.  Ears and Nose: No external deformity. CV: RRR, No M/G/R. No JVD. No thrill. No extra heart sounds. PULM: CTA B, no wheezes, crackles, rhonchi. No retractions. No resp. distress. No accessory muscle use. ABD: S, NT, ND EXTR: No c/c/e PSYCH: Normally interactive. Conversant.    Assessment and Plan: Acquired hypothyroidism - Plan: TSH  Prediabetes - Plan: Hemoglobin A1c, Comprehensive metabolic panel  Essential hypertension - Plan: Comprehensive metabolic panel  Mixed hyperlipidemia - Plan: Lipid panel  Fatigue, unspecified type - Plan: VITAMIN D  25 Hydroxy (Vit-D Deficiency, Fractures)  Adjustment insomnia - Plan: traZODone (DESYREL) 50 MG tablet  Pt seen today for periodic recheck Lab work pending as above. She has been seen by cardiology/advanced CHF clinic tomorrow to follow-up on reduced EF.  Per patient they are requesting a CMP, I will go ahead and draw this today Refill trazodone which she is using successfully for insomnia  Will plan further follow- up pending labs.  Signed Lamar Blinks, MD  Received labs as below, message to patient Results for orders placed or performed in visit on 05/25/22  Hemoglobin A1c  Result Value Ref Range   Hgb A1c MFr Bld 6.0 4.6 - 6.5 %  Lipid panel  Result Value Ref Range   Cholesterol 159 0 - 200 mg/dL   Triglycerides 129.0 0.0 - 149.0 mg/dL   HDL 52.00 >39.00 mg/dL   VLDL 25.8 0.0 - 40.0 mg/dL   LDL Cholesterol 81 0 - 99 mg/dL   Total CHOL/HDL Ratio 3    NonHDL 107.24   TSH  Result Value Ref Range   TSH 5.52 (H) 0.35 - 5.50 uIU/mL  VITAMIN D 25 Hydroxy (Vit-D Deficiency, Fractures)  Result Value Ref Range   VITD 51.07 30.00 - 100.00 ng/mL  Comprehensive metabolic panel  Result Value Ref Range   Sodium 141 135 - 145 mEq/L   Potassium 3.7 3.5 - 5.1 mEq/L   Chloride 106 96 - 112 mEq/L   CO2 26 19 - 32 mEq/L   Glucose, Bld 87 70 - 99 mg/dL   BUN 10 6  - 23 mg/dL   Creatinine, Ser 0.66 0.40 - 1.20 mg/dL   Total Bilirubin 0.4 0.2 - 1.2 mg/dL   Alkaline Phosphatase 47 39 - 117 U/L   AST 18 0 - 37 U/L   ALT 8 0 - 35 U/L   Total Protein 6.5 6.0 - 8.3 g/dL   Albumin 4.1 3.5 - 5.2 g/dL   GFR 99.41 >60.00 mL/min   Calcium 9.3 8.4 - 10.5 mg/dL

## 2022-05-13 NOTE — Patient Instructions (Addendum)
Good to see you again today I will be in touch with your labs asap Have a wonderful time in [place]!  Assuming all is well please see me in about 6 months Consider getting a covid booster if not done in the last 6 months or so

## 2022-05-15 ENCOUNTER — Other Ambulatory Visit: Payer: Self-pay

## 2022-05-20 ENCOUNTER — Ambulatory Visit: Payer: Managed Care, Other (non HMO) | Admitting: Family Medicine

## 2022-05-25 ENCOUNTER — Ambulatory Visit (INDEPENDENT_AMBULATORY_CARE_PROVIDER_SITE_OTHER): Payer: Managed Care, Other (non HMO) | Admitting: Family Medicine

## 2022-05-25 ENCOUNTER — Encounter: Payer: Self-pay | Admitting: Family Medicine

## 2022-05-25 VITALS — BP 118/60 | HR 73 | Temp 97.7°F | Resp 18 | Ht 60.0 in | Wt 110.4 lb

## 2022-05-25 DIAGNOSIS — E039 Hypothyroidism, unspecified: Secondary | ICD-10-CM

## 2022-05-25 DIAGNOSIS — R5383 Other fatigue: Secondary | ICD-10-CM | POA: Diagnosis not present

## 2022-05-25 DIAGNOSIS — E782 Mixed hyperlipidemia: Secondary | ICD-10-CM | POA: Diagnosis not present

## 2022-05-25 DIAGNOSIS — I10 Essential (primary) hypertension: Secondary | ICD-10-CM

## 2022-05-25 DIAGNOSIS — F5102 Adjustment insomnia: Secondary | ICD-10-CM

## 2022-05-25 DIAGNOSIS — R7303 Prediabetes: Secondary | ICD-10-CM

## 2022-05-25 LAB — LIPID PANEL
Cholesterol: 159 mg/dL (ref 0–200)
HDL: 52 mg/dL (ref >39.00)
LDL Cholesterol: 81 mg/dL (ref 0–99)
NonHDL: 107.24
Total CHOL/HDL Ratio: 3
Triglycerides: 129 mg/dL (ref 0.0–149.0)
VLDL: 25.8 mg/dL (ref 0.0–40.0)

## 2022-05-25 LAB — COMPREHENSIVE METABOLIC PANEL
ALT: 8 U/L (ref 0–35)
AST: 18 U/L (ref 0–37)
Albumin: 4.1 g/dL (ref 3.5–5.2)
Alkaline Phosphatase: 47 U/L (ref 39–117)
BUN: 10 mg/dL (ref 6–23)
CO2: 26 mEq/L (ref 19–32)
Calcium: 9.3 mg/dL (ref 8.4–10.5)
Chloride: 106 mEq/L (ref 96–112)
Creatinine, Ser: 0.66 mg/dL (ref 0.40–1.20)
GFR: 99.41 mL/min (ref >60.00)
Glucose, Bld: 87 mg/dL (ref 70–99)
Potassium: 3.7 mEq/L (ref 3.5–5.1)
Sodium: 141 mEq/L (ref 135–145)
Total Bilirubin: 0.4 mg/dL (ref 0.2–1.2)
Total Protein: 6.5 g/dL (ref 6.0–8.3)

## 2022-05-25 LAB — HEMOGLOBIN A1C: Hgb A1c MFr Bld: 6 % (ref 4.6–6.5)

## 2022-05-25 LAB — TSH: TSH: 5.52 u[IU]/mL — ABNORMAL HIGH (ref 0.35–5.50)

## 2022-05-25 LAB — VITAMIN D 25 HYDROXY (VIT D DEFICIENCY, FRACTURES): VITD: 51.07 ng/mL (ref 30.00–100.00)

## 2022-05-25 MED ORDER — TRAZODONE HCL 50 MG PO TABS: ORAL_TABLET | ORAL | 3 refills | Status: DC

## 2022-05-27 ENCOUNTER — Ambulatory Visit (HOSPITAL_COMMUNITY)
Admission: RE | Admit: 2022-05-27 | Discharge: 2022-05-27 | Disposition: A | Discharge status: Home / Self Care | Payer: Managed Care, Other (non HMO) | Source: Ambulatory Visit | Attending: Family Medicine | Admitting: Family Medicine

## 2022-05-27 ENCOUNTER — Encounter (HOSPITAL_COMMUNITY): Payer: Self-pay | Admitting: Cardiology

## 2022-05-27 ENCOUNTER — Ambulatory Visit (HOSPITAL_COMMUNITY)
Admission: RE | Admit: 2022-05-27 | Discharge: 2022-05-27 | Disposition: A | Discharge status: Home / Self Care | Payer: Managed Care, Other (non HMO) | Source: Ambulatory Visit | Attending: Cardiology | Admitting: Cardiology

## 2022-05-27 VITALS — BP 110/70 | HR 76 | Wt 111.0 lb

## 2022-05-27 DIAGNOSIS — Z170 Estrogen receptor positive status [ER+]: Secondary | ICD-10-CM | POA: Insufficient documentation

## 2022-05-27 DIAGNOSIS — C50411 Malignant neoplasm of upper-outer quadrant of right female breast: Secondary | ICD-10-CM

## 2022-05-27 DIAGNOSIS — Z0189 Encounter for other specified special examinations: Secondary | ICD-10-CM

## 2022-05-27 DIAGNOSIS — Z79899 Other long term (current) drug therapy: Secondary | ICD-10-CM | POA: Insufficient documentation

## 2022-05-27 DIAGNOSIS — Z01818 Encounter for other preprocedural examination: Secondary | ICD-10-CM | POA: Insufficient documentation

## 2022-05-27 DIAGNOSIS — I10 Essential (primary) hypertension: Secondary | ICD-10-CM | POA: Insufficient documentation

## 2022-05-27 DIAGNOSIS — E785 Hyperlipidemia, unspecified: Secondary | ICD-10-CM | POA: Insufficient documentation

## 2022-05-27 LAB — ECHOCARDIOGRAM COMPLETE
AR max vel: 2.73 cm2
AV Area VTI: 2.74 cm2
AV Area mean vel: 2.57 cm2
AV Mean grad: 4 mmHg
AV Peak grad: 6.4 mmHg
Ao pk vel: 1.27 m/s
Area-P 1/2: 3.53 cm2
S' Lateral: 2.8 cm

## 2022-05-27 MED ORDER — ENTRESTO 24-26 MG PO TABS: 1.0000 | ORAL_TABLET | Freq: Two times a day (BID) | ORAL | 3 refills | Status: DC

## 2022-05-27 NOTE — Progress Notes (Signed)
ADVANCED HF CLINIC CONSULT NOTE  Referring Physician: Dr Burr Medico Primary Care:Dr Copland  Primary HF Cardiologist: Dr Aundra Dubin   HPI: Dana Kramer is a 57 y.o. initially referred to the Cardio-Oncology clinic by Dr Burr Medico for reduced EF. She has a history of breast cancer, s/p bilateral mastectomies 01/2021 , and HTN.   R breast Cancer, Stage IA (pT1c, pN0, cM0, G2, ER+, PR+, HER2+).  Paclitaxel + Trastuzumab q7d / Trastuzumab q21d . Maintenance Herceptin injection q3weeks, starting 06/20/21. Plan to continue Herceptin for total 12 months.   Echo 04/2021 EF 60-65%  Echo 07/2021 EF 60-65% 23.2%  Echo 10/2021 EF 50-55% GLS 22.3%  Herceptin held with drop in EF on 8/23 echo.  She was continued on Toprol XL and started on Entresto.  Echo in 10/23 showed EF 60% with GLS -18.2%.  Echo in 12/23 showed EF 55% with mildly less negative GLS -19.9%, RV normal, IVC normal. Echo was done today and reviewed, EF 55-60%, GLS -16.4%, normal RV.   Patient has now completed Herceptin.  She denies exertional dyspnea or chest pain.  No lightheadedness.  BP well-controlled since starting Entresto. Weight down 2 lbs.    Labs (9/23): K 4.1, creatinine 0.8 Labs (3/24): K 3.7, creatinine 0.66  ECG (personally reviewed): NSR, normal  Review of Systems: All systems reviewed and negative except as per HPI.  Past Medical History:  Diagnosis Date   Allergy 11/1998   Penicillin   Chicken pox    History of transfusion of whole blood    Hyperlipidemia    Malignant neoplasm of upper-outer quadrant of right breast in female, estrogen receptor positive ([place]) 01/13/2021   Migraine    Thyroid disease    Hypothyroidism    Current Outpatient Medications  Medication Sig Dispense Refill   amLODipine (NORVASC) 5 MG tablet Take 1 tablet (5 mg total) by mouth daily. 90 tablet 1   b complex vitamins capsule Take 1 capsule by mouth daily.     Calcium Citrate-Vitamin D (CALCIUM + D PO) Take 1 tablet by mouth daily.      Cholecalciferol (VITAMIN D PO) Take 1 tablet by mouth daily.     levothyroxine (SYNTHROID) 25 MCG tablet TAKE 1 TABLET(25 MCG) BY MOUTH DAILY BEFORE BREAKFAST 90 tablet 3   meclizine (ANTIVERT) 25 MG tablet Take 1 tablet (25 mg total) by mouth 3 (three) times daily as needed for dizziness. 30 tablet 0   Melatonin 10 MG CAPS Take 10 mg by mouth at bedtime.     metoprolol succinate (TOPROL-XL) 50 MG 24 hr tablet TAKE 1 TABLET(50 MG) BY MOUTH DAILY WITH OR IMMEDIATELY FOLLOWING A MEAL 90 tablet 3   Multiple Vitamins-Minerals (WOMENS MULTIVITAMIN PO) Take 1 tablet by mouth daily.     simvastatin (ZOCOR) 20 MG tablet TAKE 1 TABLET(20 MG) BY MOUTH AT BEDTIME 90 tablet 3   tamoxifen (NOLVADEX) 20 MG tablet TAKE 1 TABLET(20 MG) BY MOUTH DAILY 90 tablet 1   traZODone (DESYREL) 50 MG tablet TAKE 1/2 TO 1 TABLET(25 TO 50 MG) BY MOUTH AT BEDTIME AS NEEDED FOR SLEEP 90 tablet 3   sacubitril-valsartan (ENTRESTO) 24-26 MG Take 1 tablet by mouth 2 (two) times daily. 180 tablet 3   No current facility-administered medications for this encounter.    Allergies  Allergen Reactions   Betadine [Povidone Iodine] Rash   Penicillins Rash    Mainly upper body only.      Social History   Socioeconomic History   Marital status: Married  Spouse name: Not on file   Number of children: 2   Years of education: Not on file   Highest education level: Not on file  Occupational History   Not on file  Tobacco Use   Smoking status: Never   Smokeless tobacco: Never  Vaping Use   Vaping Use: Never used  Substance and Sexual Activity   Alcohol use: Never   Drug use: Never   Sexual activity: Not Currently  Other Topics Concern   Not on file  Social History Narrative   Not on file   Social Determinants of Health   Financial Resource Strain: Not on file  Food Insecurity: Not on file  Transportation Needs: Not on file  Physical Activity: Not on file  Stress: Not on file  Social Connections: Not on file   Intimate Partner Violence: Not on file      Family History  Problem Relation Age of Onset   Hypertension Mother    Cancer Father 67       esophageal cancer   Leukemia Maternal Grandfather        dx. >50   Colon cancer Paternal Grandfather        dx. >50   Lung cancer Cousin        dx. 26s, did not smoke    Vitals:   05/27/22 0957  BP: 110/70  Pulse: 76  SpO2: 97%  Weight: 50.3 kg (111 lb)    PHYSICAL EXAM: General: NAD Neck: No JVD, no thyromegaly or thyroid nodule.  Lungs: Clear to auscultation bilaterally with normal respiratory effort. CV: Nondisplaced PMI.  Heart regular S1/S2, no S3/S4, no murmur.  No peripheral edema.  No carotid bruit.  Normal pedal pulses.  Abdomen: Soft, nontender, no hepatosplenomegaly, no distention.  Skin: Intact without lesions or rashes.  Neurologic: Alert and oriented x 3.  Psych: Normal affect. Extremities: No clubbing or cyanosis.  HEENT: Normal.  ASSESSMENT & PLAN:  1. Breast Cancer: Started herceptin 02/2021. Echo in 8/23 with EF down to 50-55%.  Herceptin was held and she was started on Entresto + Toprol XL.  Echo in 10/23 showed improvement in EF to 60%.  Echo in 12/23 showed EF 55% with mildly less negative strain at -19.9%.  Echo was done today, EF normal at 55-60%, strain again mildly less negative at -16.4%.  However, she is now off Herceptin. NYHA class I, no complaints.  - She does not need any further screening echoes now that she is off Herceptin.  - Continue current Toprol XL and Entresto. These meds help control her BP.  2. HTN: BP under better control since starting Entresto.   At this point,  she can followup as needed.  Reasonable to continue Toprol XL and Entresto for BP control.    [name] 05/27/2022

## 2022-05-27 NOTE — Patient Instructions (Signed)
Medication Changes:  No changes. Continue current regimen.   Entresto refills sent for 1 year.   Special Instructions // Education:  Do the following things EVERYDAY: Weigh yourself in the morning before breakfast. Write it down and keep it in a log. Take your medicines as prescribed Eat low salt foods--Limit salt (sodium) to 2000 mg per day.  Stay as active as you can everyday Limit all fluids for the day to less than 2 liters   Take baby aspirin daily 3 days prior to flight and continue until 3 days after return flight.    Follow-Up in: as needed.    CONGRATULATIONS on "graduating" from the Advanced heart failure clinic!! We wish you well!    At the [facility], you and your health needs are our priority. We have a designated team specialized in the treatment of Heart Failure. This Care Team includes your primary Heart Failure Specialized Cardiologist (physician), Advanced Practice Providers (APPs- Physician Assistants and Nurse Practitioners), and Pharmacist who all work together to provide you with the care you need, when you need it.   You may see any of the following providers on your designated Care Team at your next follow up:  Dr. Glori Bickers Dr. [name] Dr. Roxana Hires, NP Lyda Jester, [place] [facility] [place], [place] Forestine Na, NP Audry Riles, PharmD   Please be sure to bring in all your medications bottles to every appointment.   Need to Contact us:  If you have any questions or concerns before your next appointment please send [place] a message through [place] or call our office at [phone].    TO LEAVE A MESSAGE FOR THE NURSE SELECT OPTION 2, PLEASE LEAVE A MESSAGE INCLUDING: YOUR NAME DATE OF BIRTH CALL BACK NUMBER REASON FOR CALL**this is important as we prioritize the call backs  YOU WILL RECEIVE A CALL BACK THE SAME DAY AS LONG AS YOU CALL BEFORE 4:00 PM

## 2022-05-28 ENCOUNTER — Inpatient Hospital Stay: Payer: Managed Care, Other (non HMO) | Attending: Hematology | Admitting: Hematology

## 2022-05-28 ENCOUNTER — Inpatient Hospital Stay: Payer: Managed Care, Other (non HMO)

## 2022-05-28 ENCOUNTER — Encounter: Payer: Self-pay | Admitting: Hematology

## 2022-05-28 VITALS — BP 122/84 | HR 74 | Temp 98.1°F | Resp 18 | Ht 60.0 in | Wt 111.5 lb

## 2022-05-28 DIAGNOSIS — Z170 Estrogen receptor positive status [ER+]: Secondary | ICD-10-CM | POA: Diagnosis not present

## 2022-05-28 DIAGNOSIS — C50411 Malignant neoplasm of upper-outer quadrant of right female breast: Secondary | ICD-10-CM | POA: Diagnosis present

## 2022-05-28 DIAGNOSIS — Z9013 Acquired absence of bilateral breasts and nipples: Secondary | ICD-10-CM | POA: Diagnosis not present

## 2022-05-28 DIAGNOSIS — Z79810 Long term (current) use of selective estrogen receptor modulators (SERMs): Secondary | ICD-10-CM | POA: Insufficient documentation

## 2022-05-28 LAB — CBC WITH DIFFERENTIAL (CANCER CENTER ONLY)
Abs Immature Granulocytes: 0.01 10*3/uL (ref 0.00–0.07)
Basophils Absolute: 0 10*3/uL (ref 0.0–0.1)
Basophils Relative: 1 %
Eosinophils Absolute: 0.1 10*3/uL (ref 0.0–0.5)
Eosinophils Relative: 2 %
HCT: 33.3 % — ABNORMAL LOW (ref 36.0–46.0)
Hemoglobin: 11.5 g/dL — ABNORMAL LOW (ref 12.0–15.0)
Immature Granulocytes: 0 %
Lymphocytes Relative: 40 %
Lymphs Abs: 2.1 10*3/uL (ref 0.7–4.0)
MCH: 29.4 pg (ref 26.0–34.0)
MCHC: 34.5 g/dL (ref 30.0–36.0)
MCV: 85.2 fL (ref 80.0–100.0)
Monocytes Absolute: 0.3 10*3/uL (ref 0.1–1.0)
Monocytes Relative: 5 %
Neutro Abs: 2.8 10*3/uL (ref 1.7–7.7)
Neutrophils Relative %: 52 %
Platelet Count: 191 10*3/uL (ref 150–400)
RBC: 3.91 MIL/uL (ref 3.87–5.11)
RDW: 11.6 % (ref 11.5–15.5)
WBC Count: 5.4 10*3/uL (ref 4.0–10.5)
nRBC: 0 % (ref 0.0–0.2)

## 2022-05-28 LAB — CMP (CANCER CENTER ONLY)
ALT: 8 U/L (ref 0–44)
AST: 18 U/L (ref 15–41)
Albumin: 4.2 g/dL (ref 3.5–5.0)
Alkaline Phosphatase: 48 U/L (ref 38–126)
Anion gap: 6 (ref 5–15)
BUN: 18 mg/dL (ref 6–20)
CO2: 26 mmol/L (ref 22–32)
Calcium: 9.4 mg/dL (ref 8.9–10.3)
Chloride: 108 mmol/L (ref 98–111)
Creatinine: 0.72 mg/dL (ref 0.44–1.00)
GFR, Estimated: >60 mL/min (ref >60)
Glucose, Bld: 100 mg/dL — ABNORMAL HIGH (ref 70–99)
Potassium: 4.2 mmol/L (ref 3.5–5.1)
Sodium: 140 mmol/L (ref 135–145)
Total Bilirubin: 0.3 mg/dL (ref 0.3–1.2)
Total Protein: 6.7 g/dL (ref 6.5–8.1)

## 2022-05-28 NOTE — Assessment & Plan Note (Signed)
Stage IA, c(T1bN0M0), Triple positive, Grade 2 -found on screening mammogram. 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 -S/p b/l mastectomies on 01/23/21 with Dr. Brantley Stage. 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 Port was placed during surgery -Baseline EF 60-65% with impaired relaxation (grade I diastolic dysfunction). Will monitor q3 months on herceptin -s/p weekly Abraxane/Herceptin x12 02/25/21 - 05/12/21, -she began maintenance herceptin injection on 06/20/21. She is tolerating well. -she began tamoxifen on 06/20/21. She is tolerating well overall. She notes she has not had a period since completing chemo. We will check her hormone levels in 1-2 years and we can consider switching her to AI when she became postmenopausal. -repeat echo 10/21/21 showed slightly lower EF at 50-55%. She was seen by cardiologist and started on entresto and trastuzumab was held for a few months and restarted after improved EF on follow-ups echo. -She completed adjuvant trastuzumab on April 08, 2022 -Had repeated echo yesterday showed EF 55 to 60%, improved

## 2022-05-28 NOTE — Progress Notes (Signed)
[name]   [phone]) [phone] Fax:(336) [phone]   Clinic Follow up Note   Patient Care Team: Copland, Gay Filler, MD as PCP - General (Family Medicine) Erroll Luna, MD as Consulting Physician (General Surgery) Truitt Merle, MD as Consulting Physician (Hematology) Eppie Gibson, MD as Attending Physician (Radiation Oncology) Mauro Kaufmann, RN as Oncology Nurse Navigator [name], RN as Oncology Nurse Navigator Bensimhon, Shaune Pascal, MD as Consulting Physician (Cardiology)  Date of Service:  05/28/2022  CHIEF COMPLAINT: f/u of right breast cancer   CURRENT THERAPY:   -Herceptin, starting 02/25/21, currently maintenance injection q21d -tamoxifen, started 06/20/21  ASSESSMENT:  Dana Kramer is a 57 y.o. 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 -S/p b/l mastectomies on 01/23/21 with Dr. Brantley Stage. 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 Port was placed during surgery -Baseline EF 60-65% with impaired relaxation (grade I diastolic dysfunction). Will monitor q3 months on herceptin -s/p weekly Abraxane/Herceptin x12 02/25/21 - 05/12/21, -she began maintenance herceptin injection on 06/20/21. She is tolerating well. -she began tamoxifen on 06/20/21. She is tolerating well overall. She notes she has not had a period since completing chemo. We will check her hormone levels in 1-2 years and we can consider switching her to AI when she became postmenopausal. -repeat echo 10/21/21 showed slightly lower EF at 50-55%. She was seen by cardiologist and started on entresto and trastuzumab was held for a few months and restarted after improved EF on follow-ups echo. -She completed adjuvant trastuzumab on April 08, 2022 -Had  repeated echo yesterday showed EF 55 to 60%, improved  -She is clinically doing very well, exam was unremarkable, lab reviewed, no clinical concern for recurrence  HTN -Continue medication, her blood pressures is well-controlled.   PLAN: -Continue tamoxifen  -lab reviewed -Discuss Echo results -lab,f/u in 6 months   SUMMARY OF ONCOLOGIC HISTORY: Oncology History Overview Note   Cancer Staging  Malignant neoplasm of upper-outer quadrant of right breast in female, estrogen receptor positive ([place]) Staging form: Breast, AJCC 8th Edition - Clinical stage from 01/08/2021: Stage IA (cT1b, cN0, cM0, G2, ER+, PR+, HER2+) - Signed by Truitt Merle, MD on 01/15/2021 - Pathologic stage from 01/23/2021: Stage IA (pT1c, pN0, cM0, G2, ER+, PR+, HER2+) - Signed by Truitt Merle, MD on 02/13/2021     Malignant neoplasm of upper-outer quadrant of right breast in female, estrogen receptor positive ([place])  01/02/2021 Mammogram   EXAM: DIGITAL DIAGNOSTIC BILATERAL MAMMOGRAM WITH TOMOSYNTHESIS AND CAD; ULTRASOUND LEFT BREAST LIMITED; ULTRASOUND RIGHT BREAST LIMITED  IMPRESSION: 1. Highly suspicious 0.7 cm UPPER-OUTER RIGHT breast mass with associated suspicious calcifications extending 1.5 cm anteriorly. Tissue sampling is recommended. 2. No abnormal appearing RIGHT axillary lymph nodes. 3. Benign LOWER LEFT breast cyst corresponding to the LEFT breast screening study finding.   01/08/2021 Cancer Staging   Staging form: Breast, AJCC 8th Edition - Clinical stage from 01/08/2021: Stage IA (cT1b, cN0, cM0, G2, ER+, PR+, HER2+) - Signed by Truitt Merle, MD on 01/15/2021 Stage prefix: Initial diagnosis Nuclear grade: G2 Histologic grading system: 3 grade system   01/08/2021 Pathology Results   Diagnosis Breast, right, needle core biopsy, upper outer quadrant, 11 o'clock, 5cmfn, ribbon clip - INVASIVE DUCTAL CARCINOMA - DUCTAL CARCINOMA IN SITU WITH CALCIFICATIONS - SEE COMMENT Microscopic Comment  Based on the  biopsy, the carcinoma appears Nottingham grade 2 of 3 and measures 0.6 cm in greatest linear extent.  PROGNOSTIC INDICATORS Results: The tumor cells are 2+ for Her2 (EQUIVOCAL). Her2 by FISH will be performed and results reported separately. Estrogen Receptor: 100%, POSITIVE, STRONG STAINING INTENSITY Progesterone Receptor: 70%, POSITIVE, STRONG STAINING INTENSITY Proliferation Marker Ki67: 10%  FLUORESCENCE IN-SITU HYBRIDIZATION Results: GROUP 1: HER2 **POSITIVE**   01/13/2021 Initial Diagnosis   Malignant neoplasm of upper-outer quadrant of right breast in female, estrogen receptor positive ([place])   01/15/2021 Genetic Testing   [facility] CustomNext Panel was Negative. Report date is 01/26/2021.  The CustomNext-Cancer+RNAinsight panel offered by [facility] includes sequencing and rearrangement analysis for the following 47 genes:  APC, ATM, AXIN2, BARD1, BMPR1A, BRCA1, BRCA2, BRIP1, CDH1, CDK4, CDKN2A, CHEK2, CTNNA1, DICER1, EPCAM, GREM1, HOXB13, KIT, MEN1, MLH1, MSH2, MSH3, MSH6, MUTYH, NBN, NF1, NTHL1, PALB2, PDGFRA, PMS2, POLD1, POLE, PTEN, RAD50, RAD51C, RAD51D, SDHA, SDHB, SDHC, SDHD, SMAD4, SMARCA4, STK11, TP53, TSC1, TSC2, and VHL.  RNA data is routinely analyzed for use in variant interpretation for all genes.   01/23/2021 Cancer Staging   Staging form: Breast, AJCC 8th Edition - Pathologic stage from 01/23/2021: Stage IA (pT1c, pN0, cM0, G2, ER+, PR+, HER2+) - Signed by Truitt Merle, MD on 02/13/2021 Histologic grading system: 3 grade system Residual tumor (R): R0 - None   01/23/2021 Definitive Surgery   FINAL MICROSCOPIC DIAGNOSIS:   A. BREAST, LEFT, MASTECTOMY:  - Fibrocystic changes with sclerosing adenosis and calcifications.  - Fibroadenoma (0.6 cm).  - No evidence of malignancy.   B. BREAST, RIGHT, MASTECTOMY:  - Invasive and in situ ductal carcinoma, 1.7 cm.  - Margins negative for carcinoma.  - Biopsy site and biopsy clip.  - See oncology table.   C. LYMPH NODE,  RIGHT AXILLARY, SENTINEL, EXCISION:  - One lymph node negative for metastatic carcinoma (0/1).   D. LYMPH NODE, RIGHT AXILLARY, SENTINEL, EXCISION:  - One lymph node negative for metastatic carcinoma (0/1).   E. LYMPH NODE, RIGHT AXILLARY, SENTINEL, EXCISION:  - One lymph node negative for metastatic carcinoma (0/1).   F. LYMPH NODE, RIGHT AXILLARY, SENTINEL, EXCISION:  - One lymph node negative for metastatic carcinoma (0/1).    02/25/2021 - 10/22/2021 Chemotherapy   Patient is on Treatment Plan : BREAST Paclitaxel + Trastuzumab q7d / Trastuzumab q21d     02/25/2021 -  Chemotherapy   Patient is on Treatment Plan : BREAST Paclitaxel + Trastuzumab q7d / Trastuzumab q21d        INTERVAL HISTORY:  Dana Kramer is here for a follow up of right breast cancer  She was last seen by me on 02/25/2022 She presents to the clinic accompanied by daughter. Pt state she is doing well and she will be traveling on 06/02/2022. Pt report of having good appetite. Pt reports of some sharp pain on the right side if breast. Pt denies having side effects from Tamoxifen. Pt did report of some leg cramps , she state she had some every week if she stretches only at night.       All other systems were reviewed with the patient and are negative.  MEDICAL HISTORY:  Past Medical History:  Diagnosis Date   Allergy 11/1998   Penicillin   Chicken pox    History of transfusion of whole blood    Hyperlipidemia    Malignant neoplasm of upper-outer quadrant of right breast in female, estrogen receptor positive ([place]) 01/13/2021   Migraine  Thyroid disease    Hypothyroidism    SURGICAL HISTORY: Past Surgical History:  Procedure Laterality Date   ABDOMINOPLASTY  10/01/2010   CESAREAN SECTION     twice   HERNIA REPAIR     PORTACATH PLACEMENT Right 01/23/2021   Procedure: Right internal jugular 8 [place] PowerPort with C arm and ultrasound guidance;  Surgeon: Erroll Luna, MD;  Location: [place];  Service:  General;  Laterality: Right;   SENTINEL NODE BIOPSY Right 01/23/2021   Procedure: Right axillary sentinel lymph node mapping using Mag Trace;  Surgeon: Erroll Luna, MD;  Location: [place];  Service: General;  Laterality: Right;   SIMPLE MASTECTOMY WITH AXILLARY SENTINEL NODE BIOPSY Bilateral 01/23/2021   Procedure: Right simple mastectomy ;  Left risk reducing simple mastectomy;  Surgeon: Erroll Luna, MD;  Location: [place];  Service: General;  Laterality: Bilateral;   TUBAL LIGATION     umblica hernia repair  [id number]    I have reviewed the social history and family history with the patient and they are unchanged from previous note.  ALLERGIES:  is allergic to betadine [povidone iodine] and penicillins.  MEDICATIONS:  Current Outpatient Medications  Medication Sig Dispense Refill   amLODipine (NORVASC) 5 MG tablet Take 1 tablet (5 mg total) by mouth daily. 90 tablet 1   b complex vitamins capsule Take 1 capsule by mouth daily.     Calcium Citrate-Vitamin D (CALCIUM + D PO) Take 1 tablet by mouth daily.     Cholecalciferol (VITAMIN D PO) Take 1 tablet by mouth daily.     levothyroxine (SYNTHROID) 25 MCG tablet TAKE 1 TABLET(25 MCG) BY MOUTH DAILY BEFORE BREAKFAST 90 tablet 3   meclizine (ANTIVERT) 25 MG tablet Take 1 tablet (25 mg total) by mouth 3 (three) times daily as needed for dizziness. 30 tablet 0   Melatonin 10 MG CAPS Take 10 mg by mouth at bedtime.     metoprolol succinate (TOPROL-XL) 50 MG 24 hr tablet TAKE 1 TABLET(50 MG) BY MOUTH DAILY WITH OR IMMEDIATELY FOLLOWING A MEAL 90 tablet 3   Multiple Vitamins-Minerals (WOMENS MULTIVITAMIN PO) Take 1 tablet by mouth daily.     sacubitril-valsartan (ENTRESTO) 24-26 MG Take 1 tablet by mouth 2 (two) times daily. 180 tablet 3   simvastatin (ZOCOR) 20 MG tablet TAKE 1 TABLET(20 MG) BY MOUTH AT BEDTIME 90 tablet 3   tamoxifen (NOLVADEX) 20 MG tablet TAKE 1 TABLET(20 MG) BY MOUTH DAILY 90 tablet 1   traZODone (DESYREL) 50 MG tablet  TAKE 1/2 TO 1 TABLET(25 TO 50 MG) BY MOUTH AT BEDTIME AS NEEDED FOR SLEEP 90 tablet 3   No current facility-administered medications for this visit.    PHYSICAL EXAMINATION: ECOG PERFORMANCE STATUS: 0 - Asymptomatic  Vitals:   05/28/22 1255  BP: 122/84  Pulse: 74  Resp: 18  Temp: 98.1 F (36.7 C)  SpO2: 100%   Wt Readings from Last 3 Encounters:  05/28/22 111 lb 8 oz (50.6 kg)  05/27/22 111 lb (50.3 kg)  05/25/22 110 lb 6.4 oz (50.1 kg)       NECK:(-) supple, thyroid normal size, non-tender, without nodularity LYMPH: (-) no palpable lymphadenopathy in the cervical, axillary  HEART: regular rate & rhythm and no murmurs and (-)no lower extremity edema ABDOMEN:(-)abdomen soft,(-) non-tender and normal bowel sounds BREAST: bilateral mastectomy, no palpable mass, breast exam benign.     LABORATORY DATA:  I have reviewed the data as listed    Latest Ref Rng & Units 05/28/2022  12:29 PM 04/15/2022   10:50 AM 02/25/2022   12:49 PM  CBC  WBC 4.0 - 10.5 K/uL 5.4  5.4  7.0   Hemoglobin 12.0 - 15.0 g/dL 11.5  12.2  11.7   Hematocrit 36.0 - 46.0 % 33.3  35.5  34.1   Platelets 150 - 400 K/uL 191  229.0  219         Latest Ref Rng & Units 05/28/2022   12:29 PM 05/25/2022    9:11 AM 04/15/2022   10:50 AM  CMP  Glucose 70 - 99 mg/dL 100  87  91   BUN 6 - 20 mg/dL 18  10  10    Creatinine 0.44 - 1.00 mg/dL 0.72  0.66  0.72   Sodium 135 - 145 mmol/L 140  141  140   Potassium 3.5 - 5.1 mmol/L 4.2  3.7  4.3   Chloride 98 - 111 mmol/L 108  106  106   CO2 22 - 32 mmol/L 26  26  27    Calcium 8.9 - 10.3 mg/dL 9.4  9.3  9.2   Total Protein 6.5 - 8.1 g/dL 6.7  6.5  6.9   Total Bilirubin 0.3 - 1.2 mg/dL 0.3  0.4  0.3   Alkaline Phos 38 - 126 U/L 48  47  50   AST 15 - 41 U/L 18  18  20    ALT 0 - 44 U/L 8  8  8        RADIOGRAPHIC STUDIES: I have personally reviewed the radiological images as listed and agreed with the findings in the report. ECHOCARDIOGRAM COMPLETE  Result Date:  05/27/2022    ECHOCARDIOGRAM REPORT   Patient Name:   Dana Kramer  Date of Exam: 05/27/2022 Medical Rec #:  [id number]  Height:       60.0 in Accession #:    [id number] Weight:       110.4 lb Date of Birth:  11-Oct-1965  BSA:          1.450 m Patient Age:    57 years   BP:           118/60 mmHg Patient Gender: F          HR:           75 bpm. Exam Location:  Outpatient Procedure: 2D Echo, Color Doppler, Cardiac Doppler and Strain Analysis Indications:    Malignant Neoplasm of Right Breast C50.411  History:        Patient has prior history of Echocardiogram examinations. Breast                 CA; Risk Factors:Dyslipidemia.  Sonographer:    L. Thornton-Maynard Referring Phys: [place]  1. Left ventricular ejection fraction, by estimation, is 55 to 60%. The left ventricle has normal function. The left ventricle has no regional wall motion abnormalities. Left ventricular diastolic parameters are consistent with Grade I diastolic dysfunction (impaired relaxation). The average left ventricular global longitudinal strain is -16.4 %.  2. Right ventricular systolic function is normal. The right ventricular size is normal. There is normal pulmonary artery systolic pressure. The estimated right ventricular systolic pressure is 0000000 mmHg.  3. The mitral valve is normal in structure. Trivial mitral valve regurgitation. No evidence of mitral stenosis.  4. The aortic valve is tricuspid. Aortic valve regurgitation is not visualized. No aortic stenosis is present.  5. The inferior vena cava is normal in size with greater than 50% respiratory  variability, suggesting right atrial pressure of 3 mmHg. FINDINGS  Left Ventricle: Left ventricular ejection fraction, by estimation, is 55 to 60%. The left ventricle has normal function. The left ventricle has no regional wall motion abnormalities. The average left ventricular global longitudinal strain is -16.4 %. The left ventricular internal cavity size was normal in  size. There is no left ventricular hypertrophy. Left ventricular diastolic parameters are consistent with Grade I diastolic dysfunction (impaired relaxation). Right Ventricle: The right ventricular size is normal. No increase in right ventricular wall thickness. Right ventricular systolic function is normal. There is normal pulmonary artery systolic pressure. The tricuspid regurgitant velocity is 2.39 m/s, and  with an assumed right atrial pressure of 3 mmHg, the estimated right ventricular systolic pressure is 0000000 mmHg. Left Atrium: Left atrial size was normal in size. Right Atrium: Right atrial size was normal in size. Pericardium: There is no evidence of pericardial effusion. Mitral Valve: The mitral valve is normal in structure. Trivial mitral valve regurgitation. No evidence of mitral valve stenosis. Tricuspid Valve: The tricuspid valve is normal in structure. Tricuspid valve regurgitation is trivial. Aortic Valve: The aortic valve is tricuspid. Aortic valve regurgitation is not visualized. No aortic stenosis is present. Aortic valve mean gradient measures 4.0 mmHg. Aortic valve peak gradient measures 6.4 mmHg. Aortic valve area, by VTI measures 2.74 cm. Pulmonic Valve: The pulmonic valve was normal in structure. Pulmonic valve regurgitation is not visualized. Aorta: The aortic root is normal in size and structure. Venous: The inferior vena cava is normal in size with greater than 50% respiratory variability, suggesting right atrial pressure of 3 mmHg. IAS/Shunts: No atrial level shunt detected by color flow Doppler.  LEFT VENTRICLE PLAX 2D LVIDd:         4.30 cm   Diastology LVIDs:         2.80 cm   LV e' medial:    5.33 cm/s LV PW:         0.80 cm   LV E/e' medial:  15.3 LV IVS:        0.90 cm   LV e' lateral:   8.05 cm/s LVOT diam:     2.00 cm   LV E/e' lateral: 10.1 LV SV:         66 LV SV Index:   45        2D Longitudinal Strain LVOT Area:     3.14 cm  2D Strain GLS Avg:     -16.4 %  RIGHT VENTRICLE RV  Basal diam:  2.30 cm RV S prime:     10.40 cm/s TAPSE (M-mode): 1.7 cm LEFT ATRIUM             Index        RIGHT ATRIUM          Index LA diam:        3.00 cm 2.07 cm/m   RA Area:     9.87 cm LA Vol (A2C):   32.3 ml 22.28 ml/m  RA Volume:   19.20 ml 13.24 ml/m LA Vol (A4C):   25.6 ml 17.65 ml/m LA Biplane Vol: 29.7 ml 20.48 ml/m  AORTIC VALVE                    PULMONIC VALVE AV Area (Vmax):    2.73 cm     PV Vmax:       0.91 m/s AV Area (Vmean):   2.57 cm     PV Peak  grad:  3.3 mmHg AV Area (VTI):     2.74 cm AV Vmax:           126.50 cm/s AV Vmean:          93.750 cm/s AV VTI:            0.240 m AV Peak Grad:      6.4 mmHg AV Mean Grad:      4.0 mmHg LVOT Vmax:         110.00 cm/s LVOT Vmean:        76.700 cm/s LVOT VTI:          0.209 m LVOT/AV VTI ratio: 0.87  AORTA Ao Root diam: 3.60 cm Ao Asc diam:  3.20 cm MITRAL VALVE               TRICUSPID VALVE MV Area (PHT): 3.53 cm    TR Peak grad:   22.8 mmHg MV Decel Time: 215 msec    TR Vmax:        239.00 cm/s MV E velocity: 81.30 cm/s MV A velocity: 81.30 cm/s  SHUNTS MV E/A ratio:  1.00        Systemic VTI:  0.21 m                            Systemic Diam: 2.00 cm Dalton McleanMD Electronically signed by Franki Monte Signature Date/Time: 05/27/2022/10:10:19 AM    Final       No orders of the defined types were placed in this encounter.  All questions were answered. The patient knows to call the clinic with any problems, questions or concerns. No barriers to learning was detected. The total time spent in the appointment was 25 minutes.     Truitt Merle, MD 05/28/2022   Felicity Coyer, CMA, am acting as scribe for Truitt Merle, MD.   I have reviewed the above documentation for accuracy and completeness, and I agree with the above.

## 2022-05-29 ENCOUNTER — Other Ambulatory Visit (INDEPENDENT_AMBULATORY_CARE_PROVIDER_SITE_OTHER): Payer: Managed Care, Other (non HMO)

## 2022-05-29 ENCOUNTER — Encounter: Payer: Self-pay | Admitting: Family Medicine

## 2022-05-29 DIAGNOSIS — E039 Hypothyroidism, unspecified: Secondary | ICD-10-CM | POA: Diagnosis not present

## 2022-05-29 LAB — TSH: TSH: 5.27 u[IU]/mL (ref 0.35–5.50)

## 2022-06-09 ENCOUNTER — Other Ambulatory Visit: Payer: Self-pay

## 2022-07-29 ENCOUNTER — Ambulatory Visit: Payer: Managed Care, Other (non HMO) | Attending: Surgery | Admitting: Rehabilitation

## 2022-07-29 ENCOUNTER — Encounter: Payer: Self-pay | Admitting: Rehabilitation

## 2022-07-29 DIAGNOSIS — C50411 Malignant neoplasm of upper-outer quadrant of right female breast: Secondary | ICD-10-CM | POA: Insufficient documentation

## 2022-07-29 DIAGNOSIS — Z170 Estrogen receptor positive status [ER+]: Secondary | ICD-10-CM | POA: Insufficient documentation

## 2022-07-29 DIAGNOSIS — Z483 Aftercare following surgery for neoplasm: Secondary | ICD-10-CM | POA: Insufficient documentation

## 2022-07-29 NOTE — Therapy (Signed)
  OUTPATIENT PHYSICAL THERAPY [facility] SCREENING NOTE   Patient Name: Dana Kramer MRN: [id number] DOB:13-Jul-1965, 57 y.o., female Today's Date: 07/29/2022  PCP: Pearline Cables, MD REFERRING PROVIDER: Harriette Bouillon, MD   PT End of Session - 07/29/22 1202     Visit Number 11   screen only   PT Start Time 1200    PT Stop Time 1203    PT Time Calculation (min) 3 min    Activity Tolerance Patient tolerated treatment well    Behavior During Therapy [facility] for tasks assessed/performed             Past Medical History:  Diagnosis Date   Allergy 11/1998   Penicillin   Chicken pox    History of transfusion of whole blood    Hyperlipidemia    Malignant neoplasm of upper-outer quadrant of right breast in female, estrogen receptor positive ([facility]) 01/13/2021   Migraine    Thyroid disease    Hypothyroidism   Past Surgical History:  Procedure Laterality Date   ABDOMINOPLASTY  10/01/2010   CESAREAN SECTION     twice   HERNIA REPAIR     PORTACATH PLACEMENT Right 01/23/2021   Procedure: Right internal jugular 8 [place] PowerPort with C arm and ultrasound guidance;  Surgeon: Harriette Bouillon, MD;  Location: [facility];  Service: General;  Laterality: Right;   SENTINEL NODE BIOPSY Right 01/23/2021   Procedure: Right axillary sentinel lymph node mapping using Mag Trace;  Surgeon: Harriette Bouillon, MD;  Location: [facility];  Service: General;  Laterality: Right;   SIMPLE MASTECTOMY WITH AXILLARY SENTINEL NODE BIOPSY Bilateral 01/23/2021   Procedure: Right simple mastectomy ;  Left risk reducing simple mastectomy;  Surgeon: Harriette Bouillon, MD;  Location: [facility];  Service: General;  Laterality: Bilateral;   TUBAL LIGATION     umblica hernia repair  10/01/2010   Patient Active Problem List   Diagnosis Date Noted   Port-A-Cath in place 03/04/2021   Genetic testing 01/27/2021   S/P mastectomy, bilateral 01/23/2021   Family history of colon cancer 01/15/2021   Malignant neoplasm of upper-outer quadrant of  right breast in female, estrogen receptor positive ([facility]) 01/13/2021   Pre-diabetes 11/06/2020   Acquired hypothyroidism 04/14/2018   Migraine 01/18/2015   Insomnia 11/05/2011   Anxiety 11/05/2011   Hyperlipidemia 11/05/2011   Tachycardia 11/05/2011   Umbilical hernia 11/19/2010    REFERRING DIAG: right breast cancer at risk for lymphedema  THERAPY DIAG:  Aftercare following surgery for neoplasm  Malignant neoplasm of upper-outer quadrant of right breast in female, estrogen receptor positive ([facility])  PRECAUTIONS: right UE Lymphedema risk,   SUBJECTIVE: Doing well lately  PAIN:  Are you having pain? No  SOZO SCREENING: Patient was assessed today using the SOZO machine to determine the lymphedema index score. This was compared to her baseline score. It was determined that she is within the recommended range when compared to her baseline and no further action is needed at this time. She will continue SOZO screenings. These are done every 3 months for 2 years post operatively followed by every 6 months for 2 years, and then annually.   Idamae Lusher, PT 07/29/2022, 12:03 PM

## 2022-08-02 ENCOUNTER — Other Ambulatory Visit: Payer: Self-pay | Admitting: Family Medicine

## 2022-08-02 DIAGNOSIS — I10 Essential (primary) hypertension: Secondary | ICD-10-CM

## 2022-08-03 ENCOUNTER — Other Ambulatory Visit: Payer: Self-pay

## 2022-08-05 ENCOUNTER — Other Ambulatory Visit: Payer: Self-pay | Admitting: Family Medicine

## 2022-08-05 DIAGNOSIS — E785 Hyperlipidemia, unspecified: Secondary | ICD-10-CM

## 2022-08-07 ENCOUNTER — Other Ambulatory Visit: Payer: Self-pay

## 2022-08-10 ENCOUNTER — Encounter: Payer: Self-pay | Admitting: Family Medicine

## 2022-10-13 ENCOUNTER — Inpatient Hospital Stay: Payer: Managed Care, Other (non HMO) | Attending: Hematology

## 2022-10-13 ENCOUNTER — Inpatient Hospital Stay: Payer: Managed Care, Other (non HMO) | Admitting: Hematology

## 2022-10-13 ENCOUNTER — Other Ambulatory Visit: Payer: Self-pay

## 2022-10-13 ENCOUNTER — Encounter: Payer: Self-pay | Admitting: Hematology

## 2022-10-13 VITALS — BP 120/78 | HR 79 | Temp 98.0°F | Resp 15 | Ht 60.0 in | Wt 112.3 lb

## 2022-10-13 DIAGNOSIS — Z170 Estrogen receptor positive status [ER+]: Secondary | ICD-10-CM | POA: Diagnosis not present

## 2022-10-13 DIAGNOSIS — R5383 Other fatigue: Secondary | ICD-10-CM | POA: Diagnosis not present

## 2022-10-13 DIAGNOSIS — C50411 Malignant neoplasm of upper-outer quadrant of right female breast: Secondary | ICD-10-CM

## 2022-10-13 DIAGNOSIS — Z139 Encounter for screening, unspecified: Secondary | ICD-10-CM

## 2022-10-13 LAB — CMP (CANCER CENTER ONLY)
ALT: 9 U/L (ref 0–44)
AST: 22 U/L (ref 15–41)
Albumin: 4.2 g/dL (ref 3.5–5.0)
Alkaline Phosphatase: 49 U/L (ref 38–126)
Anion gap: 6 (ref 5–15)
BUN: 11 mg/dL (ref 6–20)
CO2: 27 mmol/L (ref 22–32)
Calcium: 9.1 mg/dL (ref 8.9–10.3)
Chloride: 106 mmol/L (ref 98–111)
Creatinine: 0.78 mg/dL (ref 0.44–1.00)
GFR, Estimated: >60 mL/min (ref >60)
Glucose, Bld: 119 mg/dL — ABNORMAL HIGH (ref 70–99)
Potassium: 4 mmol/L (ref 3.5–5.1)
Sodium: 139 mmol/L (ref 135–145)
Total Bilirubin: 0.3 mg/dL (ref 0.3–1.2)
Total Protein: 6.8 g/dL (ref 6.5–8.1)

## 2022-10-13 LAB — CBC WITH DIFFERENTIAL (CANCER CENTER ONLY)
Abs Immature Granulocytes: 0.01 10*3/uL (ref 0.00–0.07)
Basophils Absolute: 0 10*3/uL (ref 0.0–0.1)
Basophils Relative: 1 %
Eosinophils Absolute: 0.1 10*3/uL (ref 0.0–0.5)
Eosinophils Relative: 2 %
HCT: 32.9 % — ABNORMAL LOW (ref 36.0–46.0)
Hemoglobin: 11.4 g/dL — ABNORMAL LOW (ref 12.0–15.0)
Immature Granulocytes: 0 %
Lymphocytes Relative: 49 %
Lymphs Abs: 3.1 10*3/uL (ref 0.7–4.0)
MCH: 29.3 pg (ref 26.0–34.0)
MCHC: 34.7 g/dL (ref 30.0–36.0)
MCV: 84.6 fL (ref 80.0–100.0)
Monocytes Absolute: 0.4 10*3/uL (ref 0.1–1.0)
Monocytes Relative: 7 %
Neutro Abs: 2.5 10*3/uL (ref 1.7–7.7)
Neutrophils Relative %: 41 %
Platelet Count: 191 10*3/uL (ref 150–400)
RBC: 3.89 MIL/uL (ref 3.87–5.11)
RDW: 11.9 % (ref 11.5–15.5)
WBC Count: 6.1 10*3/uL (ref 4.0–10.5)
nRBC: 0 % (ref 0.0–0.2)

## 2022-10-13 NOTE — Assessment & Plan Note (Signed)
Stage IA, c(T1bN0M0), Triple positive, Grade 2 -found on screening mammogram. 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 -S/p b/l mastectomies on 01/23/21 with Dr. Luisa Hart. 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 Port was placed during surgery -Baseline EF 60-65% with impaired relaxation (grade I diastolic dysfunction). Will monitor q3 months on herceptin -s/p weekly Abraxane/Herceptin x12 02/25/21 - 05/12/21, -she completed one year maintenance herceptin injection in Jan 2024 -she began tamoxifen on 06/20/21. She is tolerating well overall. She notes she has not had a period since completing chemo. We will check her hormone levels in 1-2 years and we can consider switching her to AI when she became postmenopausal. -repeat echo 10/21/21 showed slightly lower EF at 50-55%. She was seen by cardiologist and started on entresto and trastuzumab was held for a few months and restarted after improved EF on follow-ups echo.

## 2022-10-13 NOTE — Progress Notes (Signed)
[facility]   [phone] [phone]   Clinic Follow up Note   Patient Care Team: Copland, Gwenlyn Found, MD as PCP - General (Family Medicine) Harriette Bouillon, MD as Consulting Physician (General Surgery) Malachy Mood, MD as Consulting Physician (Hematology) Lonie Peak, MD as Attending Physician (Radiation Oncology) Pershing Proud, RN as Oncology Nurse Navigator Donnelly Angelica, RN as Oncology Nurse Navigator Bensimhon, Bevelyn Buckles, MD as Consulting Physician (Cardiology)  Date of Service:  10/13/2022  CHIEF COMPLAINT: f/u of right breast cancer     CURRENT THERAPY:  -tamoxifen, started 06/20/21  ASSESSMENT:  Dana Kramer is a 57 y.o. female with   Malignant neoplasm of upper-outer quadrant of right breast in female, estrogen receptor positive (HCC) Stage IA, c(T1bN0M0), Triple positive, Grade 2 -found on screening mammogram. 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 -S/p b/l mastectomies on 01/23/21 with Dr. Luisa Hart. 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 Port was placed during surgery -Baseline EF 60-65% with impaired relaxation (grade I diastolic dysfunction). Will monitor q3 months on herceptin -s/p weekly Abraxane/Herceptin x12 02/25/21 - 05/12/21, -she completed one year maintenance herceptin injection in Jan 2024 -she began tamoxifen on 06/20/21. She is tolerating well overall. She notes she has not had a period since completing chemo. We will check her hormone levels in 1-2 years and we can consider switching her to AI when she became postmenopausal. -repeat echo 10/21/21 showed slightly lower EF at 50-55%. She was seen by cardiologist and started on entresto and trastuzumab was held for a few months and restarted after improved EF on follow-ups echo.   Fatigue and mid back pain  -She reports intermittent mid back pain for past few months.  Overall mild and that she has not required any pain  medication.  We discussed obtaining MRI scan for further evaluation, also my suspicion for pulm metastasis is not high.  She wants to hold it for now -She also reports mild to moderate fatigue, possibility of tamoxifen, she is able to function very well and continues cardio workout gym.   PLAN: -lab reviewed -f/u scheduled 11/2022 w/ PCP -I order Bone Density -lab and f/u in 6 months, she knows to call me if she has worsening back pain, I will see her sooner.  SUMMARY OF ONCOLOGIC HISTORY: Oncology History Overview Note   Cancer Staging  Malignant neoplasm of upper-outer quadrant of right breast in female, estrogen receptor positive (HCC) Staging form: Breast, AJCC 8th Edition - Clinical stage from 01/08/2021: Stage IA (cT1b, cN0, cM0, G2, ER+, PR+, HER2+) - Signed by Malachy Mood, MD on 01/15/2021 - Pathologic stage from 01/23/2021: Stage IA (pT1c, pN0, cM0, G2, ER+, PR+, HER2+) - Signed by Malachy Mood, MD on 02/13/2021     Malignant neoplasm of upper-outer quadrant of right breast in female, estrogen receptor positive (HCC)  01/02/2021 Mammogram   EXAM: DIGITAL DIAGNOSTIC BILATERAL MAMMOGRAM WITH TOMOSYNTHESIS AND CAD; ULTRASOUND LEFT BREAST LIMITED; ULTRASOUND RIGHT BREAST LIMITED  IMPRESSION: 1. Highly suspicious 0.7 cm UPPER-OUTER RIGHT breast mass with associated suspicious calcifications extending 1.5 cm anteriorly. Tissue sampling is recommended. 2. No abnormal appearing RIGHT axillary lymph nodes. 3. Benign LOWER LEFT breast cyst corresponding to the LEFT breast screening study finding.   01/08/2021 Cancer Staging   Staging form: Breast, AJCC 8th Edition - Clinical stage from 01/08/2021: Stage IA (cT1b, cN0, cM0, G2, ER+, PR+, HER2+) - Signed by Malachy Mood, MD on 01/15/2021 Stage prefix: Initial  diagnosis Nuclear grade: G2 Histologic grading system: 3 grade system   01/08/2021 Pathology Results   Diagnosis Breast, right, needle core biopsy, upper outer quadrant, 11 o'clock, 5cmfn,  ribbon clip - INVASIVE DUCTAL CARCINOMA - DUCTAL CARCINOMA IN SITU WITH CALCIFICATIONS - SEE COMMENT Microscopic Comment Based on the biopsy, the carcinoma appears Nottingham grade 2 of 3 and measures 0.6 cm in greatest linear extent.  PROGNOSTIC INDICATORS Results: The tumor cells are 2+ for Her2 (EQUIVOCAL). Her2 by FISH will be performed and results reported separately. Estrogen Receptor: 100%, POSITIVE, STRONG STAINING INTENSITY Progesterone Receptor: 70%, POSITIVE, STRONG STAINING INTENSITY Proliferation Marker Ki67: 10%  FLUORESCENCE IN-SITU HYBRIDIZATION Results: GROUP 1: HER2 **POSITIVE**   01/13/2021 Initial Diagnosis   Malignant neoplasm of upper-outer quadrant of right breast in female, estrogen receptor positive (HCC)   01/15/2021 Genetic Testing   [facility] CustomNext Panel was Negative. Report date is 01/26/2021.  The CustomNext-Cancer+RNAinsight panel offered by [employer] includes sequencing and rearrangement analysis for the following 47 genes:  APC, ATM, AXIN2, BARD1, BMPR1A, BRCA1, BRCA2, BRIP1, CDH1, CDK4, CDKN2A, CHEK2, CTNNA1, DICER1, EPCAM, GREM1, HOXB13, KIT, MEN1, MLH1, MSH2, MSH3, MSH6, MUTYH, NBN, NF1, NTHL1, PALB2, PDGFRA, PMS2, POLD1, POLE, PTEN, RAD50, RAD51C, RAD51D, SDHA, SDHB, SDHC, SDHD, SMAD4, SMARCA4, STK11, TP53, TSC1, TSC2, and VHL.  RNA data is routinely analyzed for use in variant interpretation for all genes.   01/23/2021 Cancer Staging   Staging form: Breast, AJCC 8th Edition - Pathologic stage from 01/23/2021: Stage IA (pT1c, pN0, cM0, G2, ER+, PR+, HER2+) - Signed by Malachy Mood, MD on 02/13/2021 Histologic grading system: 3 grade system Residual tumor (R): R0 - None   01/23/2021 Definitive Surgery   FINAL MICROSCOPIC DIAGNOSIS:   A. BREAST, LEFT, MASTECTOMY:  - Fibrocystic changes with sclerosing adenosis and calcifications.  - Fibroadenoma (0.6 cm).  - No evidence of malignancy.   B. BREAST, RIGHT, MASTECTOMY:  - Invasive and in situ  ductal carcinoma, 1.7 cm.  - Margins negative for carcinoma.  - Biopsy site and biopsy clip.  - See oncology table.   C. LYMPH NODE, RIGHT AXILLARY, SENTINEL, EXCISION:  - One lymph node negative for metastatic carcinoma (0/1).   D. LYMPH NODE, RIGHT AXILLARY, SENTINEL, EXCISION:  - One lymph node negative for metastatic carcinoma (0/1).   E. LYMPH NODE, RIGHT AXILLARY, SENTINEL, EXCISION:  - One lymph node negative for metastatic carcinoma (0/1).   F. LYMPH NODE, RIGHT AXILLARY, SENTINEL, EXCISION:  - One lymph node negative for metastatic carcinoma (0/1).    02/25/2021 - 10/22/2021 Chemotherapy   Patient is on Treatment Plan : BREAST Paclitaxel + Trastuzumab q7d / Trastuzumab q21d     02/25/2021 -  Chemotherapy   Patient is on Treatment Plan : BREAST Paclitaxel + Trastuzumab q7d / Trastuzumab q21d        INTERVAL HISTORY:  Dana Kramer is here for a follow up of right breast cancer . She was last seen by me on 05/28/2022. She presents to the clinic accompanied by daughter. Pt state that she fatigue. Pt has lower back pain and she had the pain for 2 months. Pt state that its intermittent Pt report when the pain comes it last for three days.   All other systems were reviewed with the patient and are negative.  MEDICAL HISTORY:  Past Medical History:  Diagnosis Date   Allergy 11/1998   Penicillin   Chicken pox    History of transfusion of whole blood    Hyperlipidemia    Malignant  neoplasm of upper-outer quadrant of right breast in female, estrogen receptor positive (HCC) 01/13/2021   Migraine    Thyroid disease    Hypothyroidism    SURGICAL HISTORY: Past Surgical History:  Procedure Laterality Date   ABDOMINOPLASTY  10/01/2010   CESAREAN SECTION     twice   HERNIA REPAIR     PORTACATH PLACEMENT Right 01/23/2021   Procedure: Right internal jugular 8 [place] PowerPort with C arm and ultrasound guidance;  Surgeon: Harriette Bouillon, MD;  Location: [facility] OR;  Service: General;   Laterality: Right;   SENTINEL NODE BIOPSY Right 01/23/2021   Procedure: Right axillary sentinel lymph node mapping using Mag Trace;  Surgeon: Harriette Bouillon, MD;  Location: [facility] OR;  Service: General;  Laterality: Right;   SIMPLE MASTECTOMY WITH AXILLARY SENTINEL NODE BIOPSY Bilateral 01/23/2021   Procedure: Right simple mastectomy ;  Left risk reducing simple mastectomy;  Surgeon: Harriette Bouillon, MD;  Location: [facility] OR;  Service: General;  Laterality: Bilateral;   TUBAL LIGATION     umblica hernia repair  10/01/2010    I have reviewed the social history and family history with the patient and they are unchanged from previous note.  ALLERGIES:  is allergic to betadine [povidone iodine] and penicillins.  MEDICATIONS:  Current Outpatient Medications  Medication Sig Dispense Refill   amLODipine (NORVASC) 5 MG tablet TAKE 1 TABLET(5 MG) BY MOUTH DAILY 90 tablet 1   b complex vitamins capsule Take 1 capsule by mouth daily.     Calcium Citrate-Vitamin D (CALCIUM + D PO) Take 1 tablet by mouth daily.     Cholecalciferol (VITAMIN D PO) Take 1 tablet by mouth daily.     levothyroxine (SYNTHROID) 25 MCG tablet TAKE 1 TABLET(25 MCG) BY MOUTH DAILY BEFORE BREAKFAST 90 tablet 3   meclizine (ANTIVERT) 25 MG tablet Take 1 tablet (25 mg total) by mouth 3 (three) times daily as needed for dizziness. 30 tablet 0   Melatonin 10 MG CAPS Take 10 mg by mouth at bedtime.     metoprolol succinate (TOPROL-XL) 50 MG 24 hr tablet TAKE 1 TABLET(50 MG) BY MOUTH DAILY WITH OR IMMEDIATELY FOLLOWING A MEAL 90 tablet 3   Multiple Vitamins-Minerals (WOMENS MULTIVITAMIN PO) Take 1 tablet by mouth daily.     sacubitril-valsartan (ENTRESTO) 24-26 MG Take 1 tablet by mouth 2 (two) times daily. 180 tablet 3   simvastatin (ZOCOR) 20 MG tablet TAKE 1 TABLET(20 MG) BY MOUTH AT BEDTIME 90 tablet 3   tamoxifen (NOLVADEX) 20 MG tablet TAKE 1 TABLET(20 MG) BY MOUTH DAILY 90 tablet 1   traZODone (DESYREL) 50 MG tablet TAKE 1/2 TO 1  TABLET(25 TO 50 MG) BY MOUTH AT BEDTIME AS NEEDED FOR SLEEP 90 tablet 3   No current facility-administered medications for this visit.    PHYSICAL EXAMINATION: ECOG PERFORMANCE STATUS: 1 - Symptomatic but completely ambulatory  Vitals:   10/13/22 1112  BP: 120/78  Pulse: 79  Resp: 15  Temp: 98 F (36.7 C)  SpO2: 99%   Wt Readings from Last 3 Encounters:  10/13/22 112 lb 4.8 oz (50.9 kg)  05/28/22 111 lb 8 oz (50.6 kg)  05/27/22 111 lb (50.3 kg)     GENERAL:alert, no distress and comfortable SKIN: skin color normal, no rashes or significant lesions EYES: normal, Conjunctiva are pink and non-injected, sclera clear  NEURO: alert & oriented x 3 with fluent speech NECK: (-) supple, thyroid normal size, non-tender, without nodularity LYMPH:  (-) no palpable lymphadenopathy in the cervical,  axillary  LUNGS: (-) clear to auscultation and percussion with normal breathing effort HEART:(-) regular rate & rhythm and (-)no murmurs and no lower extremity edema BREAST: bilateral MASTECTOMY, no palpable mass, breast exam benign LABORATORY DATA:  I have reviewed the data as listed    Latest Ref Rng & Units 10/13/2022   10:30 AM 05/28/2022   12:29 PM 04/15/2022   10:50 AM  CBC  WBC 4.0 - 10.5 K/uL 6.1  5.4  5.4   Hemoglobin 12.0 - 15.0 g/dL 81.1  91.4  78.2   Hematocrit 36.0 - 46.0 % 32.9  33.3  35.5   Platelets 150 - 400 K/uL 191  191  229.0         Latest Ref Rng & Units 10/13/2022   10:30 AM 05/28/2022   12:29 PM 05/25/2022    9:11 AM  CMP  Glucose 70 - 99 mg/dL 956  213  87   BUN 6 - 20 mg/dL 11  18  10    Creatinine 0.44 - 1.00 mg/dL 0.86  5.78  4.69   Sodium 135 - 145 mmol/L 139  140  141   Potassium 3.5 - 5.1 mmol/L 4.0  4.2  3.7   Chloride 98 - 111 mmol/L 106  108  106   CO2 22 - 32 mmol/L 27  26  26    Calcium 8.9 - 10.3 mg/dL 9.1  9.4  9.3   Total Protein 6.5 - 8.1 g/dL 6.8  6.7  6.5   Total Bilirubin 0.3 - 1.2 mg/dL 0.3  0.3  0.4   Alkaline Phos 38 - 126 U/L 49  48  47    AST 15 - 41 U/L 22  18  18    ALT 0 - 44 U/L 9  8  8        RADIOGRAPHIC STUDIES: I have personally reviewed the radiological images as listed and agreed with the findings in the report. No results found.    Orders Placed This Encounter  Procedures   DG Bone Density    Standing Status:   Future    Order Specific Question:   Reason for Exam (SYMPTOM  OR DIAGNOSIS REQUIRED)    Answer:   routine screening    Order Specific Question:   Preferred imaging location?    Answer:   [facility]    Order Specific Question:   Release to patient    Answer:   Immediate    Order Specific Question:   Is patient pregnant?    Answer:   No   All questions were answered. The patient knows to call the clinic with any problems, questions or concerns. No barriers to learning was detected. The total time spent in the appointment was 25 minutes.     Malachy Mood, MD 10/13/2022   Carolin Coy, CMA, am acting as scribe for Malachy Mood, MD.   I have reviewed the above documentation for accuracy and completeness, and I agree with the above.

## 2022-10-14 ENCOUNTER — Telehealth: Payer: Self-pay | Admitting: Hematology

## 2022-10-16 ENCOUNTER — Other Ambulatory Visit: Payer: Self-pay

## 2022-10-25 ENCOUNTER — Other Ambulatory Visit: Payer: Self-pay

## 2022-10-28 ENCOUNTER — Ambulatory Visit: Payer: Managed Care, Other (non HMO) | Attending: Surgery | Admitting: Rehabilitation

## 2022-10-28 DIAGNOSIS — C50411 Malignant neoplasm of upper-outer quadrant of right female breast: Secondary | ICD-10-CM | POA: Insufficient documentation

## 2022-10-28 DIAGNOSIS — Z483 Aftercare following surgery for neoplasm: Secondary | ICD-10-CM | POA: Insufficient documentation

## 2022-10-28 DIAGNOSIS — Z170 Estrogen receptor positive status [ER+]: Secondary | ICD-10-CM | POA: Insufficient documentation

## 2022-10-28 NOTE — Therapy (Signed)
  OUTPATIENT PHYSICAL THERAPY SOZO SCREENING NOTE   Patient Name: Dana Kramer MRN: [id number] DOB:[birth year], 57 y.o., female Today's Date: 10/28/2022  PCP: Pearline Cables, MD REFERRING PROVIDER: Harriette Bouillon, MD   PT End of Session - 10/28/22 1208     Visit Number 11   screen   PT Start Time 1200    PT Stop Time 1206    PT Time Calculation (min) 6 min    Activity Tolerance Patient tolerated treatment well    Behavior During Therapy [facility] for tasks assessed/performed             Past Medical History:  Diagnosis Date   Allergy 11/1998   Penicillin   Chicken pox    History of transfusion of whole blood    Hyperlipidemia    Malignant neoplasm of upper-outer quadrant of right breast in female, estrogen receptor positive (HCC) 01/13/2021   Migraine    Thyroid disease    Hypothyroidism   Past Surgical History:  Procedure Laterality Date   ABDOMINOPLASTY  10/01/2010   CESAREAN SECTION     twice   HERNIA REPAIR     PORTACATH PLACEMENT Right 01/23/2021   Procedure: Right internal jugular 8 [place] PowerPort with C arm and ultrasound guidance;  Surgeon: Harriette Bouillon, MD;  Location: [facility];  Service: General;  Laterality: Right;   SENTINEL NODE BIOPSY Right 01/23/2021   Procedure: Right axillary sentinel lymph node mapping using Mag Trace;  Surgeon: Harriette Bouillon, MD;  Location: [facility];  Service: General;  Laterality: Right;   SIMPLE MASTECTOMY WITH AXILLARY SENTINEL NODE BIOPSY Bilateral 01/23/2021   Procedure: Right simple mastectomy ;  Left risk reducing simple mastectomy;  Surgeon: Harriette Bouillon, MD;  Location: [facility] OR;  Service: General;  Laterality: Bilateral;   TUBAL LIGATION     umblica hernia repair  10/01/2010   Patient Active Problem List   Diagnosis Date Noted   Port-A-Cath in place 03/04/2021   Genetic testing 01/27/2021   S/P mastectomy, bilateral 01/23/2021   Family history of colon cancer 01/15/2021   Malignant neoplasm of upper-outer quadrant of  right breast in female, estrogen receptor positive (HCC) 01/13/2021   Pre-diabetes 11/06/2020   Acquired hypothyroidism 04/14/2018   Migraine 01/18/2015   Insomnia 11/05/2011   Anxiety 11/05/2011   Hyperlipidemia 11/05/2011   Tachycardia 11/05/2011   Umbilical hernia 11/19/2010    REFERRING DIAG: right breast cancer at risk for lymphedema  THERAPY DIAG:  Aftercare following surgery for neoplasm  Malignant neoplasm of upper-outer quadrant of right breast in female, estrogen receptor positive (HCC)  PRECAUTIONS: right UE Lymphedema risk,   SUBJECTIVE: Doing well lately  PAIN:  Are you having pain? No  SOZO SCREENING: Patient was assessed today using the SOZO machine to determine the lymphedema index score. This was compared to her baseline score. It was determined that she is within the recommended range when compared to her baseline and no further action is needed at this time. She will continue SOZO screenings. These are done every 3 months for 2 years post operatively followed by every 6 months for 2 years, and then annually.   Idamae Lusher, PT 10/28/2022, 12:09 PM

## 2022-10-30 ENCOUNTER — Other Ambulatory Visit: Payer: Self-pay | Admitting: Hematology

## 2022-11-02 ENCOUNTER — Other Ambulatory Visit: Payer: Self-pay | Admitting: Family Medicine

## 2022-11-02 DIAGNOSIS — I10 Essential (primary) hypertension: Secondary | ICD-10-CM

## 2022-11-18 ENCOUNTER — Encounter: Payer: Self-pay | Admitting: Family Medicine

## 2022-11-18 ENCOUNTER — Telehealth: Payer: Self-pay | Admitting: Family Medicine

## 2022-11-18 DIAGNOSIS — E039 Hypothyroidism, unspecified: Secondary | ICD-10-CM

## 2022-11-18 NOTE — Telephone Encounter (Signed)
Pt called requesting to recheck her TSH as she stated Dr. Patsy Lager likes to check it every 6 months. Please Advise.

## 2022-11-18 NOTE — Addendum Note (Signed)
Addended by: Abbe Amsterdam C on: 11/18/2022 09:30 PM   Modules accepted: Orders

## 2022-11-20 ENCOUNTER — Other Ambulatory Visit: Payer: Self-pay

## 2022-11-26 ENCOUNTER — Other Ambulatory Visit: Payer: Managed Care, Other (non HMO)

## 2022-11-26 ENCOUNTER — Ambulatory Visit: Payer: Managed Care, Other (non HMO) | Admitting: Hematology

## 2022-12-01 ENCOUNTER — Other Ambulatory Visit: Payer: Managed Care, Other (non HMO)

## 2022-12-03 ENCOUNTER — Other Ambulatory Visit (INDEPENDENT_AMBULATORY_CARE_PROVIDER_SITE_OTHER): Payer: Managed Care, Other (non HMO)

## 2022-12-03 DIAGNOSIS — E039 Hypothyroidism, unspecified: Secondary | ICD-10-CM

## 2022-12-03 LAB — TSH: TSH: 3.23 u[IU]/mL (ref 0.35–5.50)

## 2022-12-04 ENCOUNTER — Encounter: Payer: Self-pay | Admitting: Family Medicine

## 2022-12-04 NOTE — Patient Instructions (Signed)
Great to see you again today- assuming all is well please see me in about 6 months  Let's plan to do labs, A1c in March Perhaps keep an occasional eye on your blood sugar to make sure no surprises  I ordered your bone density here at the [facility]

## 2022-12-04 NOTE — Progress Notes (Unsigned)
[facility] [address] [place], [place] [address] [phone]  Date:  12/07/2022   Name:  Dana Kramer   DOB:  03/26/1965   MRN:  [id number]  PCP:  Pearline Cables, MD    Chief Complaint: No chief complaint on file.   History of Present Illness:  Dana Kramer is a 57 y.o. very pleasant female patient who presents with the following:  Pt seen today for CPE Last visit with myself was in March   History of hypothyroidism, hyperlipidemia, anxiety and insomnia, mild hypertension She was also diagnosed with breast cancer 2022, had a bilateral mastectomy in November 2022, has completed infusion chemotherapy, she will continue oral chemotherapy with tamoxifen for several years.  Pt seen by oncology in March: tage IA, c(T1bN0M0), Triple positive, Grade 2 -found on screening mammogram. 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 -S/p b/l mastectomies on 01/23/21 with Dr. Luisa Hart. 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 Port was placed during surgery -Baseline EF 60-65% with impaired relaxation (grade I diastolic dysfunction). Will monitor q3 months on herceptin -s/p weekly Abraxane/Herceptin x12 02/25/21 - 05/12/21, -she began maintenance herceptin injection on 06/20/21. She is tolerating well. -she began tamoxifen on 06/20/21. She is tolerating well overall. She notes she has not had a period since completing chemo. We will check her hormone levels in 1-2 years and we can consider switching her to AI when she became postmenopausal. -repeat echo 10/21/21 showed slightly lower EF at 50-55%. She was seen by cardiologist and started on entresto and trastuzumab was held for a few months and restarted after improved EF on follow-ups echo. -She completed adjuvant trastuzumab on April 08, 2022 -Had repeated echo yesterday showed EF 55 to 60%, improved   Also seen by Cardiology in  March: ASSESSMENT & PLAN: 1. Breast Cancer: Started herceptin 02/2021. Echo in 8/23 with EF down to 50-55%.  Herceptin was held and she was started on Entresto + Toprol XL.  Echo in 10/23 showed improvement in EF to 60%.  Echo in 12/23 showed EF 55% with mildly less negative strain at -19.9%.  Echo was done today, EF normal at 55-60%, strain again mildly less negative at -16.4%.  However, she is now off Herceptin. NYHA class I, no complaints.  - She does not need any further screening echoes now that she is off Herceptin.  - Continue current Toprol XL and Entresto. These meds help control her BP.  2. HTN: BP under better control since starting Entresto.   Flu shot Covid booster  Some labs done in August- CMP, CBC  We just checked her thyroid     Patient Active Problem List   Diagnosis Date Noted   Port-A-Cath in place 03/04/2021   Genetic testing 01/27/2021   S/P mastectomy, bilateral 01/23/2021   Family history of colon cancer 01/15/2021   Malignant neoplasm of upper-outer quadrant of right breast in female, estrogen receptor positive (HCC) 01/13/2021   Pre-diabetes 11/06/2020   Acquired hypothyroidism 04/14/2018   Migraine 01/18/2015   Insomnia 11/05/2011   Anxiety 11/05/2011   Hyperlipidemia 11/05/2011   Tachycardia 11/05/2011   Umbilical hernia 11/19/2010    Past Medical History:  Diagnosis Date   Allergy 11/1998   Penicillin   Chicken pox    History of transfusion of whole blood    Hyperlipidemia    Malignant neoplasm of upper-outer quadrant of right breast in  female, estrogen receptor positive (HCC) 01/13/2021   Migraine    Thyroid disease    Hypothyroidism    Past Surgical History:  Procedure Laterality Date   ABDOMINOPLASTY  10/01/2010   CESAREAN SECTION     twice   HERNIA REPAIR     PORTACATH PLACEMENT Right 01/23/2021   Procedure: Right internal jugular 8 [place] PowerPort with C arm and ultrasound guidance;  Surgeon: Harriette Bouillon, MD;  Location: [facility] OR;   Service: General;  Laterality: Right;   SENTINEL NODE BIOPSY Right 01/23/2021   Procedure: Right axillary sentinel lymph node mapping using Mag Trace;  Surgeon: Harriette Bouillon, MD;  Location: [facility];  Service: General;  Laterality: Right;   SIMPLE MASTECTOMY WITH AXILLARY SENTINEL NODE BIOPSY Bilateral 01/23/2021   Procedure: Right simple mastectomy ;  Left risk reducing simple mastectomy;  Surgeon: Harriette Bouillon, MD;  Location: [facility] OR;  Service: General;  Laterality: Bilateral;   TUBAL LIGATION     umblica hernia repair  10/01/2010    Social History   Tobacco Use   Smoking status: Never   Smokeless tobacco: Never  Vaping Use   Vaping status: Never Used  Substance Use Topics   Alcohol use: Never   Drug use: Never    Family History  Problem Relation Age of Onset   Hypertension Mother    Cancer Father 22       esophageal cancer   Leukemia Maternal Grandfather        dx. >50   Colon cancer Paternal Grandfather        dx. >50   Lung cancer Cousin        dx. 44s, did not smoke    Allergies  Allergen Reactions   Betadine [Povidone Iodine] Rash   Penicillins Rash    Mainly upper body only.    Medication list has been reviewed and updated.  Current Outpatient Medications on File Prior to Visit  Medication Sig Dispense Refill   amLODipine (NORVASC) 5 MG tablet Take 1 tablet (5 mg total) by mouth daily. 90 tablet 0   b complex vitamins capsule Take 1 capsule by mouth daily.     Calcium Citrate-Vitamin D (CALCIUM + D PO) Take 1 tablet by mouth daily.     Cholecalciferol (VITAMIN D PO) Take 1 tablet by mouth daily.     levothyroxine (SYNTHROID) 25 MCG tablet TAKE 1 TABLET(25 MCG) BY MOUTH DAILY BEFORE BREAKFAST 90 tablet 3   meclizine (ANTIVERT) 25 MG tablet Take 1 tablet (25 mg total) by mouth 3 (three) times daily as needed for dizziness. 30 tablet 0   Melatonin 10 MG CAPS Take 10 mg by mouth at bedtime.     metoprolol succinate (TOPROL-XL) 50 MG 24 hr tablet TAKE 1  TABLET(50 MG) BY MOUTH DAILY WITH OR IMMEDIATELY FOLLOWING A MEAL 90 tablet 3   Multiple Vitamins-Minerals (WOMENS MULTIVITAMIN PO) Take 1 tablet by mouth daily.     sacubitril-valsartan (ENTRESTO) 24-26 MG Take 1 tablet by mouth 2 (two) times daily. 180 tablet 3   simvastatin (ZOCOR) 20 MG tablet TAKE 1 TABLET(20 MG) BY MOUTH AT BEDTIME 90 tablet 3   tamoxifen (NOLVADEX) 20 MG tablet TAKE 1 TABLET(20 MG) BY MOUTH DAILY 90 tablet 1   traZODone (DESYREL) 50 MG tablet TAKE 1/2 TO 1 TABLET(25 TO 50 MG) BY MOUTH AT BEDTIME AS NEEDED FOR SLEEP 90 tablet 3   No current facility-administered medications on file prior to visit.    Review of Systems:  As  per HPI- otherwise negative.   Physical Examination: There were no vitals filed for this visit. There were no vitals filed for this visit. There is no height or weight on file to calculate BMI. Ideal Body Weight:    GEN: no acute distress. HEENT: Atraumatic, Normocephalic.  Ears and Nose: No external deformity. CV: RRR, No M/G/R. No JVD. No thrill. No extra heart sounds. PULM: CTA B, no wheezes, crackles, rhonchi. No retractions. No resp. distress. No accessory muscle use. ABD: S, NT, ND, +BS. No rebound. No HSM. EXTR: No c/c/e PSYCH: Normally interactive. Conversant.    Assessment and Plan: *** Physical exam- encouraged healthy diet and exercise routine Will plan further follow- up pending labs.  Signed Abbe Amsterdam, MD

## 2022-12-07 ENCOUNTER — Ambulatory Visit (INDEPENDENT_AMBULATORY_CARE_PROVIDER_SITE_OTHER): Payer: Managed Care, Other (non HMO) | Admitting: Family Medicine

## 2022-12-07 VITALS — BP 118/80 | HR 72 | Temp 98.3°F | Resp 18 | Ht 60.0 in | Wt 113.0 lb

## 2022-12-07 DIAGNOSIS — E039 Hypothyroidism, unspecified: Secondary | ICD-10-CM

## 2022-12-07 DIAGNOSIS — Z124 Encounter for screening for malignant neoplasm of cervix: Secondary | ICD-10-CM

## 2022-12-07 DIAGNOSIS — Z0000 Encounter for general adult medical examination without abnormal findings: Secondary | ICD-10-CM | POA: Diagnosis not present

## 2022-12-07 DIAGNOSIS — I10 Essential (primary) hypertension: Secondary | ICD-10-CM

## 2022-12-07 DIAGNOSIS — R7303 Prediabetes: Secondary | ICD-10-CM | POA: Diagnosis not present

## 2022-12-07 DIAGNOSIS — E2839 Other primary ovarian failure: Secondary | ICD-10-CM

## 2022-12-07 DIAGNOSIS — E782 Mixed hyperlipidemia: Secondary | ICD-10-CM

## 2022-12-08 ENCOUNTER — Other Ambulatory Visit: Payer: Self-pay

## 2022-12-10 ENCOUNTER — Encounter: Payer: Self-pay | Admitting: Family Medicine

## 2022-12-10 ENCOUNTER — Ambulatory Visit (HOSPITAL_BASED_OUTPATIENT_CLINIC_OR_DEPARTMENT_OTHER)
Admission: RE | Admit: 2022-12-10 | Discharge: 2022-12-10 | Disposition: A | Discharge status: Home / Self Care | Payer: Managed Care, Other (non HMO) | Source: Ambulatory Visit | Attending: Family Medicine | Admitting: Family Medicine

## 2022-12-10 DIAGNOSIS — E2839 Other primary ovarian failure: Secondary | ICD-10-CM | POA: Diagnosis present

## 2022-12-10 DIAGNOSIS — M818 Other osteoporosis without current pathological fracture: Secondary | ICD-10-CM

## 2022-12-11 MED ORDER — ALENDRONATE SODIUM 70 MG PO TABS
70.0000 mg | ORAL_TABLET | ORAL | 11 refills | Status: DC
Start: 2022-12-11 — End: 2023-01-19

## 2022-12-13 ENCOUNTER — Encounter: Payer: Self-pay | Admitting: Family Medicine

## 2022-12-14 ENCOUNTER — Other Ambulatory Visit: Payer: Self-pay | Admitting: Hematology

## 2022-12-14 ENCOUNTER — Encounter: Payer: Self-pay | Admitting: Hematology

## 2022-12-14 LAB — CYTOLOGY - PAP
Comment: NEGATIVE
Diagnosis: NEGATIVE
High risk HPV: NEGATIVE

## 2022-12-15 ENCOUNTER — Encounter: Payer: Self-pay | Admitting: Family Medicine

## 2022-12-15 DIAGNOSIS — R87619 Unspecified abnormal cytological findings in specimens from cervix uteri: Secondary | ICD-10-CM

## 2022-12-16 ENCOUNTER — Ambulatory Visit (HOSPITAL_BASED_OUTPATIENT_CLINIC_OR_DEPARTMENT_OTHER)
Admission: RE | Admit: 2022-12-16 | Discharge: 2022-12-16 | Disposition: A | Discharge status: Home / Self Care | Payer: Managed Care, Other (non HMO) | Source: Ambulatory Visit | Attending: Family Medicine | Admitting: Family Medicine

## 2022-12-16 ENCOUNTER — Telehealth: Payer: Self-pay

## 2022-12-16 DIAGNOSIS — R87619 Unspecified abnormal cytological findings in specimens from cervix uteri: Secondary | ICD-10-CM | POA: Diagnosis present

## 2022-12-16 NOTE — Addendum Note (Signed)
Addended by: Abbe Amsterdam C on: 12/16/2022 12:07 PM   Modules accepted: Orders

## 2022-12-16 NOTE — Telephone Encounter (Signed)
Faxed a dental clearance to Dr. Mechele Claude to begin Zometa. Received fax confirmation of receipt.

## 2022-12-21 ENCOUNTER — Other Ambulatory Visit: Payer: Self-pay

## 2022-12-21 NOTE — Progress Notes (Signed)
Received Dental clearance form stating she needs to wait two months before starting Zometa due to dental work that needs to be done. Showed form to Dr. Mosetta Putt then placed form in the to be scanned file.

## 2022-12-22 ENCOUNTER — Encounter: Payer: Self-pay | Admitting: Family Medicine

## 2022-12-22 DIAGNOSIS — C649 Malignant neoplasm of unspecified kidney, except renal pelvis: Secondary | ICD-10-CM

## 2022-12-22 DIAGNOSIS — N888 Other specified noninflammatory disorders of cervix uteri: Secondary | ICD-10-CM

## 2022-12-23 NOTE — Addendum Note (Signed)
Addended by: Abbe Amsterdam C on: 12/23/2022 08:43 PM   Modules accepted: Orders

## 2022-12-24 NOTE — Addendum Note (Signed)
Addended by: Abbe Amsterdam C on: 12/24/2022 12:41 PM   Modules accepted: Orders

## 2022-12-29 ENCOUNTER — Other Ambulatory Visit: Payer: Self-pay | Admitting: Obstetrics and Gynecology

## 2022-12-29 DIAGNOSIS — R1909 Other intra-abdominal and pelvic swelling, mass and lump: Secondary | ICD-10-CM

## 2023-01-04 ENCOUNTER — Ambulatory Visit
Admission: RE | Admit: 2023-01-04 | Discharge: 2023-01-04 | Disposition: A | Discharge status: Home / Self Care | Payer: Managed Care, Other (non HMO) | Source: Ambulatory Visit | Attending: Obstetrics and Gynecology | Admitting: Obstetrics and Gynecology

## 2023-01-04 ENCOUNTER — Encounter: Payer: Self-pay | Admitting: Hematology

## 2023-01-04 DIAGNOSIS — R1909 Other intra-abdominal and pelvic swelling, mass and lump: Secondary | ICD-10-CM

## 2023-01-04 MED ORDER — IOPAMIDOL (ISOVUE-300) INJECTION 61%
100.0000 mL | Freq: Once | INTRAVENOUS | Status: AC | PRN
  Administered 2023-01-04: 100 mL via INTRAVENOUS

## 2023-01-04 NOTE — Addendum Note (Signed)
Addended by: Abbe Amsterdam C on: 01/04/2023 06:30 PM   Modules accepted: Orders

## 2023-01-06 ENCOUNTER — Encounter (HOSPITAL_COMMUNITY): Payer: Self-pay | Admitting: Urology

## 2023-01-06 ENCOUNTER — Other Ambulatory Visit: Payer: Self-pay

## 2023-01-06 ENCOUNTER — Other Ambulatory Visit: Payer: Self-pay | Admitting: Urology

## 2023-01-06 ENCOUNTER — Telehealth: Payer: Self-pay | Admitting: Hematology

## 2023-01-06 NOTE — Anesthesia Preprocedure Evaluation (Signed)
Anesthesia Evaluation  Patient identified by MRN, date of birth, ID band Patient awake    Reviewed: Allergy & Precautions, NPO status , Patient's Chart, lab work & pertinent test results  Airway Mallampati: II  TM Distance: >3 FB Neck ROM: Full    Dental no notable dental hx.    Pulmonary neg pulmonary ROS   Pulmonary exam normal        Cardiovascular hypertension, Pt. on medications and Pt. on home beta blockers Normal cardiovascular exam     Neuro/Psych  Headaches  Anxiety        GI/Hepatic negative GI ROS, Neg liver ROS,,,  Endo/Other  Hypothyroidism    Renal/GU negative Renal ROS     Musculoskeletal negative musculoskeletal ROS (+)    Abdominal   Peds  Hematology negative hematology ROS (+)   Anesthesia Other Findings LEFT RENAL MASS  Reproductive/Obstetrics                             Anesthesia Physical Anesthesia Plan  ASA: 3  Anesthesia Plan: General   Post-op Pain Management:    Induction: Intravenous  PONV Risk Score and Plan: 4 or greater and Ondansetron, Dexamethasone, Midazolam, Scopolamine patch - Pre-op and Treatment may vary due to age or medical condition  Airway Management Planned: Oral ETT  Additional Equipment:   Intra-op Plan:   Post-operative Plan: Extubation in OR  Informed Consent: I have reviewed the patients History and Physical, chart, labs and discussed the procedure including the risks, benefits and alternatives for the proposed anesthesia with the patient or authorized representative who has indicated his/her understanding and acceptance.     Dental advisory given  Plan Discussed with: CRNA  Anesthesia Plan Comments:        Anesthesia Quick Evaluation

## 2023-01-06 NOTE — Progress Notes (Signed)
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  Chest x-ray - N/A EKG - 05-27-22 Epic Stress Test - N/A ECHO - 05-27-22 Epic Cardiac Cath - N/A Pacemaker/ICD device last checked: Spinal Cord Stimulator: N/A  Bowel Prep - N/A  Sleep Study - N/A CPAP -   Prediabetes Fasting Blood Sugar -  Checks Blood Sugar - does not check   Last dose of GLP1 agonist-  N/A GLP1 instructions:  N/A   Last dose of SGLT-2 inhibitors-  N/A SGLT-2 instructions: N/A  Blood Thinner Instructions:  Time Aspirin Instructions: Last Dose:  Activity level:  Can go up a flight of stairs and perform activities of daily living without stopping and without symptoms of chest pain or shortness of breath.  Able to exercise without symptoms, patient states that she exercises regularly with no issues.  Anesthesia review:   Patient denies shortness of breath, fever, cough and chest pain at PAT appointment (completed over the phone)  Patient verbalized understanding of instructions that were given to them at the PAT appointment. Patient was also instructed that they will need to review over the PAT instructions again at home before surgery.

## 2023-01-07 ENCOUNTER — Encounter (HOSPITAL_COMMUNITY): Payer: Self-pay | Admitting: Urology

## 2023-01-07 ENCOUNTER — Other Ambulatory Visit: Payer: Self-pay

## 2023-01-07 ENCOUNTER — Inpatient Hospital Stay (HOSPITAL_COMMUNITY)
Admission: RE | Admit: 2023-01-07 | Discharge: 2023-01-08 | DRG: 658 | Disposition: A | Discharge status: Home / Self Care | Payer: Managed Care, Other (non HMO) | Attending: Urology | Admitting: Urology

## 2023-01-07 ENCOUNTER — Inpatient Hospital Stay (HOSPITAL_COMMUNITY): Payer: Managed Care, Other (non HMO) | Admitting: Anesthesiology

## 2023-01-07 ENCOUNTER — Encounter (HOSPITAL_COMMUNITY)
Admission: RE | Disposition: A | Discharge status: Home / Self Care | Payer: Self-pay | Source: Home / Self Care | Attending: Urology

## 2023-01-07 DIAGNOSIS — Z79890 Hormone replacement therapy (postmenopausal): Secondary | ICD-10-CM

## 2023-01-07 DIAGNOSIS — Z888 Allergy status to other drugs, medicaments and biological substances status: Secondary | ICD-10-CM

## 2023-01-07 DIAGNOSIS — Z79899 Other long term (current) drug therapy: Secondary | ICD-10-CM | POA: Diagnosis not present

## 2023-01-07 DIAGNOSIS — R7303 Prediabetes: Secondary | ICD-10-CM | POA: Diagnosis present

## 2023-01-07 DIAGNOSIS — E7800 Pure hypercholesterolemia, unspecified: Secondary | ICD-10-CM | POA: Diagnosis present

## 2023-01-07 DIAGNOSIS — F419 Anxiety disorder, unspecified: Secondary | ICD-10-CM | POA: Diagnosis present

## 2023-01-07 DIAGNOSIS — D3502 Benign neoplasm of left adrenal gland: Secondary | ICD-10-CM | POA: Diagnosis present

## 2023-01-07 DIAGNOSIS — Z170 Estrogen receptor positive status [ER+]: Secondary | ICD-10-CM

## 2023-01-07 DIAGNOSIS — N2889 Other specified disorders of kidney and ureter: Secondary | ICD-10-CM

## 2023-01-07 DIAGNOSIS — Z9013 Acquired absence of bilateral breasts and nipples: Secondary | ICD-10-CM | POA: Diagnosis not present

## 2023-01-07 DIAGNOSIS — Z8249 Family history of ischemic heart disease and other diseases of the circulatory system: Secondary | ICD-10-CM | POA: Diagnosis not present

## 2023-01-07 DIAGNOSIS — C642 Malignant neoplasm of left kidney, except renal pelvis: Principal | ICD-10-CM | POA: Diagnosis present

## 2023-01-07 DIAGNOSIS — Z880 Allergy status to penicillin: Secondary | ICD-10-CM

## 2023-01-07 DIAGNOSIS — Z79810 Long term (current) use of selective estrogen receptor modulators (SERMs): Secondary | ICD-10-CM

## 2023-01-07 DIAGNOSIS — C50411 Malignant neoplasm of upper-outer quadrant of right female breast: Secondary | ICD-10-CM | POA: Diagnosis present

## 2023-01-07 DIAGNOSIS — Z7983 Long term (current) use of bisphosphonates: Secondary | ICD-10-CM | POA: Diagnosis not present

## 2023-01-07 DIAGNOSIS — D649 Anemia, unspecified: Secondary | ICD-10-CM

## 2023-01-07 DIAGNOSIS — Z800 Family history of malignant neoplasm of digestive organs: Secondary | ICD-10-CM

## 2023-01-07 DIAGNOSIS — Z01818 Encounter for other preprocedural examination: Secondary | ICD-10-CM

## 2023-01-07 DIAGNOSIS — E039 Hypothyroidism, unspecified: Secondary | ICD-10-CM | POA: Diagnosis present

## 2023-01-07 DIAGNOSIS — I10 Essential (primary) hypertension: Principal | ICD-10-CM | POA: Diagnosis present

## 2023-01-07 HISTORY — DX: Essential (primary) hypertension: I10

## 2023-01-07 HISTORY — PX: ROBOT ASSISTED LAPAROSCOPIC NEPHRECTOMY: SHX5140

## 2023-01-07 HISTORY — DX: Hypothyroidism, unspecified: E03.9

## 2023-01-07 HISTORY — DX: Anemia, unspecified: D64.9

## 2023-01-07 HISTORY — DX: Prediabetes: R73.03

## 2023-01-07 LAB — CBC
HCT: 36.8 % (ref 36.0–46.0)
HCT: 31.8 % — ABNORMAL LOW (ref 36.0–46.0)
Hemoglobin: 12.1 g/dL (ref 12.0–15.0)
Hemoglobin: 10.3 g/dL — ABNORMAL LOW (ref 12.0–15.0)
MCH: 29.2 pg (ref 26.0–34.0)
MCH: 29 pg (ref 26.0–34.0)
MCHC: 32.9 g/dL (ref 30.0–36.0)
MCHC: 32.4 g/dL (ref 30.0–36.0)
MCV: 88.7 fL (ref 80.0–100.0)
MCV: 89.6 fL (ref 80.0–100.0)
Platelets: 201 10*3/uL (ref 150–400)
Platelets: 166 10*3/uL (ref 150–400)
RBC: 4.15 MIL/uL (ref 3.87–5.11)
RBC: 3.55 MIL/uL — ABNORMAL LOW (ref 3.87–5.11)
RDW: 12.1 % (ref 11.5–15.5)
RDW: 12 % (ref 11.5–15.5)
WBC: 4.9 10*3/uL (ref 4.0–10.5)
WBC: 10.5 10*3/uL (ref 4.0–10.5)
nRBC: 0 % (ref 0.0–0.2)
nRBC: 0 % (ref 0.0–0.2)

## 2023-01-07 LAB — TYPE AND SCREEN
ABO/RH(D): A POS
Antibody Screen: NEGATIVE

## 2023-01-07 LAB — COMPREHENSIVE METABOLIC PANEL WITH GFR
ALT: 12 U/L (ref 0–44)
AST: 24 U/L (ref 15–41)
Albumin: 3.5 g/dL (ref 3.5–5.0)
Alkaline Phosphatase: 39 U/L (ref 38–126)
Anion gap: 7 (ref 5–15)
BUN: 16 mg/dL (ref 6–20)
CO2: 24 mmol/L (ref 22–32)
Calcium: 8.5 mg/dL — ABNORMAL LOW (ref 8.9–10.3)
Chloride: 108 mmol/L (ref 98–111)
Creatinine, Ser: 0.93 mg/dL (ref 0.44–1.00)
GFR, Estimated: >60 mL/min (ref >60)
Glucose, Bld: 147 mg/dL — ABNORMAL HIGH (ref 70–99)
Potassium: 3.7 mmol/L (ref 3.5–5.1)
Sodium: 139 mmol/L (ref 135–145)
Total Bilirubin: 0.4 mg/dL (ref 0.3–1.2)
Total Protein: 6 g/dL — ABNORMAL LOW (ref 6.5–8.1)

## 2023-01-07 LAB — BASIC METABOLIC PANEL
Anion gap: 9 (ref 5–15)
BUN: 14 mg/dL (ref 6–20)
CO2: 21 mmol/L — ABNORMAL LOW (ref 22–32)
Calcium: 9.1 mg/dL (ref 8.9–10.3)
Chloride: 108 mmol/L (ref 98–111)
Creatinine, Ser: 0.71 mg/dL (ref 0.44–1.00)
GFR, Estimated: >60 mL/min (ref >60)
Glucose, Bld: 96 mg/dL (ref 70–99)
Potassium: 3.6 mmol/L (ref 3.5–5.1)
Sodium: 138 mmol/L (ref 135–145)

## 2023-01-07 LAB — ABO/RH: ABO/RH(D): A POS

## 2023-01-07 SURGERY — NEPHRECTOMY, RADICAL, ROBOT-ASSISTED, LAPAROSCOPIC, ADULT
Anesthesia: General | Laterality: Left

## 2023-01-07 MED ORDER — CHLORHEXIDINE GLUCONATE 0.12 % MT SOLN
15.0000 mL | Freq: Once | OROMUCOSAL | Status: AC
Start: 2023-01-07 — End: 2023-01-07
  Administered 2023-01-07: 15 mL via OROMUCOSAL

## 2023-01-07 MED ORDER — ONDANSETRON HCL 4 MG/2ML IJ SOLN
INTRAMUSCULAR | Status: DC | PRN
  Administered 2023-01-07: 4 mg via INTRAVENOUS

## 2023-01-07 MED ORDER — POLYETHYLENE GLYCOL 3350 17 GM/SCOOP PO POWD: 1.0000 | Freq: Once | ORAL | Status: DC

## 2023-01-07 MED ORDER — HYDROMORPHONE HCL 1 MG/ML IJ SOLN: 0.5000 mg | INTRAMUSCULAR | Status: DC | PRN

## 2023-01-07 MED ORDER — PROPOFOL 10 MG/ML IV BOLUS
INTRAVENOUS | Status: AC
  Filled 2023-01-07: qty 20

## 2023-01-07 MED ORDER — AMISULPRIDE (ANTIEMETIC) 5 MG/2ML IV SOLN
INTRAVENOUS | Status: AC
  Filled 2023-01-07: qty 2

## 2023-01-07 MED ORDER — SCOPOLAMINE 1 MG/3DAYS TD PT72
MEDICATED_PATCH | TRANSDERMAL | Status: DC | PRN
  Administered 2023-01-07: 1 via TRANSDERMAL

## 2023-01-07 MED ORDER — BUPIVACAINE-EPINEPHRINE (PF) 0.5% -1:200000 IJ SOLN
INTRAMUSCULAR | Status: AC
  Filled 2023-01-07: qty 30

## 2023-01-07 MED ORDER — SODIUM CHLORIDE (PF) 0.9 % IJ SOLN
INTRAMUSCULAR | Status: AC
  Filled 2023-01-07: qty 10

## 2023-01-07 MED ORDER — OXYCODONE HCL 5 MG/5ML PO SOLN: 5.0000 mg | Freq: Once | ORAL | Status: AC | PRN

## 2023-01-07 MED ORDER — TRAZODONE HCL 50 MG PO TABS: 50.0000 mg | ORAL_TABLET | Freq: Every evening | ORAL | Status: DC | PRN

## 2023-01-07 MED ORDER — OXYCODONE HCL 5 MG PO TABS
5.0000 mg | ORAL_TABLET | Freq: Once | ORAL | Status: AC | PRN
  Administered 2023-01-07: 5 mg via ORAL

## 2023-01-07 MED ORDER — ROCURONIUM BROMIDE 10 MG/ML (PF) SYRINGE
PREFILLED_SYRINGE | INTRAVENOUS | Status: AC
  Filled 2023-01-07: qty 10

## 2023-01-07 MED ORDER — ROCURONIUM BROMIDE 10 MG/ML (PF) SYRINGE
PREFILLED_SYRINGE | INTRAVENOUS | Status: DC | PRN
  Administered 2023-01-07: 60 mg via INTRAVENOUS
  Administered 2023-01-07 (×2): 20 mg via INTRAVENOUS

## 2023-01-07 MED ORDER — OXYCODONE HCL 5 MG PO TABS
ORAL_TABLET | ORAL | Status: AC
  Filled 2023-01-07: qty 1

## 2023-01-07 MED ORDER — SUGAMMADEX SODIUM 200 MG/2ML IV SOLN
INTRAVENOUS | Status: DC | PRN
  Administered 2023-01-07: 120 mg via INTRAVENOUS

## 2023-01-07 MED ORDER — BUPIVACAINE LIPOSOME 1.3 % IJ SUSP
INTRAMUSCULAR | Status: AC
  Filled 2023-01-07: qty 20

## 2023-01-07 MED ORDER — DEXAMETHASONE SODIUM PHOSPHATE 10 MG/ML IJ SOLN
INTRAMUSCULAR | Status: DC | PRN
  Administered 2023-01-07: 8 mg via INTRAVENOUS

## 2023-01-07 MED ORDER — ONDANSETRON HCL 4 MG/2ML IJ SOLN: 4.0000 mg | INTRAMUSCULAR | Status: DC | PRN

## 2023-01-07 MED ORDER — ORAL CARE MOUTH RINSE: 15.0000 mL | Freq: Once | OROMUCOSAL | Status: AC

## 2023-01-07 MED ORDER — BUPIVACAINE LIPOSOME 1.3 % IJ SUSP
INTRAMUSCULAR | Status: DC | PRN
  Administered 2023-01-07: 20 mL

## 2023-01-07 MED ORDER — KETAMINE HCL 50 MG/5ML IJ SOSY
PREFILLED_SYRINGE | INTRAMUSCULAR | Status: AC
  Filled 2023-01-07: qty 5

## 2023-01-07 MED ORDER — LIDOCAINE HCL (PF) 2 % IJ SOLN
INTRAMUSCULAR | Status: AC
  Filled 2023-01-07: qty 5

## 2023-01-07 MED ORDER — FENTANYL CITRATE PF 50 MCG/ML IJ SOSY
PREFILLED_SYRINGE | INTRAMUSCULAR | Status: AC
  Filled 2023-01-07: qty 2

## 2023-01-07 MED ORDER — FENTANYL CITRATE (PF) 100 MCG/2ML IJ SOLN
INTRAMUSCULAR | Status: AC
  Filled 2023-01-07: qty 2

## 2023-01-07 MED ORDER — AMISULPRIDE (ANTIEMETIC) 5 MG/2ML IV SOLN
10.0000 mg | Freq: Once | INTRAVENOUS | Status: AC | PRN
  Administered 2023-01-07: 10 mg via INTRAVENOUS

## 2023-01-07 MED ORDER — METOPROLOL SUCCINATE ER 50 MG PO TB24
50.0000 mg | ORAL_TABLET | Freq: Every day | ORAL | Status: DC
  Administered 2023-01-08: 50 mg via ORAL
  Filled 2023-01-07: qty 1

## 2023-01-07 MED ORDER — SODIUM CHLORIDE (PF) 0.9 % IJ SOLN
INTRAMUSCULAR | Status: AC
  Filled 2023-01-07: qty 20

## 2023-01-07 MED ORDER — PHENYLEPHRINE HCL-NACL 20-0.9 MG/250ML-% IV SOLN
INTRAVENOUS | Status: DC | PRN
  Administered 2023-01-07: 20 ug/min via INTRAVENOUS

## 2023-01-07 MED ORDER — MIDAZOLAM HCL 2 MG/2ML IJ SOLN
INTRAMUSCULAR | Status: AC
  Filled 2023-01-07: qty 2

## 2023-01-07 MED ORDER — CLINDAMYCIN PHOSPHATE 900 MG/50ML IV SOLN
900.0000 mg | Freq: Once | INTRAVENOUS | Status: AC
  Administered 2023-01-07: 900 mg via INTRAVENOUS

## 2023-01-07 MED ORDER — KCL IN DEXTROSE-NACL 20-5-0.45 MEQ/L-%-% IV SOLN
INTRAVENOUS | Status: DC
  Filled 2023-01-07 (×3): qty 1000

## 2023-01-07 MED ORDER — FENTANYL CITRATE PF 50 MCG/ML IJ SOSY
25.0000 ug | PREFILLED_SYRINGE | INTRAMUSCULAR | Status: DC | PRN
Start: 2023-01-07 — End: 2023-01-07
  Administered 2023-01-07: 50 ug via INTRAVENOUS

## 2023-01-07 MED ORDER — DOCUSATE SODIUM 100 MG PO CAPS: 100.0000 mg | ORAL_CAPSULE | Freq: Two times a day (BID) | ORAL | Status: DC

## 2023-01-07 MED ORDER — LIDOCAINE 2% (20 MG/ML) 5 ML SYRINGE
INTRAMUSCULAR | Status: DC | PRN
  Administered 2023-01-07: 60 mg via INTRAVENOUS

## 2023-01-07 MED ORDER — PROPOFOL 10 MG/ML IV BOLUS
INTRAVENOUS | Status: DC | PRN
  Administered 2023-01-07: 100 mg via INTRAVENOUS

## 2023-01-07 MED ORDER — TRAMADOL HCL 50 MG PO TABS
50.0000 mg | ORAL_TABLET | ORAL | Status: DC | PRN
  Administered 2023-01-08: 50 mg via ORAL
  Filled 2023-01-07: qty 1

## 2023-01-07 MED ORDER — SODIUM CHLORIDE (PF) 0.9 % IJ SOLN
INTRAMUSCULAR | Status: DC | PRN
  Administered 2023-01-07: 30 mL

## 2023-01-07 MED ORDER — MIDAZOLAM HCL 2 MG/2ML IJ SOLN
INTRAMUSCULAR | Status: DC | PRN
  Administered 2023-01-07: 2 mg via INTRAVENOUS

## 2023-01-07 MED ORDER — ACETAMINOPHEN 10 MG/ML IV SOLN
1000.0000 mg | Freq: Four times a day (QID) | INTRAVENOUS | Status: DC
  Administered 2023-01-07 – 2023-01-08 (×3): 1000 mg via INTRAVENOUS
  Filled 2023-01-07 (×3): qty 100

## 2023-01-07 MED ORDER — BUPIVACAINE-EPINEPHRINE 0.5% -1:200000 IJ SOLN
INTRAMUSCULAR | Status: DC | PRN
  Administered 2023-01-07: 10 mL
  Administered 2023-01-07: 20 mL

## 2023-01-07 MED ORDER — FENTANYL CITRATE (PF) 100 MCG/2ML IJ SOLN
INTRAMUSCULAR | Status: DC | PRN
  Administered 2023-01-07: 50 ug via INTRAVENOUS
  Administered 2023-01-07: 100 ug via INTRAVENOUS

## 2023-01-07 MED ORDER — KETOROLAC TROMETHAMINE 15 MG/ML IJ SOLN
15.0000 mg | Freq: Four times a day (QID) | INTRAMUSCULAR | Status: DC
  Administered 2023-01-07 – 2023-01-08 (×3): 15 mg via INTRAVENOUS
  Filled 2023-01-07 (×3): qty 1

## 2023-01-07 MED ORDER — LEVOTHYROXINE SODIUM 25 MCG PO TABS
25.0000 ug | ORAL_TABLET | Freq: Every day | ORAL | Status: DC
  Administered 2023-01-08: 25 ug via ORAL
  Filled 2023-01-07: qty 1

## 2023-01-07 MED ORDER — ONDANSETRON HCL 4 MG/2ML IJ SOLN
INTRAMUSCULAR | Status: AC
  Filled 2023-01-07: qty 2

## 2023-01-07 MED ORDER — CLINDAMYCIN PHOSPHATE 900 MG/50ML IV SOLN: 900.0000 mg | INTRAVENOUS | Status: DC

## 2023-01-07 MED ORDER — KETAMINE HCL 10 MG/ML IJ SOLN
INTRAMUSCULAR | Status: DC | PRN
  Administered 2023-01-07: 5 mg via INTRAVENOUS
  Administered 2023-01-07: 20 mg via INTRAVENOUS

## 2023-01-07 MED ORDER — LACTATED RINGERS IV SOLN: INTRAVENOUS | Status: DC | PRN

## 2023-01-07 MED ORDER — HEMOSTATIC AGENTS (NO CHARGE) OPTIME
TOPICAL | Status: DC | PRN
Start: 2023-01-07 — End: 2023-01-07
  Administered 2023-01-07: 1 via TOPICAL

## 2023-01-07 MED ORDER — DOCUSATE SODIUM 100 MG PO CAPS
100.0000 mg | ORAL_CAPSULE | Freq: Two times a day (BID) | ORAL | Status: DC
  Administered 2023-01-07 – 2023-01-08 (×3): 100 mg via ORAL
  Filled 2023-01-07 (×3): qty 1

## 2023-01-07 MED ORDER — CLINDAMYCIN PHOSPHATE 900 MG/50ML IV SOLN
INTRAVENOUS | Status: AC
  Filled 2023-01-07: qty 50

## 2023-01-07 MED ORDER — STERILE WATER FOR IRRIGATION IR SOLN
Status: DC | PRN
Start: 2023-01-07 — End: 2023-01-07
  Administered 2023-01-07: 500 mL

## 2023-01-07 MED ORDER — SCOPOLAMINE 1 MG/3DAYS TD PT72
MEDICATED_PATCH | TRANSDERMAL | Status: AC
  Filled 2023-01-07: qty 1

## 2023-01-07 MED ORDER — ACETAMINOPHEN 500 MG PO TABS
1000.0000 mg | ORAL_TABLET | Freq: Once | ORAL | Status: AC
Start: 2023-01-07 — End: 2023-01-07
  Administered 2023-01-07: 1000 mg via ORAL
  Filled 2023-01-07: qty 2

## 2023-01-07 MED ORDER — AMLODIPINE BESYLATE 10 MG PO TABS
5.0000 mg | ORAL_TABLET | Freq: Every day | ORAL | Status: DC
  Administered 2023-01-08: 5 mg via ORAL
  Filled 2023-01-07: qty 1

## 2023-01-07 MED ORDER — KETOROLAC TROMETHAMINE 30 MG/ML IJ SOLN
INTRAMUSCULAR | Status: DC | PRN
  Administered 2023-01-07: 15 mg via INTRAVENOUS

## 2023-01-07 SURGICAL SUPPLY — 68 items
ADH SKN CLS APL DERMABOND .7 (GAUZE/BANDAGES/DRESSINGS) ×2
AGENT HMST KT MTR STRL THRMB (HEMOSTASIS)
APL ESCP 34 STRL LF DISP (HEMOSTASIS) ×1
APL PRP STRL LF DISP 70% ISPRP (MISCELLANEOUS) ×1
APPLICATOR SURGIFLO ENDO (HEMOSTASIS) ×1 IMPLANT
BAG COUNTER SPONGE SURGICOUNT (BAG) IMPLANT
BAG LAPAROSCOPIC 12 15 PORT 16 (BASKET) ×1 IMPLANT
BAG RETRIEVAL 12/15 (BASKET) ×1
BAG SPNG CNTER NS LX DISP (BAG)
CHLORAPREP W/TINT 26 (MISCELLANEOUS) ×1 IMPLANT
CLIP LIGATING HEM O LOK PURPLE (MISCELLANEOUS) IMPLANT
CLIP LIGATING HEMO LOK XL GOLD (MISCELLANEOUS) IMPLANT
CLIP LIGATING HEMO O LOK GREEN (MISCELLANEOUS) IMPLANT
COVER SURGICAL LIGHT HANDLE (MISCELLANEOUS) ×1 IMPLANT
COVER TIP SHEARS 8 DVNC (MISCELLANEOUS) ×1 IMPLANT
CUTTER ECHEON FLEX ENDO 45 340 (ENDOMECHANICALS) IMPLANT
DERMABOND ADVANCED .7 DNX12 (GAUZE/BANDAGES/DRESSINGS) ×2 IMPLANT
DRAPE ARM DVNC X/XI (DISPOSABLE) ×4 IMPLANT
DRAPE COLUMN DVNC XI (DISPOSABLE) ×1 IMPLANT
DRAPE INCISE 23X17 STRL (DRAPES) IMPLANT
DRAPE INCISE IOBAN 23X17 STRL (DRAPES) ×1 IMPLANT
DRAPE INCISE IOBAN 66X45 STRL (DRAPES) ×1 IMPLANT
DRAPE LAPAROSCOPIC ABDOMINAL (DRAPES) IMPLANT
DRAPE SHEET LG 3/4 BI-LAMINATE (DRAPES) ×1 IMPLANT
DRIVER NDL LRG 8 DVNC XI (INSTRUMENTS) ×2 IMPLANT
DRIVER NDLE LRG 8 DVNC XI (INSTRUMENTS) ×2 IMPLANT
ELECT PENCIL ROCKER SW 15FT (MISCELLANEOUS) IMPLANT
ELECT REM PT RETURN 15FT ADLT (MISCELLANEOUS) ×1 IMPLANT
FORCEPS BPLR LNG DVNC XI (INSTRUMENTS) ×1 IMPLANT
FORCEPS PROGRASP DVNC XI (FORCEP) ×1 IMPLANT
GLOVE BIO SURGEON STRL SZ 6.5 (GLOVE) ×1 IMPLANT
GLOVE SURG LX STRL 7.5 STRW (GLOVE) ×2 IMPLANT
GOWN SRG XL LVL 4 BRTHBL STRL (GOWNS) ×1 IMPLANT
GOWN STRL NON-REIN XL LVL4 (GOWNS) ×1
GOWN STRL REUS W/ TWL LRG LVL3 (GOWN DISPOSABLE) ×2 IMPLANT
GOWN STRL REUS W/TWL LRG LVL3 (GOWN DISPOSABLE) ×2
HEMOSTAT SURGICEL 4X8 (HEMOSTASIS) IMPLANT
HOLDER FOLEY CATH W/STRAP (MISCELLANEOUS) ×1 IMPLANT
IRRIG SUCT STRYKERFLOW 2 WTIP (MISCELLANEOUS) ×1
IRRIGATION SUCT STRKRFLW 2 WTP (MISCELLANEOUS) IMPLANT
KIT BASIN OR (CUSTOM PROCEDURE TRAY) ×1 IMPLANT
KIT TURNOVER KIT A (KITS) IMPLANT
NS IRRIG 1000ML POUR BTL (IV SOLUTION) ×1 IMPLANT
PAD POSITIONING PINK XL (MISCELLANEOUS) ×1 IMPLANT
PROTECTOR NERVE ULNAR (MISCELLANEOUS) ×2 IMPLANT
RELOAD STAPLE 45 2.6 WHT THIN (STAPLE) IMPLANT
SCISSORS MNPLR CVD DVNC XI (INSTRUMENTS) ×1 IMPLANT
SEAL UNIV 5-12 XI (MISCELLANEOUS) ×3 IMPLANT
SET TUBE SMOKE EVAC HIGH FLOW (TUBING) ×1 IMPLANT
SOL ELECTROSURG ANTI STICK (MISCELLANEOUS) ×1
SOLUTION ELECTROSURG ANTI STCK (MISCELLANEOUS) ×1 IMPLANT
SPIKE FLUID TRANSFER (MISCELLANEOUS) ×1 IMPLANT
STAPLE RELOAD 45 WHT (STAPLE) ×2 IMPLANT
STAPLER POWER ECHELON 45 WIDE (STAPLE) IMPLANT
SURGIFLO W/THROMBIN 8M KIT (HEMOSTASIS) IMPLANT
SUT MNCRL AB 4-0 PS2 18 (SUTURE) ×2 IMPLANT
SUT PDS AB 0 CTX 60 (SUTURE) ×1 IMPLANT
SUT VIC AB 0 CT1 27 (SUTURE) ×1
SUT VIC AB 0 CT1 27XBRD ANTBC (SUTURE) ×1 IMPLANT
SUT VIC AB 2-0 SH 27 (SUTURE) ×1
SUT VIC AB 2-0 SH 27X BRD (SUTURE) IMPLANT
SUT VICRYL 0 UR6 27IN ABS (SUTURE) IMPLANT
TOWEL OR 17X26 10 PK STRL BLUE (TOWEL DISPOSABLE) ×2 IMPLANT
TOWEL OR NON WOVEN STRL DISP B (DISPOSABLE) ×1 IMPLANT
TRAY FOLEY MTR SLVR 16FR STAT (SET/KITS/TRAYS/PACK) ×1 IMPLANT
TRAY LAPAROSCOPIC (CUSTOM PROCEDURE TRAY) ×1 IMPLANT
TROCAR ADV FIXATION 12X100MM (TROCAR) IMPLANT
WATER STERILE IRR 1000ML POUR (IV SOLUTION) ×1 IMPLANT

## 2023-01-07 NOTE — Op Note (Signed)
Preoperative diagnosis:  Left renal mass   Postoperative diagnosis:  same   Procedure: Robotic assisted laparoscopic left radical nephrectomy  Surgeon: Crist Fat, MD 1st assistant: Sharion Dove, MD  Anesthesia: General  Complications: None  Intraoperative findings:  #1. Adrenal gland included as part of the specimen #2. Hilum simple, taken separately with individual staple loads  EBL: 30cc  Specimens: left kidney and proximal ureter   Indication: Dana Kramer is a 57 y.o. patient with large left renal mass.  After reviewing the management options for treatment, he elected to proceed with the above surgical procedure(s). We have discussed the potential benefits and risks of the procedure, side effects of the proposed treatment, the likelihood of the patient achieving the goals of the procedure, and any potential problems that might occur during the procedure or recuperation. Informed consent has been obtained.  Description of procedure:  The patient was taken to the operating room and a general anesthetic was administered. The patient was given preoperative antibiotics, placed in the right modified flank position with care to pad all potential pressure points, and prepped and draped in the usual sterile fashion. Next a preoperative timeout was performed.  A site was selected on the left side of the umbilicus for placement of the camera port. This was placed using a standard modified Hassan technique with entry into the peritoneum with a  8 mm tocar. We entered the peritoneum without incident and established pneumoperitoneum.  The camera was then used to inspect the abdomen and there was no evidence of any intra-abdominal injuries or other abnormalities. The remaining abdominal ports were then placed. 8 mm robotic ports were placed in the left upper quadrant, left lower quadrant, and left lateral abdominal wall, making a soft J. A 12 mm port was placed in the upper midline for  laparoscopic assistance. All ports were placed under direct vision without difficulty. The surgical cart was then docked.   Utilizing the cautery scissors, the white line of Toldt was incised allowing the colon to be mobilized medially and the plane between the mesocolon and the anterior layer of Gerota's fascia to be developed and the kidney to be exposed.  The ureter and gonadal vein were identified inferiorly and the ureter was lifted anteriorly off the psoas muscle.  Dissection proceeded superiorly along the gonadal vein until the renal vein was identified.  The renal hilum was then carefully isolated with a combination of blunt and sharp dissection allowing the renal arterial and venous structures to be separated and isolated in preparation for renal hilar vessel ligation.  The renal artery was taken first with a 45 mm vascular stapler, the renal vein followed.  I then slowly worked my way around the upper pole of the kidney and opted to include the adrenal gland with the specimen.  I dissected out the adrenal vein and ligated that with 2 medium size clips.  I then continued the dissection all the way up around the upper pole separating Gerota fascia from the spleen and pancreas.  I then turned our attention to the lower pole and the tail of Gerota's fascia.  I ligated the ureter with a medium size clip before ligating it distally.  I then came around the posterior aspect of the kidney freeing it entirely from the surrounding tissue.  I then moved the kidney up on top of the spleen.  I reinspected the hilum and placed a 4 x 4 sheet of Surgicel over the stump of the hilum and the  adrenal gland.  I then passed a 15 mm Endo Catch bag through the assistant port and put the kidney into the Endo Catch bag.  Next we performed a tap block putting a mixture of Exparel, lidocaine, and saline into the 3 separate areas along the anterior axillary line under laparoscopic guidance into the plane between the rectus  sheath and the inner costal muscle.  We then undocked the robot and removed all the ports.  We made a periumbilical midline incision and extracted the kidney.  I closed the fascia with a looped PDS in a running fashion.  I then ran several 3-0 Vicryl interrupted stitches in the subcu region to bring the incision together and then closed it with a 4-0 Monocryl.  Prior to closing all the incisions the remainder of the Exparel was instilled into the the incisions.  The remaining incisions were closed with a 4-0 Monocryl.  The patient was subsequently extubated and returned the PACU in stable condition.   Crist Fat, M.D.

## 2023-01-07 NOTE — Transfer of Care (Signed)
Immediate Anesthesia Transfer of Care Note  Patient: Dana Kramer  Procedure(s) Performed: XI ROBOTIC ASSISTED LAPAROSCOPIC NEPHRECTOMY WITH TAP BLOCK (Left)  Patient Location: PACU  Anesthesia Type:General  Level of Consciousness: drowsy and patient cooperative  Airway & Oxygen Therapy: Patient Spontanous Breathing and Patient connected to face mask oxygen  Post-op Assessment: Report given to RN and Post -op Vital signs reviewed and stable  Post vital signs: Reviewed and stable  Last Vitals:  Vitals Value Taken Time  BP 126/74 01/07/23 1102  Temp    Pulse 89 01/07/23 1105  Resp 22 01/07/23 1105  SpO2 100 % 01/07/23 1105  Vitals shown include unfiled device data.  Last Pain:  Vitals:   01/07/23 0625  PainSc: 0-No pain      Patients Stated Pain Goal: 5 (01/07/23 8315)  Complications: No notable events documented.

## 2023-01-07 NOTE — H&P (Signed)
Left renal mass   57 year old other was reasonably healthy female with a history of breast cancer 2022 presents today for further evaluation and management of a left renal mass. The patient had a CT scan in follow-up from a abdominal ultrasound which was performed for a cervical abnormality. The cervical abnormality turned out to be of no clinical significance, but the CT scan did demonstrate this large left-sided renal mass. The patient is not complaining of any abdominal pain. She denies any hematuria or dysuria.   The patient was treated with bilateral mastectomy for breast cancer.She tolerated the procedure without issue. She takes medication for high blood pressure and hyperlipidemia, but otherwise is healthy. She is quite thin, and has been unable to gain any weight.   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     ALLERGIES: Iodine Penicillin    MEDICATIONS: Levothyroxine Sodium 25 mcg tablet  Metoprolol Succinate 50 mg tablet, extended release 24 hr  Simvastatin 20 mg tablet  Amlodipine Besylate 5 mg tablet  Entresto 24 mg-26 mg tablet  Tamoxifen Citrate 20 mg tablet  Trazodone Hcl 50 mg tablet     GU PSH: None   NON-GU PSH: Cesarean Delivery Only Mastoidectomy     GU PMH: None   NON-GU PMH: Breast Cancer, History Hypercholesterolemia Hypertension Hypothyroidism    FAMILY HISTORY: Hypertension - Mother   SOCIAL HISTORY: Marital Status: Married Preferred Language: English; Ethnicity: Not Hispanic Or Latino; Race: Other Race Current Smoking Status: Patient has never smoked.   Tobacco Use Assessment Completed: Used Tobacco in last 30 days? Does not drink anymore.  Drinks 1 caffeinated drink per day.    REVIEW OF SYSTEMS:    GU Review Female:   Patient reports get up at night to urinate. Patient denies frequent urination, hard to postpone urination, burning /pain with urination, leakage of urine, stream starts and stops, trouble starting your  stream, have to strain to urinate, and being pregnant.  Gastrointestinal (Upper):   Patient denies nausea, vomiting, and indigestion/ heartburn.  Gastrointestinal (Lower):   Patient denies diarrhea and constipation.  Constitutional:   Patient denies fever, night sweats, weight loss, and fatigue.  Skin:   Patient denies skin rash/ lesion and itching.  Eyes:   Patient denies blurred vision and double vision.  Ears/ Nose/ Throat:   Patient denies sore throat and sinus problems.  Hematologic/Lymphatic:   Patient denies swollen glands and easy bruising.  Cardiovascular:   Patient denies leg swelling and chest pains.  Respiratory:   Patient denies cough and shortness of breath.  Endocrine:   Patient denies excessive thirst.  Musculoskeletal:   Patient reports back pain. Patient denies joint pain.  Neurological:   Patient denies headaches and dizziness.  Psychologic:   Patient denies depression and anxiety.   VITAL SIGNS:      01/05/2023 03:05 PM  Weight 110 lb / 49.9 kg  Height 60 in / 152.4 cm  BP 131/85 mmHg  Pulse 90 /min  Temperature 97.8 F / 36.5 C  BMI 21.5 kg/m   MULTI-SYSTEM PHYSICAL EXAMINATION:    Constitutional: Well-nourished. No physical deformities. Normally developed. Good grooming.  Neck: Neck symmetrical, not swollen. Normal tracheal position.  Respiratory: Normal breath sounds. No labored breathing, no use of accessory muscles.   Cardiovascular: Regular rate and rhythm. No murmur, no gallop. Normal temperature, normal extremity pulses, no swelling, no varicosities.   Lymphatic: No enlargement of neck, axillae, groin.  Skin: No paleness, no jaundice, no  cyanosis. No lesion, no ulcer, no rash.  Neurologic / Psychiatric: Oriented to time, oriented to place, oriented to person. No depression, no anxiety, no agitation.  Gastrointestinal: No mass, no tenderness, no rigidity, non obese abdomen.  Eyes: Normal conjunctivae. Normal eyelids.  Ears, Nose, Mouth, and Throat: Left  ear no scars, no lesions, no masses. Right ear no scars, no lesions, no masses. Nose no scars, no lesions, no masses. Normal hearing. Normal lips.  Musculoskeletal: Normal gait and station of head and neck.     Complexity of Data:  Source Of History:  Patient  Records Review:   AUA Symptom Score, Previous Doctor Records, Previous Patient Records, POC Tool  Urine Test Review:   Urinalysis  X-Ray Review: C.T. Abdomen/Pelvis: Reviewed Films. Discussed With Patient.     PROCEDURES:          Urinalysis w/Scope Dipstick Dipstick Cont'd Micro  Color: Yellow Bilirubin: Neg mg/dL WBC/hpf: 0 - 5/hpf  Appearance: Slightly Cloudy Ketones: Neg mg/dL RBC/hpf: 0 - 2/hpf  Specific Gravity: 1.025 Blood: Neg ery/uL Bacteria: Few (10-25/hpf)  pH: 6.0 Protein: Neg mg/dL Cystals: NS (Not Seen)  Glucose: Neg mg/dL Urobilinogen: 0.2 mg/dL Casts: NS (Not Seen)    Nitrites: Neg Trichomonas: Not Present    Leukocyte Esterase: Neg leu/uL Mucous: Not Present      Epithelial Cells: 0 - 5/hpf      Yeast: NS (Not Seen)      Sperm: Not Present    ASSESSMENT:      ICD-10 Details  1 NON-GU:   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 However, I do not think it invades into the renal vein and it appears to be completely contained within the kidney with no evidence of metastatic disease.   I detailed the various treatment options with the patient and ultimately I recommended a laparoscopic radical nephrectomy. I went over this operation in detail with the patient including the risks and benefits. We discussed port position and I outlined the position of the 4 planned trocars. I also explained to him that he will need extraction incision which typically is in the left lower quadrant. I discussed the actual surgery with him and we went over the  various structures better intimate association with the kidney and the risk of damage thereof. I outlined the risk of injury to the major nerves and vessels in proximity to the kidney. I explained the patient the expected hospital course. I told him that he should plan to be in the hospital at least 2-3 days. Further, I told him that he would likely need approximately 1 month to fully recover. Given the severity of this planned operation, we will have the patient be seen by his primary care doctor and cleared for surgery. We will plan to schedule this within the next month.

## 2023-01-07 NOTE — Anesthesia Procedure Notes (Signed)
Procedure Name: Intubation Date/Time: 01/07/2023 7:43 AM  Performed by: Sindy Guadeloupe, CRNAPre-anesthesia Checklist: Patient identified, Emergency Drugs available, Suction available, Patient being monitored and Timeout performed Patient Re-evaluated:Patient Re-evaluated prior to induction Oxygen Delivery Method: Circle system utilized Preoxygenation: Pre-oxygenation with 100% oxygen Induction Type: IV induction Ventilation: Mask ventilation without difficulty Laryngoscope Size: Mac and 3 Grade View: Grade I Tube type: Oral Tube size: 7.0 mm Number of attempts: 1 Airway Equipment and Method: Stylet Placement Confirmation: ETT inserted through vocal cords under direct vision, positive ETCO2 and breath sounds checked- equal and bilateral Secured at: 21 cm Tube secured with: Tape Dental Injury: Teeth and Oropharynx as per pre-operative assessment

## 2023-01-07 NOTE — Interval H&P Note (Signed)
History and Physical Interval Note:  01/07/2023 7:28 AM  Dana Kramer  has presented today for surgery, with the diagnosis of LEFT RENAL MASS.  The various methods of treatment have been discussed with the patient and family. After consideration of risks, benefits and other options for treatment, the patient has consented to  Procedure(s): XI ROBOTIC ASSISTED LAPAROSCOPIC NEPHRECTOMY (Left) as a surgical intervention.  The patient's history has been reviewed, patient examined, no change in status, stable for surgery.  I have reviewed the patient's chart and labs.  Questions were answered to the patient's satisfaction.     Crist Fat

## 2023-01-07 NOTE — Anesthesia Postprocedure Evaluation (Signed)
Anesthesia Post Note  Patient: Dana Kramer  Procedure(s) Performed: XI ROBOTIC ASSISTED LAPAROSCOPIC NEPHRECTOMY WITH TAP BLOCK (Left)     Patient location during evaluation: PACU Anesthesia Type: General Level of consciousness: awake Pain management: pain level controlled Vital Signs Assessment: post-procedure vital signs reviewed and stable Respiratory status: spontaneous breathing, nonlabored ventilation and respiratory function stable Cardiovascular status: blood pressure returned to baseline and stable Postop Assessment: no apparent nausea or vomiting Anesthetic complications: no   No notable events documented.  Last Vitals:  Vitals:   01/07/23 1245 01/07/23 1300  BP: 107/65 105/66  Pulse: 75 75  Resp: 12 11  Temp:    SpO2: 96% 96%    Last Pain:  Vitals:   01/07/23 1300  PainSc: 5                  Elasha Tess P Jesselle Laflamme

## 2023-01-08 ENCOUNTER — Encounter (HOSPITAL_COMMUNITY): Payer: Self-pay | Admitting: Urology

## 2023-01-08 LAB — BASIC METABOLIC PANEL
Anion gap: 7 (ref 5–15)
BUN: 13 mg/dL (ref 6–20)
CO2: 20 mmol/L — ABNORMAL LOW (ref 22–32)
Calcium: 8.4 mg/dL — ABNORMAL LOW (ref 8.9–10.3)
Chloride: 111 mmol/L (ref 98–111)
Creatinine, Ser: 0.89 mg/dL (ref 0.44–1.00)
GFR, Estimated: >60 mL/min (ref >60)
Glucose, Bld: 113 mg/dL — ABNORMAL HIGH (ref 70–99)
Potassium: 4 mmol/L (ref 3.5–5.1)
Sodium: 138 mmol/L (ref 135–145)

## 2023-01-08 LAB — HEMOGLOBIN AND HEMATOCRIT, BLOOD
HCT: 29.6 % — ABNORMAL LOW (ref 36.0–46.0)
Hemoglobin: 9.6 g/dL — ABNORMAL LOW (ref 12.0–15.0)

## 2023-01-08 MED ORDER — ACETAMINOPHEN 500 MG PO TABS: 1000.0000 mg | ORAL_TABLET | Freq: Four times a day (QID) | ORAL | 0 refills | Status: AC

## 2023-01-08 MED ORDER — TRAMADOL HCL 50 MG PO TABS: 50.0000 mg | ORAL_TABLET | Freq: Four times a day (QID) | ORAL | 0 refills | Status: AC | PRN

## 2023-01-08 MED ORDER — DOCUSATE SODIUM 100 MG PO CAPS: 100.0000 mg | ORAL_CAPSULE | Freq: Every day | ORAL | 0 refills | Status: AC | PRN

## 2023-01-08 NOTE — Plan of Care (Signed)
  Problem: Education: Goal: Knowledge of the procedure and recovery process will improve Outcome: Completed/Met   Problem: Bowel/Gastric: Goal: Gastrointestinal status for postoperative course will improve Outcome: Completed/Met   Problem: Pain Management: Goal: General experience of comfort will improve Outcome: Completed/Met   Problem: Skin Integrity: Goal: Demonstration of wound healing without infection will improve Outcome: Completed/Met   Problem: Urinary Elimination: Goal: Ability to avoid or minimize complications of infection will improve Outcome: Completed/Met Goal: Ability to achieve and maintain urine output will improve Outcome: Completed/Met Goal: Home care management will improve Outcome: Completed/Met   Problem: Education: Goal: Knowledge of General Education information will improve Description: Including pain rating scale, medication(s)/side effects and non-pharmacologic comfort measures Outcome: Completed/Met   Problem: Health Behavior/Discharge Planning: Goal: Ability to manage health-related needs will improve Outcome: Completed/Met   Problem: Clinical Measurements: Goal: Ability to maintain clinical measurements within normal limits will improve Outcome: Completed/Met Goal: Will remain free from infection Outcome: Completed/Met Goal: Diagnostic test results will improve Outcome: Completed/Met Goal: Respiratory complications will improve Outcome: Completed/Met Goal: Cardiovascular complication will be avoided Outcome: Completed/Met   Problem: Activity: Goal: Risk for activity intolerance will decrease Outcome: Completed/Met   Problem: Nutrition: Goal: Adequate nutrition will be maintained Outcome: Completed/Met   Problem: Coping: Goal: Level of anxiety will decrease Outcome: Completed/Met   Problem: Elimination: Goal: Will not experience complications related to bowel motility Outcome: Completed/Met Goal: Will not experience  complications related to urinary retention Outcome: Completed/Met   Problem: Pain Management: Goal: General experience of comfort will improve Outcome: Completed/Met   Problem: Safety: Goal: Ability to remain free from injury will improve Outcome: Completed/Met   Problem: Skin Integrity: Goal: Risk for impaired skin integrity will decrease Outcome: Completed/Met   Problem: Education: Goal: Knowledge of the prescribed therapeutic regimen will improve Outcome: Completed/Met   Problem: Bowel/Gastric: Goal: Gastrointestinal status for postoperative course will improve Outcome: Completed/Met   Problem: Clinical Measurements: Goal: Postoperative complications will be avoided or minimized Outcome: Completed/Met   Problem: Respiratory: Goal: Ability to achieve and maintain a regular respiratory rate will improve Outcome: Completed/Met   Problem: Skin Integrity: Goal: Demonstration of wound healing without infection will improve Outcome: Completed/Met   Problem: Urinary Elimination: Goal: Ability to avoid or minimize complications of infection will improve Outcome: Completed/Met Goal: Ability to achieve and maintain urine output will improve Outcome: Completed/Met

## 2023-01-08 NOTE — Discharge Instructions (Signed)

## 2023-01-08 NOTE — Discharge Summary (Signed)
Date of admission: 01/07/2023  Date of discharge: 01/08/2023  Admission diagnosis:  Left renal mass [N28.89]   Discharge diagnosis:  Left renal mass [N28.89]  Secondary diagnoses:   Active Ambulatory Problems    Diagnosis Date Noted   Umbilical hernia 11/19/2010   Insomnia 11/05/2011   Anxiety 11/05/2011   Hyperlipidemia 11/05/2011   Tachycardia 11/05/2011   Migraine 01/18/2015   Acquired hypothyroidism 04/14/2018   Pre-diabetes 11/06/2020   Malignant neoplasm of upper-outer quadrant of right breast in female, estrogen receptor positive (HCC) 01/13/2021   Family history of colon cancer 01/15/2021   S/P mastectomy, bilateral 01/23/2021   Genetic testing 01/27/2021   Port-A-Cath in place 03/04/2021   Resolved Ambulatory Problems    Diagnosis Date Noted   Screening for malignant neoplasm of cervix 12/08/2011   Encounter for routine gynecological examination 12/08/2011   Bronchitis, acute 01/21/2012   Pink eye 01/21/2012   Past Medical History:  Diagnosis Date   Allergy 11/1998   Anemia    Chicken pox    History of transfusion of whole blood    Hypertension    Hypothyroidism    Thyroid disease      History and Physical: For full details, please see admission history and physical. Briefly, Dana Kramer is a 57 y.o. year old patient who was admitted with left renal mass.   Hospital Course:   Pt admitted and underwent left nephrectomy on 01/07/2023. Their hospital course was unremarkable. By POD1, they were tolerating a regular diet, voiding spontaneously, pain was controlled with oral medications, and they were deemed appropriate for discharge.   Their course was complicated by: None  On the day of discharge, the patient was tolerating a regular diet and their pain was well controlled. They were determined to be stable for discharge home  Laboratory values:  Recent Labs    01/07/23 0610 01/07/23 1153 01/08/23 0359  HGB 12.1 10.3* 9.6*  HCT 36.8 31.8* 29.6*   Recent  Labs    01/07/23 1153 01/08/23 0359  CREATININE 0.93 0.89    Disposition: Home  Discharge medications:  Allergies as of 01/08/2023       Reactions   Betadine [povidone Iodine] Rash   Penicillins Rash   Mainly upper body only.        Medication List     TAKE these medications    acetaminophen 500 MG tablet Commonly known as: TYLENOL Take 2 tablets (1,000 mg total) by mouth every 6 (six) hours for 7 days.   alendronate 70 MG tablet Commonly known as: FOSAMAX Take 1 tablet (70 mg total) by mouth every 7 (seven) days. Take with a full glass of water on an empty stomach.   amLODipine 5 MG tablet Commonly known as: NORVASC Take 1 tablet (5 mg total) by mouth daily.   b complex vitamins capsule Take 1 capsule by mouth daily.   CALCIUM + D PO Take 1 tablet by mouth daily.   docusate sodium 100 MG capsule Commonly known as: Colace Take 1 capsule (100 mg total) by mouth daily as needed for up to 7 days.   Entresto 24-26 MG Generic drug: sacubitril-valsartan Take 1 tablet by mouth 2 (two) times daily.   levothyroxine 25 MCG tablet Commonly known as: SYNTHROID TAKE 1 TABLET(25 MCG) BY MOUTH DAILY BEFORE BREAKFAST   meclizine 25 MG tablet Commonly known as: ANTIVERT Take 1 tablet (25 mg total) by mouth 3 (three) times daily as needed for dizziness.   melatonin 5 MG Tabs Take 5  mg by mouth at bedtime as needed (Sleep).   metoprolol succinate 50 MG 24 hr tablet Commonly known as: TOPROL-XL TAKE 1 TABLET(50 MG) BY MOUTH DAILY WITH OR IMMEDIATELY FOLLOWING A MEAL   simvastatin 20 MG tablet Commonly known as: ZOCOR TAKE 1 TABLET(20 MG) BY MOUTH AT BEDTIME   tamoxifen 20 MG tablet Commonly known as: NOLVADEX TAKE 1 TABLET(20 MG) BY MOUTH DAILY   traMADol 50 MG tablet Commonly known as: Ultram Take 1-2 tablets (50-100 mg total) by mouth every 6 (six) hours as needed for up to 7 days.   traZODone 50 MG tablet Commonly known as: DESYREL TAKE 1/2 TO 1 TABLET(25  TO 50 MG) BY MOUTH AT BEDTIME AS NEEDED FOR SLEEP   VITAMIN D PO Take 2,000 Units by mouth daily.   WOMENS MULTIVITAMIN PO Take 1 tablet by mouth daily.        Followup:   Follow-up Information     Vanessa Barbara, NP Follow up on 01/25/2023.   Why: 2:45pm Contact information: [address] [place] [address] [phone]

## 2023-01-08 NOTE — TOC Initial Note (Signed)
Transition of Care [facility]) - Initial/Assessment Note    Patient Details  Name: Dana Kramer MRN: [id number] Date of Birth: [birth year]  [facility]) CM/SW Contact:    Adrian Prows, RN Phone Number: 01/08/2023, 9:12 AM  Clinical Narrative:                 Spoke w/ pt in room; pt says she is from home and plans to return at d/c; she identified POC Valetta Fuller (dtr) [phone]; she denies SDOH risk; she has transportation; no TOC needs.  Expected Discharge Plan: Home/Self Care Barriers to Discharge: No Barriers Identified   Patient Goals and CMS Choice Patient states their goals for this hospitalization and ongoing recovery are:: home          Expected Discharge Plan and Services   Discharge Planning Services: CM Consult Post Acute Care Choice: NA Living arrangements for the past 2 months: Single Family Home Expected Discharge Date: 01/08/23               DME Arranged: N/A DME Agency: NA       HH Arranged: NA HH Agency: NA        Prior Living Arrangements/Services Living arrangements for the past 2 months: Single Family Home Lives with:: Spouse Patient language and need for interpreter reviewed:: Yes Do you feel safe going back to the place where you live?: Yes      Need for Family Participation in Patient Care: Yes (Comment) Care giver support system in place?: Yes (comment) Current home services:  (n/a) Criminal Activity/Legal Involvement Pertinent to Current Situation/Hospitalization: No - Comment as needed  Activities of Daily Living   ADL Screening (condition at time of admission) Independently performs ADLs?: Yes (appropriate for developmental age) Is the patient deaf or have difficulty hearing?: No Does the patient have difficulty seeing, even when wearing glasses/contacts?: No Does the patient have difficulty concentrating, remembering, or making decisions?: No  Permission Sought/Granted Permission sought to share information with  : Case Manager Permission granted to share information with : Yes, Verbal Permission Granted  Share Information with NAME: Case Manager     Permission granted to share info w Relationship: Darius Bump (dtr) [phone]     Emotional Assessment Appearance:: Appears stated age Attitude/Demeanor/Rapport: Gracious Affect (typically observed): Accepting Orientation: : Oriented to Self, Oriented to Place, Oriented to  Time, Oriented to Situation Alcohol / Substance Use: Not Applicable Psych Involvement: No (comment)  Admission diagnosis:  Left renal mass [N28.89] Patient Active Problem List   Diagnosis Date Noted   Left renal mass 01/07/2023   Port-A-Cath in place 03/04/2021   Genetic testing 01/27/2021   S/P mastectomy, bilateral 01/23/2021   Family history of colon cancer 01/15/2021   Malignant neoplasm of upper-outer quadrant of right breast in female, estrogen receptor positive (HCC) 01/13/2021   Pre-diabetes 11/06/2020   Acquired hypothyroidism 04/14/2018   Migraine 01/18/2015   Insomnia 11/05/2011   Anxiety 11/05/2011   Hyperlipidemia 11/05/2011   Tachycardia 11/05/2011   Umbilical hernia 11/19/2010   PCP:  Pearline Cables, MD Pharmacy:   [facility] [phone] [place], [place] - [address] AT [facility] OF [address] [place] [address] Phone: [phone] Fax: [phone]     Social Determinants of Health (SDOH) Social History: SDOH Screenings   Food Insecurity: No Food Insecurity (01/08/2023)  Housing: Low Risk  (01/08/2023)  Transportation Needs: No Transportation Needs (01/08/2023)  Utilities: Not At Risk (01/08/2023)  Depression (PHQ2-9): Low Risk  (12/07/2022)  Social Connections: Unknown (07/20/2021)   [facility] from [facility]  Tobacco Use: Low Risk  (01/07/2023)   SDOH Interventions: Food Insecurity Interventions: Intervention Not Indicated, Inpatient TOC Housing Interventions: Intervention Not  Indicated, Inpatient TOC Transportation Interventions: Intervention Not Indicated, Inpatient TOC Utilities Interventions: Intervention Not Indicated, Inpatient TOC   Readmission Risk Interventions     No data to display

## 2023-01-10 ENCOUNTER — Other Ambulatory Visit: Payer: Self-pay | Admitting: Family Medicine

## 2023-01-10 DIAGNOSIS — I10 Essential (primary) hypertension: Secondary | ICD-10-CM

## 2023-01-10 DIAGNOSIS — R000 Tachycardia, unspecified: Secondary | ICD-10-CM

## 2023-01-11 ENCOUNTER — Other Ambulatory Visit: Payer: Managed Care, Other (non HMO)

## 2023-01-11 LAB — SURGICAL PATHOLOGY

## 2023-01-13 MED ORDER — TRAMADOL HCL 50 MG PO TABS: 50.0000 mg | ORAL_TABLET | Freq: Four times a day (QID) | ORAL | 0 refills | Status: DC | PRN

## 2023-01-13 NOTE — Addendum Note (Signed)
Addended by: Abbe Amsterdam C on: 01/13/2023 12:39 PM   Modules accepted: Orders

## 2023-01-19 ENCOUNTER — Inpatient Hospital Stay: Payer: Managed Care, Other (non HMO) | Admitting: Hematology

## 2023-01-19 ENCOUNTER — Other Ambulatory Visit: Payer: Self-pay

## 2023-01-19 ENCOUNTER — Inpatient Hospital Stay: Payer: Managed Care, Other (non HMO) | Attending: Hematology

## 2023-01-19 VITALS — BP 105/75 | HR 78 | Temp 97.8°F | Wt 109.2 lb

## 2023-01-19 DIAGNOSIS — Z170 Estrogen receptor positive status [ER+]: Secondary | ICD-10-CM | POA: Insufficient documentation

## 2023-01-19 DIAGNOSIS — C50411 Malignant neoplasm of upper-outer quadrant of right female breast: Secondary | ICD-10-CM | POA: Insufficient documentation

## 2023-01-19 DIAGNOSIS — Z79810 Long term (current) use of selective estrogen receptor modulators (SERMs): Secondary | ICD-10-CM | POA: Insufficient documentation

## 2023-01-19 DIAGNOSIS — C642 Malignant neoplasm of left kidney, except renal pelvis: Secondary | ICD-10-CM | POA: Insufficient documentation

## 2023-01-19 DIAGNOSIS — Z9013 Acquired absence of bilateral breasts and nipples: Secondary | ICD-10-CM | POA: Diagnosis not present

## 2023-01-19 LAB — CBC WITH DIFFERENTIAL (CANCER CENTER ONLY)
Abs Immature Granulocytes: 0.02 10*3/uL (ref 0.00–0.07)
Basophils Absolute: 0.1 10*3/uL (ref 0.0–0.1)
Basophils Relative: 1 %
Eosinophils Absolute: 0.2 10*3/uL (ref 0.0–0.5)
Eosinophils Relative: 3 %
HCT: 34.8 % — ABNORMAL LOW (ref 36.0–46.0)
Hemoglobin: 11.7 g/dL — ABNORMAL LOW (ref 12.0–15.0)
Immature Granulocytes: 0 %
Lymphocytes Relative: 36 %
Lymphs Abs: 2.4 10*3/uL (ref 0.7–4.0)
MCH: 28.8 pg (ref 26.0–34.0)
MCHC: 33.6 g/dL (ref 30.0–36.0)
MCV: 85.7 fL (ref 80.0–100.0)
Monocytes Absolute: 0.3 10*3/uL (ref 0.1–1.0)
Monocytes Relative: 5 %
Neutro Abs: 3.8 10*3/uL (ref 1.7–7.7)
Neutrophils Relative %: 55 %
Platelet Count: 323 10*3/uL (ref 150–400)
RBC: 4.06 MIL/uL (ref 3.87–5.11)
RDW: 11.7 % (ref 11.5–15.5)
WBC Count: 6.8 10*3/uL (ref 4.0–10.5)
nRBC: 0 % (ref 0.0–0.2)

## 2023-01-19 LAB — CMP (CANCER CENTER ONLY)
ALT: 6 U/L (ref 0–44)
AST: 17 U/L (ref 15–41)
Albumin: 4.1 g/dL (ref 3.5–5.0)
Alkaline Phosphatase: 52 U/L (ref 38–126)
Anion gap: 7 (ref 5–15)
BUN: 15 mg/dL (ref 6–20)
CO2: 26 mmol/L (ref 22–32)
Calcium: 9.5 mg/dL (ref 8.9–10.3)
Chloride: 103 mmol/L (ref 98–111)
Creatinine: 1.1 mg/dL — ABNORMAL HIGH (ref 0.44–1.00)
GFR, Estimated: 59 mL/min — ABNORMAL LOW (ref >60)
Glucose, Bld: 107 mg/dL — ABNORMAL HIGH (ref 70–99)
Potassium: 4.2 mmol/L (ref 3.5–5.1)
Sodium: 136 mmol/L (ref 135–145)
Total Bilirubin: 0.3 mg/dL (ref <1.2)
Total Protein: 7.2 g/dL (ref 6.5–8.1)

## 2023-01-19 NOTE — Assessment & Plan Note (Signed)
Stage IA, c(T1bN0M0), Triple positive, Grade 2 -found on screening mammogram. 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 -S/p b/l mastectomies on 01/23/21 with Dr. Luisa Hart. 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 Port was placed during surgery -Baseline EF 60-65% with impaired relaxation (grade I diastolic dysfunction). Will monitor q3 months on herceptin -s/p weekly Abraxane/Herceptin x12 02/25/21 - 05/12/21, -she completed one year maintenance herceptin injection in Jan 2024 -she began tamoxifen on 06/20/21. She is tolerating well overall. She notes she has not had a period since completing chemo. We will check her hormone levels in 1-2 years and we can consider switching her to AI when she became postmenopausal. -repeat echo 10/21/21 showed slightly lower EF at 50-55%. She was seen by cardiologist and started on entresto and trastuzumab was held for a few months and restarted after improved EF on follow-ups echo.

## 2023-01-19 NOTE — Progress Notes (Signed)
Dana Kramer   [phone] [phone]   Clinic Follow up Note   Patient Care Team: Copland, Gwenlyn Found, MD as PCP - General (Family Medicine) Harriette Bouillon, MD as Consulting Physician (General Surgery) Malachy Mood, MD as Consulting Physician (Hematology) Lonie Peak, MD as Attending Physician (Radiation Oncology) Pershing Proud, RN as Oncology Nurse Navigator Donnelly Angelica, RN as Oncology Nurse Navigator Bensimhon, Bevelyn Buckles, MD as Consulting Physician (Cardiology) Carlean Jews, NP as Nurse Practitioner (Hematology and Oncology)  Date of Service:  01/19/2023  CHIEF COMPLAINT: f/u of breast cancer  CURRENT THERAPY:  Tamoxifen  Oncology History   Malignant neoplasm of upper-outer quadrant of right breast in female, estrogen receptor positive (HCC) Stage IA, c(T1bN0M0), Triple positive, Grade 2 -found on screening mammogram. 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 -S/p b/l mastectomies on 01/23/21 with Dr. Luisa Hart. 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 Port was placed during surgery -Baseline EF 60-65% with impaired relaxation (grade I diastolic dysfunction). Will monitor q3 months on herceptin -s/p weekly Abraxane/Herceptin x12 02/25/21 - 05/12/21, -she completed one year maintenance herceptin injection in Jan 2024 -she began tamoxifen on 06/20/21. She is tolerating well overall. She notes she has not had a period since completing chemo. We will check her hormone levels in 1-2 years and we can consider switching her to AI when she became postmenopausal. -repeat echo 10/21/21 showed slightly lower EF at 50-55%. She was seen by cardiologist and started on entresto and trastuzumab was held for a few months and restarted after improved EF on follow-ups echo.    Assessment and Plan    Breast Cancer Follow-up for breast cancer. Currently on tamoxifen with no reported issues. Bilateral  mastectomy performed, no need for mammogram. Bone density scan in October showed worsening to -3.2. Reports fatigue and mild pain previously. - Schedule Zometa infusion next week - Continue calcium and vitamin D supplementation - Monitor for side effects such as cramps - Encourage self-examination of the chest area  Kidney Cancer Newly diagnosed kidney cancer, stage 1, grade 3, 6.5 cm clear cell mass. Prognosis is better due to early stage and complete tumor removal. Reports back pain and weight loss, which have improved post-surgery. Discussed recurrence risk and importance of surveillance. -Adjuvant Rande Lawman is not indicated per NCCN guideline - Order CT scan of abdomen and pelvis every six months for the first two to three years, then annually for a total of five years - Include optional CT chest - Coordinate with urologist Dr. Berniece Salines for follow-up and CT scan scheduling - Ensure hydration and avoid NSAIDs - Monitor kidney function with labs every three to six months  Osteoporosis Bone density scan in October showed worsening to -3.2. Scheduled for Zometa infusion and taking calcium and vitamin D. Discussed the importance of supplementation to prevent hypocalcemia and potential side effects of Zometa. - Schedule Zometa infusion next week - Continue calcium and vitamin D supplementation - Monitor for side effects such as cramps  Hypertension On amlodipine and Entresto for blood pressure management. Blood pressure is well-controlled. Discussed potential discontinuation of Entresto and need to monitor blood pressure closely if changes are made. - Send a message to Dr. Shirlee Latch to discuss potential changes to blood pressure medication - Monitor blood pressure closely if any changes are made to medication  General Health Maintenance Concerned about overall health and lifestyle. No family history of breast or renal cancer. Genetic tests done  previously. On FMLA until December 15th and  considering a trip to [place] for mental relaxation and weight gain. Discussed importance of hydration and avoiding NSAIDs due to kidney function. - Encourage hydration, at least 50 ounces of water daily - Advise against taking NSAIDs - Recommend multivitamins to support overall health - Monitor hemoglobin and other blood counts regularly - Encourage taking time to recover from surgery  Follow-up - Send a message to Dr. Berniece Salines regarding surveilling CT scan scheduling and follow-up for her kidney cancer - Schedule next appointment in six months - Ensure scheduling of Zometa infusion next week.  -Lab, follow-up and Zometa infusion in 6 months -Continue tamoxifen       SUMMARY OF ONCOLOGIC HISTORY: Oncology History Overview Note   Cancer Staging  Malignant neoplasm of upper-outer quadrant of right breast in female, estrogen receptor positive (HCC) Staging form: Breast, AJCC 8th Edition - Clinical stage from 01/08/2021: Stage IA (cT1b, cN0, cM0, G2, ER+, PR+, HER2+) - Signed by Malachy Mood, MD on 01/15/2021 - Pathologic stage from 01/23/2021: Stage IA (pT1c, pN0, cM0, G2, ER+, PR+, HER2+) - Signed by Malachy Mood, MD on 02/13/2021     Malignant neoplasm of upper-outer quadrant of right breast in female, estrogen receptor positive (HCC)  01/02/2021 Mammogram   EXAM: DIGITAL DIAGNOSTIC BILATERAL MAMMOGRAM WITH TOMOSYNTHESIS AND CAD; ULTRASOUND LEFT BREAST LIMITED; ULTRASOUND RIGHT BREAST LIMITED  IMPRESSION: 1. Highly suspicious 0.7 cm UPPER-OUTER RIGHT breast mass with associated suspicious calcifications extending 1.5 cm anteriorly. Tissue sampling is recommended. 2. No abnormal appearing RIGHT axillary lymph nodes. 3. Benign LOWER LEFT breast cyst corresponding to the LEFT breast screening study finding.   01/08/2021 Cancer Staging   Staging form: Breast, AJCC 8th Edition - Clinical stage from 01/08/2021: Stage IA (cT1b, cN0, cM0, G2, ER+, PR+, HER2+) - Signed by Malachy Mood, MD on  01/15/2021 Stage prefix: Initial diagnosis Nuclear grade: G2 Histologic grading system: 3 grade system   01/08/2021 Pathology Results   Diagnosis Breast, right, needle core biopsy, upper outer quadrant, 11 o'clock, 5cmfn, ribbon clip - INVASIVE DUCTAL CARCINOMA - DUCTAL CARCINOMA IN SITU WITH CALCIFICATIONS - SEE COMMENT Microscopic Comment Based on the biopsy, the carcinoma appears Nottingham grade 2 of 3 and measures 0.6 cm in greatest linear extent.  PROGNOSTIC INDICATORS Results: The tumor cells are 2+ for Her2 (EQUIVOCAL). Her2 by FISH will be performed and results reported separately. Estrogen Receptor: 100%, POSITIVE, STRONG STAINING INTENSITY Progesterone Receptor: 70%, POSITIVE, STRONG STAINING INTENSITY Proliferation Marker Ki67: 10%  FLUORESCENCE IN-SITU HYBRIDIZATION Results: GROUP 1: HER2 **POSITIVE**   01/13/2021 Initial Diagnosis   Malignant neoplasm of upper-outer quadrant of right breast in female, estrogen receptor positive (HCC)   01/15/2021 Genetic Testing   [facility] CustomNext Panel was Negative. Report date is 01/26/2021.  The CustomNext-Cancer+RNAinsight panel offered by [employer] includes sequencing and rearrangement analysis for the following 47 genes:  APC, ATM, AXIN2, BARD1, BMPR1A, BRCA1, BRCA2, BRIP1, CDH1, CDK4, CDKN2A, CHEK2, CTNNA1, DICER1, EPCAM, GREM1, HOXB13, KIT, MEN1, MLH1, MSH2, MSH3, MSH6, MUTYH, NBN, NF1, NTHL1, PALB2, PDGFRA, PMS2, POLD1, POLE, PTEN, RAD50, RAD51C, RAD51D, SDHA, SDHB, SDHC, SDHD, SMAD4, SMARCA4, STK11, TP53, TSC1, TSC2, and VHL.  RNA data is routinely analyzed for use in variant interpretation for all genes.   01/23/2021 Cancer Staging   Staging form: Breast, AJCC 8th Edition - Pathologic stage from 01/23/2021: Stage IA (pT1c, pN0, cM0, G2, ER+, PR+, HER2+) - Signed by Malachy Mood, MD on 02/13/2021 Histologic grading system: 3 grade system Residual tumor (R):  R0 - None   01/23/2021 Definitive Surgery   FINAL MICROSCOPIC  DIAGNOSIS:   A. BREAST, LEFT, MASTECTOMY:  - Fibrocystic changes with sclerosing adenosis and calcifications.  - Fibroadenoma (0.6 cm).  - No evidence of malignancy.   B. BREAST, RIGHT, MASTECTOMY:  - Invasive and in situ ductal carcinoma, 1.7 cm.  - Margins negative for carcinoma.  - Biopsy site and biopsy clip.  - See oncology table.   C. LYMPH NODE, RIGHT AXILLARY, SENTINEL, EXCISION:  - One lymph node negative for metastatic carcinoma (0/1).   D. LYMPH NODE, RIGHT AXILLARY, SENTINEL, EXCISION:  - One lymph node negative for metastatic carcinoma (0/1).   E. LYMPH NODE, RIGHT AXILLARY, SENTINEL, EXCISION:  - One lymph node negative for metastatic carcinoma (0/1).   F. LYMPH NODE, RIGHT AXILLARY, SENTINEL, EXCISION:  - One lymph node negative for metastatic carcinoma (0/1).    02/25/2021 - 10/22/2021 Chemotherapy   Patient is on Treatment Plan : BREAST Paclitaxel + Trastuzumab q7d / Trastuzumab q21d     02/25/2021 -  Chemotherapy   Patient is on Treatment Plan : BREAST Paclitaxel + Trastuzumab q7d / Trastuzumab q21d     Primary renal cell carcinoma of left kidney (HCC)  01/07/2023 Cancer Staging   Staging form: Kidney, AJCC 8th Edition - Pathologic stage from 01/07/2023: Stage I (pT1b, pN0, cM0) - Signed by Malachy Mood, MD on 01/19/2023 Histologic grade (G): G3 Histologic grading system: 4 grade system Residual tumor (R): R0 - None   01/19/2023 Initial Diagnosis   Primary renal cell carcinoma of left kidney (HCC)      Discussed the use of AI scribe software for clinical note transcription with the patient, who gave verbal consent to proceed.  History of Present Illness   Dana Kramer, a 57 year old female with a history of breast cancer, presents for follow-up after recent surgery for newly diagnosed kidney cancer. She reports no symptoms related to the kidney cancer, except for back pain which has since resolved post-surgery. The kidney cancer was discovered incidentally  during an ultrasound for her cervix, which led to a CT scan that revealed a 6.5 cm mass in her kidney. She underwent surgery promptly and is currently in recovery.  In addition to her kidney cancer, she continues to manage her breast cancer with tamoxifen, which she reports no problems with. She also mentions a history of hypertension, for which she takes amlodipine and Entresto. She has not experienced any issues with these medications. She also reports taking Fosamax for bone health, which is particularly important given her recent diagnosis of osteoporosis with a bone density score of -3.2.  She has been experiencing fatigue and mild pain, which she attributes to her recent surgery and recovery. She also mentions some weight loss, which she attributes to her overall health issues. She expresses a desire to gain weight and improve her overall health.         All other systems were reviewed with the patient and are negative.  MEDICAL HISTORY:  Past Medical History:  Diagnosis Date   Allergy 11/1998   Penicillin   Anemia    Chicken pox    History of transfusion of whole blood    Hyperlipidemia    Hypertension    Hypothyroidism    Malignant neoplasm of upper-outer quadrant of right breast in female, estrogen receptor positive (HCC) 01/13/2021   Migraine    Pre-diabetes    Thyroid disease    Hypothyroidism    SURGICAL HISTORY: Past Surgical History:  Procedure Laterality Date   ABDOMINOPLASTY  10/01/2010   CESAREAN SECTION     twice   HERNIA REPAIR     PORT-A-CATH REMOVAL     PORTACATH PLACEMENT Right 01/23/2021   Procedure: Right internal jugular 8 [place] PowerPort with C arm and ultrasound guidance;  Surgeon: Harriette Bouillon, MD;  Location: [facility];  Service: General;  Laterality: Right;   ROBOT ASSISTED LAPAROSCOPIC NEPHRECTOMY Left 01/07/2023   Procedure: XI ROBOTIC ASSISTED LAPAROSCOPIC NEPHRECTOMY WITH TAP BLOCK;  Surgeon: Crist Fat, MD;  Location: [facility];   Service: Urology;  Laterality: Left;   SENTINEL NODE BIOPSY Right 01/23/2021   Procedure: Right axillary sentinel lymph node mapping using Mag Trace;  Surgeon: Harriette Bouillon, MD;  Location: [facility];  Service: General;  Laterality: Right;   SIMPLE MASTECTOMY WITH AXILLARY SENTINEL NODE BIOPSY Bilateral 01/23/2021   Procedure: Right simple mastectomy ;  Left risk reducing simple mastectomy;  Surgeon: Harriette Bouillon, MD;  Location: [facility];  Service: General;  Laterality: Bilateral;   TUBAL LIGATION     umblica hernia repair  10/01/2010    I have reviewed the social history and family history with the patient and they are unchanged from previous note.  ALLERGIES:  is allergic to betadine [povidone iodine] and penicillins.  MEDICATIONS:  Current Outpatient Medications  Medication Sig Dispense Refill   amLODipine (NORVASC) 5 MG tablet Take 1 tablet (5 mg total) by mouth daily. 90 tablet 0   b complex vitamins capsule Take 1 capsule by mouth daily.     Calcium Citrate-Vitamin D (CALCIUM + D PO) Take 1 tablet by mouth daily.     Cholecalciferol (VITAMIN D PO) Take 2,000 Units by mouth daily.     levothyroxine (SYNTHROID) 25 MCG tablet TAKE 1 TABLET(25 MCG) BY MOUTH DAILY BEFORE BREAKFAST 90 tablet 3   meclizine (ANTIVERT) 25 MG tablet Take 1 tablet (25 mg total) by mouth 3 (three) times daily as needed for dizziness. (Patient not taking: Reported on 01/06/2023) 30 tablet 0   melatonin 5 MG TABS Take 5 mg by mouth at bedtime as needed (Sleep).     metoprolol succinate (TOPROL-XL) 50 MG 24 hr tablet TAKE 1 TABLET(50 MG) BY MOUTH DAILY WITH OR IMMEDIATELY FOLLOWING A MEAL 90 tablet 3   Multiple Vitamins-Minerals (WOMENS MULTIVITAMIN PO) Take 1 tablet by mouth daily.     sacubitril-valsartan (ENTRESTO) 24-26 MG Take 1 tablet by mouth 2 (two) times daily. 180 tablet 3   simvastatin (ZOCOR) 20 MG tablet TAKE 1 TABLET(20 MG) BY MOUTH AT BEDTIME 90 tablet 3   tamoxifen (NOLVADEX) 20 MG tablet TAKE 1  TABLET(20 MG) BY MOUTH DAILY 90 tablet 1   traMADol (ULTRAM) 50 MG tablet Take 1-2 tablets (50-100 mg total) by mouth every 6 (six) hours as needed. 30 tablet 0   traZODone (DESYREL) 50 MG tablet TAKE 1/2 TO 1 TABLET(25 TO 50 MG) BY MOUTH AT BEDTIME AS NEEDED FOR SLEEP 90 tablet 3   No current facility-administered medications for this visit.    PHYSICAL EXAMINATION: ECOG PERFORMANCE STATUS: 1 - Symptomatic but completely ambulatory  Vitals:   01/19/23 1249  BP: 105/75  Pulse: 78  Temp: 97.8 F (36.6 C)  SpO2: 99%   Wt Readings from Last 3 Encounters:  01/19/23 109 lb 3.2 oz (49.5 kg)  01/07/23 110 lb (49.9 kg)  12/07/22 113 lb (51.3 kg)     GENERAL:alert, no distress and comfortable SKIN: skin color, texture, turgor are normal, no rashes or  significant lesions EYES: normal, Conjunctiva are pink and non-injected, sclera clear NECK: supple, thyroid normal size, non-tender, without nodularity LYMPH:  no palpable lymphadenopathy in the cervical, axillary  LUNGS: clear to auscultation and percussion with normal breathing effort HEART: regular rate & rhythm and no murmurs and no lower extremity edema ABDOMEN:abdomen soft, non-tender and normal bowel sounds, (+) midline incision and a few laparoscopic incision have healed well. Musculoskeletal:no cyanosis of digits and no clubbing  NEURO: alert & oriented x 3 with fluent speech, no focal motor/sensory deficits  Physical Exam   BREAST: Bilateral mastectomy sites healing well, with no significant tenderness upon palpation. Skin around incision sites exhibits minimal pulling without significant tenderness.      LABORATORY DATA:  I have reviewed the data as listed    Latest Ref Rng & Units 01/19/2023   12:27 PM 01/08/2023    3:59 AM 01/07/2023   11:53 AM  CBC  WBC 4.0 - 10.5 K/uL 6.8   10.5   Hemoglobin 12.0 - 15.0 g/dL 16.1  9.6  09.6   Hematocrit 36.0 - 46.0 % 34.8  29.6  31.8   Platelets 150 - 400 K/uL 323   166          Latest Ref Rng & Units 01/19/2023   12:27 PM 01/08/2023    3:59 AM 01/07/2023   11:53 AM  CMP  Glucose 70 - 99 mg/dL 045  409  811   BUN 6 - 20 mg/dL 15  13  16    Creatinine 0.44 - 1.00 mg/dL 9.14  7.82  9.56   Sodium 135 - 145 mmol/L 136  138  139   Potassium 3.5 - 5.1 mmol/L 4.2  4.0  3.7   Chloride 98 - 111 mmol/L 103  111  108   CO2 22 - 32 mmol/L 26  20  24    Calcium 8.9 - 10.3 mg/dL 9.5  8.4  8.5   Total Protein 6.5 - 8.1 g/dL 7.2   6.0   Total Bilirubin <1.2 mg/dL 0.3   0.4   Alkaline Phos 38 - 126 U/L 52   39   AST 15 - 41 U/L 17   24   ALT 0 - 44 U/L 6   12       RADIOGRAPHIC STUDIES: I have personally reviewed the radiological images as listed and agreed with the findings in the report. No results found.    No orders of the defined types were placed in this encounter.  All questions were answered. The patient knows to call the clinic with any problems, questions or concerns. No barriers to learning was detected. The total time spent in the appointment was 40 minutes.     Malachy Mood, MD 01/19/2023

## 2023-01-20 ENCOUNTER — Other Ambulatory Visit: Payer: Self-pay | Admitting: Hematology

## 2023-01-20 NOTE — Progress Notes (Signed)
Faxed Dr. Latanya Maudlin last office note to Dr. Berniece Salines w/[facility].  Fax confirmation received.

## 2023-01-21 ENCOUNTER — Other Ambulatory Visit: Payer: Self-pay

## 2023-01-25 ENCOUNTER — Inpatient Hospital Stay: Payer: Managed Care, Other (non HMO)

## 2023-01-27 ENCOUNTER — Ambulatory Visit: Payer: Managed Care, Other (non HMO) | Attending: Surgery | Admitting: Physical Therapy

## 2023-01-27 ENCOUNTER — Encounter: Payer: Self-pay | Admitting: Hematology

## 2023-01-27 DIAGNOSIS — Z170 Estrogen receptor positive status [ER+]: Secondary | ICD-10-CM | POA: Insufficient documentation

## 2023-01-27 DIAGNOSIS — C50411 Malignant neoplasm of upper-outer quadrant of right female breast: Secondary | ICD-10-CM | POA: Insufficient documentation

## 2023-01-27 NOTE — Therapy (Signed)
OUTPATIENT PHYSICAL THERAPY SOZO SCREENING NOTE   Patient Name: Dana Kramer MRN: [id number] DOB:28-Sep-1965, 57 y.o., female Today's Date: 01/27/2023  PCP: Pearline Cables, MD REFERRING PROVIDER: Harriette Bouillon, MD   PT End of Session - 01/27/23 1157     Visit Number 11   unchanged due to screen only   PT Start Time 1156    PT Stop Time 1207    PT Time Calculation (min) 11 min    Activity Tolerance Patient tolerated treatment well    Behavior During Therapy [facility] for tasks assessed/performed             Past Medical History:  Diagnosis Date   Allergy 11/1998   Penicillin   Anemia    Chicken pox    History of transfusion of whole blood    Hyperlipidemia    Hypertension    Hypothyroidism    Malignant neoplasm of upper-outer quadrant of right breast in female, estrogen receptor positive (HCC) 01/13/2021   Migraine    Pre-diabetes    Thyroid disease    Hypothyroidism   Past Surgical History:  Procedure Laterality Date   ABDOMINOPLASTY  10/01/2010   CESAREAN SECTION     twice   HERNIA REPAIR     PORT-A-CATH REMOVAL     PORTACATH PLACEMENT Right 01/23/2021   Procedure: Right internal jugular 8 [place] PowerPort with C arm and ultrasound guidance;  Surgeon: Harriette Bouillon, MD;  Location: [facility];  Service: General;  Laterality: Right;   ROBOT ASSISTED LAPAROSCOPIC NEPHRECTOMY Left 01/07/2023   Procedure: XI ROBOTIC ASSISTED LAPAROSCOPIC NEPHRECTOMY WITH TAP BLOCK;  Surgeon: Crist Fat, MD;  Location: [facility];  Service: Urology;  Laterality: Left;   SENTINEL NODE BIOPSY Right 01/23/2021   Procedure: Right axillary sentinel lymph node mapping using Mag Trace;  Surgeon: Harriette Bouillon, MD;  Location: [facility];  Service: General;  Laterality: Right;   SIMPLE MASTECTOMY WITH AXILLARY SENTINEL NODE BIOPSY Bilateral 01/23/2021   Procedure: Right simple mastectomy ;  Left risk reducing simple mastectomy;  Surgeon: Harriette Bouillon, MD;  Location: [facility];  Service: General;   Laterality: Bilateral;   TUBAL LIGATION     umblica hernia repair  10/01/2010   Patient Active Problem List   Diagnosis Date Noted   Primary renal cell carcinoma of left kidney (HCC) 01/19/2023   Left renal mass 01/07/2023   Port-A-Cath in place 03/04/2021   Genetic testing 01/27/2021   S/P mastectomy, bilateral 01/23/2021   Family history of colon cancer 01/15/2021   Malignant neoplasm of upper-outer quadrant of right breast in female, estrogen receptor positive (HCC) 01/13/2021   Pre-diabetes 11/06/2020   Acquired hypothyroidism 04/14/2018   Migraine 01/18/2015   Insomnia 11/05/2011   Anxiety 11/05/2011   Hyperlipidemia 11/05/2011   Tachycardia 11/05/2011   Umbilical hernia 11/19/2010    REFERRING DIAG: right breast cancer at risk for lymphedema  THERAPY DIAG:  Malignant neoplasm of upper-outer quadrant of right breast in female, estrogen receptor positive (HCC)  PRECAUTIONS: right UE Lymphedema risk,   SUBJECTIVE: I had my kidney removed on 01/07/23. They found out I have kidney cancer.  PAIN:  Are you having pain? No None related to breast cancer but is having post surgical pain from recent nephrectomy secondary to kidney cancer on 01/07/23  SOZO SCREENING: Patient was assessed today using the SOZO machine to determine the lymphedema index score. This was compared to her baseline score. It was determined that she is within the recommended range when compared to her  baseline and no further action is needed at this time. She will continue SOZO screenings. These are done every 3 months for 2 years post operatively followed by every 6 months for 2 years, and then annually.   L-DEX FLOWSHEETS - 01/27/23 1200       L-DEX LYMPHEDEMA SCREENING   Measurement Type Unilateral    L-DEX MEASUREMENT EXTREMITY Upper Extremity    POSITION  Standing    DOMINANT SIDE Right    At Risk Side Right    BASELINE SCORE (UNILATERAL) -4.5    L-DEX SCORE (UNILATERAL) -3.6    VALUE CHANGE  (UNILAT) 0.9             [employer], PT 01/27/2023, 12:17 PM

## 2023-01-27 NOTE — Telephone Encounter (Signed)
Patient called and left vm to edit appointment for 12/16... Called patient to inform the availability isn't available. Message left and advise please call if need to reschedule for another day.

## 2023-01-30 ENCOUNTER — Other Ambulatory Visit: Payer: Self-pay | Admitting: Family Medicine

## 2023-01-30 DIAGNOSIS — E039 Hypothyroidism, unspecified: Secondary | ICD-10-CM

## 2023-02-01 ENCOUNTER — Other Ambulatory Visit: Payer: Self-pay | Admitting: Family Medicine

## 2023-02-01 ENCOUNTER — Other Ambulatory Visit: Payer: Managed Care, Other (non HMO)

## 2023-02-01 DIAGNOSIS — I10 Essential (primary) hypertension: Secondary | ICD-10-CM

## 2023-02-03 ENCOUNTER — Encounter: Payer: Self-pay | Admitting: Family Medicine

## 2023-02-03 ENCOUNTER — Ambulatory Visit: Payer: Managed Care, Other (non HMO) | Admitting: Family Medicine

## 2023-02-03 VITALS — BP 122/60 | HR 78 | Temp 97.8°F | Resp 18 | Ht 60.0 in | Wt 109.0 lb

## 2023-02-03 DIAGNOSIS — D62 Acute posthemorrhagic anemia: Secondary | ICD-10-CM

## 2023-02-03 DIAGNOSIS — C649 Malignant neoplasm of unspecified kidney, except renal pelvis: Secondary | ICD-10-CM

## 2023-02-03 DIAGNOSIS — R7989 Other specified abnormal findings of blood chemistry: Secondary | ICD-10-CM

## 2023-02-03 MED ORDER — ONDANSETRON HCL 4 MG PO TABS: 4.0000 mg | ORAL_TABLET | Freq: Three times a day (TID) | ORAL | 2 refills | Status: AC | PRN

## 2023-02-03 NOTE — Progress Notes (Signed)
[facility] [address] [place], [place] [address] [phone]  Date:  02/03/2023   Name:  Dana Kramer   DOB:  11/29/1965   MRN:  [id number]  PCP:  Pearline Cables, MD    Chief Complaint: Post-op Follow-up (Concerns/ questions: 1. Creatinine level. 2. FMLA for her fatigue)   History of Present Illness:  Dana Kramer is a 57 y.o. very pleasant female patient who presents with the following:  Pt seen today for recheck - history of breast cancer and more recent dx of kidney cancer She recently had surgery for a left RCC-as detailed below  Date of admission: 01/07/2023 Date of discharge: 01/08/2023 Admission diagnosis:  Left renal mass [N28.89] - nephrectomy done 10/31  Path: A. 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 Prognosis is better due to early stage and complete tumor removal. Reports back pain and weight loss, which have improved post-surgery. Discussed recurrence risk and importance of surveillance. -Adjuvant Rande Lawman is not indicated per NCCN guideline - Order CT scan of abdomen and pelvis every six months for the first two to three years, then annually for a total of five years - Include optional CT chest - Coordinate with urologist Dr. Berniece Salines for follow-up and CT scan scheduling - Ensure hydration and avoid NSAIDs - Monitor kidney function with labs every three to six months  Right now [name] is out of work through early December for surgical leave.  Following this she would like to request intermittent FMLA to use when she is feeling particularly tired.  Due to having breast cancer and then this nephrectomy back-to-back  she sometimes feels excessively tired She estimates she might need 1 day out of work per week Patient Active Problem List   Diagnosis Date Noted   Primary renal cell carcinoma of left kidney (HCC) 01/19/2023   Left renal mass 01/07/2023   Port-A-Cath in place 03/04/2021   Genetic testing 01/27/2021   S/P mastectomy, bilateral 01/23/2021   Family history of colon cancer 01/15/2021   Malignant neoplasm of upper-outer quadrant of right breast in female, estrogen receptor positive (HCC) 01/13/2021   Pre-diabetes 11/06/2020   Acquired hypothyroidism 04/14/2018   Migraine 01/18/2015   Insomnia 11/05/2011   Anxiety 11/05/2011   Hyperlipidemia 11/05/2011   Tachycardia 11/05/2011   Umbilical hernia 11/19/2010    Past Medical History:  Diagnosis Date   Allergy 11/1998   Penicillin   Anemia    Chicken pox    History of transfusion of whole blood    Hyperlipidemia    Hypertension    Hypothyroidism    Malignant neoplasm of upper-outer quadrant of right breast in female, estrogen receptor positive (HCC) 01/13/2021   Migraine    Pre-diabetes    Thyroid disease    Hypothyroidism    Past Surgical History:  Procedure Laterality Date   ABDOMINOPLASTY  10/01/2010   CESAREAN SECTION     twice   HERNIA REPAIR     PORT-A-CATH REMOVAL     PORTACATH PLACEMENT Right 01/23/2021   Procedure: Right internal jugular 8 [place] PowerPort with C arm and ultrasound guidance;  Surgeon: Harriette Bouillon, MD;  Location: [facility] OR;  Service: General;  Laterality: Right;   ROBOT ASSISTED LAPAROSCOPIC NEPHRECTOMY Left 01/07/2023   Procedure: XI ROBOTIC ASSISTED LAPAROSCOPIC NEPHRECTOMY WITH TAP BLOCK;  Surgeon: Crist Fat, MD;  Location: [facility] ORS;  Service: Urology;  Laterality: Left;   SENTINEL NODE BIOPSY Right 01/23/2021   Procedure: Right axillary sentinel lymph node mapping using Mag Trace;  Surgeon: Harriette Bouillon, MD;  Location: [facility] OR;  Service: General;  Laterality: Right;   SIMPLE MASTECTOMY  WITH AXILLARY SENTINEL NODE BIOPSY Bilateral 01/23/2021   Procedure: Right simple mastectomy ;  Left risk reducing simple mastectomy;  Surgeon: Harriette Bouillon, MD;  Location: [facility] OR;  Service: General;  Laterality: Bilateral;   TUBAL LIGATION     umblica hernia repair  10/01/2010    Social History   Tobacco Use   Smoking status: Never   Smokeless tobacco: Never  Vaping Use   Vaping status: Never Used  Substance Use Topics   Alcohol use: Never   Drug use: Never    Family History  Problem Relation Age of Onset   Hypertension Mother    Cancer Father 95       esophageal cancer   Leukemia Maternal Grandfather        dx. >50   Colon cancer Paternal Grandfather        dx. >50   Lung cancer Cousin        dx. 1s, did not smoke    Allergies  Allergen Reactions   Betadine [Povidone Iodine] Rash   Penicillins Rash    Mainly upper body only.    Medication list has been reviewed and updated.  Current Outpatient Medications on File Prior to Visit  Medication Sig Dispense Refill   amLODipine (NORVASC) 5 MG tablet Take 1 tablet (5 mg total) by mouth daily. 90 tablet 1   b complex vitamins capsule Take 1 capsule by mouth daily.     Calcium Citrate-Vitamin D (CALCIUM + D PO) Take 1 tablet by mouth daily.     Cholecalciferol (VITAMIN D PO) Take 2,000 Units by mouth daily.     levothyroxine (SYNTHROID) 25 MCG tablet Take 1 tablet (25 mcg total) by mouth daily before breakfast. 90 tablet 1   meclizine (ANTIVERT) 25 MG tablet Take 1 tablet (25 mg total) by mouth 3 (three) times daily as needed for dizziness. 30 tablet 0   melatonin 5 MG TABS Take 5 mg by mouth at bedtime as needed (Sleep).     metoprolol succinate (TOPROL-XL) 50 MG 24 hr tablet TAKE 1 TABLET(50 MG) BY MOUTH DAILY WITH OR IMMEDIATELY FOLLOWING A MEAL 90 tablet 3   Multiple Vitamins-Minerals (WOMENS MULTIVITAMIN PO) Take 1 tablet by mouth daily.     simvastatin (ZOCOR) 20 MG tablet TAKE 1 TABLET(20 MG) BY MOUTH AT BEDTIME  90 tablet 3   tamoxifen (NOLVADEX) 20 MG tablet TAKE 1 TABLET(20 MG) BY MOUTH DAILY 90 tablet 1   traZODone (DESYREL) 50 MG tablet TAKE 1/2 TO 1 TABLET(25 TO 50 MG) BY MOUTH AT BEDTIME AS NEEDED FOR SLEEP 90 tablet 3   No current facility-administered medications on file prior to visit.    Review of Systems:  As per HPI- otherwise negative.   Physical Examination: Vitals:   02/03/23 1601  BP: 122/60  Pulse: 78  Resp: 18  Temp: 97.8 F (36.6 C)  SpO2: 98%   Vitals:   02/03/23 1601  Weight: 109 lb (49.4 kg)  Height: 5' (1.524 m)   Body mass  index is 21.29 kg/m. Ideal Body Weight: Weight in (lb) to have BMI = 25: 127.7  GEN: no acute distress.  Thin, otherwise looks well HEENT: Atraumatic, Normocephalic.  Ears and Nose: No external deformity. CV: RRR, No M/G/R. No JVD. No thrill. No extra heart sounds. PULM: CTA B, no wheezes, crackles, rhonchi. No retractions. No resp. distress. No accessory muscle use. EXTR: No c/c/e PSYCH: Normally interactive. Conversant.    Assessment and Plan: Elevated serum creatinine - Plan: Basic metabolic panel  Renal cell carcinoma, unspecified laterality (HCC)  Acute blood loss anemia - Plan: CBC  Patient seen today for follow-up from recent nephrectomy for renal cell carcinoma.  Overall she is doing well, but notes she is feeling pretty worn out.  She does plan to return to work next month but would like to request intermittent FMLA to use if she is too exhausted to work.  This seems quite reasonable -she has had both breast cancer and now renal cell carcinoma in the last few years  I completed FMLA paperwork which we will fax in for her  We note that her GFR was minimally low at most recent labs, she would like to recheck this when she is in for an infusion next month.  I ordered a BMP as well as a CBC for her Signed Abbe Amsterdam, MD

## 2023-02-03 NOTE — Telephone Encounter (Signed)
Pt has an apt today at 4pm

## 2023-02-08 ENCOUNTER — Encounter: Payer: Self-pay | Admitting: Family Medicine

## 2023-02-15 ENCOUNTER — Other Ambulatory Visit (INDEPENDENT_AMBULATORY_CARE_PROVIDER_SITE_OTHER): Payer: Managed Care, Other (non HMO)

## 2023-02-15 ENCOUNTER — Ambulatory Visit: Payer: Managed Care, Other (non HMO) | Admitting: Family Medicine

## 2023-02-15 DIAGNOSIS — R7989 Other specified abnormal findings of blood chemistry: Secondary | ICD-10-CM

## 2023-02-15 DIAGNOSIS — D62 Acute posthemorrhagic anemia: Secondary | ICD-10-CM

## 2023-02-15 LAB — CBC
HCT: 37.6 % (ref 36.0–46.0)
Hemoglobin: 12.4 g/dL (ref 12.0–15.0)
MCHC: 33 g/dL (ref 30.0–36.0)
MCV: 86.5 fL (ref 78.0–100.0)
Platelets: 223 10*3/uL (ref 150.0–400.0)
RBC: 4.35 Mil/uL (ref 3.87–5.11)
RDW: 12.7 % (ref 11.5–15.5)
WBC: 6 10*3/uL (ref 4.0–10.5)

## 2023-02-15 LAB — BASIC METABOLIC PANEL
BUN: 17 mg/dL (ref 6–23)
CO2: 27 meq/L (ref 19–32)
Calcium: 9.5 mg/dL (ref 8.4–10.5)
Chloride: 100 meq/L (ref 96–112)
Creatinine, Ser: 0.99 mg/dL (ref 0.40–1.20)
GFR: 64.33 mL/min (ref >60.00)
Glucose, Bld: 89 mg/dL (ref 70–99)
Potassium: 4.8 meq/L (ref 3.5–5.1)
Sodium: 137 meq/L (ref 135–145)

## 2023-02-16 ENCOUNTER — Encounter: Payer: Self-pay | Admitting: Family Medicine

## 2023-02-22 ENCOUNTER — Inpatient Hospital Stay: Payer: Managed Care, Other (non HMO) | Attending: Hematology

## 2023-02-22 VITALS — BP 126/80 | HR 82 | Temp 98.1°F | Resp 18

## 2023-02-22 DIAGNOSIS — C50411 Malignant neoplasm of upper-outer quadrant of right female breast: Secondary | ICD-10-CM | POA: Diagnosis present

## 2023-02-22 DIAGNOSIS — M810 Age-related osteoporosis without current pathological fracture: Secondary | ICD-10-CM | POA: Insufficient documentation

## 2023-02-22 DIAGNOSIS — Z95828 Presence of other vascular implants and grafts: Secondary | ICD-10-CM

## 2023-02-22 DIAGNOSIS — Z170 Estrogen receptor positive status [ER+]: Secondary | ICD-10-CM | POA: Diagnosis not present

## 2023-02-22 MED ORDER — SODIUM CHLORIDE 0.9 % IV SOLN: Freq: Once | INTRAVENOUS | Status: AC

## 2023-02-22 MED ORDER — ZOLEDRONIC ACID 4 MG/100ML IV SOLN
4.0000 mg | Freq: Once | INTRAVENOUS | Status: AC
Start: 2023-02-22 — End: 2023-02-22
  Administered 2023-02-22: 4 mg via INTRAVENOUS
  Filled 2023-02-22: qty 100

## 2023-02-22 NOTE — Patient Instructions (Signed)

## 2023-03-04 IMAGING — MG MM DIGITAL SCREENING BILAT W/ TOMO AND CAD
8 series · 9 of 24 positions shown · non-contrast
Comparison: Previous exam(s).

CLINICAL DATA: Screening.

EXAM:
DIGITAL SCREENING BILATERAL MAMMOGRAM WITH TOMOSYNTHESIS AND CAD
TECHNIQUE: Bilateral screening digital craniocaudal and mediolateral oblique
mammograms were obtained. Bilateral screening digital breast
tomosynthesis was performed. The images were evaluated with
computer-aided detection.

[L MLO synth-2D]
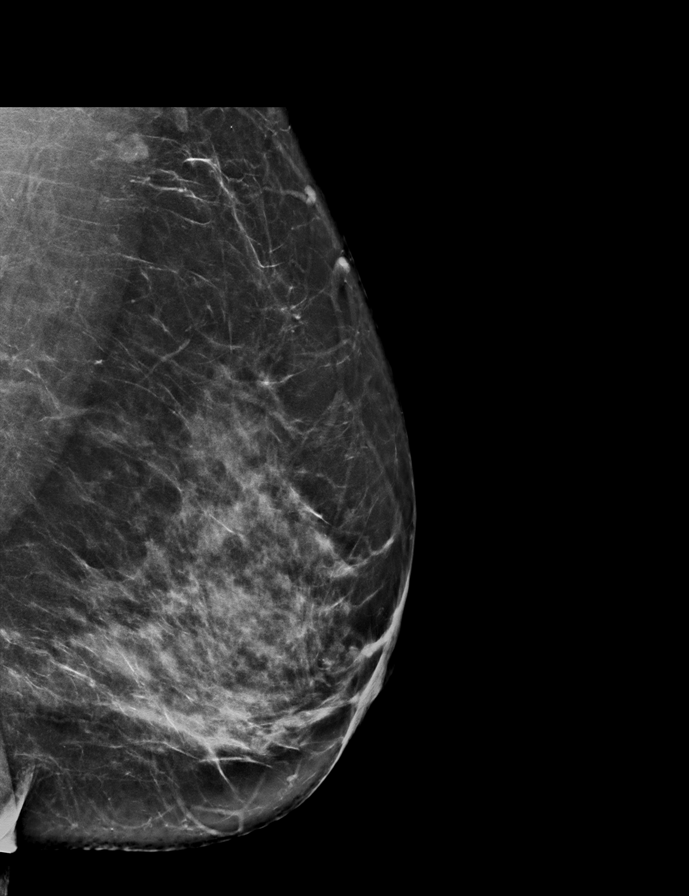

[L CC synth-2D]
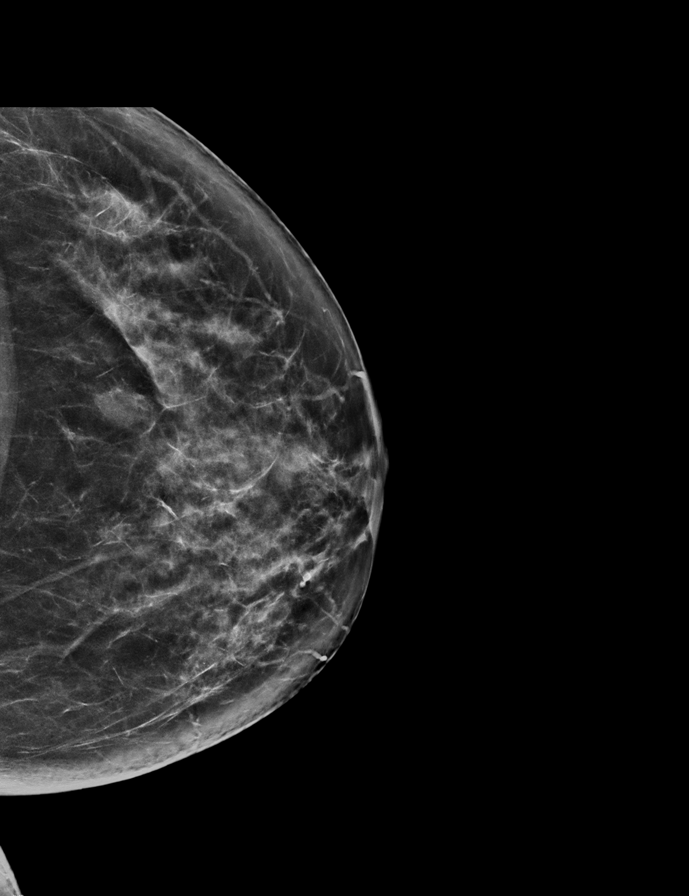

[R CC synth-2D]
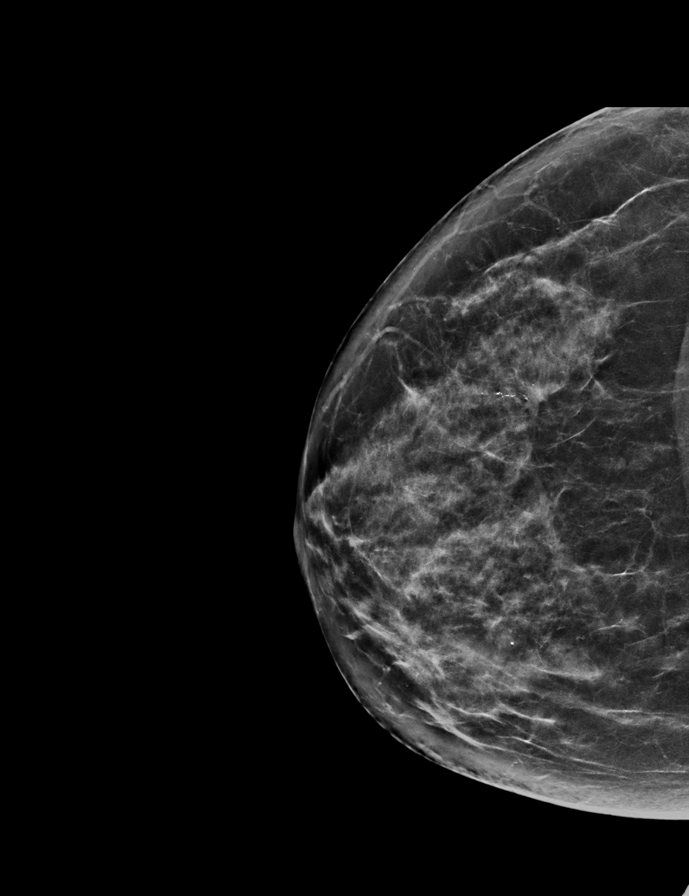

[R MLO synth-2D]
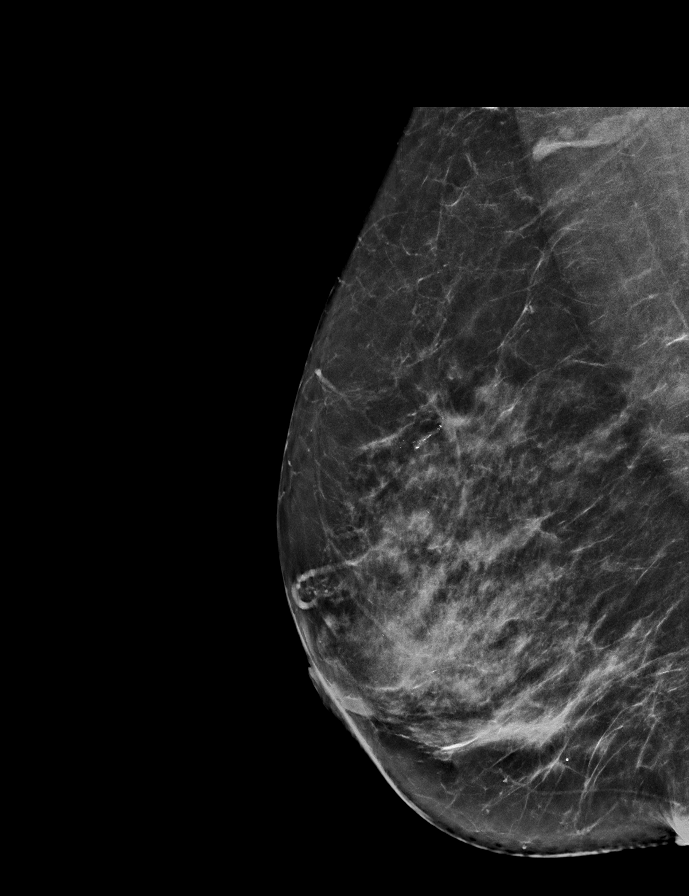

[L CC tomo · 2 of 71 frames shown]
[frame 23/71]
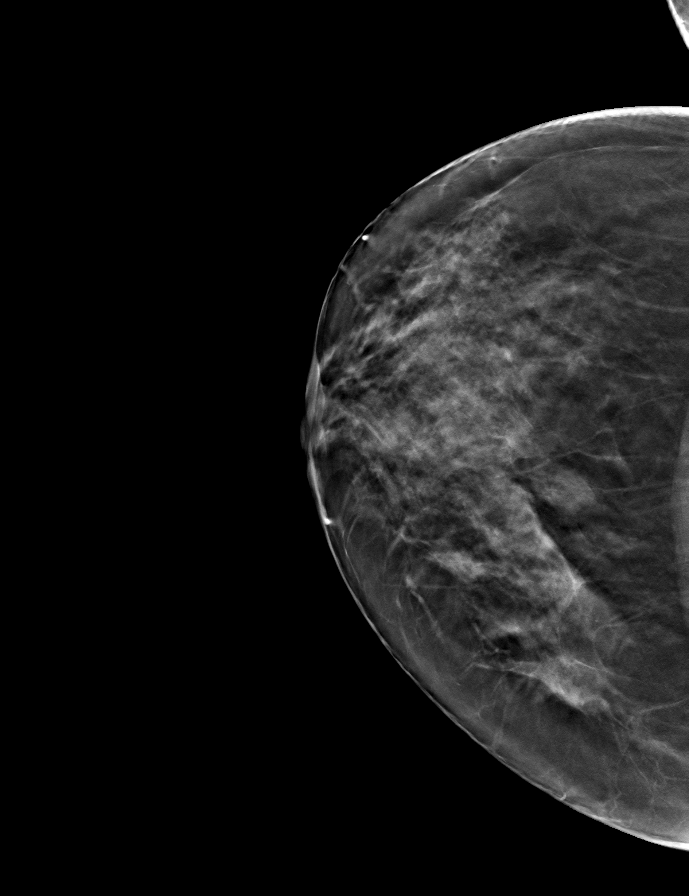
[frame 36/71]
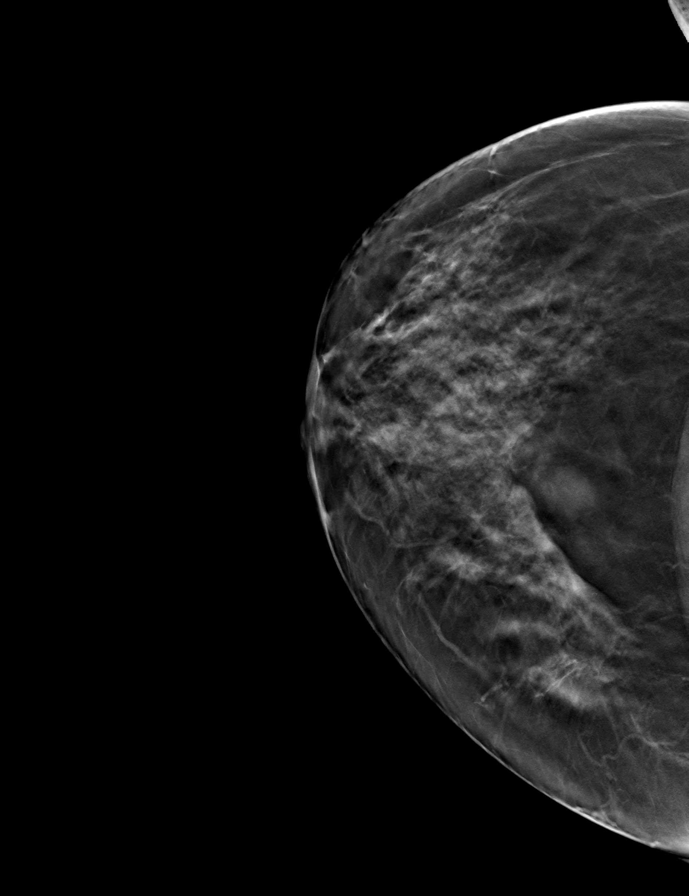

[R MLO tomo · tomo slice 36/71.0]
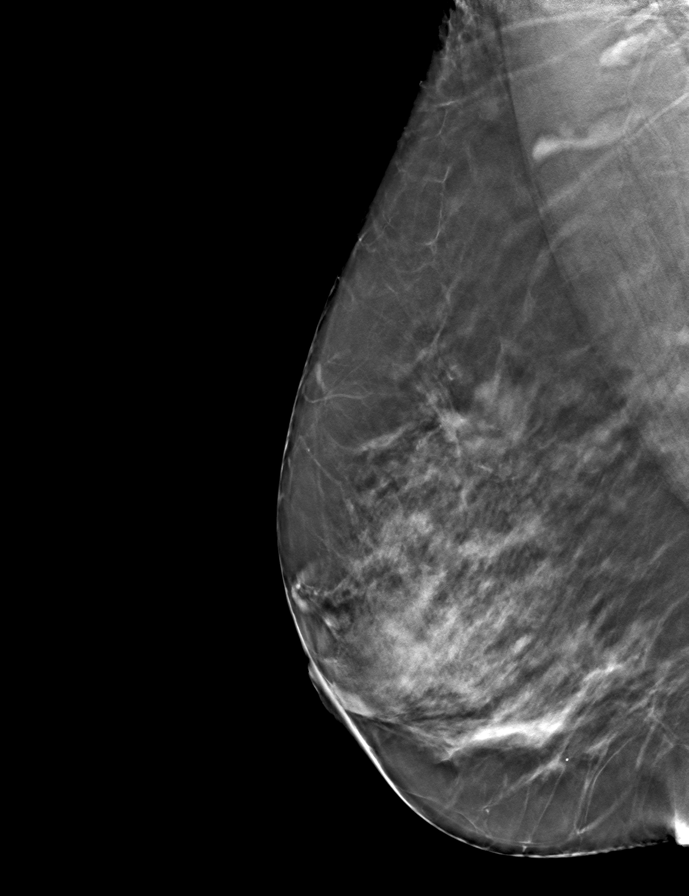

[R CC tomo · tomo slice 36/71.0]
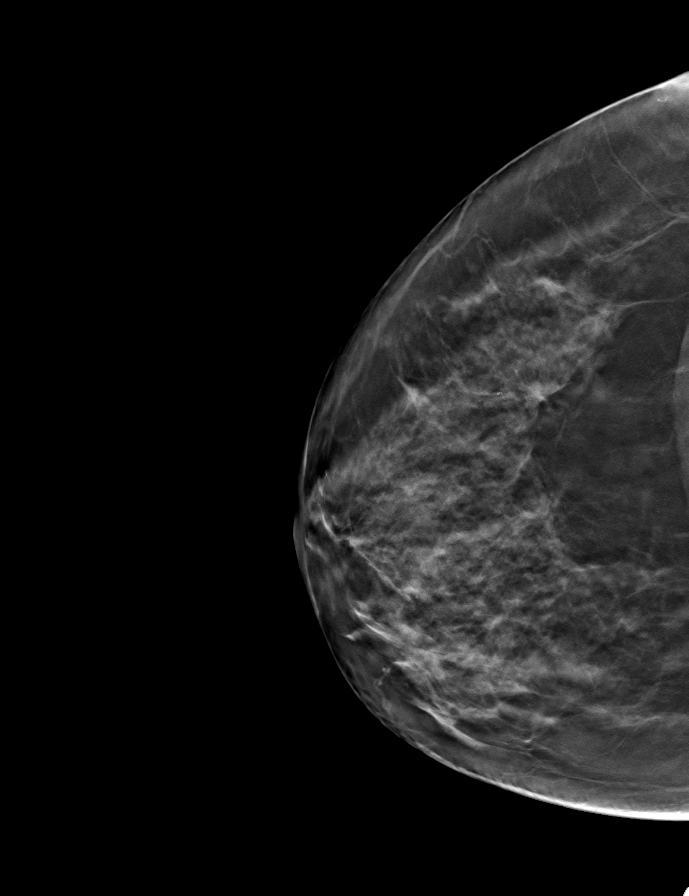

[L MLO tomo · tomo slice 37/73.0]
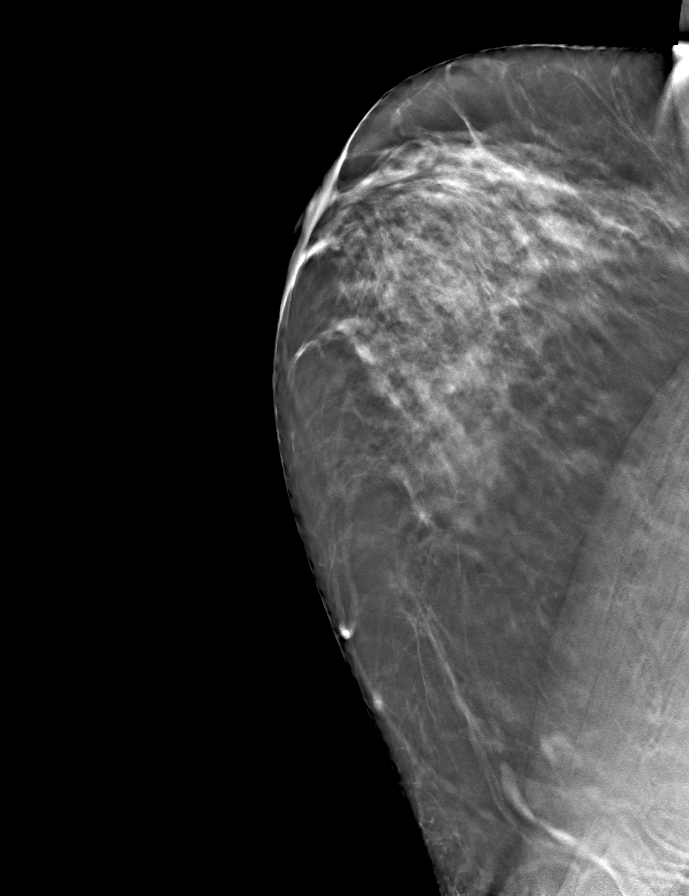

[9 of 24 positions shown; findings below may reference images not displayed]

ACR Breast Density Category c: The breast tissue is heterogeneously
dense, which may obscure small masses.
FINDINGS: In the right breast, calcifications with possible adjacent mass
within the UPPER-OUTER RIGHT breast requires further evaluation.

In the left breast a possible focal asymmetry within the posterior
slightly OUTER LEFT breast requires further evaluation.
IMPRESSION: Further evaluation is suggested for calcifications and possible
adjacent mass in the UPPER-OUTER right breast.

Further evaluation is suggested for possible focal asymmetry in the
posterior slightly OUTER left breast.

RECOMMENDATION:
Diagnostic mammogram and possibly ultrasound of both breasts.
(Code:[id number])

The patient will be contacted regarding the findings, and additional
imaging will be scheduled.

BI-RADS CATEGORY  0: Incomplete. Need additional imaging evaluation
and/or prior mammograms for comparison.

## 2023-04-06 ENCOUNTER — Encounter: Payer: Self-pay | Admitting: Hematology

## 2023-04-07 ENCOUNTER — Other Ambulatory Visit: Payer: Self-pay | Admitting: Nurse Practitioner

## 2023-04-07 DIAGNOSIS — C50411 Malignant neoplasm of upper-outer quadrant of right female breast: Secondary | ICD-10-CM

## 2023-04-12 ENCOUNTER — Encounter: Payer: Self-pay | Admitting: Nutrition

## 2023-04-12 DIAGNOSIS — M810 Age-related osteoporosis without current pathological fracture: Secondary | ICD-10-CM | POA: Insufficient documentation

## 2023-04-12 NOTE — Progress Notes (Signed)
Patient cancelled RD appointment and did not want to reschedule.

## 2023-04-12 NOTE — Assessment & Plan Note (Deleted)
 pT1bN0M0 stage I -s/p left nephostomy 01/07/2023. Surgical path showed grade 3, 6.5 cm clear cell mass.  -no adjuvant therapy needed -continue surveillance with CT scan of abdomen and pelvis every six months for the first two to three years, then annually for a total of five years

## 2023-04-12 NOTE — Assessment & Plan Note (Deleted)
 Stage IA, c(T1bN0M0), Triple positive, Grade 2 -found on screening mammogram. 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 -S/p b/l mastectomies on 01/23/21 with Dr. Luisa Hart. 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 Port was placed during surgery -s/p weekly Abraxane/Herceptin x12 02/25/21 - 05/12/21, -she completed one year maintenance herceptin injection in Jan 2024 -she began tamoxifen on 06/20/21. She is tolerating well overall. She notes she has not had a period since completing chemo. We will check her hormone levels in 1-2 years and we can consider switching her to AI when she became postmenopausal.

## 2023-04-12 NOTE — Assessment & Plan Note (Deleted)
-  bone density scan in 12/2022 showed that BMD measured at [facility] Sp[facility] L1-L4 is 0.801 g/cm2 with a T-score of -3.2 -she started zometa every 6 months on 02/2023

## 2023-04-13 ENCOUNTER — Encounter: Payer: Self-pay | Admitting: Hematology

## 2023-04-13 ENCOUNTER — Inpatient Hospital Stay: Payer: Managed Care, Other (non HMO)

## 2023-04-13 ENCOUNTER — Inpatient Hospital Stay: Payer: Managed Care, Other (non HMO) | Admitting: Hematology

## 2023-04-13 ENCOUNTER — Encounter: Payer: Managed Care, Other (non HMO) | Admitting: Dietician

## 2023-04-13 DIAGNOSIS — Z170 Estrogen receptor positive status [ER+]: Secondary | ICD-10-CM

## 2023-04-13 DIAGNOSIS — C642 Malignant neoplasm of left kidney, except renal pelvis: Secondary | ICD-10-CM

## 2023-04-13 DIAGNOSIS — M816 Localized osteoporosis [Lequesne]: Secondary | ICD-10-CM

## 2023-04-21 ENCOUNTER — Telehealth: Payer: Self-pay | Admitting: Hematology

## 2023-04-21 NOTE — Telephone Encounter (Signed)
Patient is aware of scheduled appointment times/dates

## 2023-04-27 ENCOUNTER — Other Ambulatory Visit: Payer: Self-pay | Admitting: Hematology

## 2023-05-10 ENCOUNTER — Other Ambulatory Visit: Payer: Self-pay | Admitting: Hematology

## 2023-05-10 DIAGNOSIS — Z170 Estrogen receptor positive status [ER+]: Secondary | ICD-10-CM

## 2023-05-10 NOTE — Assessment & Plan Note (Signed)
 Stage IA, c(T1bN0M0), Triple positive, Grade 2 -found on screening mammogram. 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 -S/p b/l mastectomies on 01/23/21 with Dr. Luisa Hart. 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 Port was placed during surgery -s/p weekly Abraxane/Herceptin x12 02/25/21 - 05/12/21, -she completed one year maintenance herceptin injection in Jan 2024 -she began tamoxifen on 06/20/21. She is tolerating well overall. She notes she has not had a period since completing chemo. We will check her hormone levels in 1-2 years and we can consider switching her to AI when she became postmenopausal.

## 2023-05-11 ENCOUNTER — Inpatient Hospital Stay: Payer: Managed Care, Other (non HMO)

## 2023-05-11 ENCOUNTER — Other Ambulatory Visit: Payer: Self-pay

## 2023-05-11 ENCOUNTER — Other Ambulatory Visit: Payer: Managed Care, Other (non HMO)

## 2023-05-11 ENCOUNTER — Inpatient Hospital Stay

## 2023-05-11 ENCOUNTER — Inpatient Hospital Stay: Payer: Managed Care, Other (non HMO) | Attending: Hematology | Admitting: Hematology

## 2023-05-11 DIAGNOSIS — Z170 Estrogen receptor positive status [ER+]: Secondary | ICD-10-CM | POA: Insufficient documentation

## 2023-05-11 DIAGNOSIS — M810 Age-related osteoporosis without current pathological fracture: Secondary | ICD-10-CM | POA: Insufficient documentation

## 2023-05-11 DIAGNOSIS — C50411 Malignant neoplasm of upper-outer quadrant of right female breast: Secondary | ICD-10-CM | POA: Diagnosis present

## 2023-05-11 DIAGNOSIS — Z79810 Long term (current) use of selective estrogen receptor modulators (SERMs): Secondary | ICD-10-CM | POA: Diagnosis not present

## 2023-05-11 DIAGNOSIS — Z9013 Acquired absence of bilateral breasts and nipples: Secondary | ICD-10-CM | POA: Insufficient documentation

## 2023-05-11 LAB — CMP (CANCER CENTER ONLY)
ALT: 9 U/L (ref 0–44)
AST: 21 U/L (ref 15–41)
Albumin: 4.3 g/dL (ref 3.5–5.0)
Alkaline Phosphatase: 42 U/L (ref 38–126)
Anion gap: 7 (ref 5–15)
BUN: 21 mg/dL — ABNORMAL HIGH (ref 6–20)
CO2: 28 mmol/L (ref 22–32)
Calcium: 10.6 mg/dL — ABNORMAL HIGH (ref 8.9–10.3)
Chloride: 103 mmol/L (ref 98–111)
Creatinine: 1.05 mg/dL — ABNORMAL HIGH (ref 0.44–1.00)
GFR, Estimated: >60 mL/min (ref >60)
Glucose, Bld: 100 mg/dL — ABNORMAL HIGH (ref 70–99)
Potassium: 5.1 mmol/L (ref 3.5–5.1)
Sodium: 138 mmol/L (ref 135–145)
Total Bilirubin: 0.3 mg/dL (ref 0.0–1.2)
Total Protein: 7.3 g/dL (ref 6.5–8.1)

## 2023-05-11 LAB — CBC WITH DIFFERENTIAL (CANCER CENTER ONLY)
Abs Immature Granulocytes: 0.02 10*3/uL (ref 0.00–0.07)
Basophils Absolute: 0 10*3/uL (ref 0.0–0.1)
Basophils Relative: 1 %
Eosinophils Absolute: 0.1 10*3/uL (ref 0.0–0.5)
Eosinophils Relative: 3 %
HCT: 36.7 % (ref 36.0–46.0)
Hemoglobin: 12.2 g/dL (ref 12.0–15.0)
Immature Granulocytes: 0 %
Lymphocytes Relative: 45 %
Lymphs Abs: 2.6 10*3/uL (ref 0.7–4.0)
MCH: 28.6 pg (ref 26.0–34.0)
MCHC: 33.2 g/dL (ref 30.0–36.0)
MCV: 86.2 fL (ref 80.0–100.0)
Monocytes Absolute: 0.4 10*3/uL (ref 0.1–1.0)
Monocytes Relative: 7 %
Neutro Abs: 2.4 10*3/uL (ref 1.7–7.7)
Neutrophils Relative %: 44 %
Platelet Count: 180 10*3/uL (ref 150–400)
RBC: 4.26 MIL/uL (ref 3.87–5.11)
RDW: 12.1 % (ref 11.5–15.5)
WBC Count: 5.5 10*3/uL (ref 4.0–10.5)
nRBC: 0 % (ref 0.0–0.2)

## 2023-05-11 MED ORDER — SODIUM CHLORIDE 0.9% FLUSH: 10.0000 mL | Freq: Once | INTRAVENOUS | Status: DC | PRN

## 2023-05-11 NOTE — Progress Notes (Unsigned)
 [facility]   [phone] [phone]   Clinic Follow up Note   Patient Care Team: Copland, Gwenlyn Found, MD as PCP - General (Family Medicine) Harriette Bouillon, MD as Consulting Physician (General Surgery) Malachy Mood, MD as Consulting Physician (Hematology) Lonie Peak, MD as Attending Physician (Radiation Oncology) Pershing Proud, RN as Oncology Nurse Navigator Donnelly Angelica, RN as Oncology Nurse Navigator Bensimhon, Bevelyn Buckles, MD as Consulting Physician (Cardiology) Carlean Jews, NP as Nurse Practitioner (Hematology and Oncology) Crist Fat, MD as Attending Physician (Urology)  Date of Service:  05/11/2023  CHIEF COMPLAINT: f/u of breast and kidney cancer   CURRENT THERAPY:  Tamoxifen   Oncology History   Malignant neoplasm of upper-outer quadrant of right breast in female, estrogen receptor positive (HCC) Stage IA, c(T1bN0M0), Triple positive, Grade 2 -found on screening mammogram. 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 -S/p b/l mastectomies on 01/23/21 with Dr. Luisa Hart. 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 Port was placed during surgery -s/p weekly Abraxane/Herceptin x12 02/25/21 - 05/12/21, -she completed one year maintenance herceptin injection in Jan 2024 -she began tamoxifen on 06/20/21. She is tolerating well overall. She notes she has not had a period since completing chemo. We will check her hormone levels in 1-2 years and we can consider switching her to AI when she became postmenopausal.    Assessment and Plan    Breast Cancer Follow-up for breast cancer. Currently on tamoxifen. Discussed duration of tamoxifen therapy and potential switch to anastrozole if postmenopausal. Risks and benefits of anastrozole versus tamoxifen were reviewed, including blood clots and endometrial cancer with tamoxifen, and joint pain and bone density reduction with anastrozole.  Osteopenia may influence medication choice. Discussed Brax index test at five years to evaluate need for additional anti-estrogen therapy. Patient is concerned about other tumors and open to stronger anti-estrogen therapy if postmenopausal. - Check FSH and estradiol levels to determine menopausal status - Consider switching to anastrozole if postmenopausal - Discuss Brax index test at five-year mark - Continue tamoxifen  Kidney Cancer Post-surgical follow-up. Patient reports fatigue but has recovered well. Last month's CT scan was normal. Plan to monitor with CT scans every six months for two years. -this is managed by her urologist  - Request CT scan results from [facility]  Osteoprosis  Osteopenia with T-score of -3.2. Currently on Zometa. Discussed impact of anti-estrogen therapy on bone density. Calcium level slightly elevated at 10.6 mg/dL. Advised Tylenol for three days post-Zometa infusion to manage side effects. - Continue Zometa every six months - Reduce calcium supplementation to four times a week - Schedule next Zometa infusion for mid-June - Take Tylenol for three days post-Zometa infusion  Musculoskeletal Back Pain Intermittent back pain, likely musculoskeletal, exacerbated by prolonged standing and relieved by rest. Recent CT scan was normal. Pain sometimes leads to nausea but is relieved by rest and heating pad. - Use heating pad for pain relief - Encourage regular exercise to strengthen core muscles  General Health Maintenance Routine health maintenance discussed. No need for mammogram due to double mastectomy. Echocardiogram from last year was normal. - No mammogram needed - No repeat echocardiogram needed  Plan -lab reviewed, she is clinically doing well -continue tamoxifen  - Schedule follow-up appointment in June - Schedule lab and zometa infusion in 3 months, lab include Guardian Reveal blood test in three months.         SUMMARY OF ONCOLOGIC  HISTORY: Oncology History Overview Note   Cancer Staging  Malignant neoplasm of upper-outer quadrant of right breast in female, estrogen receptor positive (HCC) Staging form: Breast, AJCC 8th Edition - Clinical stage from 01/08/2021: Stage IA (cT1b, cN0, cM0, G2, ER+, PR+, HER2+) - Signed by Malachy Mood, MD on 01/15/2021 - Pathologic stage from 01/23/2021: Stage IA (pT1c, pN0, cM0, G2, ER+, PR+, HER2+) - Signed by Malachy Mood, MD on 02/13/2021     Malignant neoplasm of upper-outer quadrant of right breast in female, estrogen receptor positive (HCC)  01/02/2021 Mammogram   EXAM: DIGITAL DIAGNOSTIC BILATERAL MAMMOGRAM WITH TOMOSYNTHESIS AND CAD; ULTRASOUND LEFT BREAST LIMITED; ULTRASOUND RIGHT BREAST LIMITED  IMPRESSION: 1. Highly suspicious 0.7 cm UPPER-OUTER RIGHT breast mass with associated suspicious calcifications extending 1.5 cm anteriorly. Tissue sampling is recommended. 2. No abnormal appearing RIGHT axillary lymph nodes. 3. Benign LOWER LEFT breast cyst corresponding to the LEFT breast screening study finding.   01/08/2021 Cancer Staging   Staging form: Breast, AJCC 8th Edition - Clinical stage from 01/08/2021: Stage IA (cT1b, cN0, cM0, G2, ER+, PR+, HER2+) - Signed by Malachy Mood, MD on 01/15/2021 Stage prefix: Initial diagnosis Nuclear grade: G2 Histologic grading system: 3 grade system   01/08/2021 Pathology Results   Diagnosis Breast, right, needle core biopsy, upper outer quadrant, 11 o'clock, 5cmfn, ribbon clip - INVASIVE DUCTAL CARCINOMA - DUCTAL CARCINOMA IN SITU WITH CALCIFICATIONS - SEE COMMENT Microscopic Comment Based on the biopsy, the carcinoma appears Nottingham grade 2 of 3 and measures 0.6 cm in greatest linear extent.  PROGNOSTIC INDICATORS Results: The tumor cells are 2+ for Her2 (EQUIVOCAL). Her2 by FISH will be performed and results reported separately. Estrogen Receptor: 100%, POSITIVE, STRONG STAINING INTENSITY Progesterone Receptor: 70%, POSITIVE, STRONG  STAINING INTENSITY Proliferation Marker Ki67: 10%  FLUORESCENCE IN-SITU HYBRIDIZATION Results: GROUP 1: HER2 **POSITIVE**   01/13/2021 Initial Diagnosis   Malignant neoplasm of upper-outer quadrant of right breast in female, estrogen receptor positive (HCC)   01/15/2021 Genetic Testing   [facility] CustomNext Panel was Negative. Report date is 01/26/2021.  The CustomNext-Cancer+RNAinsight panel offered by [employer] includes sequencing and rearrangement analysis for the following 47 genes:  APC, ATM, AXIN2, BARD1, BMPR1A, BRCA1, BRCA2, BRIP1, CDH1, CDK4, CDKN2A, CHEK2, CTNNA1, DICER1, EPCAM, GREM1, HOXB13, KIT, MEN1, MLH1, MSH2, MSH3, MSH6, MUTYH, NBN, NF1, NTHL1, PALB2, PDGFRA, PMS2, POLD1, POLE, PTEN, RAD50, RAD51C, RAD51D, SDHA, SDHB, SDHC, SDHD, SMAD4, SMARCA4, STK11, TP53, TSC1, TSC2, and VHL.  RNA data is routinely analyzed for use in variant interpretation for all genes.   01/23/2021 Cancer Staging   Staging form: Breast, AJCC 8th Edition - Pathologic stage from 01/23/2021: Stage IA (pT1c, pN0, cM0, G2, ER+, PR+, HER2+) - Signed by Malachy Mood, MD on 02/13/2021 Histologic grading system: 3 grade system Residual tumor (R): R0 - None   01/23/2021 Definitive Surgery   FINAL MICROSCOPIC DIAGNOSIS:   A. BREAST, LEFT, MASTECTOMY:  - Fibrocystic changes with sclerosing adenosis and calcifications.  - Fibroadenoma (0.6 cm).  - No evidence of malignancy.   B. BREAST, RIGHT, MASTECTOMY:  - Invasive and in situ ductal carcinoma, 1.7 cm.  - Margins negative for carcinoma.  - Biopsy site and biopsy clip.  - See oncology table.   C. LYMPH NODE, RIGHT AXILLARY, SENTINEL, EXCISION:  - One lymph node negative for metastatic carcinoma (0/1).   D. LYMPH NODE, RIGHT AXILLARY, SENTINEL, EXCISION:  - One lymph node negative for metastatic carcinoma (0/1).   E. LYMPH NODE, RIGHT AXILLARY, SENTINEL, EXCISION:  - One lymph  node negative for metastatic carcinoma (0/1).   F. LYMPH NODE, RIGHT  AXILLARY, SENTINEL, EXCISION:  - One lymph node negative for metastatic carcinoma (0/1).    02/25/2021 - 10/22/2021 Chemotherapy   Patient is on Treatment Plan : BREAST Paclitaxel + Trastuzumab q7d / Trastuzumab q21d     02/25/2021 - 04/08/2022 Chemotherapy   Patient is on Treatment Plan : BREAST Paclitaxel + Trastuzumab q7d / Trastuzumab q21d     Primary renal cell carcinoma of left kidney (HCC)  01/07/2023 Cancer Staging   Staging form: Kidney, AJCC 8th Edition - Pathologic stage from 01/07/2023: Stage I (pT1b, pN0, cM0) - Signed by Malachy Mood, MD on 01/19/2023 Histologic grade (G): G3 Histologic grading system: 4 grade system Residual tumor (R): R0 - None   01/19/2023 Initial Diagnosis   Primary renal cell carcinoma of left kidney (HCC)      Discussed the use of AI scribe software for clinical note transcription with the patient, who gave verbal consent to proceed.  History of Present Illness   The patient, a 58 year old female with a history of breast and kidney cancer, presents for a follow-up visit. She reports that her recent CT scan at [facility] showed good results, but also indicated signs of emphysema. The patient denies any history of smoking and attributes the emphysema to a recent bout of flu. She denies any respiratory symptoms.  The patient is currently on tamoxifen for breast cancer and has had a recent Zometa injection. She inquires about the duration of tamoxifen therapy and expresses willingness to switch to a stronger anti-estrogen therapy if she is postmenopausal.  The patient also reports experiencing back pain, which she attributes to standing for long periods. The pain is located in the middle of her back and radiates outwards. It is severe enough to cause nausea, but it is not a daily occurrence and tends to subside when she is resting.  The patient also has osteoporosis and is taking calcium supplements. However, her recent lab results showed slightly  elevated calcium levels, and she has been advised to reduce her calcium intake.         All other systems were reviewed with the patient and are negative.  MEDICAL HISTORY:  Past Medical History:  Diagnosis Date   Allergy 11/1998   Penicillin   Anemia    Chicken pox    History of transfusion of whole blood    Hyperlipidemia    Hypertension    Hypothyroidism    Malignant neoplasm of upper-outer quadrant of right breast in female, estrogen receptor positive (HCC) 01/13/2021   Migraine    Pre-diabetes    Thyroid disease    Hypothyroidism    SURGICAL HISTORY: Past Surgical History:  Procedure Laterality Date   ABDOMINOPLASTY  10/01/2010   CESAREAN SECTION     twice   HERNIA REPAIR     PORT-A-CATH REMOVAL     PORTACATH PLACEMENT Right 01/23/2021   Procedure: Right internal jugular 8 [place] PowerPort with C arm and ultrasound guidance;  Surgeon: Harriette Bouillon, MD;  Location: [facility] OR;  Service: General;  Laterality: Right;   ROBOT ASSISTED LAPAROSCOPIC NEPHRECTOMY Left 01/07/2023   Procedure: XI ROBOTIC ASSISTED LAPAROSCOPIC NEPHRECTOMY WITH TAP BLOCK;  Surgeon: Crist Fat, MD;  Location: [facility];  Service: Urology;  Laterality: Left;   SENTINEL NODE BIOPSY Right 01/23/2021   Procedure: Right axillary sentinel lymph node mapping using Mag Trace;  Surgeon: Harriette Bouillon, MD;  Location: [facility] OR;  Service: General;  Laterality:  Right;   SIMPLE MASTECTOMY WITH AXILLARY SENTINEL NODE BIOPSY Bilateral 01/23/2021   Procedure: Right simple mastectomy ;  Left risk reducing simple mastectomy;  Surgeon: Harriette Bouillon, MD;  Location: [facility] OR;  Service: General;  Laterality: Bilateral;   TUBAL LIGATION     umblica hernia repair  10/01/2010    I have reviewed the social history and family history with the patient and they are unchanged from previous note.  ALLERGIES:  is allergic to betadine [povidone iodine] and penicillins.  MEDICATIONS:  Current Outpatient Medications   Medication Sig Dispense Refill   amLODipine (NORVASC) 5 MG tablet Take 1 tablet (5 mg total) by mouth daily. 90 tablet 1   b complex vitamins capsule Take 1 capsule by mouth daily.     Calcium Citrate-Vitamin D (CALCIUM + D PO) Take 1 tablet by mouth daily.     Cholecalciferol (VITAMIN D PO) Take 2,000 Units by mouth daily.     levothyroxine (SYNTHROID) 25 MCG tablet Take 1 tablet (25 mcg total) by mouth daily before breakfast. 90 tablet 1   melatonin 5 MG TABS Take 5 mg by mouth at bedtime as needed (Sleep).     metoprolol succinate (TOPROL-XL) 50 MG 24 hr tablet TAKE 1 TABLET(50 MG) BY MOUTH DAILY WITH OR IMMEDIATELY FOLLOWING A MEAL 90 tablet 3   Multiple Vitamins-Minerals (WOMENS MULTIVITAMIN PO) Take 1 tablet by mouth daily.     ondansetron (ZOFRAN) 4 MG tablet Take 1 tablet (4 mg total) by mouth every 8 (eight) hours as needed for nausea or vomiting. 30 tablet 2   simvastatin (ZOCOR) 20 MG tablet TAKE 1 TABLET(20 MG) BY MOUTH AT BEDTIME 90 tablet 3   tamoxifen (NOLVADEX) 20 MG tablet TAKE 1 TABLET(20 MG) BY MOUTH DAILY 90 tablet 1   traZODone (DESYREL) 50 MG tablet TAKE 1/2 TO 1 TABLET(25 TO 50 MG) BY MOUTH AT BEDTIME AS NEEDED FOR SLEEP 90 tablet 3   meclizine (ANTIVERT) 25 MG tablet Take 1 tablet (25 mg total) by mouth 3 (three) times daily as needed for dizziness. 30 tablet 0   No current facility-administered medications for this visit.    PHYSICAL EXAMINATION: ECOG PERFORMANCE STATUS: 0 - Asymptomatic  There were no vitals filed for this visit. Wt Readings from Last 3 Encounters:  02/03/23 109 lb (49.4 kg)  01/19/23 109 lb 3.2 oz (49.5 kg)  01/07/23 110 lb (49.9 kg)     GENERAL:alert, no distress and comfortable SKIN: skin color, texture, turgor are normal, no rashes or significant lesions EYES: normal, Conjunctiva are pink and non-injected, sclera clear NECK: supple, thyroid normal size, non-tender, without nodularity LYMPH:  no palpable lymphadenopathy in the cervical,  axillary  LUNGS: clear to auscultation and percussion with normal breathing effort HEART: regular rate & rhythm and no murmurs and no lower extremity edema ABDOMEN:abdomen soft, non-tender and normal bowel sounds Musculoskeletal:no cyanosis of digits and no clubbing  NEURO: alert & oriented x 3 with fluent speech, no focal motor/sensory deficits Breast: s/p bilateral mastectomy, no palpable nodule or skin change on chest wall, no adenopathy    LABORATORY DATA:  I have reviewed the data as listed    Latest Ref Rng & Units 05/11/2023    2:10 PM 02/15/2023    9:13 AM 01/19/2023   12:27 PM  CBC  WBC 4.0 - 10.5 K/uL 5.5  6.0  6.8   Hemoglobin 12.0 - 15.0 g/dL 11.9  14.7  82.9   Hematocrit 36.0 - 46.0 % 36.7  37.6  34.8   Platelets 150 - 400 K/uL 180  223.0  323         Latest Ref Rng & Units 05/11/2023    2:10 PM 02/15/2023    9:13 AM 01/19/2023   12:27 PM  CMP  Glucose 70 - 99 mg/dL 161  89  096   BUN 6 - 20 mg/dL 21  17  15    Creatinine 0.44 - 1.00 mg/dL 0.45  4.09  8.11   Sodium 135 - 145 mmol/L 138  137  136   Potassium 3.5 - 5.1 mmol/L 5.1  4.8  4.2   Chloride 98 - 111 mmol/L 103  100  103   CO2 22 - 32 mmol/L 28  27  26    Calcium 8.9 - 10.3 mg/dL 91.4  9.5  9.5   Total Protein 6.5 - 8.1 g/dL 7.3   7.2   Total Bilirubin 0.0 - 1.2 mg/dL 0.3   0.3   Alkaline Phos 38 - 126 U/L 42   52   AST 15 - 41 U/L 21   17   ALT 0 - 44 U/L 9   6       RADIOGRAPHIC STUDIES: I have personally reviewed the radiological images as listed and agreed with the findings in the report. No results found.    Orders Placed This Encounter  Procedures   Guardant Reveal    Standing Status:   Future    Expected Date:   08/23/2023    Expiration Date:   05/10/2024   All questions were answered. The patient knows to call the clinic with any problems, questions or concerns. No barriers to learning was detected. The total time spent in the appointment was 25 minutes.     Malachy Mood, MD 05/11/2023

## 2023-05-11 NOTE — Progress Notes (Unsigned)
 Verbal order with readback from Dr. Mosetta Putt for Guardant Reveal Q47months w/1st to be drawn in June 2025.  Order placed and submitted online.  Order requisition paperwork given to [facility] in Med-Onc Lab.

## 2023-05-12 LAB — ESTRADIOL: Estradiol: 5.8 pg/mL

## 2023-05-12 LAB — FOLLICLE STIMULATING HORMONE: FSH: 37.9 m[IU]/mL

## 2023-05-13 ENCOUNTER — Encounter: Payer: Self-pay | Admitting: Hematology

## 2023-05-13 ENCOUNTER — Telehealth: Payer: Self-pay

## 2023-05-13 NOTE — Telephone Encounter (Signed)
 Reached out to [facility] via telephone call, per Dr. Mosetta Putt. Requested patient's recent CT report. Provided [facility] Medical Records Rep with our fax #. [facility] Rep faxed CT Report as requested to fax # [phone].

## 2023-05-13 NOTE — Telephone Encounter (Signed)
-----   Message from Malachy Mood sent at 05/13/2023  7:50 AM EST ----- Please request her recent CT report from [facility], thx

## 2023-05-14 ENCOUNTER — Encounter: Payer: Self-pay | Admitting: Adult Health

## 2023-05-14 ENCOUNTER — Encounter: Payer: Self-pay | Admitting: Hematology

## 2023-05-27 ENCOUNTER — Other Ambulatory Visit: Payer: Managed Care, Other (non HMO)

## 2023-05-31 ENCOUNTER — Encounter: Payer: Self-pay | Admitting: Hematology

## 2023-06-04 NOTE — Progress Notes (Addendum)
 [facility] [address] [place], [place] [address] [phone]  Date:  06/07/2023   Name:  Dana Kramer   DOB:  [birth year]   MRN:  [id number]  PCP:  Pearline Cables, MD    Chief Complaint: Follow-up (No concerns )   History of Present Illness:  Dana Kramer is a 58 y.o. very pleasant female patient who presents with the following:  Pt seen today for periodic recheck visit - last seen by myself in November.  History of breast cancer and more recent dx of kidney cancer s/p nephrectomy in October 2024  Seen by Dr Mosetta Putt 05/11/23-  Breast Cancer Follow-up for breast cancer. Currently on tamoxifen. Discussed duration of tamoxifen therapy and potential switch to anastrozole if postmenopausal. Risks and benefits of anastrozole versus tamoxifen were reviewed, including blood clots and endometrial cancer with tamoxifen, and joint pain and bone density reduction with anastrozole. Osteopenia may influence medication choice. Discussed Brax index test at five years to evaluate need for additional anti-estrogen therapy. Patient is concerned about other tumors and open to stronger anti-estrogen therapy if postmenopausal. - Check FSH and estradiol levels to determine menopausal status - Consider switching to anastrozole if postmenopausal - Discuss Brax index test at five-year mark - Continue tamoxifen Kidney Cancer Post-surgical follow-up. Patient reports fatigue but has recovered well. Last month's CT scan was normal. Plan to monitor with CT scans every six months for two years. -this is managed by her urologist  - Request CT scan results from [facility] Osteoprosis  Osteopenia with T-score of -3.2. Currently on Zometa. Discussed impact of anti-estrogen therapy on bone density. Calcium level slightly elevated at 10.6 mg/dL. Advised Tylenol for three days post-Zometa infusion to manage side effects. - Continue Zometa every six months -  Reduce calcium supplementation to four times a week - Schedule next Zometa infusion for mid-June - Take Tylenol for three days post-Zometa infusion Musculoskeletal Back Pain Intermittent back pain, likely musculoskeletal, exacerbated by prolonged standing and relieved by rest. Recent CT scan was normal. Pain sometimes leads to nausea but is relieved by rest and heating pad. - Use heating pad for pain relief - Encourage regular exercise to strengthen core muscles   Amlodipine Levothyroxine Toprol xl Simvastatin Tamoixifen Trazodone  CMP done in March  She sees Dr Marlou Porch at [facility] - they did a CT for her in February   She does not check her BP generally She worked the overnight shift last night which may be why her BP is a bit high   She is back exercising at the gym  Lab Results  Component Value Date   TSH 3.23 12/03/2022    Patient Active Problem List   Diagnosis Date Noted   Osteoporosis 04/12/2023   Primary renal cell carcinoma of left kidney (HCC) 01/19/2023   Left renal mass 01/07/2023   Port-A-Cath in place 03/04/2021   Genetic testing 01/27/2021   S/P mastectomy, bilateral 01/23/2021   Family history of colon cancer 01/15/2021   Malignant neoplasm of upper-outer quadrant of right breast in female, estrogen receptor positive (HCC) 01/13/2021   Pre-diabetes 11/06/2020   Acquired hypothyroidism 04/14/2018   Migraine 01/18/2015   Insomnia 11/05/2011   Anxiety 11/05/2011   Hyperlipidemia 11/05/2011   Tachycardia 11/05/2011   Umbilical hernia 11/19/2010    Past Medical History:  Diagnosis Date   Allergy 11/1998   Penicillin   Anemia    Chicken pox  History of transfusion of whole blood    Hyperlipidemia    Hypertension    Hypothyroidism    Malignant neoplasm of upper-outer quadrant of right breast in female, estrogen receptor positive (HCC) 01/13/2021   Migraine    Pre-diabetes    Thyroid disease    Hypothyroidism    Past Surgical History:   Procedure Laterality Date   ABDOMINOPLASTY  10/01/2010   CESAREAN SECTION     twice   HERNIA REPAIR     PORT-A-CATH REMOVAL     PORTACATH PLACEMENT Right 01/23/2021   Procedure: Right internal jugular 8 [place] PowerPort with C arm and ultrasound guidance;  Surgeon: Harriette Bouillon, MD;  Location: [facility];  Service: General;  Laterality: Right;   ROBOT ASSISTED LAPAROSCOPIC NEPHRECTOMY Left 01/07/2023   Procedure: XI ROBOTIC ASSISTED LAPAROSCOPIC NEPHRECTOMY WITH TAP BLOCK;  Surgeon: Crist Fat, MD;  Location: [facility];  Service: Urology;  Laterality: Left;   SENTINEL NODE BIOPSY Right 01/23/2021   Procedure: Right axillary sentinel lymph node mapping using Mag Trace;  Surgeon: Harriette Bouillon, MD;  Location: [facility] OR;  Service: General;  Laterality: Right;   SIMPLE MASTECTOMY WITH AXILLARY SENTINEL NODE BIOPSY Bilateral 01/23/2021   Procedure: Right simple mastectomy ;  Left risk reducing simple mastectomy;  Surgeon: Harriette Bouillon, MD;  Location: [facility];  Service: General;  Laterality: Bilateral;   TUBAL LIGATION     umblica hernia repair  10/01/2010    Social History   Tobacco Use   Smoking status: Never   Smokeless tobacco: Never  Vaping Use   Vaping status: Never Used  Substance Use Topics   Alcohol use: Never   Drug use: Never    Family History  Problem Relation Age of Onset   Hypertension Mother    Cancer Father 46       esophageal cancer   Leukemia Maternal Grandfather        dx. >50   Colon cancer Paternal Grandfather        dx. >50   Lung cancer Cousin        dx. 20s, did not smoke    Allergies  Allergen Reactions   Betadine [Povidone Iodine] Rash   Penicillins Rash    Mainly upper body only.    Medication list has been reviewed and updated.  Current Outpatient Medications on File Prior to Visit  Medication Sig Dispense Refill   b complex vitamins capsule Take 1 capsule by mouth daily.     Calcium Citrate-Vitamin D (CALCIUM + D PO) Take 1 tablet by  mouth daily.     Cholecalciferol (VITAMIN D PO) Take 2,000 Units by mouth daily.     melatonin 5 MG TABS Take 5 mg by mouth at bedtime as needed (Sleep).     metoprolol succinate (TOPROL-XL) 50 MG 24 hr tablet TAKE 1 TABLET(50 MG) BY MOUTH DAILY WITH OR IMMEDIATELY FOLLOWING A MEAL 90 tablet 3   Multiple Vitamins-Minerals (WOMENS MULTIVITAMIN PO) Take 1 tablet by mouth daily.     ondansetron (ZOFRAN) 4 MG tablet Take 1 tablet (4 mg total) by mouth every 8 (eight) hours as needed for nausea or vomiting. 30 tablet 2   tamoxifen (NOLVADEX) 20 MG tablet TAKE 1 TABLET(20 MG) BY MOUTH DAILY 90 tablet 1   meclizine (ANTIVERT) 25 MG tablet Take 1 tablet (25 mg total) by mouth 3 (three) times daily as needed for dizziness. 30 tablet 0   No current facility-administered medications on file prior to visit.  Review of Systems:  As per HPI- otherwise negative.   Physical Examination: Vitals:   06/07/23 0808 06/07/23 0825  BP: (!) 140/90 (!) 135/90  Pulse: 69   SpO2: 100%    Vitals:   06/07/23 0808  Weight: 116 lb 9.6 oz (52.9 kg)  Height: 5' (1.524 m)   Body mass index is 22.77 kg/m. Ideal Body Weight: Weight in (lb) to have BMI = 25: 127.7  GEN: no acute distress.  Petite build, normal weight.  Looks well HEENT: Atraumatic, Normocephalic.  Bilateral TM wnl, oropharynx normal.  PEERL,EOMI.   Ears and Nose: No external deformity. CV: RRR, No M/G/R. No JVD. No thrill. No extra heart sounds. PULM: CTA B, no wheezes, crackles, rhonchi. No retractions. No resp. distress. No accessory muscle use. ABD: S, NT, ND. No rebound. No HSM. EXTR: No c/c/e PSYCH: Normally interactive. Conversant.    Assessment and Plan: Elevated serum creatinine - Plan: Basic metabolic panel with GFR  Renal cell carcinoma, unspecified laterality (HCC)  Acquired hypothyroidism - Plan: TSH, levothyroxine (SYNTHROID) 25 MCG tablet  Prediabetes - Plan: Hemoglobin A1c  Essential hypertension - Plan: amLODipine  (NORVASC) 5 MG tablet  Mixed hyperlipidemia - Plan: Lipid panel  Vitamin D deficiency - Plan: VITAMIN D 25 Hydroxy (Vit-D Deficiency, Fractures)  Dyslipidemia - Plan: simvastatin (ZOCOR) 20 MG tablet  Adjustment insomnia - Plan: traZODone (DESYREL) 50 MG tablet Patient seen today for follow-up Unfortunately has dealt with for breast and kidney cancer status post nephrectomy-both are currently under control  Follow-up on A1c, thyroid, lipid, vitamin D We note her blood pressure is mildly elevated but she just came from working a night shift She is taking simvastatin for cholesterol Will plan further follow- up pending labs.   Signed Abbe Amsterdam, MD  Received labs as below- message to pt  Results for orders placed or performed in visit on 06/07/23  Basic metabolic panel with GFR   Collection Time: 06/07/23  8:28 AM  Result Value Ref Range   Sodium 141 135 - 145 mEq/L   Potassium 4.1 3.5 - 5.1 mEq/L   Chloride 105 96 - 112 mEq/L   CO2 26 19 - 32 mEq/L   Glucose, Bld 81 70 - 99 mg/dL   BUN 11 6 - 23 mg/dL   Creatinine, Ser 1.09 0.40 - 1.20 mg/dL   GFR 60.45 >40.98 mL/min   Calcium 9.3 8.4 - 10.5 mg/dL  TSH   Collection Time: 06/07/23  8:28 AM  Result Value Ref Range   TSH 5.85 (H) 0.35 - 5.50 uIU/mL  Hemoglobin A1c   Collection Time: 06/07/23  8:28 AM  Result Value Ref Range   Hgb A1c MFr Bld 5.9 4.6 - 6.5 %  Lipid panel   Collection Time: 06/07/23  8:28 AM  Result Value Ref Range   Cholesterol 175 0 - 200 mg/dL   Triglycerides 119.1 (H) 0.0 - 149.0 mg/dL   HDL 47.82 >95.62 mg/dL   VLDL 13.0 (H) 0.0 - 86.5 mg/dL   LDL Cholesterol 83 0 - 99 mg/dL   Total CHOL/HDL Ratio 3    NonHDL 124.52   VITAMIN D 25 Hydroxy (Vit-D Deficiency, Fractures)   Collection Time: 06/07/23  8:28 AM  Result Value Ref Range   VITD 62.97 30.00 - 100.00 ng/mL

## 2023-06-04 NOTE — Patient Instructions (Addendum)
 Good to see you again today!   I will be in touch with your labs Wait until you hear from me to pick up the thyroid med just in case we need to make any change Please see me in about 6 months assuming all is well Happy spring to you!

## 2023-06-07 ENCOUNTER — Encounter: Payer: Self-pay | Admitting: Family Medicine

## 2023-06-07 ENCOUNTER — Ambulatory Visit: Payer: Managed Care, Other (non HMO) | Admitting: Family Medicine

## 2023-06-07 VITALS — BP 135/90 | HR 69 | Ht 60.0 in | Wt 116.6 lb

## 2023-06-07 DIAGNOSIS — R7303 Prediabetes: Secondary | ICD-10-CM

## 2023-06-07 DIAGNOSIS — E782 Mixed hyperlipidemia: Secondary | ICD-10-CM | POA: Diagnosis not present

## 2023-06-07 DIAGNOSIS — E559 Vitamin D deficiency, unspecified: Secondary | ICD-10-CM | POA: Diagnosis not present

## 2023-06-07 DIAGNOSIS — C649 Malignant neoplasm of unspecified kidney, except renal pelvis: Secondary | ICD-10-CM

## 2023-06-07 DIAGNOSIS — E039 Hypothyroidism, unspecified: Secondary | ICD-10-CM

## 2023-06-07 DIAGNOSIS — F5102 Adjustment insomnia: Secondary | ICD-10-CM

## 2023-06-07 DIAGNOSIS — R7989 Other specified abnormal findings of blood chemistry: Secondary | ICD-10-CM

## 2023-06-07 DIAGNOSIS — E785 Hyperlipidemia, unspecified: Secondary | ICD-10-CM

## 2023-06-07 DIAGNOSIS — I10 Essential (primary) hypertension: Secondary | ICD-10-CM

## 2023-06-07 LAB — BASIC METABOLIC PANEL WITH GFR
BUN: 11 mg/dL (ref 6–23)
CO2: 26 meq/L (ref 19–32)
Calcium: 9.3 mg/dL (ref 8.4–10.5)
Chloride: 105 meq/L (ref 96–112)
Creatinine, Ser: 0.95 mg/dL (ref 0.40–1.20)
GFR: 67.44 mL/min (ref >60.00)
Glucose, Bld: 81 mg/dL (ref 70–99)
Potassium: 4.1 meq/L (ref 3.5–5.1)
Sodium: 141 meq/L (ref 135–145)

## 2023-06-07 LAB — LIPID PANEL
Cholesterol: 175 mg/dL (ref 0–200)
HDL: 50.4 mg/dL (ref >39.00)
LDL Cholesterol: 83 mg/dL (ref 0–99)
NonHDL: 124.52
Total CHOL/HDL Ratio: 3
Triglycerides: 208 mg/dL — ABNORMAL HIGH (ref 0.0–149.0)
VLDL: 41.6 mg/dL — ABNORMAL HIGH (ref 0.0–40.0)

## 2023-06-07 LAB — VITAMIN D 25 HYDROXY (VIT D DEFICIENCY, FRACTURES): VITD: 62.97 ng/mL (ref 30.00–100.00)

## 2023-06-07 LAB — HEMOGLOBIN A1C: Hgb A1c MFr Bld: 5.9 % (ref 4.6–6.5)

## 2023-06-07 LAB — TSH: TSH: 5.85 u[IU]/mL — ABNORMAL HIGH (ref 0.35–5.50)

## 2023-06-07 MED ORDER — TRAZODONE HCL 50 MG PO TABS: ORAL_TABLET | ORAL | 3 refills | Status: DC

## 2023-06-07 MED ORDER — LEVOTHYROXINE SODIUM 50 MCG PO TABS
50.0000 ug | ORAL_TABLET | Freq: Every day | ORAL | 3 refills | Status: DC
Start: 2023-06-07 — End: 2023-07-12

## 2023-06-07 MED ORDER — AMLODIPINE BESYLATE 5 MG PO TABS: 5.0000 mg | ORAL_TABLET | Freq: Every day | ORAL | 3 refills | Status: DC

## 2023-06-07 MED ORDER — SIMVASTATIN 20 MG PO TABS: 20.0000 mg | ORAL_TABLET | Freq: Every day | ORAL | 3 refills | Status: DC

## 2023-06-07 MED ORDER — LEVOTHYROXINE SODIUM 25 MCG PO TABS: 25.0000 ug | ORAL_TABLET | Freq: Every day | ORAL | 3 refills | Status: DC

## 2023-07-12 ENCOUNTER — Other Ambulatory Visit (INDEPENDENT_AMBULATORY_CARE_PROVIDER_SITE_OTHER)

## 2023-07-12 ENCOUNTER — Encounter: Payer: Self-pay | Admitting: Family Medicine

## 2023-07-12 DIAGNOSIS — E039 Hypothyroidism, unspecified: Secondary | ICD-10-CM | POA: Diagnosis not present

## 2023-07-12 LAB — TSH: TSH: 4.08 u[IU]/mL (ref 0.35–5.50)

## 2023-07-12 MED ORDER — LEVOTHYROXINE SODIUM 50 MCG PO TABS: 50.0000 ug | ORAL_TABLET | Freq: Every day | ORAL | 3 refills | Status: AC

## 2023-07-19 ENCOUNTER — Ambulatory Visit: Payer: Self-pay | Attending: Surgery

## 2023-07-19 VITALS — Wt 121.4 lb

## 2023-07-19 DIAGNOSIS — Z483 Aftercare following surgery for neoplasm: Secondary | ICD-10-CM | POA: Insufficient documentation

## 2023-07-19 NOTE — Therapy (Signed)
 OUTPATIENT PHYSICAL THERAPY SOZO SCREENING NOTE   Patient Name: Dana Kramer MRN: [id number] DOB:Mar 25, 1965, 58 y.o., female Today's Date: 07/19/2023  PCP: Kaylee Partridge, MD REFERRING PROVIDER: Sim Dryer, MD   PT End of Session - 07/19/23 1523     Visit Number 12   # unchanged due to screen only   PT Start Time 1521    PT Stop Time 1525    PT Time Calculation (min) 4 min    Activity Tolerance Patient tolerated treatment well    Behavior During Therapy [facility] for tasks assessed/performed             Past Medical History:  Diagnosis Date   Allergy 11/1998   Penicillin   Anemia    Chicken pox    History of transfusion of whole blood    Hyperlipidemia    Hypertension    Hypothyroidism    Malignant neoplasm of upper-outer quadrant of right breast in female, estrogen receptor positive (HCC) 01/13/2021   Migraine    Pre-diabetes    Thyroid  disease    Hypothyroidism   Past Surgical History:  Procedure Laterality Date   ABDOMINOPLASTY  10/01/2010   CESAREAN SECTION     twice   HERNIA REPAIR     PORT-A-CATH REMOVAL     PORTACATH PLACEMENT Right 01/23/2021   Procedure: Right internal jugular 8 [place] PowerPort with C arm and ultrasound guidance;  Surgeon: Sim Dryer, MD;  Location: [facility];  Service: General;  Laterality: Right;   ROBOT ASSISTED LAPAROSCOPIC NEPHRECTOMY Left 01/07/2023   Procedure: XI ROBOTIC ASSISTED LAPAROSCOPIC NEPHRECTOMY WITH TAP BLOCK;  Surgeon: Andrez Banker, MD;  Location: [facility];  Service: Urology;  Laterality: Left;   SENTINEL NODE BIOPSY Right 01/23/2021   Procedure: Right axillary sentinel lymph node mapping using Mag Trace;  Surgeon: Sim Dryer, MD;  Location: [facility];  Service: General;  Laterality: Right;   SIMPLE MASTECTOMY WITH AXILLARY SENTINEL NODE BIOPSY Bilateral 01/23/2021   Procedure: Right simple mastectomy ;  Left risk reducing simple mastectomy;  Surgeon: Sim Dryer, MD;  Location: [facility] OR;  Service: General;   Laterality: Bilateral;   TUBAL LIGATION     umblica hernia repair  10/01/2010   Patient Active Problem List   Diagnosis Date Noted   Osteoporosis 04/12/2023   Primary renal cell carcinoma of left kidney (HCC) 01/19/2023   Left renal mass 01/07/2023   Port-A-Cath in place 03/04/2021   Genetic testing 01/27/2021   S/P mastectomy, bilateral 01/23/2021   Family history of colon cancer 01/15/2021   Malignant neoplasm of upper-outer quadrant of right breast in female, estrogen receptor positive (HCC) 01/13/2021   Pre-diabetes 11/06/2020   Acquired hypothyroidism 04/14/2018   Migraine 01/18/2015   Insomnia 11/05/2011   Anxiety 11/05/2011   Hyperlipidemia 11/05/2011   Tachycardia 11/05/2011   Umbilical hernia 11/19/2010    REFERRING DIAG: right breast cancer at risk for lymphedema  THERAPY DIAG: Aftercare following surgery for neoplasm   PERTINENT HISTORY:  Patient was diagnosed on 12/10/2020 with right grade II invasive ductal carcinoma breast cancer. She underwent a bilateral mastectomy with a right sentinel node biopsy (4 negative nodes) on 01/23/2021. It is ER/PR positive and HER2 negative with a Ki67 of 10%.  Currently on herceptin  q3weeks and tamoxifen ; 12/2022 diagnosed renal cancer and had the Lt kidney removed  PRECAUTIONS: right UE Lymphedema risk,   SUBJECTIVE: Pt returns for her 6 month L-Dex screen.   PAIN: No  SOZO SCREENING: Patient was assessed today using the  SOZO machine to determine the lymphedema index score. This was compared to her baseline score. It was determined that she is within the recommended range when compared to her baseline and no further action is needed at this time. She will continue SOZO screenings. These are done every 3 months for 2 years post operatively followed by every 6 months for 2 years, and then annually.   L-DEX FLOWSHEETS - 07/19/23 1500       L-DEX LYMPHEDEMA SCREENING   Measurement Type Unilateral    L-DEX MEASUREMENT EXTREMITY  Upper Extremity    POSITION  Standing    DOMINANT SIDE Right    At Risk Side Right    BASELINE SCORE (UNILATERAL) -4.5    L-DEX SCORE (UNILATERAL) -2.9    VALUE CHANGE (UNILAT) 1.6             Denyce Flank, PTA 07/19/2023, 3:25 PM

## 2023-07-26 ENCOUNTER — Inpatient Hospital Stay: Payer: Managed Care, Other (non HMO)

## 2023-07-26 ENCOUNTER — Inpatient Hospital Stay: Payer: Managed Care, Other (non HMO) | Admitting: Hematology

## 2023-08-08 ENCOUNTER — Other Ambulatory Visit: Payer: Self-pay | Admitting: Family Medicine

## 2023-08-08 DIAGNOSIS — E785 Hyperlipidemia, unspecified: Secondary | ICD-10-CM

## 2023-08-22 ENCOUNTER — Other Ambulatory Visit: Payer: Self-pay | Admitting: Nurse Practitioner

## 2023-08-22 DIAGNOSIS — C50411 Malignant neoplasm of upper-outer quadrant of right female breast: Secondary | ICD-10-CM

## 2023-08-22 NOTE — Assessment & Plan Note (Signed)
 Stage IA, c(T1bN0M0), Triple positive, Grade 2 -found on screening mammogram. 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 -S/p b/l mastectomies on 01/23/21 with Dr. Afton Horse. 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 Port was placed during surgery -s/p weekly Abraxane /Herceptin  x12 02/25/21 - 05/12/21, -she completed one year maintenance herceptin  injection in Jan 2024 -she began tamoxifen  on 06/20/21. She is tolerating well overall. She notes she has not had a period since completing chemo. We will check her hormone levels in 1-2 years and we can consider switching her to AI when she became postmenopausal.

## 2023-08-22 NOTE — Progress Notes (Unsigned)
 Patient Care Team: Copland, Skipper Dumas, MD as PCP - General (Family Medicine) Sim Dryer, MD as Consulting Physician (General Surgery) Sonja Loraine, MD as Consulting Physician (Hematology) Colie Dawes, MD as Attending Physician (Radiation Oncology) Auther Bo, RN as Oncology Nurse Navigator Alane Hsu, RN as Oncology Nurse Navigator Bensimhon, Rheta Celestine, MD as Consulting Physician (Cardiology) Sharyon Deis, NP as Nurse Practitioner (Hematology and Oncology) Andrez Banker, MD as Attending Physician (Urology)  Clinic Day:  08/23/2023  Referring physician: Kaylee Partridge, MD  ASSESSMENT & PLAN:   Assessment & Plan: Malignant neoplasm of upper-outer quadrant of right breast in female, estrogen receptor positive (HCC) Stage IA, c(T1bN0M0), Triple positive, Grade 2 -found on screening mammogram. 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 -S/p b/l mastectomies on 01/23/21 with Dr. Afton Horse. 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 Port was placed during surgery -s/p weekly Abraxane /Herceptin  x12 02/25/21 - 05/12/21, -she completed one year maintenance herceptin  injection in Jan 2024 -she began tamoxifen  on 06/20/21. She is tolerating well overall. She notes she has not had a period since completing chemo.  - She did have check of estradiol , FSH/LH.  These labs did indicate that patient is postmenopausal.  We will continue her on tamoxifen  25 mg daily due to osteoporosis.  Will not switch to AI due to risk of worsening osteoporosis.  Bone density done 12/10/2022 did show osteoporosis.  She is now taking Zometa  infusions every 6 months.     Primary renal cell carcinoma of left kidney (HCC) 04/20/2023 -CT chest, abdomen, or pelvis, with and without contrast, was negative for evidence of recurrent or metastatic disease chest, abdomen, or pelvis.    Osteoporosis Bone density from 12/10/2022 showing osteoporosis.   Currently on tamoxifen  25 mg daily.  Getting Zometa  infusion every 6 months.  Scheduled for infusion today.  Next 1 due in December 2025.  Repeat bone density should be scheduled in October 2026.  Plan Labs reviewed. - Mild anemia with Hgb 11.9 and HCT 34.4. - CMP is unremarkable with normal GFR.  Ca is 9.2. - CA 27-29 is pending. - Cardiac reveal drawn during today's labs.  Will take approximately 3 to 4 weeks to return. Continue tamoxifen  25 mg daily.  The patient is considered postmenopausal, will continue tamoxifen  due to osteoporosis.  AIs tend to worsen osteoporosis.  Recommend she continue to stay active with light, weightbearing activities.  Continue calcium and vitamin D  daily. Labs and follow-up in 3 months. Labs, follow-up, and Zometa  infusion in 6 months.  The patient understands the plans discussed today and is in agreement with them.  She knows to contact our office if she develops concerns prior to her next appointment.  I provided 25 minutes of face-to-face time during this encounter and > 50% was spent counseling as documented under my assessment and plan.    Sharyon Deis, NP  [facility] - A DEPT OF [facility] [address] [place] [address] Dept: [phone] Dept Fax: [phone]   No orders of the defined types were placed in this encounter.     CHIEF COMPLAINT:  CC: Right breast cancer, estrogen receptor positive  Current Treatment: Tamoxifen   INTERVAL HISTORY:  Dana Kramer is here today for repeat clinical assessment.  She was last seen by Dr. Maryalice Smaller on 05/11/2023.  She is considered osteoporotic.  Getting IV Zometa  every 12 weeks.  Next DEXA scan due in October 2026.  She reports feeling well with no new concerns or complaints.  She is very active.  She has not noticed any new lumps or masses in the chest wall or axillary regions.  She did have CT CAP through nephrologist due to history of  renal cell carcinoma.  CT CAP was negative for lymphadenopathy, recurrence, or metastatic disease in the chest, abdomen, or pelvis.  She denies chest pain, chest pressure, or shortness of breath. She denies headaches or visual disturbances. She denies abdominal pain, nausea, vomiting, or changes in bowel or bladder habits.  She denies fevers or chills. She denies pain. Her appetite is good. Her weight has been stable.  I have reviewed the past medical history, past surgical history, social history and family history with the patient and they are unchanged from previous note.  ALLERGIES:  is allergic to betadine [povidone iodine] and penicillins.  MEDICATIONS:  Current Outpatient Medications  Medication Sig Dispense Refill   amLODipine  (NORVASC ) 5 MG tablet Take 1 tablet (5 mg total) by mouth daily. 90 tablet 3   b complex vitamins capsule Take 1 capsule by mouth daily.     Calcium Citrate-Vitamin D  (CALCIUM + D PO) Take 1 tablet by mouth daily.     Cholecalciferol (VITAMIN D  PO) Take 2,000 Units by mouth daily.     levothyroxine  (SYNTHROID ) 50 MCG tablet Take 1 tablet (50 mcg total) by mouth daily before breakfast. 90 tablet 3   meclizine  (ANTIVERT ) 25 MG tablet Take 1 tablet (25 mg total) by mouth 3 (three) times daily as needed for dizziness. 30 tablet 0   melatonin 5 MG TABS Take 5 mg by mouth at bedtime as needed (Sleep).     metoprolol  succinate (TOPROL -XL) 50 MG 24 hr tablet TAKE 1 TABLET(50 MG) BY MOUTH DAILY WITH OR IMMEDIATELY FOLLOWING A MEAL 90 tablet 3   Multiple Vitamins-Minerals (WOMENS MULTIVITAMIN PO) Take 1 tablet by mouth daily.     ondansetron  (ZOFRAN ) 4 MG tablet Take 1 tablet (4 mg total) by mouth every 8 (eight) hours as needed for nausea or vomiting. 30 tablet 2   simvastatin  (ZOCOR ) 20 MG tablet TAKE 1 TABLET(20 MG) BY MOUTH AT BEDTIME 90 tablet 3   tamoxifen  (NOLVADEX ) 20 MG tablet TAKE 1 TABLET(20 MG) BY MOUTH DAILY 90 tablet 3   traZODone  (DESYREL ) 50 MG tablet TAKE  1/2 TO 1 TABLET(25 TO 50 MG) BY MOUTH AT BEDTIME AS NEEDED FOR SLEEP 90 tablet 3   No current facility-administered medications for this visit.   Facility-Administered Medications Ordered in Other Visits  Medication Dose Route Frequency Provider Last Rate Last Admin   0.9 %  sodium chloride  infusion   Intravenous Once Sonja [place], MD       Zoledronic  Acid (ZOMETA ) IVPB 4 mg  4 mg Intravenous Once Sonja [place], MD        HISTORY OF PRESENT ILLNESS:   Oncology History Overview Note   Cancer Staging  Malignant neoplasm of upper-outer quadrant of right breast in female, estrogen receptor positive (HCC) Staging form: Breast, AJCC 8th Edition - Clinical stage from 01/08/2021: Stage IA (cT1b, cN0, cM0, G2, ER+, PR+, HER2+) - Signed by Sonja [place], MD on 01/15/2021 - Pathologic stage from 01/23/2021: Stage IA (pT1c, pN0, cM0, G2, ER+, PR+, HER2+) - Signed by Sonja [place], MD on 02/13/2021     Malignant neoplasm of upper-outer quadrant of right breast in female, estrogen receptor positive (HCC)  01/02/2021 Mammogram  EXAM: DIGITAL DIAGNOSTIC BILATERAL MAMMOGRAM WITH TOMOSYNTHESIS AND CAD; ULTRASOUND LEFT BREAST LIMITED; ULTRASOUND RIGHT BREAST LIMITED  IMPRESSION: 1. Highly suspicious 0.7 cm UPPER-OUTER RIGHT breast mass with associated suspicious calcifications extending 1.5 cm anteriorly. Tissue sampling is recommended. 2. No abnormal appearing RIGHT axillary lymph nodes. 3. Benign LOWER LEFT breast cyst corresponding to the LEFT breast screening study finding.   01/08/2021 Cancer Staging   Staging form: Breast, AJCC 8th Edition - Clinical stage from 01/08/2021: Stage IA (cT1b, cN0, cM0, G2, ER+, PR+, HER2+) - Signed by Sonja [place], MD on 01/15/2021 Stage prefix: Initial diagnosis Nuclear grade: G2 Histologic grading system: 3 grade system   01/08/2021 Pathology Results   Diagnosis Breast, right, needle core biopsy, upper outer quadrant, 11 o'clock, 5cmfn, ribbon clip - INVASIVE DUCTAL CARCINOMA -  DUCTAL CARCINOMA IN SITU WITH CALCIFICATIONS - SEE COMMENT Microscopic Comment Based on the biopsy, the carcinoma appears Nottingham grade 2 of 3 and measures 0.6 cm in greatest linear extent.  PROGNOSTIC INDICATORS Results: The tumor cells are 2+ for Her2 (EQUIVOCAL). Her2 by FISH will be performed and results reported separately. Estrogen Receptor: 100%, POSITIVE, STRONG STAINING INTENSITY Progesterone Receptor: 70%, POSITIVE, STRONG STAINING INTENSITY Proliferation Marker Ki67: 10%  FLUORESCENCE IN-SITU HYBRIDIZATION Results: GROUP 1: HER2 **POSITIVE**   01/13/2021 Initial Diagnosis   Malignant neoplasm of upper-outer quadrant of right breast in female, estrogen receptor positive (HCC)   01/15/2021 Genetic Testing   [facility] CustomNext Panel was Negative. Report date is 01/26/2021.  The CustomNext-Cancer+RNAinsight panel offered by [employer] [facility] includes sequencing and rearrangement analysis for the following 47 genes:  APC, ATM, AXIN2, BARD1, BMPR1A, BRCA1, BRCA2, BRIP1, CDH1, CDK4, CDKN2A, CHEK2, CTNNA1, DICER1, EPCAM, GREM1, HOXB13, KIT, MEN1, MLH1, MSH2, MSH3, MSH6, MUTYH, NBN, NF1, NTHL1, PALB2, PDGFRA, PMS2, POLD1, POLE, PTEN, RAD50, RAD51C, RAD51D, SDHA, SDHB, SDHC, SDHD, SMAD4, SMARCA4, STK11, TP53, TSC1, TSC2, and VHL.  RNA data is routinely analyzed for use in variant interpretation for all genes.   01/23/2021 Cancer Staging   Staging form: Breast, AJCC 8th Edition - Pathologic stage from 01/23/2021: Stage IA (pT1c, pN0, cM0, G2, ER+, PR+, HER2+) - Signed by Sonja [place], MD on 02/13/2021 Histologic grading system: 3 grade system Residual tumor (R): R0 - None   01/23/2021 Definitive Surgery   FINAL MICROSCOPIC DIAGNOSIS:   A. BREAST, LEFT, MASTECTOMY:  - Fibrocystic changes with sclerosing adenosis and calcifications.  - Fibroadenoma (0.6 cm).  - No evidence of malignancy.   B. BREAST, RIGHT, MASTECTOMY:  - Invasive and in situ ductal carcinoma, 1.7 cm.  - Margins  negative for carcinoma.  - Biopsy site and biopsy clip.  - See oncology table.   C. LYMPH NODE, RIGHT AXILLARY, SENTINEL, EXCISION:  - One lymph node negative for metastatic carcinoma (0/1).   D. LYMPH NODE, RIGHT AXILLARY, SENTINEL, EXCISION:  - One lymph node negative for metastatic carcinoma (0/1).   E. LYMPH NODE, RIGHT AXILLARY, SENTINEL, EXCISION:  - One lymph node negative for metastatic carcinoma (0/1).   F. LYMPH NODE, RIGHT AXILLARY, SENTINEL, EXCISION:  - One lymph node negative for metastatic carcinoma (0/1).    02/25/2021 - 10/22/2021 Chemotherapy   Patient is on Treatment Plan : BREAST Paclitaxel  + Trastuzumab  q7d / Trastuzumab  q21d     02/25/2021 - 04/08/2022 Chemotherapy   Patient is on Treatment Plan : BREAST Paclitaxel  + Trastuzumab  q7d / Trastuzumab  q21d     Primary renal cell carcinoma of left kidney (HCC)  01/07/2023 Cancer Staging   Staging form: Kidney, AJCC 8th Edition -  Pathologic stage from 01/07/2023: Stage I (pT1b, pN0, cM0) - Signed by Sonja [place], MD on 01/19/2023 Histologic grade (G): G3 Histologic grading system: 4 grade system Residual tumor (R): R0 - None   01/19/2023 Initial Diagnosis   Primary renal cell carcinoma of left kidney (HCC)       REVIEW OF SYSTEMS:   Constitutional: Denies fevers, chills or abnormal weight loss Eyes: Denies blurriness of vision Ears, nose, mouth, throat, and face: Denies mucositis or sore throat Respiratory: Denies cough, dyspnea or wheezes Cardiovascular: Denies palpitation, chest discomfort or lower extremity swelling Gastrointestinal:  Denies nausea, heartburn or change in bowel habits Skin: Denies abnormal skin rashes Lymphatics: Denies new lymphadenopathy or easy bruising Neurological:Denies numbness, tingling or new weaknesses Behavioral/Psych: Mood is stable, no new changes  All other systems were reviewed with the patient and are negative.   VITALS:   Today's Vitals   08/23/23 0911 08/23/23 0913   BP: 110/70   Pulse: 88   Resp: 16   Temp: 98.2 F (36.8 C)   TempSrc: Temporal   SpO2: 98%   Weight: 121 lb 14.4 oz (55.3 kg)   Height: 5' (1.524 m)   PainSc:  0-No pain   Body mass index is 23.81 kg/m.   Wt Readings from Last 3 Encounters:  08/23/23 121 lb 14.4 oz (55.3 kg)  07/19/23 121 lb 6 oz (55.1 kg)  06/07/23 116 lb 9.6 oz (52.9 kg)    Body mass index is 23.81 kg/m.  Performance status (ECOG): 0 - Asymptomatic  PHYSICAL EXAM:   GENERAL:alert, no distress and comfortable SKIN: skin color, texture, turgor are normal, no rashes or significant lesions EYES: normal, Conjunctiva are pink and non-injected, sclera clear OROPHARYNX:no exudate, no erythema and lips, buccal mucosa, and tongue normal  NECK: supple, thyroid  normal size, non-tender, without nodularity LYMPH:  no palpable lymphadenopathy in the cervical, axillary or inguinal LUNGS: clear to auscultation and percussion with normal breathing effort HEART: regular rate & rhythm and no murmurs and no lower extremity edema ABDOMEN:abdomen soft, non-tender and normal bowel sounds Musculoskeletal:no cyanosis of digits and no clubbing  NEURO: alert & oriented x 3 with fluent speech, no focal motor/sensory deficits CHEST WALL: Bilateral total mastectomy scars which are well-healed.  No swelling or lumps, or masses noted around the chest wall.  There is no axillary lymphadenopathy on the right but the left.  LABORATORY DATA:  I have reviewed the data as listed    Component Value Date/Time   NA 141 08/23/2023 0850   K 3.9 08/23/2023 0850   CL 108 08/23/2023 0850   CO2 24 08/23/2023 0850   GLUCOSE 121 (H) 08/23/2023 0850   BUN 12 08/23/2023 0850   CREATININE 1.03 (H) 08/23/2023 0850   CREATININE 0.83 11/02/2019 1131   CALCIUM 9.2 08/23/2023 0850   PROT 7.2 08/23/2023 0850   ALBUMIN 4.4 08/23/2023 0850   AST 23 08/23/2023 0850   ALT 12 08/23/2023 0850   ALKPHOS 36 (L) 08/23/2023 0850   BILITOT 0.3 08/23/2023  0850   GFRNONAA >60 08/23/2023 0850    Lab Results  Component Value Date   WBC 5.5 08/23/2023   NEUTROABS 2.3 08/23/2023   HGB 11.9 (L) 08/23/2023   HCT 34.4 (L) 08/23/2023   MCV 84.7 08/23/2023   PLT 197 08/23/2023

## 2023-08-22 NOTE — Assessment & Plan Note (Signed)
 04/20/2023 -CT chest, abdomen, or pelvis, with and without contrast, was negative for evidence of recurrent or metastatic disease chest, abdomen, or pelvis.

## 2023-08-23 ENCOUNTER — Encounter: Payer: Self-pay | Admitting: Nurse Practitioner

## 2023-08-23 ENCOUNTER — Inpatient Hospital Stay

## 2023-08-23 ENCOUNTER — Inpatient Hospital Stay: Attending: Hematology | Admitting: Nurse Practitioner

## 2023-08-23 VITALS — BP 110/70 | HR 88 | Temp 98.2°F | Resp 16 | Ht 60.0 in | Wt 121.9 lb

## 2023-08-23 DIAGNOSIS — Z17410 Hormone receptor positive with human epidermal growth factor receptor 2 positive status: Secondary | ICD-10-CM | POA: Insufficient documentation

## 2023-08-23 DIAGNOSIS — C50411 Malignant neoplasm of upper-outer quadrant of right female breast: Secondary | ICD-10-CM | POA: Insufficient documentation

## 2023-08-23 DIAGNOSIS — Z170 Estrogen receptor positive status [ER+]: Secondary | ICD-10-CM | POA: Diagnosis not present

## 2023-08-23 DIAGNOSIS — C642 Malignant neoplasm of left kidney, except renal pelvis: Secondary | ICD-10-CM

## 2023-08-23 DIAGNOSIS — Z79810 Long term (current) use of selective estrogen receptor modulators (SERMs): Secondary | ICD-10-CM | POA: Insufficient documentation

## 2023-08-23 DIAGNOSIS — M810 Age-related osteoporosis without current pathological fracture: Secondary | ICD-10-CM | POA: Diagnosis present

## 2023-08-23 DIAGNOSIS — Z95828 Presence of other vascular implants and grafts: Secondary | ICD-10-CM

## 2023-08-23 LAB — CBC WITH DIFFERENTIAL (CANCER CENTER ONLY)
Abs Immature Granulocytes: 0.01 10*3/uL (ref 0.00–0.07)
Basophils Absolute: 0 10*3/uL (ref 0.0–0.1)
Basophils Relative: 1 %
Eosinophils Absolute: 0.1 10*3/uL (ref 0.0–0.5)
Eosinophils Relative: 3 %
HCT: 34.4 % — ABNORMAL LOW (ref 36.0–46.0)
Hemoglobin: 11.9 g/dL — ABNORMAL LOW (ref 12.0–15.0)
Immature Granulocytes: 0 %
Lymphocytes Relative: 48 %
Lymphs Abs: 2.7 10*3/uL (ref 0.7–4.0)
MCH: 29.3 pg (ref 26.0–34.0)
MCHC: 34.6 g/dL (ref 30.0–36.0)
MCV: 84.7 fL (ref 80.0–100.0)
Monocytes Absolute: 0.3 10*3/uL (ref 0.1–1.0)
Monocytes Relative: 6 %
Neutro Abs: 2.3 10*3/uL (ref 1.7–7.7)
Neutrophils Relative %: 42 %
Platelet Count: 197 10*3/uL (ref 150–400)
RBC: 4.06 MIL/uL (ref 3.87–5.11)
RDW: 11.8 % (ref 11.5–15.5)
WBC Count: 5.5 10*3/uL (ref 4.0–10.5)
nRBC: 0 % (ref 0.0–0.2)

## 2023-08-23 LAB — CMP (CANCER CENTER ONLY)
ALT: 12 U/L (ref 0–44)
AST: 23 U/L (ref 15–41)
Albumin: 4.4 g/dL (ref 3.5–5.0)
Alkaline Phosphatase: 36 U/L — ABNORMAL LOW (ref 38–126)
Anion gap: 9 (ref 5–15)
BUN: 12 mg/dL (ref 6–20)
CO2: 24 mmol/L (ref 22–32)
Calcium: 9.2 mg/dL (ref 8.9–10.3)
Chloride: 108 mmol/L (ref 98–111)
Creatinine: 1.03 mg/dL — ABNORMAL HIGH (ref 0.44–1.00)
GFR, Estimated: >60 mL/min (ref >60)
Glucose, Bld: 121 mg/dL — ABNORMAL HIGH (ref 70–99)
Potassium: 3.9 mmol/L (ref 3.5–5.1)
Sodium: 141 mmol/L (ref 135–145)
Total Bilirubin: 0.3 mg/dL (ref 0.0–1.2)
Total Protein: 7.2 g/dL (ref 6.5–8.1)

## 2023-08-23 MED ORDER — TAMOXIFEN CITRATE 20 MG PO TABS
ORAL_TABLET | ORAL | 3 refills | Status: AC
Start: 2023-08-23 — End: ?

## 2023-08-23 MED ORDER — SODIUM CHLORIDE 0.9 % IV SOLN: Freq: Once | INTRAVENOUS | Status: AC

## 2023-08-23 MED ORDER — ZOLEDRONIC ACID 4 MG/100ML IV SOLN
4.0000 mg | Freq: Once | INTRAVENOUS | Status: AC
  Administered 2023-08-23: 4 mg via INTRAVENOUS
  Filled 2023-08-23: qty 100

## 2023-08-23 NOTE — Patient Instructions (Signed)

## 2023-08-24 LAB — CANCER ANTIGEN 27.29: CA 27.29: 17.5 U/mL (ref 0.0–38.6)

## 2023-09-03 ENCOUNTER — Encounter: Payer: Self-pay | Admitting: Hematology

## 2023-09-03 LAB — GUARDANT REVEAL

## 2023-09-17 ENCOUNTER — Telehealth: Payer: Self-pay | Admitting: Hematology

## 2023-09-17 NOTE — Telephone Encounter (Signed)
 Rescheule appointment per provider pal.  Called left VM with changes made to the upcoming appointment.

## 2023-11-01 ENCOUNTER — Other Ambulatory Visit: Payer: Self-pay | Admitting: Family Medicine

## 2023-11-01 DIAGNOSIS — I10 Essential (primary) hypertension: Secondary | ICD-10-CM

## 2023-11-10 ENCOUNTER — Other Ambulatory Visit: Payer: Self-pay | Admitting: Family Medicine

## 2023-11-10 DIAGNOSIS — E039 Hypothyroidism, unspecified: Secondary | ICD-10-CM

## 2023-11-10 NOTE — Telephone Encounter (Signed)
 Refill for levothyroxine  25mcg denied. Dose changed.

## 2023-11-15 ENCOUNTER — Other Ambulatory Visit: Payer: Self-pay

## 2023-11-17 ENCOUNTER — Other Ambulatory Visit: Payer: Self-pay | Admitting: Hematology

## 2023-11-17 DIAGNOSIS — C642 Malignant neoplasm of left kidney, except renal pelvis: Secondary | ICD-10-CM

## 2023-11-17 DIAGNOSIS — Z170 Estrogen receptor positive status [ER+]: Secondary | ICD-10-CM

## 2023-11-17 NOTE — Assessment & Plan Note (Signed)
 Stage IA, c(T1bN0M0), Triple positive, Grade 2 -found on screening mammogram. 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 -S/p b/l mastectomies on 01/23/21 with Dr. Luisa Hart. 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 Port was placed during surgery -s/p weekly Abraxane/Herceptin x12 02/25/21 - 05/12/21, -she completed one year maintenance herceptin injection in Jan 2024 -she began tamoxifen on 06/20/21. She is tolerating well overall. She notes she has not had a period since completing chemo. We will check her hormone levels in 1-2 years and we can consider switching her to AI when she became postmenopausal.

## 2023-11-18 ENCOUNTER — Inpatient Hospital Stay

## 2023-11-18 ENCOUNTER — Inpatient Hospital Stay: Attending: Hematology | Admitting: Hematology

## 2023-11-18 VITALS — BP 116/74 | HR 80 | Temp 97.7°F | Resp 16 | Ht 60.0 in | Wt 123.3 lb

## 2023-11-18 DIAGNOSIS — Z170 Estrogen receptor positive status [ER+]: Secondary | ICD-10-CM

## 2023-11-18 DIAGNOSIS — Z17410 Hormone receptor positive with human epidermal growth factor receptor 2 positive status: Secondary | ICD-10-CM | POA: Diagnosis not present

## 2023-11-18 DIAGNOSIS — Z9013 Acquired absence of bilateral breasts and nipples: Secondary | ICD-10-CM | POA: Insufficient documentation

## 2023-11-18 DIAGNOSIS — C642 Malignant neoplasm of left kidney, except renal pelvis: Secondary | ICD-10-CM | POA: Diagnosis not present

## 2023-11-18 DIAGNOSIS — Z79810 Long term (current) use of selective estrogen receptor modulators (SERMs): Secondary | ICD-10-CM | POA: Insufficient documentation

## 2023-11-18 DIAGNOSIS — R5383 Other fatigue: Secondary | ICD-10-CM | POA: Insufficient documentation

## 2023-11-18 DIAGNOSIS — C50411 Malignant neoplasm of upper-outer quadrant of right female breast: Secondary | ICD-10-CM | POA: Insufficient documentation

## 2023-11-18 LAB — CMP (CANCER CENTER ONLY)
ALT: 10 U/L (ref 0–44)
AST: 23 U/L (ref 15–41)
Albumin: 4.1 g/dL (ref 3.5–5.0)
Alkaline Phosphatase: 36 U/L — ABNORMAL LOW (ref 38–126)
Anion gap: 5 (ref 5–15)
BUN: 9 mg/dL (ref 6–20)
CO2: 27 mmol/L (ref 22–32)
Calcium: 8.8 mg/dL — ABNORMAL LOW (ref 8.9–10.3)
Chloride: 107 mmol/L (ref 98–111)
Creatinine: 0.98 mg/dL (ref 0.44–1.00)
GFR, Estimated: >60 mL/min (ref >60)
Glucose, Bld: 77 mg/dL (ref 70–99)
Potassium: 4.8 mmol/L (ref 3.5–5.1)
Sodium: 139 mmol/L (ref 135–145)
Total Bilirubin: 0.2 mg/dL (ref 0.0–1.2)
Total Protein: 6.8 g/dL (ref 6.5–8.1)

## 2023-11-18 LAB — CBC WITH DIFFERENTIAL (CANCER CENTER ONLY)
Abs Immature Granulocytes: 0.01 K/uL (ref 0.00–0.07)
Basophils Absolute: 0 K/uL (ref 0.0–0.1)
Basophils Relative: 1 %
Eosinophils Absolute: 0.1 K/uL (ref 0.0–0.5)
Eosinophils Relative: 2 %
HCT: 34.4 % — ABNORMAL LOW (ref 36.0–46.0)
Hemoglobin: 11.6 g/dL — ABNORMAL LOW (ref 12.0–15.0)
Immature Granulocytes: 0 %
Lymphocytes Relative: 40 %
Lymphs Abs: 1.9 K/uL (ref 0.7–4.0)
MCH: 28.9 pg (ref 26.0–34.0)
MCHC: 33.7 g/dL (ref 30.0–36.0)
MCV: 85.6 fL (ref 80.0–100.0)
Monocytes Absolute: 0.3 K/uL (ref 0.1–1.0)
Monocytes Relative: 6 %
Neutro Abs: 2.5 K/uL (ref 1.7–7.7)
Neutrophils Relative %: 51 %
Platelet Count: 176 K/uL (ref 150–400)
RBC: 4.02 MIL/uL (ref 3.87–5.11)
RDW: 11.9 % (ref 11.5–15.5)
WBC Count: 4.8 K/uL (ref 4.0–10.5)
nRBC: 0 % (ref 0.0–0.2)

## 2023-11-18 NOTE — Assessment & Plan Note (Signed)
 pT1bN0, stage I - Adjuvant therapy not recommended  -on surveillance, followed by urology

## 2023-11-18 NOTE — Progress Notes (Signed)
 [facility]   [phone] [phone]   Clinic Follow up Note   Patient Care Team: Copland, Harlene BROCKS, MD as PCP - General (Family Medicine) Vanderbilt Ned, MD as Consulting Physician (General Surgery) Lanny Callander, MD as Consulting Physician (Hematology) Izell Domino, MD as Attending Physician (Radiation Oncology) Tyree Nanetta SAILOR, RN as Oncology Nurse Navigator Bensimhon, Toribio SAUNDERS, MD as Consulting Physician (Cardiology) Hanford Powell BRAVO, NP as Nurse Practitioner (Hematology and Oncology) Cam Morene ORN, MD as Attending Physician (Urology)  Date of Service:  11/18/2023  CHIEF COMPLAINT: f/u of right breast cancer  CURRENT THERAPY:  adjuvant tamoxifen   Oncology History   Malignant neoplasm of upper-outer quadrant of right breast in female, estrogen receptor positive (HCC) Stage IA, c(T1bN0M0), Triple positive, Grade 2 -found on screening mammogram. 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 -S/p b/l mastectomies on 01/23/21 with Dr. Vanderbilt. 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 Port was placed during surgery -s/p weekly Abraxane /Herceptin  x12 02/25/21 - 05/12/21, -she completed one year maintenance herceptin  injection in Jan 2024 -she began tamoxifen  on 06/20/21. She is tolerating well overall. She notes she has not had a period since completing chemo. We will check her hormone levels in 1-2 years and we can consider switching her to AI when she became postmenopausal.   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 Considering switching to an aromatase inhibitor if postmenopausal status is confirmed, as it is a stronger anti-estrogen  therapy. The difference in efficacy may be small given the initial small size of the disease. She is concerned about potential weight gain with medication changes. - Continue tamoxifen  - Check FSH to confirm postmenopausal status - Consider switching to aromatase inhibitor if postmenopausal - Order bone density scan in November - Review CT scan report from September 3rd  Fatigue Persistent fatigue without clear etiology. Thyroid  function and vitamin D  levels were normal in recent tests. No other symptoms such as pain or discomfort reported. - Follow up with primary care provider for further evaluation of fatigue  Right kidney cancer - Status post resection - Follow-up by urology.  Recent CT scan from November 10, 2023 were negative.  Plan - She is clinically doing well, I requested her recent CT from her urology office, which was negative for malignancy or other findings. - Continue tamoxifen  - Lab and follow-up in 6 months.   SUMMARY OF ONCOLOGIC HISTORY: Oncology History Overview Note   Cancer Staging  Malignant neoplasm of upper-outer quadrant of right breast in female, estrogen receptor positive (HCC) Staging form: Breast, AJCC 8th Edition - Clinical stage from 01/08/2021: Stage IA (cT1b, cN0, cM0, G2, ER+, PR+, HER2+) - Signed by Lanny Callander, MD on 01/15/2021 - Pathologic stage from 01/23/2021: Stage IA (pT1c, pN0, cM0, G2, ER+, PR+, HER2+) - Signed by Lanny Callander, MD on 02/13/2021     Malignant neoplasm of upper-outer quadrant of right breast in female, estrogen receptor positive (HCC)  01/02/2021 Mammogram   EXAM: DIGITAL DIAGNOSTIC BILATERAL MAMMOGRAM WITH TOMOSYNTHESIS AND CAD; ULTRASOUND LEFT BREAST LIMITED; ULTRASOUND RIGHT BREAST LIMITED  IMPRESSION: 1. Highly suspicious 0.7 cm UPPER-OUTER RIGHT breast mass with associated suspicious calcifications extending 1.5 cm anteriorly. Tissue sampling is recommended. 2. No  abnormal appearing RIGHT axillary lymph nodes. 3. Benign  LOWER LEFT breast cyst corresponding to the LEFT breast screening study finding.   01/08/2021 Cancer Staging   Staging form: Breast, AJCC 8th Edition - Clinical stage from 01/08/2021: Stage IA (cT1b, cN0, cM0, G2, ER+, PR+, HER2+) - Signed by Lanny Callander, MD on 01/15/2021 Stage prefix: Initial diagnosis Nuclear grade: G2 Histologic grading system: 3 grade system   01/08/2021 Pathology Results   Diagnosis Breast, right, needle core biopsy, upper outer quadrant, 11 o'clock, 5cmfn, ribbon clip - INVASIVE DUCTAL CARCINOMA - DUCTAL CARCINOMA IN SITU WITH CALCIFICATIONS - SEE COMMENT Microscopic Comment Based on the biopsy, the carcinoma appears Nottingham grade 2 of 3 and measures 0.6 cm in greatest linear extent.  PROGNOSTIC INDICATORS Results: The tumor cells are 2+ for Her2 (EQUIVOCAL). Her2 by FISH will be performed and results reported separately. Estrogen Receptor: 100%, POSITIVE, STRONG STAINING INTENSITY Progesterone Receptor: 70%, POSITIVE, STRONG STAINING INTENSITY Proliferation Marker Ki67: 10%  FLUORESCENCE IN-SITU HYBRIDIZATION Results: GROUP 1: HER2 **POSITIVE**   01/13/2021 Initial Diagnosis   Malignant neoplasm of upper-outer quadrant of right breast in female, estrogen receptor positive (HCC)   01/15/2021 Genetic Testing   [facility] CustomNext Panel was Negative. Report date is 01/26/2021.  The CustomNext-Cancer+RNAinsight panel offered by [employer] includes sequencing and rearrangement analysis for the following 47 genes:  APC, ATM, AXIN2, BARD1, BMPR1A, BRCA1, BRCA2, BRIP1, CDH1, CDK4, CDKN2A, CHEK2, CTNNA1, DICER1, EPCAM, GREM1, HOXB13, KIT, MEN1, MLH1, MSH2, MSH3, MSH6, MUTYH, NBN, NF1, NTHL1, PALB2, PDGFRA, PMS2, POLD1, POLE, PTEN, RAD50, RAD51C, RAD51D, SDHA, SDHB, SDHC, SDHD, SMAD4, SMARCA4, STK11, TP53, TSC1, TSC2, and VHL.  RNA data is routinely analyzed for use in variant interpretation for all genes.   01/23/2021 Cancer Staging   Staging form: Breast, AJCC 8th  Edition - Pathologic stage from 01/23/2021: Stage IA (pT1c, pN0, cM0, G2, ER+, PR+, HER2+) - Signed by Lanny Callander, MD on 02/13/2021 Histologic grading system: 3 grade system Residual tumor (R): R0 - None   01/23/2021 Definitive Surgery   FINAL MICROSCOPIC DIAGNOSIS:   A. BREAST, LEFT, MASTECTOMY:  - Fibrocystic changes with sclerosing adenosis and calcifications.  - Fibroadenoma (0.6 cm).  - No evidence of malignancy.   B. BREAST, RIGHT, MASTECTOMY:  - Invasive and in situ ductal carcinoma, 1.7 cm.  - Margins negative for carcinoma.  - Biopsy site and biopsy clip.  - See oncology table.   C. LYMPH NODE, RIGHT AXILLARY, SENTINEL, EXCISION:  - One lymph node negative for metastatic carcinoma (0/1).   D. LYMPH NODE, RIGHT AXILLARY, SENTINEL, EXCISION:  - One lymph node negative for metastatic carcinoma (0/1).   E. LYMPH NODE, RIGHT AXILLARY, SENTINEL, EXCISION:  - One lymph node negative for metastatic carcinoma (0/1).   F. LYMPH NODE, RIGHT AXILLARY, SENTINEL, EXCISION:  - One lymph node negative for metastatic carcinoma (0/1).    02/25/2021 - 10/22/2021 Chemotherapy   Patient is on Treatment Plan : BREAST Paclitaxel  + Trastuzumab  q7d / Trastuzumab  q21d     02/25/2021 - 04/08/2022 Chemotherapy   Patient is on Treatment Plan : BREAST Paclitaxel  + Trastuzumab  q7d / Trastuzumab  q21d     Primary renal cell carcinoma of left kidney (HCC)  01/07/2023 Cancer Staging   Staging form: Kidney, AJCC 8th Edition - Pathologic stage from 01/07/2023: Stage I (pT1b, pN0, cM0) - Signed by Lanny Callander, MD on 01/19/2023 Histologic grade (G): G3 Histologic grading system: 4 grade system Residual tumor (R): R0 - None   01/19/2023 Initial Diagnosis   Primary  renal cell carcinoma of left kidney [facility])      Discussed the use of AI scribe software for clinical note transcription with the patient, who gave verbal consent to proceed.  History of Present Illness Dana Kramer is a 58 year old female with  breast cancer who presents for a follow-up visit.  She experiences significant fatigue, which is unusual for her active lifestyle. Her current weight is 123 pounds, with a slight increase since March. She is on tamoxifen  for over two years and does not experience menopausal symptoms such as hot flashes or joint achiness. 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 She has not had radiation therapy.  She underwent a CT scan on September 3rd and awaits a detailed discussion with her urologist on September 29th. She plans to travel to [place] from October 1st to November 17th, with her daughter joining her towards the end of the trip. She expresses concern about the timing of her medical tests and results due to her travel plans.     All other systems were reviewed with the patient and are negative.  MEDICAL HISTORY:  Past Medical History:  Diagnosis Date   Allergy 11/1998   Penicillin   Anemia    Chicken pox    History of transfusion of whole blood    Hyperlipidemia    Hypertension    Hypothyroidism    Malignant neoplasm of upper-outer quadrant of right breast in female, estrogen receptor positive (HCC) 01/13/2021   Migraine    Pre-diabetes    Thyroid  disease    Hypothyroidism    SURGICAL HISTORY: Past Surgical History:  Procedure Laterality Date   ABDOMINOPLASTY  10/01/2010   CESAREAN SECTION     twice   HERNIA REPAIR     PORT-A-CATH REMOVAL     PORTACATH PLACEMENT Right 01/23/2021   Procedure: Right internal jugular 8 [place] PowerPort with C arm and ultrasound guidance;  Surgeon: Vanderbilt Ned, MD;  Location: [facility] OR;  Service: General;  Laterality: Right;   ROBOT ASSISTED LAPAROSCOPIC NEPHRECTOMY Left 01/07/2023   Procedure: XI ROBOTIC ASSISTED LAPAROSCOPIC NEPHRECTOMY WITH TAP BLOCK;  Surgeon: Cam Morene ORN, MD;  Location: [facility] ORS;  Service: Urology;  Laterality: Left;   SENTINEL NODE BIOPSY Right 01/23/2021   Procedure: Right axillary sentinel  lymph node mapping using Mag Trace;  Surgeon: Vanderbilt Ned, MD;  Location: [facility] OR;  Service: General;  Laterality: Right;   SIMPLE MASTECTOMY WITH AXILLARY SENTINEL NODE BIOPSY Bilateral 01/23/2021   Procedure: Right simple mastectomy ;  Left risk reducing simple mastectomy;  Surgeon: Vanderbilt Ned, MD;  Location: [facility] OR;  Service: General;  Laterality: Bilateral;   TUBAL LIGATION     umblica hernia repair  10/01/2010    I have reviewed the social history and family history with the patient and they are unchanged from previous note.  ALLERGIES:  is allergic to betadine [povidone iodine] and penicillins.  MEDICATIONS:  Current Outpatient Medications  Medication Sig Dispense Refill   amLODipine  (NORVASC ) 5 MG tablet TAKE 1 TABLET(5 MG) BY MOUTH DAILY 90 tablet 3   b complex vitamins capsule Take 1 capsule by mouth daily.     Calcium Citrate-Vitamin D  (CALCIUM + D PO) Take 1 tablet by mouth daily.     Cholecalciferol (VITAMIN D  PO) Take 2,000 Units by mouth daily.     levothyroxine  (SYNTHROID ) 50 MCG tablet Take 1 tablet (50 mcg total) by mouth daily before breakfast. 90 tablet 3   melatonin  5 MG TABS Take 5 mg by mouth at bedtime as needed (Sleep).     metoprolol  succinate (TOPROL -XL) 50 MG 24 hr tablet TAKE 1 TABLET(50 MG) BY MOUTH DAILY WITH OR IMMEDIATELY FOLLOWING A MEAL 90 tablet 3   Multiple Vitamins-Minerals (WOMENS MULTIVITAMIN PO) Take 1 tablet by mouth daily.     ondansetron  (ZOFRAN ) 4 MG tablet Take 1 tablet (4 mg total) by mouth every 8 (eight) hours as needed for nausea or vomiting. 30 tablet 2   simvastatin  (ZOCOR ) 20 MG tablet TAKE 1 TABLET(20 MG) BY MOUTH AT BEDTIME 90 tablet 3   tamoxifen  (NOLVADEX ) 20 MG tablet TAKE 1 TABLET(20 MG) BY MOUTH DAILY 90 tablet 3   traZODone  (DESYREL ) 50 MG tablet TAKE 1/2 TO 1 TABLET(25 TO 50 MG) BY MOUTH AT BEDTIME AS NEEDED FOR SLEEP 90 tablet 3   No current facility-administered medications for this visit.    PHYSICAL  EXAMINATION: ECOG PERFORMANCE STATUS: 0 - Asymptomatic  Vitals:   11/18/23 1058  BP: 116/74  Pulse: 80  Resp: 16  Temp: 97.7 F (36.5 C)  SpO2: 99%   Wt Readings from Last 3 Encounters:  11/18/23 123 lb 4.8 oz (55.9 kg)  08/23/23 121 lb 14.4 oz (55.3 kg)  07/19/23 121 lb 6 oz (55.1 kg)     GENERAL:alert, no distress and comfortable SKIN: skin color, texture, turgor are normal, no rashes or significant lesions EYES: normal, Conjunctiva are pink and non-injected, sclera clear NECK: supple, thyroid  normal size, non-tender, without nodularity LYMPH:  no palpable lymphadenopathy in the cervical, axillary  LUNGS: clear to auscultation and percussion with normal breathing effort HEART: regular rate & rhythm and no murmurs and no lower extremity edema ABDOMEN:abdomen soft, non-tender and normal bowel sounds Musculoskeletal:no cyanosis of digits and no clubbing  NEURO: alert & oriented x 3 with fluent speech, no focal motor/sensory deficits Breasts: Status post bilateral breast mastectomy, no palpable nodule on chest wall.  No axillary lymphadenopathy. Physical Exam   LABORATORY DATA:  I have reviewed the data as listed    Latest Ref Rng & Units 11/18/2023   10:31 AM 08/23/2023    8:50 AM 05/11/2023    2:10 PM  CBC  WBC 4.0 - 10.5 K/uL 4.8  5.5  5.5   Hemoglobin 12.0 - 15.0 g/dL 88.3  88.0  87.7   Hematocrit 36.0 - 46.0 % 34.4  34.4  36.7   Platelets 150 - 400 K/uL 176  197  180         Latest Ref Rng & Units 11/18/2023   10:31 AM 08/23/2023    8:50 AM 06/07/2023    8:28 AM  CMP  Glucose 70 - 99 mg/dL 77  878  81   BUN 6 - 20 mg/dL 9  12  11    Creatinine 0.44 - 1.00 mg/dL 9.01  8.96  9.04   Sodium 135 - 145 mmol/L 139  141  141   Potassium 3.5 - 5.1 mmol/L 4.8  3.9  4.1   Chloride 98 - 111 mmol/L 107  108  105   CO2 22 - 32 mmol/L 27  24  26    Calcium 8.9 - 10.3 mg/dL 8.8  9.2  9.3   Total Protein 6.5 - 8.1 g/dL 6.8  7.2    Total Bilirubin 0.0 - 1.2 mg/dL 0.2  0.3     Alkaline Phos 38 - 126 U/L 36  36    AST 15 - 41 U/L 23  23  ALT 0 - 44 U/L 10  12        RADIOGRAPHIC STUDIES: I have personally reviewed the radiological images as listed and agreed with the findings in the report. No results found.    No orders of the defined types were placed in this encounter.  All questions were answered. The patient knows to call the clinic with any problems, questions or concerns. No barriers to learning was detected. The total time spent in the appointment was 25 minutes, including review of chart and various tests results, discussions about plan of care and coordination of care plan     Onita Mattock, MD 11/18/2023

## 2023-11-19 LAB — FOLLICLE STIMULATING HORMONE: FSH: 33.7 m[IU]/mL

## 2023-11-22 ENCOUNTER — Ambulatory Visit: Admitting: Hematology

## 2023-11-22 ENCOUNTER — Encounter: Payer: Self-pay | Admitting: Urology

## 2023-11-22 ENCOUNTER — Other Ambulatory Visit

## 2023-11-23 NOTE — Progress Notes (Addendum)
 [facility] [address] [place], [place] [address] [phone]  Date:  11/29/2023   Name:  Dana Kramer   DOB:  1965/05/21   MRN:  [id number]  PCP:  Watt Harlene BROCKS, MD    Chief Complaint: Follow-up (No concerns )   History of Present Illness:  Dana Kramer is a 58 y.o. very pleasant female patient who presents with the following:  Patient seen today for periodic follow-up.  I saw her most recently in March History of hypothyroidism, hyperlipidemia, prediabetes, osteoporosis, and both breast and more recently diagnosed kidney cancer.  She is status post nephrectomy October 2024, she continues tamoxifen  following breast cancer  She saw her oncologist, Dr. Lanny last week: Malignant neoplasm of upper-outer quadrant of right breast in female, estrogen receptor positive (HCC) Stage IA, c(T1bN0M0), Triple positive, Grade 2 -found on screening mammogram. 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 -S/p b/l mastectomies on 01/23/21 with Dr. Vanderbilt. 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 Port was placed during surgery -s/p weekly Abraxane /Herceptin  x12 02/25/21 - 05/12/21, -she completed one year maintenance herceptin  injection in Jan 2024 -she began tamoxifen  on 06/20/21. She is tolerating well overall. She notes she has not had a period since completing chemo. We will check her hormone levels in 1-2 years and we can consider switching her to AI when she became postmenopausal. 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 Considering switching to an aromatase inhibitor if postmenopausal status is confirmed, as it is a stronger  anti-estrogen therapy. The difference in efficacy may be small given the initial small size of the disease. She is concerned about potential weight gain with medication changes. - Continue tamoxifen  - Check FSH to confirm postmenopausal status - Consider switching to aromatase inhibitor if postmenopausal - Order bone density scan in November - Review CT scan report from September 3rd Fatigue Persistent fatigue without clear etiology. Thyroid  function and vitamin D  levels were normal in recent tests. No other symptoms such as pain or discomfort reported. - Follow up with primary care provider for further evaluation of fatigue Right kidney cancer - Status post resection - Follow-up by urology.  Recent CT scan from November 10, 2023 were negative. Plan - She is clinically doing well, I requested her recent CT from her urology office, which was negative for malignancy or other findings. - Continue tamoxifen  - Lab and follow-up in 6 months.  -Flu vaccine; will do at work  -Recommend COVID booster -It looks like her tetanus is due for an update Cologuard due next year- she plans to do a colonoscopy coming up actually  Pap done 2024, normal  Amlodipine  5 Calcium and vitamin D  Levothyroxine  50 Toprol  XL 50 Simvastatin  20 Tamoxifen  Trazodone   Her labs appear to be up-to-date, we can repeat an A1c if she would like It looks that the plan was to have her do Zometa  infusions for osteoporosis through oncology-need to confirm this got started.    Discussed the use of AI scribe software for clinical note transcription with the patient, who gave verbal consent to proceed.  History of Present Illness Dana Kramer is a 58 year old female with renal  cancer and osteoporosis who presents for follow-up and surveillance.  She is under surveillance for renal cancer and recently had a CT scan of the abdomen and pelvis on November 10, 2023, which showed no suspicious findings.  She is receiving meta  infusions for osteoporosis, with two infusions completed so far. These are scheduled every six months. She experiences bone pain hours after the infusion and avoids ibuprofen  due to having only one kidney, opting for Tylenol  instead.  She experiences extreme tiredness, especially after climbing stairs, feeling as though she might vomit. No chest pain or shortness of breath. Her thyroid  function was normal in May 2025, although it has been high on occasion. She is on levothyroxine .  She is scheduled for a colonoscopy following her OBGYN's recommendation, with an appointment on December 20, 2023. She plans to travel to [place] from October 1 to January 24, 2024, to visit her family, including her mother and sister.  She experiences flu-like symptoms after receiving the flu shot, which she describes as severe.   Patient Active Problem List   Diagnosis Date Noted   Osteoporosis 04/12/2023   Primary renal cell carcinoma of left kidney (HCC) 01/19/2023   Left renal mass 01/07/2023   Port-A-Cath in place 03/04/2021   Genetic testing 01/27/2021   S/P mastectomy, bilateral 01/23/2021   Family history of colon cancer 01/15/2021   Malignant neoplasm of upper-outer quadrant of right breast in female, estrogen receptor positive (HCC) 01/13/2021   Pre-diabetes 11/06/2020   Acquired hypothyroidism 04/14/2018   Migraine 01/18/2015   Insomnia 11/05/2011   Anxiety 11/05/2011   Hyperlipidemia 11/05/2011   Tachycardia 11/05/2011   Umbilical hernia 11/19/2010    Past Medical History:  Diagnosis Date   Allergy 11/1998   Penicillin   Anemia    Chicken pox    History of transfusion of whole blood    Hyperlipidemia    Hypertension    Hypothyroidism    Malignant neoplasm of upper-outer quadrant of right breast in female, estrogen receptor positive (HCC) 01/13/2021   Migraine    Pre-diabetes    Thyroid  disease    Hypothyroidism    Past Surgical History:  Procedure Laterality Date   ABDOMINOPLASTY   10/01/2010   CESAREAN SECTION     twice   HERNIA REPAIR     PORT-A-CATH REMOVAL     PORTACATH PLACEMENT Right 01/23/2021   Procedure: Right internal jugular 8 [place] PowerPort with C arm and ultrasound guidance;  Surgeon: Dana Kramer Ned, MD;  Location: [facility];  Service: General;  Laterality: Right;   ROBOT ASSISTED LAPAROSCOPIC NEPHRECTOMY Left 01/07/2023   Procedure: XI ROBOTIC ASSISTED LAPAROSCOPIC NEPHRECTOMY WITH TAP BLOCK;  Surgeon: Cam Morene ORN, MD;  Location: [facility];  Service: Urology;  Laterality: Left;   SENTINEL NODE BIOPSY Right 01/23/2021   Procedure: Right axillary sentinel lymph node mapping using Mag Trace;  Surgeon: Dana Kramer Ned, MD;  Location: [facility] OR;  Service: General;  Laterality: Right;   SIMPLE MASTECTOMY WITH AXILLARY SENTINEL NODE BIOPSY Bilateral 01/23/2021   Procedure: Right simple mastectomy ;  Left risk reducing simple mastectomy;  Surgeon: Dana Kramer Ned, MD;  Location: [facility];  Service: General;  Laterality: Bilateral;   TUBAL LIGATION     umblica hernia repair  10/01/2010    Social History   Tobacco Use   Smoking status: Never   Smokeless tobacco: Never  Vaping Use   Vaping status: Never Used  Substance Use Topics   Alcohol use: Never   Drug use: Never  Family History  Problem Relation Age of Onset   Hypertension Mother    Cancer Father 67       esophageal cancer   Leukemia Maternal Grandfather        dx. >50   Colon cancer Paternal Grandfather        dx. >50   Lung cancer Cousin        dx. 76s, did not smoke    Allergies  Allergen Reactions   Betadine [Povidone Iodine] Rash   Penicillins Rash    Mainly upper body only.    Medication list has been reviewed and updated.  Current Outpatient Medications on File Prior to Visit  Medication Sig Dispense Refill   amLODipine  (NORVASC ) 5 MG tablet TAKE 1 TABLET(5 MG) BY MOUTH DAILY 90 tablet 3   b complex vitamins capsule Take 1 capsule by mouth daily.     Calcium Citrate-Vitamin  D (CALCIUM + D PO) Take 1 tablet by mouth daily.     Cholecalciferol (VITAMIN D  PO) Take 2,000 Units by mouth daily.     levothyroxine  (SYNTHROID ) 50 MCG tablet Take 1 tablet (50 mcg total) by mouth daily before breakfast. 90 tablet 3   melatonin 5 MG TABS Take 5 mg by mouth at bedtime as needed (Sleep).     metoprolol  succinate (TOPROL -XL) 50 MG 24 hr tablet TAKE 1 TABLET(50 MG) BY MOUTH DAILY WITH OR IMMEDIATELY FOLLOWING A MEAL 90 tablet 3   Multiple Vitamins-Minerals (WOMENS MULTIVITAMIN PO) Take 1 tablet by mouth daily.     ondansetron  (ZOFRAN ) 4 MG tablet Take 1 tablet (4 mg total) by mouth every 8 (eight) hours as needed for nausea or vomiting. 30 tablet 2   simvastatin  (ZOCOR ) 20 MG tablet TAKE 1 TABLET(20 MG) BY MOUTH AT BEDTIME 90 tablet 3   tamoxifen  (NOLVADEX ) 20 MG tablet TAKE 1 TABLET(20 MG) BY MOUTH DAILY 90 tablet 3   traZODone  (DESYREL ) 50 MG tablet TAKE 1/2 TO 1 TABLET(25 TO 50 MG) BY MOUTH AT BEDTIME AS NEEDED FOR SLEEP 90 tablet 3   No current facility-administered medications on file prior to visit.    Review of Systems:  As per HPI- otherwise negative.   Physical Examination: Vitals:   11/29/23 0923  BP: 132/84  Pulse: 74  SpO2: 98%   Vitals:   11/29/23 0923  Weight: 119 lb 9.6 oz (54.3 kg)  Height: 5' (1.524 m)   Body mass index is 23.36 kg/m. Ideal Body Weight: Weight in (lb) to have BMI = 25: 127.7  GEN: no acute distress. Petite build, looks well  HEENT: Atraumatic, Normocephalic.  Ears and Nose: No external deformity. CV: RRR, No M/G/R. No JVD. No thrill. No extra heart sounds. PULM: CTA B, no wheezes, crackles, rhonchi. No retractions. No resp. distress. No accessory muscle use. ABD: S, NT, ND, +BS. No rebound. No HSM. EXTR: No c/c/e PSYCH: Normally interactive. Conversant.    EKG: NSR, WNL  Assessment and Plan: Acquired hypothyroidism  Localized osteoporosis without current pathological fracture  Primary renal cell carcinoma of left  kidney (HCC)  Malignant neoplasm of upper-outer quadrant of right breast in female, estrogen receptor positive (HCC)  Mixed hyperlipidemia  Assessment & Plan Malignant neoplasm of left kidney, status post nephrectomy, on surveillance Currently on surveillance post-nephrectomy for renal cell carcinoma. Recent CT scan showed no suspicious findings. - Continue surveillance with CT scans every six months for two years, then consider spacing out the scans.  Fatigue, under evaluation Reports significant fatigue post-exertion. Previous  echocardiogram showed grade 1 diastolic dysfunction. Differential includes thyroid  dysfunction and potential cardiac or pulmonary issues. Discussed coronary artery disease and CT coronary calcium scan utility. - Check TSH levels to assess thyroid  function, B12, ferritin - Order CT coronary calcium scan to evaluate for coronary artery disease. - EKG today is reassuring.  Hypothyroidism Thyroid  function was normal in May but has been high occasionally. Fatigue may relate to suboptimal thyroid  function. - Check TSH levels and consider adjusting levothyroxine  dosage if TSH is towards the upper end of the normal range.  Localized osteoporosis, on bisphosphonate infusions Receiving bisphosphonate infusions every six months. Experiences bone pain post-infusion, likely related to treatment. Discussed using Tylenol  for pain management, with ibuprofen  generally avoided due to single kidney status. - Consider using Tylenol  to manage post-infusion pain. - Discussed that occasional use of ibuprofen  may be acceptable despite single kidney status.  Signed Harlene Schroeder, MD  Received her labs, message to patient Results for orders placed or performed in visit on 11/29/23  TSH   Collection Time: 11/29/23 10:31 AM  Result Value Ref Range   TSH 3.26 0.35 - 5.50 uIU/mL  Ferritin   Collection Time: 11/29/23 10:31 AM  Result Value Ref Range   Ferritin 66.3 10.0 - 291.0  ng/mL  B12   Collection Time: 11/29/23 10:31 AM  Result Value Ref Range   Vitamin B-12 989 (H) 211 - 911 pg/mL  Basic metabolic panel with GFR   Collection Time: 11/29/23 10:31 AM  Result Value Ref Range   Sodium 139 135 - 145 mEq/L   Potassium 4.8 3.5 - 5.1 mEq/L   Chloride 100 96 - 112 mEq/L   CO2 28 19 - 32 mEq/L   Glucose, Bld 89 70 - 99 mg/dL   BUN 12 6 - 23 mg/dL   Creatinine, Ser 8.98 0.40 - 1.20 mg/dL   GFR 37.53 >39.99 mL/min   Calcium 9.8 8.4 - 10.5 mg/dL

## 2023-11-29 ENCOUNTER — Encounter: Payer: Self-pay | Admitting: Family Medicine

## 2023-11-29 ENCOUNTER — Ambulatory Visit: Admitting: Family Medicine

## 2023-11-29 VITALS — BP 132/84 | HR 74 | Ht 60.0 in | Wt 119.6 lb

## 2023-11-29 DIAGNOSIS — R252 Cramp and spasm: Secondary | ICD-10-CM

## 2023-11-29 DIAGNOSIS — R6889 Other general symptoms and signs: Secondary | ICD-10-CM

## 2023-11-29 DIAGNOSIS — E039 Hypothyroidism, unspecified: Secondary | ICD-10-CM | POA: Diagnosis not present

## 2023-11-29 DIAGNOSIS — C642 Malignant neoplasm of left kidney, except renal pelvis: Secondary | ICD-10-CM

## 2023-11-29 DIAGNOSIS — C50411 Malignant neoplasm of upper-outer quadrant of right female breast: Secondary | ICD-10-CM | POA: Diagnosis not present

## 2023-11-29 DIAGNOSIS — E782 Mixed hyperlipidemia: Secondary | ICD-10-CM

## 2023-11-29 DIAGNOSIS — Z170 Estrogen receptor positive status [ER+]: Secondary | ICD-10-CM

## 2023-11-29 DIAGNOSIS — M816 Localized osteoporosis [Lequesne]: Secondary | ICD-10-CM | POA: Diagnosis not present

## 2023-11-29 LAB — BASIC METABOLIC PANEL WITH GFR
BUN: 12 mg/dL (ref 6–23)
CO2: 28 meq/L (ref 19–32)
Calcium: 9.8 mg/dL (ref 8.4–10.5)
Chloride: 100 meq/L (ref 96–112)
Creatinine, Ser: 1.01 mg/dL (ref 0.40–1.20)
GFR: 62.46 mL/min (ref >60.00)
Glucose, Bld: 89 mg/dL (ref 70–99)
Potassium: 4.8 meq/L (ref 3.5–5.1)
Sodium: 139 meq/L (ref 135–145)

## 2023-11-29 LAB — FERRITIN: Ferritin: 66.3 ng/mL (ref 10.0–291.0)

## 2023-11-29 LAB — TSH: TSH: 3.26 u[IU]/mL (ref 0.35–5.50)

## 2023-11-29 LAB — VITAMIN B12: Vitamin B-12: 989 pg/mL — ABNORMAL HIGH (ref 211–911)

## 2023-11-29 NOTE — Patient Instructions (Addendum)
 Good to see you!   Recommend flu shot and covid booster this fall Please stop by imaging on the ground floor and set up your CT coronary screening I will be in touch with your labs- will check for any iron or B12 deficiency If we need to we can also up your thyroid  dosage

## 2023-12-02 ENCOUNTER — Ambulatory Visit (HOSPITAL_BASED_OUTPATIENT_CLINIC_OR_DEPARTMENT_OTHER)
Admission: RE | Admit: 2023-12-02 | Discharge: 2023-12-02 | Disposition: A | Discharge status: Home / Self Care | Payer: Self-pay | Source: Ambulatory Visit | Attending: Family Medicine | Admitting: Family Medicine

## 2023-12-02 DIAGNOSIS — R6889 Other general symptoms and signs: Secondary | ICD-10-CM | POA: Insufficient documentation

## 2023-12-03 ENCOUNTER — Encounter: Payer: Self-pay | Admitting: Family Medicine

## 2023-12-06 ENCOUNTER — Ambulatory Visit: Admitting: Family Medicine

## 2024-01-25 ENCOUNTER — Other Ambulatory Visit: Payer: Self-pay | Admitting: Family Medicine

## 2024-01-25 DIAGNOSIS — I10 Essential (primary) hypertension: Secondary | ICD-10-CM

## 2024-01-25 DIAGNOSIS — R000 Tachycardia, unspecified: Secondary | ICD-10-CM

## 2024-01-31 ENCOUNTER — Ambulatory Visit: Attending: Surgery

## 2024-01-31 VITALS — Wt 121.0 lb

## 2024-01-31 DIAGNOSIS — Z483 Aftercare following surgery for neoplasm: Secondary | ICD-10-CM | POA: Insufficient documentation

## 2024-01-31 NOTE — Therapy (Signed)
 OUTPATIENT PHYSICAL THERAPY SOZO SCREENING NOTE   Patient Name: Dana Kramer MRN: [id number] DOB:December 03, 1965, 58 y.o., female Today's Date: 01/31/2024  PCP: Watt Harlene BROCKS, MD REFERRING PROVIDER: Vanderbilt Ned, MD   Dana End of Session - 01/31/24 [phone]     Visit Number 12   # unchanged due to screen only   Dana Start Time 0935    Dana Stop Time 0939    Dana Time Calculation (min) 4 min    Activity Tolerance Patient tolerated treatment well    Behavior During Therapy [facility] for tasks assessed/performed          Past Medical History:  Diagnosis Date   Allergy 11/1998   Penicillin   Anemia    Chicken pox    History of transfusion of whole blood    Hyperlipidemia    Hypertension    Hypothyroidism    Malignant neoplasm of upper-outer quadrant of right breast in female, estrogen receptor positive (HCC) 01/13/2021   Migraine    Pre-diabetes    Thyroid  disease    Hypothyroidism   Past Surgical History:  Procedure Laterality Date   ABDOMINOPLASTY  10/01/2010   CESAREAN SECTION     twice   HERNIA REPAIR     PORT-A-CATH REMOVAL     PORTACATH PLACEMENT Right 01/23/2021   Procedure: Right internal jugular 8 [place] PowerPort with C arm and ultrasound guidance;  Surgeon: Vanderbilt Ned, MD;  Location: [facility];  Service: General;  Laterality: Right;   ROBOT ASSISTED LAPAROSCOPIC NEPHRECTOMY Left 01/07/2023   Procedure: XI ROBOTIC ASSISTED LAPAROSCOPIC NEPHRECTOMY WITH TAP BLOCK;  Surgeon: Cam Morene ORN, MD;  Location: [facility];  Service: Urology;  Laterality: Left;   SENTINEL NODE BIOPSY Right 01/23/2021   Procedure: Right axillary sentinel lymph node mapping using Mag Trace;  Surgeon: Vanderbilt Ned, MD;  Location: [facility];  Service: General;  Laterality: Right;   SIMPLE MASTECTOMY WITH AXILLARY SENTINEL NODE BIOPSY Bilateral 01/23/2021   Procedure: Right simple mastectomy ;  Left risk reducing simple mastectomy;  Surgeon: Vanderbilt Ned, MD;  Location: [facility];  Service: General;   Laterality: Bilateral;   TUBAL LIGATION     umblica hernia repair  10/01/2010   Patient Active Problem List   Diagnosis Date Noted   Osteoporosis 04/12/2023   Primary renal cell carcinoma of left kidney (HCC) 01/19/2023   Left renal mass 01/07/2023   Port-A-Cath in place 03/04/2021   Genetic testing 01/27/2021   S/P mastectomy, bilateral 01/23/2021   Family history of colon cancer 01/15/2021   Malignant neoplasm of upper-outer quadrant of right breast in female, estrogen receptor positive (HCC) 01/13/2021   Pre-diabetes 11/06/2020   Acquired hypothyroidism 04/14/2018   Migraine 01/18/2015   Insomnia 11/05/2011   Anxiety 11/05/2011   Hyperlipidemia 11/05/2011   Tachycardia 11/05/2011   Umbilical hernia 11/19/2010    REFERRING DIAG: right breast cancer at risk for lymphedema  THERAPY DIAG: Aftercare following surgery for neoplasm   PERTINENT HISTORY:  Patient was diagnosed on 12/10/2020 with right grade II invasive ductal carcinoma breast cancer. She underwent a bilateral mastectomy with a right sentinel node biopsy (4 negative nodes) on 01/23/2021. It is ER/PR positive and HER2 negative with a Ki67 of 10%.  Currently on herceptin  q3weeks and tamoxifen ; 12/2022 diagnosed renal cancer and had the Lt kidney removed  PRECAUTIONS: right UE Lymphedema risk,   SUBJECTIVE: Dana Kramer for her 6 month L-Dex screen.   PAIN: No  SOZO SCREENING: Patient was assessed today using the SOZO machine to  determine the lymphedema index score. This was compared to her baseline score. It was determined that she is within the recommended range when compared to her baseline and no further action is needed at this time. She will continue SOZO screenings. These are done every 3 months for 2 years post operatively followed by every 6 months for 2 years, and then annually.  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     L-DEX FLOWSHEETS - 01/31/24 0900       L-DEX LYMPHEDEMA SCREENING   Measurement Type Unilateral    L-DEX MEASUREMENT EXTREMITY Upper Extremity    POSITION  Standing    DOMINANT SIDE Right    At Risk Side Right    BASELINE SCORE (UNILATERAL) -4.5    L-DEX SCORE (UNILATERAL) -1.3    VALUE CHANGE (UNILAT) 3.2         P: Cont every 6 months L-Dex screens until 01/2025 then can transition to annual.   Aden Berwyn Caldron, PTA 01/31/2024, 9:41 AM

## 2024-02-01 LAB — HM COLONOSCOPY

## 2024-02-18 ENCOUNTER — Other Ambulatory Visit: Payer: Self-pay

## 2024-02-18 DIAGNOSIS — C50411 Malignant neoplasm of upper-outer quadrant of right female breast: Secondary | ICD-10-CM

## 2024-02-20 NOTE — Assessment & Plan Note (Signed)
 Stage IA, c(T1bN0M0), Triple positive, Grade 2 -found on screening mammogram. 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 -S/p b/l mastectomies on 01/23/21 with Dr. Vanderbilt. 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 Port was placed during surgery -s/p weekly Abraxane /Herceptin  x12 02/25/21 - 05/12/21, -she completed one year maintenance herceptin  injection in Jan 2024 -she began tamoxifen  on 06/20/21. She is tolerating well overall. She notes she has not had a period since completing chemo.  -FSH 33 in 11/2023, will repeat in 2026

## 2024-02-21 ENCOUNTER — Ambulatory Visit

## 2024-02-21 ENCOUNTER — Inpatient Hospital Stay: Admitting: Hematology

## 2024-02-21 ENCOUNTER — Inpatient Hospital Stay: Attending: Hematology

## 2024-02-21 VITALS — BP 130/79 | HR 79 | Temp 97.7°F | Resp 17

## 2024-02-21 VITALS — BP 129/85 | HR 88 | Temp 98.3°F | Resp 17 | Wt 122.1 lb

## 2024-02-21 DIAGNOSIS — C50411 Malignant neoplasm of upper-outer quadrant of right female breast: Secondary | ICD-10-CM | POA: Diagnosis not present

## 2024-02-21 DIAGNOSIS — Z170 Estrogen receptor positive status [ER+]: Secondary | ICD-10-CM | POA: Diagnosis not present

## 2024-02-21 DIAGNOSIS — Z95828 Presence of other vascular implants and grafts: Secondary | ICD-10-CM

## 2024-02-21 DIAGNOSIS — M810 Age-related osteoporosis without current pathological fracture: Secondary | ICD-10-CM | POA: Insufficient documentation

## 2024-02-21 DIAGNOSIS — Z79810 Long term (current) use of selective estrogen receptor modulators (SERMs): Secondary | ICD-10-CM | POA: Diagnosis not present

## 2024-02-21 DIAGNOSIS — E790 Hyperuricemia without signs of inflammatory arthritis and tophaceous disease: Secondary | ICD-10-CM | POA: Diagnosis not present

## 2024-02-21 DIAGNOSIS — Z79899 Other long term (current) drug therapy: Secondary | ICD-10-CM | POA: Diagnosis not present

## 2024-02-21 LAB — CBC WITH DIFFERENTIAL (CANCER CENTER ONLY)
Abs Immature Granulocytes: 0.01 K/uL (ref 0.00–0.07)
Basophils Absolute: 0 K/uL (ref 0.0–0.1)
Basophils Relative: 1 %
Eosinophils Absolute: 0.2 K/uL (ref 0.0–0.5)
Eosinophils Relative: 4 %
HCT: 35.8 % — ABNORMAL LOW (ref 36.0–46.0)
Hemoglobin: 12.2 g/dL (ref 12.0–15.0)
Immature Granulocytes: 0 %
Lymphocytes Relative: 44 %
Lymphs Abs: 2.3 K/uL (ref 0.7–4.0)
MCH: 28.6 pg (ref 26.0–34.0)
MCHC: 34.1 g/dL (ref 30.0–36.0)
MCV: 84 fL (ref 80.0–100.0)
Monocytes Absolute: 0.3 K/uL (ref 0.1–1.0)
Monocytes Relative: 5 %
Neutro Abs: 2.4 K/uL (ref 1.7–7.7)
Neutrophils Relative %: 46 %
Platelet Count: 191 K/uL (ref 150–400)
RBC: 4.26 MIL/uL (ref 3.87–5.11)
RDW: 11.8 % (ref 11.5–15.5)
WBC Count: 5.1 K/uL (ref 4.0–10.5)
nRBC: 0 % (ref 0.0–0.2)

## 2024-02-21 LAB — CMP (CANCER CENTER ONLY)
ALT: 10 U/L (ref 0–44)
AST: 28 U/L (ref 15–41)
Albumin: 4.6 g/dL (ref 3.5–5.0)
Alkaline Phosphatase: 41 U/L (ref 38–126)
Anion gap: 11 (ref 5–15)
BUN: 12 mg/dL (ref 6–20)
CO2: 23 mmol/L (ref 22–32)
Calcium: 9.6 mg/dL (ref 8.9–10.3)
Chloride: 107 mmol/L (ref 98–111)
Creatinine: 0.9 mg/dL (ref 0.44–1.00)
GFR, Estimated: >60 mL/min (ref >60)
Glucose, Bld: 144 mg/dL — ABNORMAL HIGH (ref 70–99)
Potassium: 3.7 mmol/L (ref 3.5–5.1)
Sodium: 142 mmol/L (ref 135–145)
Total Bilirubin: 0.2 mg/dL (ref 0.0–1.2)
Total Protein: 7.5 g/dL (ref 6.5–8.1)

## 2024-02-21 LAB — URIC ACID: Uric Acid, Serum: 5.1 mg/dL (ref 2.5–7.1)

## 2024-02-21 MED ORDER — ZOLEDRONIC ACID 4 MG/100ML IV SOLN
4.0000 mg | Freq: Once | INTRAVENOUS | Status: AC
  Administered 2024-02-21: 12:00:00 4 mg via INTRAVENOUS
  Filled 2024-02-21: qty 100

## 2024-02-21 MED ORDER — SODIUM CHLORIDE 0.9 % IV SOLN: Freq: Once | INTRAVENOUS | Status: AC

## 2024-02-21 NOTE — Progress Notes (Signed)
 [facility]   [phone] [phone]   Clinic Follow up Note   Patient Care Team: Copland, Harlene BROCKS, MD as PCP - General (Family Medicine) Vanderbilt Ned, MD as Consulting Physician (General Surgery) Lanny Callander, MD as Consulting Physician (Hematology) Izell Domino, MD as Attending Physician (Radiation Oncology) Tyree Nanetta SAILOR, RN as Oncology Nurse Navigator Bensimhon, Toribio SAUNDERS, MD as Consulting Physician (Cardiology) Hanford Powell BRAVO, NP as Nurse Practitioner (Hematology and Oncology) Cam Morene ORN, MD as Attending Physician (Urology)  Date of Service:  02/21/2024  CHIEF COMPLAINT: f/u of right breast cancer  CURRENT THERAPY:  Tamoxifen  20 mg daily Zometa  every 6 months  Oncology History   Malignant neoplasm of upper-outer quadrant of right breast in female, estrogen receptor positive (HCC) Stage IA, c(T1bN0M0), Triple positive, Grade 2 -found on screening mammogram. 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 -S/p b/l mastectomies on 01/23/21 with Dr. Vanderbilt. 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 Port was placed during surgery -s/p weekly Abraxane /Herceptin  x12 02/25/21 - 05/12/21, -she completed one year maintenance herceptin  injection in Jan 2024 -she began tamoxifen  on 06/20/21. She is tolerating well overall. She notes she has not had a period since completing chemo.  -FSH 33 in 11/2023, will repeat in 2026   Assessment & Plan Estrogen receptor positive breast cancer status post double mastectomy Estrogen receptor positive breast cancer, status post double mastectomy, currently on tamoxifen . She continues to tolerate tamoxifen  without adverse effects. Persistent chest wall numbness is non-painful and unchanged. - Reviewed recent blood counts (normal), FSH (perimenopausal), and CT scan (unremarkable). - Continued tamoxifen  therapy. - Scheduled follow-up in six  months.  History of kidney cancer History of kidney cancer. She is not under active urology surveillance but has had appropriate imaging follow-up.  Hyperuricemia Recent episode of hyperuricemia during travel, likely dietary, managed with medication in [place]. Uric acid normalized prior to return. No history of gout. She wishes to monitor uric acid locally. - Attempted to add uric acid to current laboratory testing; will contact phlebotomy to confirm if sample can be added. - If unable to add on, discussed option to redraw blood today.  Osteoporosis T-score -3.2 on prior bone density scan. She is receiving antiresorptive therapy (Zemaira), with four planned treatments; two completed, third scheduled today, fourth in six months. No dental issues. Repeat bone density testing due in approximately two years. - Confirmed Zemaira infusion scheduled for today and next infusion in six months. - Planned repeat bone density scan at end of 2026.  Plan - She is clinically doing well, exam was unremarkable. - Proceed to stat dose of Zometa  today - Lab, follow-up and last dose Zometa  in 6 months   SUMMARY OF ONCOLOGIC HISTORY: Oncology History Overview Note   Cancer Staging  Malignant neoplasm of upper-outer quadrant of right breast in female, estrogen receptor positive (HCC) Staging form: Breast, AJCC 8th Edition - Clinical stage from 01/08/2021: Stage IA (cT1b, cN0, cM0, G2, ER+, PR+, HER2+) - Signed by Lanny Callander, MD on 01/15/2021 - Pathologic stage from 01/23/2021: Stage IA (pT1c, pN0, cM0, G2, ER+, PR+, HER2+) - Signed by Lanny Callander, MD on 02/13/2021     Malignant neoplasm of upper-outer quadrant of right breast in female, estrogen receptor positive (HCC)  01/02/2021 Mammogram   EXAM: DIGITAL DIAGNOSTIC BILATERAL MAMMOGRAM WITH TOMOSYNTHESIS AND CAD; ULTRASOUND LEFT BREAST LIMITED; ULTRASOUND RIGHT BREAST LIMITED  IMPRESSION: 1. Highly suspicious 0.7 cm UPPER-OUTER RIGHT breast mass with  associated suspicious calcifications extending 1.5 cm anteriorly. Tissue sampling is recommended. 2. No abnormal appearing RIGHT axillary lymph nodes. 3. Benign LOWER LEFT breast cyst corresponding to the LEFT breast screening study finding.   01/08/2021 Cancer Staging   Staging form: Breast, AJCC 8th Edition - Clinical stage from 01/08/2021: Stage IA (cT1b, cN0, cM0, G2, ER+, PR+, HER2+) - Signed by Lanny Callander, MD on 01/15/2021 Stage prefix: Initial diagnosis Nuclear grade: G2 Histologic grading system: 3 grade system   01/08/2021 Pathology Results   Diagnosis Breast, right, needle core biopsy, upper outer quadrant, 11 o'clock, 5cmfn, ribbon clip - INVASIVE DUCTAL CARCINOMA - DUCTAL CARCINOMA IN SITU WITH CALCIFICATIONS - SEE COMMENT Microscopic Comment Based on the biopsy, the carcinoma appears Nottingham grade 2 of 3 and measures 0.6 cm in greatest linear extent.  PROGNOSTIC INDICATORS Results: The tumor cells are 2+ for Her2 (EQUIVOCAL). Her2 by FISH will be performed and results reported separately. Estrogen Receptor: 100%, POSITIVE, STRONG STAINING INTENSITY Progesterone Receptor: 70%, POSITIVE, STRONG STAINING INTENSITY Proliferation Marker Ki67: 10%  FLUORESCENCE IN-SITU HYBRIDIZATION Results: GROUP 1: HER2 **POSITIVE**   01/13/2021 Initial Diagnosis   Malignant neoplasm of upper-outer quadrant of right breast in female, estrogen receptor positive (HCC)   01/15/2021 Genetic Testing   [facility] CustomNext Panel was Negative. Report date is 01/26/2021.  The CustomNext-Cancer+RNAinsight panel offered by [employer] includes sequencing and rearrangement analysis for the following 47 genes:  APC, ATM, AXIN2, BARD1, BMPR1A, BRCA1, BRCA2, BRIP1, CDH1, CDK4, CDKN2A, CHEK2, CTNNA1, DICER1, EPCAM, GREM1, HOXB13, KIT, MEN1, MLH1, MSH2, MSH3, MSH6, MUTYH, NBN, NF1, NTHL1, PALB2, PDGFRA, PMS2, POLD1, POLE, PTEN, RAD50, RAD51C, RAD51D, SDHA, SDHB, SDHC, SDHD, SMAD4, SMARCA4, STK11, TP53, TSC1,  TSC2, and VHL.  RNA data is routinely analyzed for use in variant interpretation for all genes.   01/23/2021 Cancer Staging   Staging form: Breast, AJCC 8th Edition - Pathologic stage from 01/23/2021: Stage IA (pT1c, pN0, cM0, G2, ER+, PR+, HER2+) - Signed by Lanny Callander, MD on 02/13/2021 Histologic grading system: 3 grade system Residual tumor (R): R0 - None   01/23/2021 Definitive Surgery   FINAL MICROSCOPIC DIAGNOSIS:   A. BREAST, LEFT, MASTECTOMY:  - Fibrocystic changes with sclerosing adenosis and calcifications.  - Fibroadenoma (0.6 cm).  - No evidence of malignancy.   B. BREAST, RIGHT, MASTECTOMY:  - Invasive and in situ ductal carcinoma, 1.7 cm.  - Margins negative for carcinoma.  - Biopsy site and biopsy clip.  - See oncology table.   C. LYMPH NODE, RIGHT AXILLARY, SENTINEL, EXCISION:  - One lymph node negative for metastatic carcinoma (0/1).   D. LYMPH NODE, RIGHT AXILLARY, SENTINEL, EXCISION:  - One lymph node negative for metastatic carcinoma (0/1).   E. LYMPH NODE, RIGHT AXILLARY, SENTINEL, EXCISION:  - One lymph node negative for metastatic carcinoma (0/1).   F. LYMPH NODE, RIGHT AXILLARY, SENTINEL, EXCISION:  - One lymph node negative for metastatic carcinoma (0/1).    02/25/2021 - 10/22/2021 Chemotherapy   Patient is on Treatment Plan : BREAST Paclitaxel  + Trastuzumab  q7d / Trastuzumab  q21d     02/25/2021 - 04/08/2022 Chemotherapy   Patient is on Treatment Plan : BREAST Paclitaxel  + Trastuzumab  q7d / Trastuzumab  q21d     Primary renal cell carcinoma of left kidney (HCC)  01/07/2023 Cancer Staging   Staging form: Kidney, AJCC 8th Edition - Pathologic stage from 01/07/2023: Stage I (pT1b, pN0, cM0) - Signed by Lanny Callander, MD on 01/19/2023 Histologic grade (G): G3 Histologic grading system: 4 grade system Residual tumor (  R): R0 - None   01/19/2023 Initial Diagnosis   Primary renal cell carcinoma of left kidney [facility])      Discussed the use of AI scribe  software for clinical note transcription with the patient, who gave verbal consent to proceed.  History of Present Illness Dana Kramer is a 58 year old female with estrogen receptor positive breast cancer, kidney cancer, and osteopenia who presents for oncology follow-up and scheduled Zemaira infusion.  She is status post double mastectomy for breast cancer and remains on tamoxifen  without adverse effects. She has persistent chest wall numbness but no pain, new masses, abnormal bleeding, or other breast symptoms.  She had kidney cancer and a CT scan in September 2025 that was reported as normal by her urologist. She has no kidney-related symptoms and has not needed further urologic follow-up.  She has osteopenia with low bone density and is on antiresorptive therapy with Zemaira, with two prior doses completed. She is receiving her third infusion today, with the next planned in six months. She has mild back discomfort but no other musculoskeletal pain and no dental issues.  During recent travel to [place] and [place] she developed flu-like symptoms and transiently elevated uric acid that normalized with medication. She has no prior gout and believes this was diet related. She is interested in monitoring uric acid locally and is agreeable to additional blood draws.  She has no abdominal pain, chest pain, shortness of breath, or other new symptoms. She attends regular dental cleanings and has no current dental concerns.     All other systems were reviewed with the patient and are negative.  MEDICAL HISTORY:  Past Medical History:  Diagnosis Date   Allergy 11/1998   Penicillin   Anemia    Chicken pox    History of transfusion of whole blood    Hyperlipidemia    Hypertension    Hypothyroidism    Malignant neoplasm of upper-outer quadrant of right breast in female, estrogen receptor positive (HCC) 01/13/2021   Migraine    Pre-diabetes    Thyroid  disease    Hypothyroidism    SURGICAL  HISTORY: Past Surgical History:  Procedure Laterality Date   ABDOMINOPLASTY  10/01/2010   CESAREAN SECTION     twice   HERNIA REPAIR     PORT-A-CATH REMOVAL     PORTACATH PLACEMENT Right 01/23/2021   Procedure: Right internal jugular 8 French PowerPort with C arm and ultrasound guidance;  Surgeon: Vanderbilt Ned, MD;  Location: [facility] OR;  Service: General;  Laterality: Right;   ROBOT ASSISTED LAPAROSCOPIC NEPHRECTOMY Left 01/07/2023   Procedure: XI ROBOTIC ASSISTED LAPAROSCOPIC NEPHRECTOMY WITH TAP BLOCK;  Surgeon: Cam Morene ORN, MD;  Location: [facility] ORS;  Service: Urology;  Laterality: Left;   SENTINEL NODE BIOPSY Right 01/23/2021   Procedure: Right axillary sentinel lymph node mapping using Mag Trace;  Surgeon: Vanderbilt Ned, MD;  Location: [facility] OR;  Service: General;  Laterality: Right;   SIMPLE MASTECTOMY WITH AXILLARY SENTINEL NODE BIOPSY Bilateral 01/23/2021   Procedure: Right simple mastectomy ;  Left risk reducing simple mastectomy;  Surgeon: Vanderbilt Ned, MD;  Location: [facility] OR;  Service: General;  Laterality: Bilateral;   TUBAL LIGATION     umblica hernia repair  10/01/2010    I have reviewed the social history and family history with the patient and they are unchanged from previous note.  ALLERGIES:  is allergic to betadine [povidone iodine] and penicillins.  MEDICATIONS:  Current Outpatient Medications  Medication Sig Dispense Refill   amLODipine  (  NORVASC ) 5 MG tablet TAKE 1 TABLET(5 MG) BY MOUTH DAILY 90 tablet 3   b complex vitamins capsule Take 1 capsule by mouth daily.     Calcium Citrate-Vitamin D  (CALCIUM + D PO) Take 1 tablet by mouth daily.     Cholecalciferol (VITAMIN D  PO) Take 2,000 Units by mouth daily.     levothyroxine  (SYNTHROID ) 50 MCG tablet Take 1 tablet (50 mcg total) by mouth daily before breakfast. 90 tablet 3   melatonin 5 MG TABS Take 5 mg by mouth at bedtime as needed (Sleep).     metoprolol  succinate (TOPROL -XL) 50 MG 24 hr tablet Take 1 tablet  (50 mg total) by mouth daily. Take with or immediately following a meal 90 tablet 1   Multiple Vitamins-Minerals (WOMENS MULTIVITAMIN PO) Take 1 tablet by mouth daily.     ondansetron  (ZOFRAN ) 4 MG tablet Take 1 tablet (4 mg total) by mouth every 8 (eight) hours as needed for nausea or vomiting. 30 tablet 2   simvastatin  (ZOCOR ) 20 MG tablet TAKE 1 TABLET(20 MG) BY MOUTH AT BEDTIME 90 tablet 3   tamoxifen  (NOLVADEX ) 20 MG tablet TAKE 1 TABLET(20 MG) BY MOUTH DAILY 90 tablet 3   traZODone  (DESYREL ) 50 MG tablet TAKE 1/2 TO 1 TABLET(25 TO 50 MG) BY MOUTH AT BEDTIME AS NEEDED FOR SLEEP 90 tablet 3   No current facility-administered medications for this visit.    PHYSICAL EXAMINATION: ECOG PERFORMANCE STATUS: 0 - Asymptomatic  Vitals:   02/21/24 1044 02/21/24 1045  BP: (!) 141/86 129/85  Pulse: 88   Resp: 17   Temp: 98.3 F (36.8 C)   SpO2: 100%    Wt Readings from Last 3 Encounters:  02/21/24 122 lb 1.6 oz (55.4 kg)  01/31/24 121 lb (54.9 kg)  11/29/23 119 lb 9.6 oz (54.3 kg)     GENERAL:alert, no distress and comfortable SKIN: skin color, texture, turgor are normal, no rashes or significant lesions EYES: normal, Conjunctiva are pink and non-injected, sclera clear NECK: supple, thyroid  normal size, non-tender, without nodularity LYMPH:  no palpable lymphadenopathy in the cervical, axillary  LUNGS: clear to auscultation and percussion with normal breathing effort HEART: regular rate & rhythm and no murmurs and no lower extremity edema ABDOMEN:abdomen soft, non-tender and normal bowel sounds Musculoskeletal:no cyanosis of digits and no clubbing  NEURO: alert & oriented x 3 with fluent speech, no focal motor/sensory deficits Breasts: Status post bilateral mastectomy, no palpable nodule on chest wall.  No palpable adenopathy. Physical Exam   LABORATORY DATA:  I have reviewed the data as listed    Latest Ref Rng & Units 02/21/2024   10:36 AM 11/18/2023   10:31 AM 08/23/2023     8:50 AM  CBC  WBC 4.0 - 10.5 K/uL 5.1  4.8  5.5   Hemoglobin 12.0 - 15.0 g/dL 87.7  88.3  88.0   Hematocrit 36.0 - 46.0 % 35.8  34.4  34.4   Platelets 150 - 400 K/uL 191  176  197         Latest Ref Rng & Units 02/21/2024   10:36 AM 11/29/2023   10:31 AM 11/18/2023   10:31 AM  CMP  Glucose 70 - 99 mg/dL 855  89  77   BUN 6 - 20 mg/dL 12  12  9    Creatinine 0.44 - 1.00 mg/dL 9.09  8.98  9.01   Sodium 135 - 145 mmol/L 142  139  139   Potassium 3.5 - 5.1 mmol/L  3.7  4.8  4.8   Chloride 98 - 111 mmol/L 107  100  107   CO2 22 - 32 mmol/L 23  28  27    Calcium 8.9 - 10.3 mg/dL 9.6  9.8  8.8   Total Protein 6.5 - 8.1 g/dL 7.5   6.8   Total Bilirubin 0.0 - 1.2 mg/dL 0.2   0.2   Alkaline Phos 38 - 126 U/L 41   36   AST 15 - 41 U/L 28   23   ALT 0 - 44 U/L 10   10       RADIOGRAPHIC STUDIES: I have personally reviewed the radiological images as listed and agreed with the findings in the report. No results found.    Orders Placed This Encounter  Procedures   Uric acid    Please add to CMP today    Standing Status:   Future    Number of Occurrences:   1    Expected Date:   02/21/2024    Expiration Date:   02/20/2025   All questions were answered. The patient knows to call the clinic with any problems, questions or concerns. No barriers to learning was detected. The total time spent in the appointment was 25 minutes, including review of chart and various tests results, discussions about plan of care and coordination of care plan     Onita Mattock, MD 02/21/2024

## 2024-02-21 NOTE — Patient Instructions (Signed)

## 2024-04-06 ENCOUNTER — Encounter: Payer: Self-pay | Admitting: Hematology

## 2024-04-12 ENCOUNTER — Other Ambulatory Visit: Payer: Self-pay | Admitting: Family Medicine

## 2024-04-12 DIAGNOSIS — F5102 Adjustment insomnia: Secondary | ICD-10-CM

## 2024-05-15 ENCOUNTER — Ambulatory Visit: Admitting: Hematology

## 2024-05-15 ENCOUNTER — Other Ambulatory Visit

## 2024-05-29 ENCOUNTER — Ambulatory Visit: Admitting: Family Medicine

## 2024-07-18 ENCOUNTER — Ambulatory Visit: Payer: Self-pay

## 2024-08-21 ENCOUNTER — Inpatient Hospital Stay: Admitting: Hematology

## 2024-08-21 ENCOUNTER — Inpatient Hospital Stay
